# Patient Record
Sex: Male | Born: 1940
Health system: Southern US, Community
[De-identification: ages and names within clinical notes are randomized; demographics above are authoritative.]

## PROBLEM LIST (undated history)

## (undated) DIAGNOSIS — Z87442 Personal history of urinary calculi: Secondary | ICD-10-CM

## (undated) DIAGNOSIS — I4819 Other persistent atrial fibrillation: Secondary | ICD-10-CM

## (undated) DIAGNOSIS — M1990 Unspecified osteoarthritis, unspecified site: Secondary | ICD-10-CM

## (undated) DIAGNOSIS — C9210 Chronic myeloid leukemia, BCR/ABL-positive, not having achieved remission: Secondary | ICD-10-CM

## (undated) DIAGNOSIS — I499 Cardiac arrhythmia, unspecified: Secondary | ICD-10-CM

## (undated) DIAGNOSIS — I10 Essential (primary) hypertension: Secondary | ICD-10-CM

## (undated) DIAGNOSIS — N200 Calculus of kidney: Secondary | ICD-10-CM

## (undated) HISTORY — DX: Other persistent atrial fibrillation: I48.19

## (undated) HISTORY — PX: OTHER SURGICAL HISTORY: SHX169

## (undated) HISTORY — PX: KNEE ARTHROSCOPY: SUR90

## (undated) HISTORY — DX: Essential (primary) hypertension: I10

## (undated) HISTORY — PX: COLONOSCOPY: SHX174

## (undated) HISTORY — DX: Chronic myeloid leukemia, BCR/ABL-positive, not having achieved remission: C92.10

---

## 2002-07-26 ENCOUNTER — Encounter: Payer: Self-pay | Admitting: Emergency Medicine

## 2002-07-26 ENCOUNTER — Emergency Department (HOSPITAL_COMMUNITY): Admission: EM | Admit: 2002-07-26 | Discharge: 2002-07-27 | Payer: Self-pay | Admitting: Emergency Medicine

## 2005-05-18 ENCOUNTER — Ambulatory Visit: Payer: Self-pay | Admitting: Internal Medicine

## 2005-05-29 ENCOUNTER — Encounter (INDEPENDENT_AMBULATORY_CARE_PROVIDER_SITE_OTHER): Payer: Self-pay | Admitting: Specialist

## 2005-05-29 ENCOUNTER — Ambulatory Visit: Payer: Self-pay | Admitting: Internal Medicine

## 2008-06-27 ENCOUNTER — Ambulatory Visit: Payer: Self-pay | Admitting: Internal Medicine

## 2008-07-10 ENCOUNTER — Encounter: Payer: Self-pay | Admitting: Internal Medicine

## 2008-07-10 ENCOUNTER — Ambulatory Visit: Payer: Self-pay | Admitting: Internal Medicine

## 2008-07-11 ENCOUNTER — Encounter: Payer: Self-pay | Admitting: Internal Medicine

## 2010-05-04 ENCOUNTER — Encounter: Admission: RE | Admit: 2010-05-04 | Discharge: 2010-05-04 | Payer: Self-pay | Admitting: Orthopedic Surgery

## 2013-06-28 ENCOUNTER — Encounter: Payer: Self-pay | Admitting: Gastroenterology

## 2013-11-16 ENCOUNTER — Encounter: Payer: Self-pay | Admitting: Internal Medicine

## 2013-12-16 ENCOUNTER — Inpatient Hospital Stay (HOSPITAL_COMMUNITY)
Admission: EM | Admit: 2013-12-16 | Discharge: 2013-12-20 | DRG: 287 | Disposition: A | Discharge status: Home / Self Care | Payer: Medicare HMO | Attending: Cardiology | Admitting: Cardiology

## 2013-12-16 ENCOUNTER — Emergency Department (HOSPITAL_COMMUNITY): Payer: Medicare HMO

## 2013-12-16 ENCOUNTER — Ambulatory Visit (INDEPENDENT_AMBULATORY_CARE_PROVIDER_SITE_OTHER): Payer: Medicare HMO | Admitting: Internal Medicine

## 2013-12-16 ENCOUNTER — Encounter (HOSPITAL_COMMUNITY): Payer: Self-pay | Admitting: Emergency Medicine

## 2013-12-16 VITALS — BP 130/76 | HR 73 | Temp 97.8°F | Resp 20 | Ht 73.0 in | Wt 249.0 lb

## 2013-12-16 DIAGNOSIS — I4891 Unspecified atrial fibrillation: Principal | ICD-10-CM | POA: Diagnosis present

## 2013-12-16 DIAGNOSIS — I10 Essential (primary) hypertension: Secondary | ICD-10-CM | POA: Diagnosis present

## 2013-12-16 DIAGNOSIS — I959 Hypotension, unspecified: Secondary | ICD-10-CM | POA: Diagnosis not present

## 2013-12-16 DIAGNOSIS — T46905A Adverse effect of unspecified agents primarily affecting the cardiovascular system, initial encounter: Secondary | ICD-10-CM | POA: Diagnosis not present

## 2013-12-16 DIAGNOSIS — Z87891 Personal history of nicotine dependence: Secondary | ICD-10-CM

## 2013-12-16 DIAGNOSIS — I499 Cardiac arrhythmia, unspecified: Secondary | ICD-10-CM

## 2013-12-16 DIAGNOSIS — Z881 Allergy status to other antibiotic agents status: Secondary | ICD-10-CM

## 2013-12-16 DIAGNOSIS — I2510 Atherosclerotic heart disease of native coronary artery without angina pectoris: Secondary | ICD-10-CM | POA: Diagnosis present

## 2013-12-16 DIAGNOSIS — Z7982 Long term (current) use of aspirin: Secondary | ICD-10-CM

## 2013-12-16 DIAGNOSIS — D696 Thrombocytopenia, unspecified: Secondary | ICD-10-CM | POA: Diagnosis present

## 2013-12-16 DIAGNOSIS — F1010 Alcohol abuse, uncomplicated: Secondary | ICD-10-CM | POA: Diagnosis present

## 2013-12-16 DIAGNOSIS — Z8249 Family history of ischemic heart disease and other diseases of the circulatory system: Secondary | ICD-10-CM

## 2013-12-16 HISTORY — DX: Calculus of kidney: N20.0

## 2013-12-16 LAB — CBC WITH DIFFERENTIAL/PLATELET
BASOS ABS: 0 10*3/uL (ref 0.0–0.1)
BASOS PCT: 0 % (ref 0–1)
Basophils Absolute: 0.1 10*3/uL (ref 0.0–0.1)
Basophils Relative: 0 % (ref 0–1)
EOS PCT: 3 % (ref 0–5)
Eosinophils Absolute: 0.3 10*3/uL (ref 0.0–0.7)
Eosinophils Absolute: 0.4 10*3/uL (ref 0.0–0.7)
Eosinophils Relative: 3 % (ref 0–5)
HEMATOCRIT: 42.2 % (ref 39.0–52.0)
HEMATOCRIT: 45 % (ref 39.0–52.0)
HEMOGLOBIN: 15.8 g/dL (ref 13.0–17.0)
Hemoglobin: 14.8 g/dL (ref 13.0–17.0)
LYMPHS PCT: 29 % (ref 12–46)
Lymphocytes Relative: 38 % (ref 12–46)
Lymphs Abs: 3.4 10*3/uL (ref 0.7–4.0)
Lymphs Abs: 4.8 10*3/uL — ABNORMAL HIGH (ref 0.7–4.0)
MCH: 31.4 pg (ref 26.0–34.0)
MCH: 31.4 pg (ref 26.0–34.0)
MCHC: 35.1 g/dL (ref 30.0–36.0)
MCHC: 35.1 g/dL (ref 30.0–36.0)
MCV: 89.6 fL (ref 78.0–100.0)
MCV: 89.5 fL (ref 78.0–100.0)
MONO ABS: 0.8 10*3/uL (ref 0.1–1.0)
MONO ABS: 0.8 10*3/uL (ref 0.1–1.0)
MONOS PCT: 6 % (ref 3–12)
Monocytes Relative: 6 % (ref 3–12)
NEUTROS PCT: 62 % (ref 43–77)
Neutro Abs: 7.4 10*3/uL (ref 1.7–7.7)
Neutro Abs: 6.7 10*3/uL (ref 1.7–7.7)
Neutrophils Relative %: 52 % (ref 43–77)
Platelets: 209 10*3/uL (ref 150–400)
Platelets: 294 10*3/uL (ref 150–400)
RBC: 4.71 MIL/uL (ref 4.22–5.81)
RBC: 5.03 MIL/uL (ref 4.22–5.81)
RDW: 12.9 % (ref 11.5–15.5)
RDW: 12.9 % (ref 11.5–15.5)
WBC: 11.9 10*3/uL — AB (ref 4.0–10.5)
WBC: 12.7 10*3/uL — ABNORMAL HIGH (ref 4.0–10.5)

## 2013-12-16 LAB — I-STAT CHEM 8, ED
BUN: 28 mg/dL — ABNORMAL HIGH (ref 6–23)
CHLORIDE: 104 meq/L (ref 96–112)
CREATININE: 1.2 mg/dL (ref 0.50–1.35)
Calcium, Ion: 1.19 mmol/L (ref 1.13–1.30)
Glucose, Bld: 93 mg/dL (ref 70–99)
HCT: 46 % (ref 39.0–52.0)
Hemoglobin: 15.6 g/dL (ref 13.0–17.0)
Potassium: 4 mEq/L (ref 3.7–5.3)
Sodium: 143 mEq/L (ref 137–147)
TCO2: 24 mmol/L (ref 0–100)

## 2013-12-16 LAB — COMPREHENSIVE METABOLIC PANEL
ALT: 29 U/L (ref 0–53)
AST: 19 U/L (ref 0–37)
Albumin: 4.2 g/dL (ref 3.5–5.2)
Alkaline Phosphatase: 62 U/L (ref 39–117)
BUN: 26 mg/dL — AB (ref 6–23)
CO2: 25 mEq/L (ref 19–32)
Calcium: 9.5 mg/dL (ref 8.4–10.5)
Chloride: 102 mEq/L (ref 96–112)
Creatinine, Ser: 1.04 mg/dL (ref 0.50–1.35)
GFR calc non Af Amer: 70 mL/min — ABNORMAL LOW (ref >90)
GFR, EST AFRICAN AMERICAN: 81 mL/min — AB (ref >90)
Glucose, Bld: 113 mg/dL — ABNORMAL HIGH (ref 70–99)
Potassium: 4 mEq/L (ref 3.7–5.3)
Sodium: 140 mEq/L (ref 137–147)
TOTAL PROTEIN: 8.1 g/dL (ref 6.0–8.3)
Total Bilirubin: 0.4 mg/dL (ref 0.3–1.2)

## 2013-12-16 LAB — PROTIME-INR
INR: 1.05 (ref 0.00–1.49)
Prothrombin Time: 13.5 seconds (ref 11.6–15.2)

## 2013-12-16 LAB — APTT: aPTT: 65 seconds — ABNORMAL HIGH (ref 24–37)

## 2013-12-16 LAB — I-STAT TROPONIN, ED: Troponin i, poc: 0.03 ng/mL (ref 0.00–0.08)

## 2013-12-16 LAB — TROPONIN I: Troponin I: <0.3 ng/mL (ref <0.30)

## 2013-12-16 LAB — GLUCOSE, CAPILLARY: GLUCOSE-CAPILLARY: 87 mg/dL (ref 70–99)

## 2013-12-16 MED ORDER — HEPARIN BOLUS VIA INFUSION
4000.0000 [IU] | Freq: Once | INTRAVENOUS | Status: AC
  Administered 2013-12-16: 4000 [IU] via INTRAVENOUS
  Filled 2013-12-16: qty 4000

## 2013-12-16 MED ORDER — ASPIRIN 300 MG RE SUPP
300.0000 mg | RECTAL | Status: AC
  Filled 2013-12-16: qty 1

## 2013-12-16 MED ORDER — SODIUM CHLORIDE 0.9 % IV SOLN
INTRAVENOUS | Status: DC
  Administered 2013-12-16: 23:00:00 via INTRAVENOUS
  Administered 2013-12-17: 75 mL/h via INTRAVENOUS
  Administered 2013-12-17 – 2013-12-18 (×2): via INTRAVENOUS

## 2013-12-16 MED ORDER — HEPARIN (PORCINE) IN NACL 100-0.45 UNIT/ML-% IJ SOLN
1650.0000 [IU]/h | INTRAMUSCULAR | Status: DC
  Administered 2013-12-16: 1300 [IU]/h via INTRAVENOUS
  Administered 2013-12-17 (×2): 1650 [IU]/h via INTRAVENOUS
  Filled 2013-12-16 (×4): qty 250

## 2013-12-16 MED ORDER — DILTIAZEM HCL 25 MG/5ML IV SOLN
20.0000 mg | Freq: Once | INTRAVENOUS | Status: AC
  Administered 2013-12-16: 20 mg via INTRAVENOUS
  Filled 2013-12-16: qty 5

## 2013-12-16 MED ORDER — METOPROLOL TARTRATE 25 MG PO TABS
25.0000 mg | ORAL_TABLET | Freq: Two times a day (BID) | ORAL | Status: DC
  Administered 2013-12-16 – 2013-12-17 (×3): 25 mg via ORAL
  Filled 2013-12-16 (×5): qty 1

## 2013-12-16 MED ORDER — ASPIRIN 81 MG PO TABS: 81.0000 mg | ORAL_TABLET | Freq: Every day | ORAL | Status: DC

## 2013-12-16 MED ORDER — NITROGLYCERIN 0.4 MG SL SUBL
0.4000 mg | SUBLINGUAL_TABLET | SUBLINGUAL | Status: DC | PRN
Start: 2013-12-16 — End: 2013-12-20

## 2013-12-16 MED ORDER — ASPIRIN 81 MG PO CHEW
324.0000 mg | CHEWABLE_TABLET | ORAL | Status: AC
  Administered 2013-12-16: 324 mg via ORAL
  Filled 2013-12-16: qty 4

## 2013-12-16 MED ORDER — ASPIRIN EC 81 MG PO TBEC
81.0000 mg | DELAYED_RELEASE_TABLET | Freq: Every day | ORAL | Status: DC
  Administered 2013-12-17 – 2013-12-18 (×2): 81 mg via ORAL
  Filled 2013-12-16 (×2): qty 1

## 2013-12-16 MED ORDER — PANTOPRAZOLE SODIUM 40 MG PO TBEC
40.0000 mg | DELAYED_RELEASE_TABLET | Freq: Every day | ORAL | Status: DC
  Administered 2013-12-17 – 2013-12-20 (×4): 40 mg via ORAL
  Filled 2013-12-16 (×4): qty 1

## 2013-12-16 MED ORDER — DEXTROSE 5 % IV SOLN
5.0000 mg/h | INTRAVENOUS | Status: DC
  Administered 2013-12-16: 5 mg/h via INTRAVENOUS
  Filled 2013-12-16 (×2): qty 100

## 2013-12-16 NOTE — ED Provider Notes (Signed)
CSN: [id number]     Arrival date & time 12/16/13  1750 History   First MD Initiated Contact with Patient 12/16/13 1812     Chief Complaint  Patient presents with  . Atrial Fibrillation     (Consider location/radiation/quality/duration/timing/severity/associated sxs/prior Treatment) HPI You develop lightheadedness onset yesterday. He denies any other complaint or any other specific symptoms he went to [facility] urgent care today was advised to come here to 2 "irregular heartbeat. He denies chest pain denies headache denies abdominal pain denies shortness breath no other symptoms nothing makes symptoms better or worse no treatment prior to coming here. No other associated symptoms Past Medical History  Diagnosis Date  . Hypertension   . Kidney stone    Past Surgical History  Procedure Laterality Date  . Knee dsurgery    . Knee arthroscopy     Family History  Problem Relation Age of Onset  . Heart disease Father   . Hypertension Sister    History  Substance Use Topics  . Smoking status: Former Smoker    Types: Pipe  . Smokeless tobacco: Never Used  . Alcohol Use: Yes     Comment: "1/5 a week"    Review of Systems  Constitutional: Negative.   HENT: Negative.   Respiratory: Negative.   Cardiovascular: Negative.   Gastrointestinal: Negative.   Musculoskeletal: Negative.   Skin: Negative.   Neurological: Positive for light-headedness.  Psychiatric/Behavioral: Negative.   All other systems reviewed and are negative.      Allergies  Amoxicillin  Home Medications   Current Outpatient Rx  Name  Route  Sig  Dispense  Refill  . amLODipine (NORVASC) 5 MG tablet   Oral   Take 5 mg by mouth daily.         [name] aspirin 81 MG tablet   Oral   Take 81 mg by mouth daily.         . clobetasol (TEMOVATE) 0.05 % external solution   Topical   Apply 1 application topically 2 (two) times daily.         [name] losartan-hydrochlorothiazide (HYZAAR) 100-12.5 MG per tablet    Oral   Take 1 tablet by mouth daily.         . naproxen sodium (ANAPROX) 220 MG tablet   Oral   Take 220 mg by mouth daily as needed (for pain).          BP 115/71  Pulse 61  Temp(Src) 98.2 F (36.8 C) (Oral)  Resp 0  SpO2 96% Physical Exam  Nursing note and vitals reviewed. Constitutional: He appears well-developed and well-nourished.  HENT:  Head: Normocephalic and atraumatic.  Eyes: Conjunctivae are normal. Pupils are equal, round, and reactive to light.  Neck: Neck supple. No tracheal deviation present. No thyromegaly present.  Cardiovascular:  No murmur heard. Irregularly irregular tachycardic  Pulmonary/Chest: Effort normal and breath sounds normal.  Abdominal: Soft. Bowel sounds are normal. He exhibits no distension. There is no tenderness.  Musculoskeletal: Normal range of motion. He exhibits no edema and no tenderness.  Neurological: He is alert. Coordination normal.  Skin: Skin is warm and dry. No rash noted.  Psychiatric: He has a normal mood and affect.    ED Course  Procedures (including critical care time) Labs Review Labs Reviewed  CBC WITH DIFFERENTIAL  I-STAT CHEM 8, ED  I-STAT TROPOININ, ED   Imaging Review No results found.  EKG Interpretation    Date/Time:  Saturday December 16 2013 18:17:21 EST Ventricular  Rate:  156 PR Interval:    QRS Duration: 95 QT Interval:  261 QTC Calculation: 420 R Axis:   66 Text Interpretation:  Atrial fibrillation with rapid V-rate Repolarization abnormality, prob rate related No old tracing to compare Confirmed by Eddie Payette  MD, Wilbern Pennypacker ([id number]) on 12/16/2013 6:51:32 PM           7:45 AM patient resting comfortably, asymptomatic. Heart rate is 80-85 beats per minute atrial fibrillation after treatment with intravenous Cardizem Results for orders placed during the hospital encounter of 12/16/13  CBC WITH DIFFERENTIAL      Result Value Ref Range   WBC 11.9 (*) 4.0 - 10.5 K/uL   RBC 4.71  4.22 - 5.81 MIL/uL    Hemoglobin 14.8  13.0 - 17.0 g/dL   HCT 42.2  39.0 - 52.0 %   MCV 89.6  78.0 - 100.0 fL   MCH 31.4  26.0 - 34.0 pg   MCHC 35.1  30.0 - 36.0 g/dL   RDW 12.9  11.5 - 15.5 %   Platelets 209  150 - 400 K/uL   Neutrophils Relative % 62  43 - 77 %   Neutro Abs 7.4  1.7 - 7.7 K/uL   Lymphocytes Relative 29  12 - 46 %   Lymphs Abs 3.4  0.7 - 4.0 K/uL   Monocytes Relative 6  3 - 12 %   Monocytes Absolute 0.8  0.1 - 1.0 K/uL   Eosinophils Relative 3  0 - 5 %   Eosinophils Absolute 0.3  0.0 - 0.7 K/uL   Basophils Relative 0  0 - 1 %   Basophils Absolute 0.0  0.0 - 0.1 K/uL  I-STAT CHEM 8, ED      Result Value Ref Range   Sodium 143  137 - 147 mEq/L   Potassium 4.0  3.7 - 5.3 mEq/L   Chloride 104  96 - 112 mEq/L   BUN 28 (*) 6 - 23 mg/dL   Creatinine, Ser 1.20  0.50 - 1.35 mg/dL   Glucose, Bld 93  70 - 99 mg/dL   Calcium, Ion 1.19  1.13 - 1.30 mmol/L   TCO2 24  0 - 100 mmol/L   Hemoglobin 15.6  13.0 - 17.0 g/dL   HCT 46.0  39.0 - 52.0 %  I-STAT TROPOININ, ED      Result Value Ref Range   Troponin i, poc 0.03  0.00 - 0.08 ng/mL   Comment 3            Dg Chest Portable 1 View  12/16/2013   CLINICAL DATA:  Atrial fibrillation, history hypertension  EXAM: PORTABLE CHEST - 1 VIEW  COMPARISON:  Portable exam 1884 hr without priors for comparison  FINDINGS: Upper normal heart size.  Mildly tortuous aorta.  Mediastinal contours and pulmonary vascularity normal.  Lungs clear.  No pleural effusion, pneumothorax or acute osseous findings.  IMPRESSION: No acute abnormalities.   Electronically Signed   By: Lavonia Dana M.D.   On: 12/16/2013 19:32  Chest xray viewed by me  MDM   Final diagnoses:  None   Dr.Dr  Terrence Dupont called to value patient in ED for admission. Intravenous heparin ordered per pharmacy protocol  Diagnosis atrial fibrillation, new onset with rapid ventricular response    Orlie Dakin, MD 12/16/13 2019

## 2013-12-16 NOTE — Progress Notes (Signed)
ANTICOAGULATION CONSULT NOTE - Initial Consult  Pharmacy Consult for heparin Indication: chest pain/ACS  Allergies  Allergen Reactions  . Amoxicillin     REACTION: whelps/pain in knees and ankles    Patient Measurements:   Heparin Dosing Weight: ~ 93 kg  Vital Signs: Temp: 98.2 F (36.8 C) (02/21 1753) Temp src: Oral (02/21 1753) BP: 119/71 mmHg (02/21 2000) Pulse Rate: 102 (02/21 2000)  Labs:  Recent Labs  12/16/13 1916 12/16/13 1926  HGB 14.8 15.6  HCT 42.2 46.0  PLT 209  --   CREATININE  --  1.20    The CrCl is unknown because both a height and weight (above a minimum accepted value) are required for this calculation.   Medical History: Past Medical History  Diagnosis Date  . Hypertension   . Kidney stone     Medications:  Scheduled:    Assessment: 73 yo male admitted with new onset afib with RVR, pharmacy asked to begin anticoagulation with IV heparin.  No anticoagulants noted PTA, and baseline CBC WNL.    Goal of Therapy:  Heparin level 0.3-0.7 units/ml Monitor platelets by anticoagulation protocol: Yes   Plan:  1. Begin IV heparin with bolus of 4000 units x 1. 2. Then start heparin gtt at rate of 1300 units/hr. 3. Check heparin level 6 hrs after gtt starts. 4. Daily heparin level and CBC.  Uvaldo Rising, BCPS  Clinical Pharmacist Pager [phone]  12/16/2013 8:25 PM

## 2013-12-16 NOTE — Progress Notes (Addendum)
Subjective:    Patient ID: Lance Hubbard, male    DOB: [birth year], 73 y.o.   MRN: [id number]  HPI This chart was scribed for Tami Lin, MD by Thea Alken, ED Scribe. This patient was seen in room 8 and the patient's care was started at 4:15 PM.  HPI Comments: Lance Hubbard is a 73 y.o. male who presents to the [facility] complaining of an irregular heart beat. Pt states that he feels like his heart is fluttering, and started this yesterday.. Pt reports that he has mild dizziness. His home blood pressures were below systolic of 657 yesterday evening and through the night and so he did not take his blood pressure medication this morning.  He denies CP,cough, diaphoresis, recent infection, any change in medication, difficulty sleeping and nausea.  Pt states that he has been on weight watchers and has lost 18lbs over the past 3 weeks  PCP- Woody Seller, MD  Past Medical History  Diagnosis Date   Hypertension    Allergies  Allergen Reactions   Amoxicillin     REACTION: whelps/pain in knees and ankles   Prior to Admission medications   Medication Sig Start Date End Date Taking? Authorizing Provider  amLODipine (NORVASC) 5 MG tablet Take 5 mg by mouth daily.   Yes Historical Provider, MD  aspirin 81 MG tablet Take 81 mg by mouth daily.   Yes Historical Provider, MD  losartan-hydrochlorothiazide (HYZAAR) 100-12.5 MG per tablet Take 1 tablet by mouth daily.   Yes Historical Provider, MD   Review of Systems  Constitutional: Negative for fever, chills, activity change, appetite change, fatigue and unexpected weight change.  HENT: Negative for trouble swallowing.   Eyes: Negative for photophobia and visual disturbance.  Respiratory: Negative for cough, choking, chest tightness, shortness of breath and wheezing.   Cardiovascular: Positive for palpitations. Negative for chest pain and leg swelling.  Gastrointestinal: Negative for nausea and abdominal pain.    Genitourinary: Negative for difficulty urinating.  Musculoskeletal: Negative for back pain and neck pain.  Skin: Negative for rash.  Neurological: Negative for tremors, numbness and headaches.  Psychiatric/Behavioral: Negative for confusion and sleep disturbance.       Objective:   Physical Exam  Nursing note and vitals reviewed. Constitutional: He is oriented to person, place, and time. He appears well-developed and well-nourished. No distress.  HENT:  Head: Normocephalic and atraumatic.  Eyes: Conjunctivae and EOM are normal. Pupils are equal, round, and reactive to light.  Neck: Normal range of motion. Neck supple. No thyromegaly present.  Cardiovascular: Normal rate and intact distal pulses.   Irregularly irregular rhythm with tachycardia No murmur  Pulmonary/Chest: Effort normal and breath sounds normal. No respiratory distress.  Musculoskeletal: Normal range of motion. He exhibits no edema.  Adequate peripheral pulses  Lymphadenopathy:    He has no cervical adenopathy.  Neurological: He is alert and oriented to person, place, and time. No cranial nerve deficit.  Skin: Skin is warm and dry.  Psychiatric: He has a normal mood and affect. His behavior is normal.   Filed Vitals:   12/16/13 1548  BP: 130/76--- blood pressure stable here off medications   Pulse: 73--up to 150 irregularly irregular   Temp: 97.8 F (36.6 C)  Resp: 20  EKG suggests atrial tachycardia with irregular response. Some areas that look like atrial fibrillation and some areas that look like type I block    Assessment & Plan:  I have completed the patient encounter  in its entirety as documented by the scribe, with editing by me where necessary. Robert P. Laney Pastor, M.D.  Problem #1 new onset supraventricular tachycardia suggesting atrial fibrillation  Associated with hypotension last night  Rapid ventricular response today  Will transport to the emergency room via EMS for definitive care/he is  stable at the time leaving the building

## 2013-12-16 NOTE — H&P (Signed)
Lance Hubbard is an 73 y.o. male.   Chief Complaint: Lightheadedness and generalized tired feeling associated with irregular heart beat and low blood pressure HPI: Patient is 73 year old male with past medical history significant for hypertension, severe tobacco abuse, history of alcohol abuse, was transferred from urgent care as patient was noted to be in A. fib with RVR. Patient states since yesterday afternoon he is having lightheadedness associated with generalized tired feeling and tired feeling in the neck and was noted to have irregular heart beat and low blood pressure. Patient denies any syncopal episode. Denies any chest pain nausea vomiting or diaphoresis. Denies any history of exertional chest pain although his history of exertional dyspnea. Patient went to urgent care today and was noted to have A. fib with RVR heart rate in the 170s to 180s with diffuse ST-T wave changes in anterolateral and inferior leads. Patient received 20 mg bolus of Cardizem with control of heart rate in one teens to 120s. Patient denies any cardiac workup in the past. Denies any thyroid problems. Denies any history of rheumatic fever. States he drinks hard liquor 3-5 drinks every few days. Patient denies as such feeling any palpitations.  Past Medical History  Diagnosis Date  . Hypertension   . Kidney stone     Past Surgical History  Procedure Laterality Date  . Knee dsurgery    . Knee arthroscopy      Family History  Problem Relation Age of Onset  . Heart disease Father   . Hypertension Sister    Social History:  reports that he has quit smoking. His smoking use included Pipe. He has never used smokeless tobacco. He reports that he drinks alcohol. He reports that he does not use illicit drugs.  Allergies:  Allergies  Allergen Reactions  . Amoxicillin     REACTION: whelps/pain in knees and ankles     (Not in a hospital admission)  Results for orders placed during the hospital encounter of  12/16/13 (from the past 48 hour(s))  CBC WITH DIFFERENTIAL     Status: Abnormal   Collection Time    12/16/13  7:16 PM      Result Value Ref Range   WBC 11.9 (*) 4.0 - 10.5 K/uL   RBC 4.71  4.22 - 5.81 MIL/uL   Hemoglobin 14.8  13.0 - 17.0 g/dL   HCT 42.2  39.0 - 52.0 %   MCV 89.6  78.0 - 100.0 fL   MCH 31.4  26.0 - 34.0 pg   MCHC 35.1  30.0 - 36.0 g/dL   RDW 12.9  11.5 - 15.5 %   Platelets 209  150 - 400 K/uL   Neutrophils Relative % 62  43 - 77 %   Neutro Abs 7.4  1.7 - 7.7 K/uL   Lymphocytes Relative 29  12 - 46 %   Lymphs Abs 3.4  0.7 - 4.0 K/uL   Monocytes Relative 6  3 - 12 %   Monocytes Absolute 0.8  0.1 - 1.0 K/uL   Eosinophils Relative 3  0 - 5 %   Eosinophils Absolute 0.3  0.0 - 0.7 K/uL   Basophils Relative 0  0 - 1 %   Basophils Absolute 0.0  0.0 - 0.1 K/uL  I-STAT TROPOININ, ED     Status: None   Collection Time    12/16/13  7:24 PM      Result Value Ref Range   Troponin i, poc 0.03  0.00 - 0.08 ng/mL  Comment 3            Comment: Due to the release kinetics of cTnI,     a negative result within the first hours     of the onset of symptoms does not rule out     myocardial infarction with certainty.     If myocardial infarction is still suspected,     repeat the test at appropriate intervals.  I-STAT CHEM 8, ED     Status: Abnormal   Collection Time    12/16/13  7:26 PM      Result Value Ref Range   Sodium 143  137 - 147 mEq/L   Potassium 4.0  3.7 - 5.3 mEq/L   Chloride 104  96 - 112 mEq/L   BUN 28 (*) 6 - 23 mg/dL   Creatinine, Ser 1.20  0.50 - 1.35 mg/dL   Glucose, Bld 93  70 - 99 mg/dL   Calcium, Ion 1.19  1.13 - 1.30 mmol/L   TCO2 24  0 - 100 mmol/L   Hemoglobin 15.6  13.0 - 17.0 g/dL   HCT 46.0  39.0 - 52.0 %   Dg Chest Portable 1 View  12/16/2013   CLINICAL DATA:  Atrial fibrillation, history hypertension  EXAM: PORTABLE CHEST - 1 VIEW  COMPARISON:  Portable exam 3016 hr without priors for comparison  FINDINGS: Upper normal heart size.  Mildly  tortuous aorta.  Mediastinal contours and pulmonary vascularity normal.  Lungs clear.  No pleural effusion, pneumothorax or acute osseous findings.  IMPRESSION: No acute abnormalities.   Electronically Signed   By: Lavonia Dana M.D.   On: 12/16/2013 19:32    Review of Systems  Constitutional: Negative for fever, chills and weight loss.  Eyes: Negative for blurred vision, double vision and photophobia.  Respiratory: Negative for cough, hemoptysis, sputum production and shortness of breath.   Cardiovascular: Negative for chest pain, palpitations, orthopnea and leg swelling.  Gastrointestinal: Negative for nausea, vomiting and abdominal pain.  Genitourinary: Negative for dysuria.  Neurological: Positive for dizziness. Negative for headaches.    Blood pressure 119/71, pulse 102, temperature 98.2 F (36.8 C), temperature source Oral, resp. rate 20, SpO2 97.00%. Physical Exam  Constitutional: He is oriented to person, place, and time.  HENT:  Head: Normocephalic and atraumatic.  Eyes: Conjunctivae are normal. Pupils are equal, round, and reactive to light. Left eye exhibits no discharge. No scleral icterus.  Neck: Normal range of motion. Neck supple. No JVD present. No tracheal deviation present. No thyromegaly present.  Cardiovascular:  Irregularly irregular S1 and S2 soft tachycardic  Respiratory: Effort normal and breath sounds normal. No respiratory distress. He has no wheezes. He has no rales.  GI: Soft. Bowel sounds are normal. He exhibits no distension. There is no tenderness. There is no rebound and no guarding.  Musculoskeletal: He exhibits no edema and no tenderness.  Neurological: He is alert and oriented to person, place, and time.     Assessment/Plan A. fib with RVR Exertional dyspnea/tired feeling in the neck rule out coronary insufficiency Hypertension History of alcohol abuse History of tobacco abuse Plan As per orders  Discussed with patient and his wife at length  regarding rate control versus rhythm control with TEE cardioversion as duration of atrial fibrillation is not 100% clear and nuclear stress test and consents for it.  Parry Po N 12/16/2013, 9:00 PM

## 2013-12-16 NOTE — ED Notes (Signed)
Pt from UC via [facility] c/o new onset a-fib.  Pt states he took his BP yesterday, noted it was 90/58 and an irregular HR, after being dizzy.  Pt waited until today to go to UC where the EKG showed a-fib, HR 150s-160s.  Pt has been asymptomatic today but something "feels off."  No BP meds since yesterday morning.  Pt in NAD, A&O.

## 2013-12-17 ENCOUNTER — Inpatient Hospital Stay (HOSPITAL_COMMUNITY): Payer: Medicare HMO

## 2013-12-17 LAB — TROPONIN I: Troponin I: <0.3 ng/mL (ref <0.30)

## 2013-12-17 LAB — CBC
HCT: 39.1 % (ref 39.0–52.0)
HEMOGLOBIN: 13.3 g/dL (ref 13.0–17.0)
MCH: 30.9 pg (ref 26.0–34.0)
MCHC: 34 g/dL (ref 30.0–36.0)
MCV: 90.7 fL (ref 78.0–100.0)
Platelets: 208 10*3/uL (ref 150–400)
RBC: 4.31 MIL/uL (ref 4.22–5.81)
RDW: 13 % (ref 11.5–15.5)
WBC: 9.7 10*3/uL (ref 4.0–10.5)

## 2013-12-17 LAB — BASIC METABOLIC PANEL
BUN: 27 mg/dL — ABNORMAL HIGH (ref 6–23)
CALCIUM: 8.6 mg/dL (ref 8.4–10.5)
CO2: 25 meq/L (ref 19–32)
Chloride: 105 mEq/L (ref 96–112)
Creatinine, Ser: 1.04 mg/dL (ref 0.50–1.35)
GFR calc Af Amer: 81 mL/min — ABNORMAL LOW (ref >90)
GFR calc non Af Amer: 70 mL/min — ABNORMAL LOW (ref >90)
Glucose, Bld: 91 mg/dL (ref 70–99)
Potassium: 4 mEq/L (ref 3.7–5.3)
SODIUM: 140 meq/L (ref 137–147)

## 2013-12-17 LAB — TSH: TSH: 2.478 u[IU]/mL (ref 0.350–4.500)

## 2013-12-17 LAB — LIPID PANEL
CHOL/HDL RATIO: 3.7 ratio
Cholesterol: 122 mg/dL (ref 0–200)
HDL: 33 mg/dL — ABNORMAL LOW (ref >39)
LDL CALC: 70 mg/dL (ref 0–99)
Triglycerides: 94 mg/dL (ref <150)
VLDL: 19 mg/dL (ref 0–40)

## 2013-12-17 LAB — HEPARIN LEVEL (UNFRACTIONATED)
HEPARIN UNFRACTIONATED: 0.31 [IU]/mL (ref 0.30–0.70)
Heparin Unfractionated: 0.11 IU/mL — ABNORMAL LOW (ref 0.30–0.70)

## 2013-12-17 MED ORDER — HEPARIN (PORCINE) IN NACL 100-0.45 UNIT/ML-% IJ SOLN
1750.0000 [IU]/h | INTRAMUSCULAR | Status: DC
  Administered 2013-12-17 – 2013-12-18 (×3): 1750 [IU]/h via INTRAVENOUS
  Filled 2013-12-17 (×4): qty 250

## 2013-12-17 MED ORDER — HEPARIN BOLUS VIA INFUSION
2000.0000 [IU] | Freq: Once | INTRAVENOUS | Status: AC
  Administered 2013-12-17: 2000 [IU] via INTRAVENOUS
  Filled 2013-12-17: qty 2000

## 2013-12-17 MED ORDER — DIGOXIN 125 MCG PO TABS
0.1250 mg | ORAL_TABLET | Freq: Every day | ORAL | Status: DC
  Administered 2013-12-17 – 2013-12-18 (×2): 0.125 mg via ORAL
  Filled 2013-12-17 (×4): qty 1

## 2013-12-17 MED ORDER — TECHNETIUM TC 99M SESTAMIBI - CARDIOLITE
10.0000 | Freq: Once | INTRAVENOUS | Status: AC | PRN
  Administered 2013-12-17: 30 via INTRAVENOUS

## 2013-12-17 MED ORDER — TECHNETIUM TC 99M SESTAMIBI - CARDIOLITE
10.0000 | Freq: Once | INTRAVENOUS | Status: AC | PRN
  Administered 2013-12-17: 10 via INTRAVENOUS

## 2013-12-17 MED ORDER — REGADENOSON 0.4 MG/5ML IV SOLN
0.4000 mg | Freq: Once | INTRAVENOUS | Status: AC
  Administered 2013-12-17: 0.4 mg via INTRAVENOUS
  Filled 2013-12-17: qty 5

## 2013-12-17 MED ORDER — OFF THE BEAT BOOK
Freq: Once | Status: AC
  Administered 2013-12-17: 12:00:00
  Filled 2013-12-17: qty 1

## 2013-12-17 MED ORDER — REGADENOSON 0.4 MG/5ML IV SOLN
INTRAVENOUS | Status: AC
  Administered 2013-12-17: 0.4 mg via INTRAVENOUS
  Filled 2013-12-17: qty 5

## 2013-12-17 MED ORDER — DILTIAZEM HCL 100 MG IV SOLR
5.0000 mg/h | INTRAVENOUS | Status: DC
  Administered 2013-12-17: 5 mg/h via INTRAVENOUS
  Filled 2013-12-17 (×2): qty 100

## 2013-12-17 MED ORDER — DIGOXIN 0.25 MG/ML IJ SOLN: 0.2500 mg | INTRAMUSCULAR | Status: DC

## 2013-12-17 MED ORDER — DIGOXIN 0.05 MG/ML PO SOLN
0.2500 mg | ORAL | Status: AC
  Administered 2013-12-17 (×2): 0.25 mg via ORAL
  Filled 2013-12-17 (×3): qty 5

## 2013-12-17 MED ORDER — SODIUM CHLORIDE 0.9 % IV BOLUS (SEPSIS)
250.0000 mL | Freq: Once | INTRAVENOUS | Status: AC
Start: 2013-12-17 — End: 2013-12-17
  Administered 2013-12-17: 250 mL via INTRAVENOUS

## 2013-12-17 NOTE — Progress Notes (Signed)
Pt admitted with AFIB RVR and on Cardizem gtt, MD paged due to BP < 90 and HR <65 and Patient in AFIB rhythm. Order received from Dr Vilinda Boehringer to DC Cardizem gtt, and Give a bolus of Normal Saline 224ml. Order also received to Restart Cardizem gtt if HR >100. Will continue plan of care.

## 2013-12-17 NOTE — Progress Notes (Signed)
Subjective:  Patient denies any chest pain shortness of breath or palpitations. Remains in A. fib with moderate ventricular response. Cardizem drip need to be DC'd due to hypotension  Objective:  Vital Signs in the last 24 hours: Temp:  [97.5 F (36.4 C)-98.2 F (36.8 C)] 97.5 F (36.4 C) (02/22 0500) Pulse Rate:  [51-140] 78 (02/22 0500) Resp:  [0-21] 17 (02/22 0500) BP: (85-130)/(42-101) 96/63 mmHg (02/22 0500) SpO2:  [94 %-98 %] 97 % (02/22 0500) Weight:  [111.3 kg (245 lb 6 oz)-112.946 kg (249 lb)] 111.3 kg (245 lb 6 oz) (02/21 2202)  Intake/Output from previous day:   Intake/Output from this shift:    Physical Exam: Neck: no adenopathy, no carotid bruit, no JVD and supple, symmetrical, trachea midline Lungs: clear to auscultation bilaterally Heart: irregularly irregular rhythm, S1, S2 normal and Soft systolic murmur noted Abdomen: soft, non-tender; bowel sounds normal; no masses,  no organomegaly Extremities: extremities normal, atraumatic, no cyanosis or edema  Lab Results:  Recent Labs  12/16/13 2232 12/17/13 0610  WBC 12.7* 9.7  HGB 15.8 13.3  PLT 294 208    Recent Labs  12/16/13 2232 12/17/13 0610  NA 140 140  K 4.0 4.0  CL 102 105  CO2 25 25  GLUCOSE 113* 91  BUN 26* 27*  CREATININE 1.04 1.04    Recent Labs  12/16/13 2232 12/17/13 0610  TROPONINI <0.30 <0.30   Hepatic Function Panel  Recent Labs  12/16/13 2232  PROT 8.1  ALBUMIN 4.2  AST 19  ALT 29  ALKPHOS 62  BILITOT 0.4    Recent Labs  12/17/13 0610  CHOL 122   No results found for this basename: PROTIME,  in the last 72 hours  Imaging: Imaging results have been reviewed and Dg Chest Portable 1 View  12/16/2013   CLINICAL DATA:  Atrial fibrillation, history hypertension  EXAM: PORTABLE CHEST - 1 VIEW  COMPARISON:  Portable exam 1858 hr without priors for comparison  FINDINGS: Upper normal heart size.  Mildly tortuous aorta.  Mediastinal contours and pulmonary vascularity  normal.  Lungs clear.  No pleural effusion, pneumothorax or acute osseous findings.  IMPRESSION: No acute abnormalities.   Electronically Signed   By: Lavonia Dana M.D.   On: 12/16/2013 19:32    Cardiac Studies:  Assessment/Plan:  A. fib with RVR  Exertional dyspnea/tired feeling in the neck rule out coronary insufficiency  Hypertension  History of alcohol abuse  History of tobacco abuse  Plan Schedule for nuclear stress test today Add digoxin As per orders Was scheduled for TEE assisted cardioversion tomorrow   LOS: 1 day    Mohannad Olivero N 12/17/2013, 10:59 AM

## 2013-12-17 NOTE — Progress Notes (Signed)
[place] for heparin Indication: chest pain/ACS  Allergies  Allergen Reactions  . Amoxicillin     REACTION: whelps/pain in knees and ankles    Patient Measurements: Height: 6\' 2"  (188 cm) Weight: 245 lb 6 oz (111.3 kg) IBW/kg (Calculated) : 82.2 Heparin Dosing Weight: 105 kg  Vital Signs: Temp: 97.5 F (36.4 C) (02/22 0500) Temp src: Oral (02/22 0500) BP: 96/63 mmHg (02/22 0500) Pulse Rate: 78 (02/22 0500)  Labs:  Recent Labs  12/16/13 1916 12/16/13 1926 12/16/13 2232 12/17/13 0610  HGB 14.8 15.6 15.8 13.3  HCT 42.2 46.0 45.0 39.1  PLT 209  --  294 208  APTT  --   --  65*  --   LABPROT  --   --  13.5  --   INR  --   --  1.05  --   HEPARINUNFRC  --   --   --  0.11*  CREATININE  --  1.20 1.04 1.04  TROPONINI  --   --  <0.30 <0.30    Estimated Creatinine Clearance: 86.4 ml/min (by C-G formula based on Cr of 1.04).  Assessment: 73 yo male with afib for heparin Goal of Therapy:  Heparin level 0.3-0.7 units/ml Monitor platelets by anticoagulation protocol: Yes   Plan:  Heparin 2000 units IV bolus, then increase heparin 1650 units/hr Check heparin level in 6 hours.  Phillis Knack, PharmD, BCPS   12/17/2013 7:48 AM

## 2013-12-17 NOTE — Progress Notes (Addendum)
Patient heart rate remaiging in the 120-140-150ss with burst of 140s-150 atrial fib more frequently on monitor (non sustaining) Dr Terrence Dupont made aware and orders received will continue to monitor patient. Raeshawn Vo, Bettina Gavia RN

## 2013-12-17 NOTE — Progress Notes (Signed)
Lance Hubbard for heparin Indication: chest pain/ACS  Allergies  Allergen Reactions  . Amoxicillin     REACTION: whelps/pain in knees and ankles    Patient Measurements: Height: 6\' 2"  (188 cm) Weight: 245 lb 6 oz (111.3 kg) IBW/kg (Calculated) : 82.2 Heparin Dosing Weight: 105 kg  Vital Signs: Temp: 97.4 F (36.3 C) (02/22 1440) Temp src: Oral (02/22 1440) BP: 112/78 mmHg (02/22 1440) Pulse Rate: 96 (02/22 1440)  Labs:  Recent Labs  12/16/13 1916 12/16/13 1926 12/16/13 2232 12/17/13 0610 12/17/13 1420  HGB 14.8 15.6 15.8 13.3  --   HCT 42.2 46.0 45.0 39.1  --   PLT 209  --  294 208  --   APTT  --   --  65*  --   --   LABPROT  --   --  13.5  --   --   INR  --   --  1.05  --   --   HEPARINUNFRC  --   --   --  0.11* 0.31  CREATININE  --  1.20 1.04 1.04  --   TROPONINI  --   --  <0.30 <0.30 <0.30    Estimated Creatinine Clearance: 86.4 ml/min (by C-G formula based on Cr of 1.04).  Assessment: 73 yo male with afib for heparin.  Heparin level still at lower end of range despite rate increase earlier this AM.  NO bleeding or complications noted per chart notes.    Goal of Therapy:  Heparin level 0.3-0.7 units/ml Monitor platelets by anticoagulation protocol: Yes   Plan:  Increase IV heparin to 1750 units/hr. Recheck heparin level with AM labs. Continue daily heparin level and CBC.  Uvaldo Rising, BCPS  Clinical Pharmacist Pager [phone]  12/17/2013 3:15 PM

## 2013-12-17 NOTE — Progress Notes (Signed)
Nursing Note  Dr. Terrence Dupont made aware of pts Myoview/cardiolite results. Also made aware of patients heart rate and blood pressure heart rate ranging from 90s-120 on monitor, some bursts with higher rate but does not sustain, pt in Atrial fib. bp 112/76. Will continue to monitor patient. Lance Hubbard, Bettina Gavia RN

## 2013-12-17 NOTE — Progress Notes (Signed)
  Echocardiogram 2D Echocardiogram has been performed.  Lance Hubbard 12/17/2013, 5:00 PM

## 2013-12-18 ENCOUNTER — Inpatient Hospital Stay (HOSPITAL_COMMUNITY): Payer: Medicare HMO | Admitting: Anesthesiology

## 2013-12-18 ENCOUNTER — Encounter (HOSPITAL_COMMUNITY): Payer: Self-pay | Admitting: *Deleted

## 2013-12-18 ENCOUNTER — Encounter (HOSPITAL_COMMUNITY)
Admission: EM | Disposition: A | Discharge status: Home / Self Care | Payer: Medicare HMO | Source: Home / Self Care | Attending: Cardiology

## 2013-12-18 ENCOUNTER — Encounter (HOSPITAL_COMMUNITY): Payer: Medicare HMO | Admitting: Anesthesiology

## 2013-12-18 HISTORY — PX: TEE WITHOUT CARDIOVERSION: SHX5443

## 2013-12-18 HISTORY — PX: CARDIOVERSION: SHX1299

## 2013-12-18 LAB — CBC
HCT: 37.9 % — ABNORMAL LOW (ref 39.0–52.0)
Hemoglobin: 12.7 g/dL — ABNORMAL LOW (ref 13.0–17.0)
MCH: 30.5 pg (ref 26.0–34.0)
MCHC: 33.5 g/dL (ref 30.0–36.0)
MCV: 91.1 fL (ref 78.0–100.0)
Platelets: 183 10*3/uL (ref 150–400)
RBC: 4.16 MIL/uL — ABNORMAL LOW (ref 4.22–5.81)
RDW: 13 % (ref 11.5–15.5)
WBC: 8.4 10*3/uL (ref 4.0–10.5)

## 2013-12-18 LAB — HEPARIN LEVEL (UNFRACTIONATED): HEPARIN UNFRACTIONATED: 0.38 [IU]/mL (ref 0.30–0.70)

## 2013-12-18 SURGERY — ECHOCARDIOGRAM, TRANSESOPHAGEAL
Anesthesia: General

## 2013-12-18 MED ORDER — BUTAMBEN-TETRACAINE-BENZOCAINE 2-2-14 % EX AERO
INHALATION_SPRAY | CUTANEOUS | Status: AC
  Filled 2013-12-18: qty 56

## 2013-12-18 MED ORDER — AMIODARONE HCL 200 MG PO TABS
200.0000 mg | ORAL_TABLET | Freq: Two times a day (BID) | ORAL | Status: DC
  Administered 2013-12-18 – 2013-12-20 (×5): 200 mg via ORAL
  Filled 2013-12-18 (×7): qty 1

## 2013-12-18 MED ORDER — ASPIRIN EC 81 MG PO TBEC
81.0000 mg | DELAYED_RELEASE_TABLET | Freq: Every day | ORAL | Status: DC
  Filled 2013-12-18: qty 1

## 2013-12-18 MED ORDER — MIDAZOLAM HCL 5 MG/ML IJ SOLN
INTRAMUSCULAR | Status: AC
  Filled 2013-12-18: qty 2

## 2013-12-18 MED ORDER — MIDAZOLAM HCL 10 MG/2ML IJ SOLN
INTRAMUSCULAR | Status: DC | PRN
  Administered 2013-12-18 (×2): 2 mg via INTRAVENOUS
  Administered 2013-12-18: 1 mg via INTRAVENOUS

## 2013-12-18 MED ORDER — PROPOFOL 10 MG/ML IV BOLUS
INTRAVENOUS | Status: DC | PRN
  Administered 2013-12-18: 40 mg via INTRAVENOUS

## 2013-12-18 MED ORDER — SODIUM CHLORIDE 0.9 % IJ SOLN: 3.0000 mL | INTRAMUSCULAR | Status: DC | PRN

## 2013-12-18 MED ORDER — DIAZEPAM 5 MG PO TABS
5.0000 mg | ORAL_TABLET | ORAL | Status: AC
  Administered 2013-12-19: 5 mg via ORAL
  Filled 2013-12-18: qty 1

## 2013-12-18 MED ORDER — ASPIRIN 81 MG PO CHEW
81.0000 mg | CHEWABLE_TABLET | ORAL | Status: AC
  Administered 2013-12-19: 81 mg via ORAL
  Filled 2013-12-18: qty 1

## 2013-12-18 MED ORDER — METOPROLOL TARTRATE 12.5 MG HALF TABLET
12.5000 mg | ORAL_TABLET | Freq: Two times a day (BID) | ORAL | Status: DC
  Administered 2013-12-18 – 2013-12-19 (×2): 12.5 mg via ORAL
  Filled 2013-12-18 (×5): qty 1

## 2013-12-18 MED ORDER — FENTANYL CITRATE 0.05 MG/ML IJ SOLN
INTRAMUSCULAR | Status: AC
  Filled 2013-12-18: qty 2

## 2013-12-18 MED ORDER — SODIUM CHLORIDE 0.9 % IJ SOLN
3.0000 mL | Freq: Two times a day (BID) | INTRAMUSCULAR | Status: DC
  Administered 2013-12-18: 3 mL via INTRAVENOUS

## 2013-12-18 MED ORDER — FENTANYL CITRATE 0.05 MG/ML IJ SOLN
INTRAMUSCULAR | Status: DC | PRN
  Administered 2013-12-18 (×2): 25 ug via INTRAVENOUS

## 2013-12-18 MED ORDER — SODIUM CHLORIDE 0.9 % IV SOLN
INTRAVENOUS | Status: DC
  Administered 2013-12-18: 500 mL via INTRAVENOUS

## 2013-12-18 MED ORDER — SODIUM CHLORIDE 0.9 % IV SOLN
1.0000 mL/kg/h | INTRAVENOUS | Status: DC
  Administered 2013-12-19: 1 mL/kg/h via INTRAVENOUS

## 2013-12-18 MED ORDER — BUTAMBEN-TETRACAINE-BENZOCAINE 2-2-14 % EX AERO
INHALATION_SPRAY | CUTANEOUS | Status: DC | PRN
  Administered 2013-12-18: 2 via TOPICAL

## 2013-12-18 MED ORDER — SODIUM CHLORIDE 0.9 % IV SOLN
250.0000 mL | INTRAVENOUS | Status: DC | PRN
Start: 2013-12-18 — End: 2013-12-19

## 2013-12-18 MED ORDER — LIDOCAINE HCL (CARDIAC) 20 MG/ML IV SOLN
INTRAVENOUS | Status: DC | PRN
  Administered 2013-12-18: 40 mg via INTRAVENOUS

## 2013-12-18 NOTE — H&P (View-Only) (Signed)
Subjective:  Patient denies any chest pain shortness of breath or palpitations. Remains in A. fib with moderate ventricular response. Cardizem drip need to be DC'd due to hypotension  Objective:  Vital Signs in the last 24 hours: Temp:  [97.5 F (36.4 C)-98.2 F (36.8 C)] 97.5 F (36.4 C) (02/22 0500) Pulse Rate:  [51-140] 78 (02/22 0500) Resp:  [0-21] 17 (02/22 0500) BP: (85-130)/(42-101) 96/63 mmHg (02/22 0500) SpO2:  [94 %-98 %] 97 % (02/22 0500) Weight:  [111.3 kg (245 lb 6 oz)-112.946 kg (249 lb)] 111.3 kg (245 lb 6 oz) (02/21 2202)  Intake/Output from previous day:   Intake/Output from this shift:    Physical Exam: Neck: no adenopathy, no carotid bruit, no JVD and supple, symmetrical, trachea midline Lungs: clear to auscultation bilaterally Heart: irregularly irregular rhythm, S1, S2 normal and Soft systolic murmur noted Abdomen: soft, non-tender; bowel sounds normal; no masses,  no organomegaly Extremities: extremities normal, atraumatic, no cyanosis or edema  Lab Results:  Recent Labs  12/16/13 2232 12/17/13 0610  WBC 12.7* 9.7  HGB 15.8 13.3  PLT 294 208    Recent Labs  12/16/13 2232 12/17/13 0610  NA 140 140  K 4.0 4.0  CL 102 105  CO2 25 25  GLUCOSE 113* 91  BUN 26* 27*  CREATININE 1.04 1.04    Recent Labs  12/16/13 2232 12/17/13 0610  TROPONINI <0.30 <0.30   Hepatic Function Panel  Recent Labs  12/16/13 2232  PROT 8.1  ALBUMIN 4.2  AST 19  ALT 29  ALKPHOS 62  BILITOT 0.4    Recent Labs  12/17/13 0610  CHOL 122   No results found for this basename: PROTIME,  in the last 72 hours  Imaging: Imaging results have been reviewed and Dg Chest Portable 1 View  12/16/2013   CLINICAL DATA:  Atrial fibrillation, history hypertension  EXAM: PORTABLE CHEST - 1 VIEW  COMPARISON:  Portable exam 1858 hr without priors for comparison  FINDINGS: Upper normal heart size.  Mildly tortuous aorta.  Mediastinal contours and pulmonary vascularity  normal.  Lungs clear.  No pleural effusion, pneumothorax or acute osseous findings.  IMPRESSION: No acute abnormalities.   Electronically Signed   By: Mark  Boles M.D.   On: 12/16/2013 19:32    Cardiac Studies:  Assessment/Plan:  A. fib with RVR  Exertional dyspnea/tired feeling in the neck rule out coronary insufficiency  Hypertension  History of alcohol abuse  History of tobacco abuse  Plan Schedule for nuclear stress test today Add digoxin As per orders Was scheduled for TEE assisted cardioversion tomorrow   LOS: 1 day    Lance Hubbard N 12/17/2013, 10:59 AM    

## 2013-12-18 NOTE — Anesthesia Preprocedure Evaluation (Addendum)
Anesthesia Evaluation  Patient identified by MRN, date of birth, ID band Patient awake    Reviewed: Allergy & Precautions, H&P , NPO status , Patient's Chart, lab work & pertinent test results  Airway Mallampati: III      Dental  (+) Partial Upper   Pulmonary former smoker,          Cardiovascular hypertension, Pt. on medications     Neuro/Psych    GI/Hepatic negative GI ROS, Neg liver ROS,   Endo/Other    Renal/GU Renal disease     Musculoskeletal   Abdominal   Peds  Hematology   Anesthesia Other Findings   Reproductive/Obstetrics                         Anesthesia Physical Anesthesia Plan  ASA: III  Anesthesia Plan: General   Post-op Pain Management:    Induction: Intravenous  Airway Management Planned: Mask  Additional Equipment:   Intra-op Plan:   Post-operative Plan:   Informed Consent: I have reviewed the patients History and Physical, chart, labs and discussed the procedure including the risks, benefits and alternatives for the proposed anesthesia with the patient or authorized representative who has indicated his/her understanding and acceptance.   Dental advisory given  Plan Discussed with: CRNA, Anesthesiologist and Surgeon  Anesthesia Plan Comments:        Anesthesia Quick Evaluation

## 2013-12-18 NOTE — Progress Notes (Signed)
  Echocardiogram Echocardiogram Transesophageal has been performed.  Philipp Deputy 12/18/2013, 9:59 AM

## 2013-12-18 NOTE — Transfer of Care (Signed)
Immediate Anesthesia Transfer of Care Note  Patient: Lance Hubbard  Procedure(s) Performed: Procedure(s) with comments: TRANSESOPHAGEAL ECHOCARDIOGRAM (TEE) (N/A) CARDIOVERSION (N/A) - 9:58 Lido 40 mg, IV Propofol 40 mg, IV for anesthesia,  synched cardioversio @ 120 joules with successful NSR   Patient Location: Endoscopy Unit  Anesthesia Type:General  Level of Consciousness: awake and sedated  Airway & Oxygen Therapy: Patient Spontanous Breathing and Patient connected to nasal cannula oxygen  Post-op Assessment: Report given to PACU RN, Post -op Vital signs reviewed and stable and Patient moving all extremities X 4  Post vital signs: Reviewed and stable  Complications: No apparent anesthesia complications

## 2013-12-18 NOTE — Progress Notes (Signed)
Subjective:  Patient denies any chest pain or shortness of breath states feels better after TEE cardioversion. Nuclear stress test suggested small area of ischemia in the septal wall with markedly depressed LV systolic function. Discussed with patient and family at length regarding  Cardiac cath possible PTCA stenting its risk and benefits and consents for PCI  Objective:  Vital Signs in the last 24 hours: Temp:  [97.4 F (36.3 C)-98.2 F (36.8 C)] 97.4 F (36.3 C) (02/23 1013) Pulse Rate:  [50-161] 100 (02/23 1025) Resp:  [13-22] 16 (02/23 1025) BP: (102-173)/(60-116) 173/84 mmHg (02/23 1025) SpO2:  [93 %-100 %] 100 % (02/23 1025)  Intake/Output from previous day: 02/22 0701 - 02/23 0700 In: 1265.8 [I.V.:1265.8] Out: -  Intake/Output from this shift: Total I/O In: 400 [I.V.:400] Out: -   Physical Exam: Neck: no adenopathy, no carotid bruit, no JVD and supple, symmetrical, trachea midline Lungs: clear to auscultation bilaterally Heart: regular rate and rhythm, S1, S2 normal and soft systolic murmur noted Abdomen: soft, non-tender; bowel sounds normal; no masses,  no organomegaly Extremities: extremities normal, atraumatic, no cyanosis or edema  Lab Results:  Recent Labs  12/17/13 0610 12/18/13 0410  WBC 9.7 8.4  HGB 13.3 12.7*  PLT 208 183    Recent Labs  12/16/13 2232 12/17/13 0610  NA 140 140  K 4.0 4.0  CL 102 105  CO2 25 25  GLUCOSE 113* 91  BUN 26* 27*  CREATININE 1.04 1.04    Recent Labs  12/17/13 0610 12/17/13 1420  TROPONINI <0.30 <0.30   Hepatic Function Panel  Recent Labs  12/16/13 2232  PROT 8.1  ALBUMIN 4.2  AST 19  ALT 29  ALKPHOS 62  BILITOT 0.4    Recent Labs  12/17/13 0610  CHOL 122   No results found for this basename: PROTIME,  in the last 72 hours  Imaging: Imaging results have been reviewed and Nm Myocar Multi W/spect W/wall Motion / Ef  12/17/2013   CLINICAL DATA:  Chest pain and atrial fibrillation  EXAM:  MYOCARDIAL IMAGING WITH SPECT (REST AND PHARMACOLOGIC-STRESS)  GATED LEFT VENTRICULAR WALL MOTION STUDY  LEFT VENTRICULAR EJECTION FRACTION  TECHNIQUE: Standard myocardial SPECT imaging was performed after resting intravenous injection of 10 mCi Tc-36m sestamibi. Subsequently, intravenous infusion of Lexiscan was performed under the supervision of the Cardiology staff. At peak effect of the drug, 30 mCi Tc-1m sestamibi was injected intravenously and standard myocardial SPECT imaging was performed. Quantitative gated imaging was also performed to evaluate left ventricular wall motion, and estimate left ventricular ejection fraction.  COMPARISON:  None.  FINDINGS: Left ventricular ejection fraction: 30%. The end-diastolic volume is equal to 83. The end systolic volume is equal to 58.  Gated SPECT: There is moderate diffuse global hypokinesis. More focal area of diminished motion is noted within the anterior septum.  Multi planer SPECT:There is a moderate size area of mild reversibility involving the apical, mid and basilar segments of the anterior septum.  IMPRESSION: 1. Mild reversibility involves the anterior septum. 2. Left ventricular ejection fraction equals 30%. 3. Significantly diminished left ventricular systolic function with more focal area of decreased motion involving the anterior septum.   Electronically Signed   By: Kerby Moors M.D.   On: 12/17/2013 14:53   Dg Chest Portable 1 View  12/16/2013   CLINICAL DATA:  Atrial fibrillation, history hypertension  EXAM: PORTABLE CHEST - 1 VIEW  COMPARISON:  Portable exam 1858 hr without priors for comparison  FINDINGS: Upper normal  heart size.  Mildly tortuous aorta.  Mediastinal contours and pulmonary vascularity normal.  Lungs clear.  No pleural effusion, pneumothorax or acute osseous findings.  IMPRESSION: No acute abnormalities.   Electronically Signed   By: Mark  Boles M.D.   On: 12/16/2013 19:32    Cardiac Studies:  Assessment/Plan:  Status  postA. fib with RVR  Exertional dyspnea/tired feeling in the neck rule out coronary insufficiency MI ruled out positive nuclear stress test Hypertension  History of alcohol abuse  History of tobacco abuse  Plan As per orders Discussed with patient and family regarding left eye possible PTCA stenting its risk and benefits i.e. Death MI stroke need for emergency CABG local vascular complications etc. And consents for PCI  LOS: 2 days    Lance Hubbard N 12/18/2013, 11:47 AM    

## 2013-12-18 NOTE — CV Procedure (Signed)
PRE-OP DIAGNOSIS:  Atrial fibrillation.  POST-OP DIAGNOSIS:  Atrial fibrillation with successful cardioversion to sinus rhythm.  OPERATOR:  Dixie Dials, MD.     ANESTHESIA:  Dr. Percell Miller.  COMPLICATIONS:  None.   OPERATIVE TERM:  DC cardioversion.  The nature of the procedure, risks and alternatives were discussed with the patient who gave informed consent.  OPERATIVE TECHNIQUE:  The patient was sedated with 40 mg. Of propafol post 40 mg. Of lidocaine IV.  When the patient was no longer responsive to quiet voice, DC cardioversion was performed with 120 J biphasically and synchronously.  Sinus rhythm was restored.  The patient was then monitored until fully alert and left the procedure area in stable condition.  IMPRESSION:  Successful DC cardioversion.

## 2013-12-18 NOTE — Interval H&P Note (Signed)
History and Physical Interval Note:  12/18/2013 8:02 AM  Lance Hubbard  has presented today for surgery, with the diagnosis of afib  The various methods of treatment have been discussed with the patient and family. After consideration of risks, benefits and other options for treatment, the patient has consented to  Procedure(s): TRANSESOPHAGEAL ECHOCARDIOGRAM (TEE) (N/A) CARDIOVERSION (N/A) as a surgical intervention .  The patient's history has been reviewed, patient examined, no change in status, stable for surgery.  I have reviewed the patient's chart and labs.  Questions were answered to the patient's satisfaction.     Grayton Lobo S

## 2013-12-18 NOTE — Preoperative (Signed)
Beta Blockers   Reason not to administer Beta Blockers:Not Applicable 

## 2013-12-18 NOTE — CV Procedure (Signed)
INDICATIONS:   The patient is 73 year old male with recent history of atrial fibrillation.  PROCEDURE:  Informed consent was discussed including risks, benefits and alternatives for the procedure.  Risks include, but are not limited to, cough, sore throat, vomiting, nausea, somnolence, esophageal and stomach trauma or perforation, bleeding, low blood pressure, aspiration, pneumonia, infection, trauma to the teeth and death.    Patient was given sedation.  The oropharynx was anesthetized with topical lidocaine.  The transesophageal probe was inserted in the esophagus and stomach and multiple views were obtained.  Agitated saline was used after the transesophageal probe was removed from the body.  The patient was kept under observation until the patient left the procedure room.  The patient left the procedure room in stable condition.   COMPLICATIONS:  There were no immediate complications.  FINDINGS:  1. LEFT VENTRICLE: The left ventricle is normal in structure and has mild LV dysfunction.  Wall motion showed mild generalized hypokinesia with 50 % EF.  No thrombus or masses seen in the left ventricle.  2. RIGHT VENTRICLE:  The right ventricle is normal in structure and function without any thrombus or masses.    3. LEFT ATRIUM:  The left atrium is normal without any thrombus or masses.  4. LEFT ATRIAL APPENDAGE:  The left atrial appendage is free of any thrombus or masses.  5. RIGHT ATRIUM:  The right atrium is free of any thrombus or masses.    6. ATRIAL SEPTUM:  The atrial septum is normal without any ASD or PFO.  7. MITRAL VALVE:  The mitral valve is normal in structure and function without masses, stenosis or vegetations. Mild to moderate multiple jets of regurgitation.  8. TRICUSPID VALVE:  The tricuspid valve is normal in structure and function without masses, stenosis or vegetations. Mild regurgitation.  9. AORTIC VALVE:  The aortic valve is normal in structure and function without  regurgitation, masses, stenosis or vegetations.   10. PULMONIC VALVE:  The pulmonic valve is normal in structure and function without regurgitation, masses, stenosis or vegetations.  11. AORTIC ARCH, ASCENDING AND DESCENDING AORTA:  The aorta had no atherosclerosis in the ascending or descending aorta.  The aortic arch was normal.  IMPRESSION:   Mild LV systolic dysfunction. Mild to moderate MR. Mild TR. No PFO or thrombus..  RECOMMENDATIONS:    External Cardioversion and medical treatment.

## 2013-12-18 NOTE — Anesthesia Postprocedure Evaluation (Signed)
  Anesthesia Post-op Note  Patient: Lance Hubbard  Procedure(s) Performed: Procedure(s) with comments: TRANSESOPHAGEAL ECHOCARDIOGRAM (TEE) (N/A) CARDIOVERSION (N/A) - 9:58 Lido 40 mg, IV Propofol 40 mg, IV for anesthesia,  synched cardioversio @ 120 joules with successful NSR   Patient Location: Endoscopy Unit  Anesthesia Type:General  Level of Consciousness: awake and oriented  Airway and Oxygen Therapy: Patient Spontanous Breathing and Patient connected to nasal cannula oxygen  Post-op Pain: none  Post-op Assessment: Post-op Vital signs reviewed, Patient's Cardiovascular Status Stable, Respiratory Function Stable, Patent Airway and No signs of Nausea or vomiting  Post-op Vital Signs: Reviewed and stable  Complications: No apparent anesthesia complications

## 2013-12-18 NOTE — Progress Notes (Signed)
[place] for heparin Indication: chest pain/ACS  Allergies  Allergen Reactions  . Amoxicillin     REACTION: whelps/pain in knees and ankles    Patient Measurements: Height: 6\' 2"  (188 cm) Weight: 245 lb 6 oz (111.3 kg) IBW/kg (Calculated) : 82.2 Heparin Dosing Weight: 105 kg  Vital Signs: Temp: 97.4 F (36.3 C) (02/23 1013) Temp src: Oral (02/23 1013) BP: 144/83 mmHg (02/23 1153) Pulse Rate: 56 (02/23 1153)  Labs:  Recent Labs  12/16/13 1916 12/16/13 1926 12/16/13 2232 12/17/13 0610 12/17/13 1420 12/18/13 0410  HGB 14.8 15.6 15.8 13.3  --  12.7*  HCT 42.2 46.0 45.0 39.1  --  37.9*  PLT 209  --  294 208  --  183  APTT  --   --  65*  --   --   --   LABPROT  --   --  13.5  --   --   --   INR  --   --  1.05  --   --   --   HEPARINUNFRC  --   --   --  0.11* 0.31 0.38  CREATININE  --  1.20 1.04 1.04  --   --   TROPONINI  --   --  <0.30 <0.30 <0.30  --     Estimated Creatinine Clearance: 86.4 ml/min (by C-G formula based on Cr of 1.04).  Assessment: 73 yo male with afib for heparin.  Heparin level therapeutic at 0.38.  No bleeding or complications noted per chart notes.  CBC stable.  Goal of Therapy:  Heparin level 0.3-0.7 units/ml Monitor platelets by anticoagulation protocol: Yes   Plan:  Continue heparin gtt to 1750 units/hr. Recheck heparin level with AM labs. Continue daily heparin level and CBC.   Hughes Better, PharmD, BCPS Clinical Pharmacist Pager: [phone] 12/18/2013 1:54 PM

## 2013-12-18 NOTE — Progress Notes (Signed)
Patient heart rate SB on monitor ranging from 49-58 on monitor highest being 63 while ambulating to rest room. Dr Terrence Dupont Made aware and orders were written. Will continue to monitor patient. Adyson Vanburen, Bettina Gavia RN

## 2013-12-19 ENCOUNTER — Encounter (HOSPITAL_COMMUNITY): Payer: Self-pay | Admitting: Cardiovascular Disease

## 2013-12-19 ENCOUNTER — Encounter (HOSPITAL_COMMUNITY)
Admission: EM | Disposition: A | Discharge status: Home / Self Care | Payer: Self-pay | Source: Home / Self Care | Attending: Cardiology

## 2013-12-19 HISTORY — PX: LEFT HEART CATHETERIZATION WITH CORONARY ANGIOGRAM: SHX5451

## 2013-12-19 LAB — CBC
HEMATOCRIT: 35.9 % — AB (ref 39.0–52.0)
HEMATOCRIT: 33.8 % — AB (ref 39.0–52.0)
HEMOGLOBIN: 12.3 g/dL — AB (ref 13.0–17.0)
HEMOGLOBIN: 11.6 g/dL — AB (ref 13.0–17.0)
MCH: 31.3 pg (ref 26.0–34.0)
MCH: 31.4 pg (ref 26.0–34.0)
MCHC: 34.3 g/dL (ref 30.0–36.0)
MCHC: 34.3 g/dL (ref 30.0–36.0)
MCV: 91.3 fL (ref 78.0–100.0)
MCV: 91.4 fL (ref 78.0–100.0)
Platelets: 95 10*3/uL — ABNORMAL LOW (ref 150–400)
Platelets: 159 10*3/uL (ref 150–400)
RBC: 3.93 MIL/uL — AB (ref 4.22–5.81)
RBC: 3.7 MIL/uL — AB (ref 4.22–5.81)
RDW: 13.2 % (ref 11.5–15.5)
RDW: 13.2 % (ref 11.5–15.5)
WBC: 7.2 10*3/uL (ref 4.0–10.5)
WBC: 7.1 10*3/uL (ref 4.0–10.5)

## 2013-12-19 LAB — POCT ACTIVATED CLOTTING TIME: Activated Clotting Time: 121 seconds

## 2013-12-19 LAB — HEPARIN LEVEL (UNFRACTIONATED): HEPARIN UNFRACTIONATED: 0.48 [IU]/mL (ref 0.30–0.70)

## 2013-12-19 SURGERY — LEFT HEART CATHETERIZATION WITH CORONARY ANGIOGRAM
Anesthesia: LOCAL

## 2013-12-19 MED ORDER — OXYCODONE-ACETAMINOPHEN 5-325 MG PO TABS
1.0000 | ORAL_TABLET | ORAL | Status: DC | PRN
  Administered 2013-12-20: 1 via ORAL
  Filled 2013-12-19: qty 1

## 2013-12-19 MED ORDER — HEPARIN (PORCINE) IN NACL 2-0.9 UNIT/ML-% IJ SOLN
INTRAMUSCULAR | Status: AC
  Filled 2013-12-19: qty 1000

## 2013-12-19 MED ORDER — LIDOCAINE HCL (PF) 1 % IJ SOLN
INTRAMUSCULAR | Status: AC
  Filled 2013-12-19: qty 30

## 2013-12-19 MED ORDER — APIXABAN 5 MG PO TABS
5.0000 mg | ORAL_TABLET | Freq: Two times a day (BID) | ORAL | Status: DC
  Administered 2013-12-19 – 2013-12-20 (×2): 5 mg via ORAL
  Filled 2013-12-19 (×5): qty 1

## 2013-12-19 MED ORDER — NITROGLYCERIN 0.2 MG/ML ON CALL CATH LAB
INTRAVENOUS | Status: AC
  Filled 2013-12-19: qty 1

## 2013-12-19 MED ORDER — FENTANYL CITRATE 0.05 MG/ML IJ SOLN
INTRAMUSCULAR | Status: AC
  Filled 2013-12-19: qty 2

## 2013-12-19 MED ORDER — SODIUM CHLORIDE 0.9 % IV SOLN: INTRAVENOUS | Status: AC

## 2013-12-19 MED ORDER — MIDAZOLAM HCL 2 MG/2ML IJ SOLN
INTRAMUSCULAR | Status: AC
  Filled 2013-12-19: qty 2

## 2013-12-19 NOTE — H&P (View-Only) (Signed)
Subjective:  Patient denies any chest pain or shortness of breath states feels better after TEE cardioversion. Nuclear stress test suggested small area of ischemia in the septal wall with markedly depressed LV systolic function. Discussed with patient and family at length regarding  Cardiac cath possible PTCA stenting its risk and benefits and consents for PCI  Objective:  Vital Signs in the last 24 hours: Temp:  [97.4 F (36.3 C)-98.2 F (36.8 C)] 97.4 F (36.3 C) (02/23 1013) Pulse Rate:  [50-161] 100 (02/23 1025) Resp:  [13-22] 16 (02/23 1025) BP: (102-173)/(60-116) 173/84 mmHg (02/23 1025) SpO2:  [93 %-100 %] 100 % (02/23 1025)  Intake/Output from previous day: 02/22 0701 - 02/23 0700 In: 1265.8 [I.V.:1265.8] Out: -  Intake/Output from this shift: Total I/O In: 400 [I.V.:400] Out: -   Physical Exam: Neck: no adenopathy, no carotid bruit, no JVD and supple, symmetrical, trachea midline Lungs: clear to auscultation bilaterally Heart: regular rate and rhythm, S1, S2 normal and soft systolic murmur noted Abdomen: soft, non-tender; bowel sounds normal; no masses,  no organomegaly Extremities: extremities normal, atraumatic, no cyanosis or edema  Lab Results:  Recent Labs  12/17/13 0610 12/18/13 0410  WBC 9.7 8.4  HGB 13.3 12.7*  PLT 208 183    Recent Labs  12/16/13 2232 12/17/13 0610  NA 140 140  K 4.0 4.0  CL 102 105  CO2 25 25  GLUCOSE 113* 91  BUN 26* 27*  CREATININE 1.04 1.04    Recent Labs  12/17/13 0610 12/17/13 1420  TROPONINI <0.30 <0.30   Hepatic Function Panel  Recent Labs  12/16/13 2232  PROT 8.1  ALBUMIN 4.2  AST 19  ALT 29  ALKPHOS 62  BILITOT 0.4    Recent Labs  12/17/13 0610  CHOL 122   No results found for this basename: PROTIME,  in the last 72 hours  Imaging: Imaging results have been reviewed and Nm Myocar Multi W/spect W/wall Motion / Ef  12/17/2013   CLINICAL DATA:  Chest pain and atrial fibrillation  EXAM:  MYOCARDIAL IMAGING WITH SPECT (REST AND PHARMACOLOGIC-STRESS)  GATED LEFT VENTRICULAR WALL MOTION STUDY  LEFT VENTRICULAR EJECTION FRACTION  TECHNIQUE: Standard myocardial SPECT imaging was performed after resting intravenous injection of 10 mCi Tc-36m sestamibi. Subsequently, intravenous infusion of Lexiscan was performed under the supervision of the Cardiology staff. At peak effect of the drug, 30 mCi Tc-1m sestamibi was injected intravenously and standard myocardial SPECT imaging was performed. Quantitative gated imaging was also performed to evaluate left ventricular wall motion, and estimate left ventricular ejection fraction.  COMPARISON:  None.  FINDINGS: Left ventricular ejection fraction: 30%. The end-diastolic volume is equal to 83. The end systolic volume is equal to 58.  Gated SPECT: There is moderate diffuse global hypokinesis. More focal area of diminished motion is noted within the anterior septum.  Multi planer SPECT:There is a moderate size area of mild reversibility involving the apical, mid and basilar segments of the anterior septum.  IMPRESSION: 1. Mild reversibility involves the anterior septum. 2. Left ventricular ejection fraction equals 30%. 3. Significantly diminished left ventricular systolic function with more focal area of decreased motion involving the anterior septum.   Electronically Signed   By: Kerby Moors M.D.   On: 12/17/2013 14:53   Dg Chest Portable 1 View  12/16/2013   CLINICAL DATA:  Atrial fibrillation, history hypertension  EXAM: PORTABLE CHEST - 1 VIEW  COMPARISON:  Portable exam 1858 hr without priors for comparison  FINDINGS: Upper normal  heart size.  Mildly tortuous aorta.  Mediastinal contours and pulmonary vascularity normal.  Lungs clear.  No pleural effusion, pneumothorax or acute osseous findings.  IMPRESSION: No acute abnormalities.   Electronically Signed   By: Lavonia Dana M.D.   On: 12/16/2013 19:32    Cardiac Studies:  Assessment/Plan:  Status  postA. fib with RVR  Exertional dyspnea/tired feeling in the neck rule out coronary insufficiency MI ruled out positive nuclear stress test Hypertension  History of alcohol abuse  History of tobacco abuse  Plan As per orders Discussed with patient and family regarding left eye possible PTCA stenting its risk and benefits i.e. Death MI stroke need for emergency CABG local vascular complications etc. And consents for PCI  LOS: 2 days    Lance Hubbard 12/18/2013, 11:47 AM

## 2013-12-19 NOTE — Cardiovascular Report (Signed)
NAMEMarland Kitchen  SHOJI, PERTUIT NO.:  [id number]  MEDICAL RECORD NO.:  [id number]  LOCATION:  [facility]                         FACILITY:  [place]  PHYSICIAN:  Allegra Lai. Terrence Dupont, M.D. DATE OF BIRTH:  1941/10/26  DATE OF PROCEDURE:  12/19/2013 DATE OF DISCHARGE:                           CARDIAC CATHETERIZATION   PROCEDURE:  Left cardiac cath with selective left and right coronary angiography, LV graphy via right groin using Judkins technique.  INDICATION FOR THE PROCEDURE:  Mr. Fritsch is a 73 year old man with past medical history significant for hypertension, history of tobacco abuse, history of alcohol abuse, he was transferred from Urgent Care as the patient was noted to be in AFib with RVR.  The patient states since yesterday afternoon, he is having lightheadedness associated with generalized tired feeling and tired feeling in the neck and was noted to have irregular heartbeat and low blood pressure.  The patient denies any syncopal episode.  Denies any chest pain, nausea, vomiting, diaphoresis. Denies history of exertional chest pain, although gives history of exertional dyspnea.  The patient went to the urgent care and was noted to have AFib with RVR, heart rate in 170s to 180s with diffuse ST-T wave changes in anterolateral and inferior leads.  The patient received 20 mg bolus of Cardizem with control of heart rate in one-teens.  The patient denies any cardiac workup in the past.  Denies any thyroid problems. Denies history of rheumatic fever.  The patient states he drinks hard liquor 3-4 drinks few days a week.  The patient was admitted to telemetry unit and was started on IV Cardizem and p.o. Lopressor with good rate control.  Due to exertional dyspnea, neck pain and multiple risk factors, the patient subsequently underwent Lexiscan Myoview which showed mild reversible ischemia involving the anterior septum with markedly depressed LV systolic function of 58%.  The  patient subsequently underwent TEE assisted cardioversion without any problems. Due to multiple risk factors, EKG changes, mildly abnormal nuclear stress test, and significantly depressed LV systolic function discussed with the patient and his wife at length regarding cardiac cath, possible PTCA stenting, its risks and benefits, i.e., death, MI, stroke, need for emergency CABG, local vascular complications, etc., and consented for PCI.  PROCEDURE:  After obtaining the informed consent, patient was brought to the cath lab and was placed on fluoroscopy table.  Right groin was prepped and draped in usual fashion.  A 1% Xylocaine was used for local anesthesia in the right groin.  With the help of thin wall needle, 5- French arterial sheath was placed.  The sheath was aspirated and flushed.  A 5-French left Judkins catheter was advanced over the wire under fluoroscopic guidance up to the ascending aorta.  Wire was pulled out. The catheter was aspirated and connected to the Manifold.  Catheter was further advanced and engaged into left coronary ostium.  Multiple views of the left system were taken.  Next, the catheter was disengaged and was pulled out over the wire and was replaced with 5-French right Judkins catheter, which was advanced over the wire under fluoroscopic guidance up to the ascending aorta.  Wire was pulled out.  The catheter was  aspirated and connected to the Manifold.  Catheter was further advanced and engaged into right coronary ostium.  Multiple views of the right system were taken.  Next, the catheter was disengaged and was pulled out over the wire and was replaced with 5-French pigtail catheter which was advanced over the wire under fluoroscopic guidance up to the ascending aorta.  Wire was pulled out.  The catheter was aspirated and connected to the Manifold.  Catheter was further advanced across the aortic valve into the LV.  LV pressures were recorded.  LV graph was  done in 30-degree RAO position.  Post-angiographic pressures were recorded from LV and then pullback pressures were recorded from aorta.  There was no gradient across the aortic valve. Next, pigtail catheter was pulled out over the wire.  Sheaths were aspirated and flushed.  FINDINGS:  LV was mildly enlarged.  There was mild global hypokinesia. EF of 40-45%.  Left main was patent.  LAD was patent.  Diagonal 1 and 2 were small which were patent.  Left circumflex was patent.  OM1 was very small which was patent.  OM2 and 3 were moderate-sized, which were patent.  RCA was patent.  PDA and PLV branches were patent.  The patient tolerated the procedure well.  There were no complications.  The patient was transferred to recovery room in stable condition.     Allegra Lai. Terrence Dupont, M.D.     MNH/MEDQ  D:  12/19/2013  T:  12/19/2013  Job:  [id number]

## 2013-12-19 NOTE — Interval H&P Note (Signed)
  Cath Lab Visit (complete for each Cath Lab visit)  Clinical Evaluation Leading to the Procedure:   ACS: no  Non-ACS:    Anginal Classification: CCS III  Anti-ischemic medical therapy: Minimal Therapy (1 class of medications)  Non-Invasive Test Results: Intermediate-risk stress test findings: cardiac mortality 1-3%/year  Prior CABG: No previous CABG      History and Physical Interval Note:  12/19/2013 9:01 AM  Lance Hubbard  has presented today for surgery, with the diagnosis of chect pain, abnormal stress test  The various methods of treatment have been discussed with the patient and family. After consideration of risks, benefits and other options for treatment, the patient has consented to  Procedure(s): LEFT HEART CATHETERIZATION WITH CORONARY ANGIOGRAM (N/A) as a surgical intervention .  The patient's history has been reviewed, patient examined, no change in status, stable for surgery.  I have reviewed the patient's chart and labs.  Questions were answered to the patient's satisfaction.     Clent Demark

## 2013-12-19 NOTE — Progress Notes (Signed)
Nutrition Brief Note  Patient identified on the Malnutrition Screening Tool (MST) Report  Wt Readings from Last 15 Encounters:  12/16/13 245 lb 6 oz (111.3 kg)  12/16/13 245 lb 6 oz (111.3 kg)  12/16/13 245 lb 6 oz (111.3 kg)  12/16/13 249 lb (112.946 kg)    Body mass index is 31.49 kg/(m^2). Patient meets criteria for overweight based on current BMI.   Pt reports that he has lost 15 lbs recently from being on [employer].  Current diet order is regular, patient is consuming approximately 100% of meals at this time. Labs and medications reviewed.   No nutrition interventions warranted at this time. If nutrition issues arise, please consult RD.   Terrace Arabia RD, LDN

## 2013-12-19 NOTE — CV Procedure (Signed)
Left cardiac cath report dictated on 12/19/2013 dictation number is [id number]

## 2013-12-19 NOTE — Progress Notes (Signed)
Right groin dry/intact with no bleeding or hematoma. Ambulated 200 ft independently in hallway with RN. Returned to bedside chair in room. Call bell and family near. Will continue to monitor.Cindee Salt

## 2013-12-19 NOTE — Progress Notes (Signed)
Returned from cath lab. Assessed per flow sheet. Denies chest pain or shortness of breath. Right groin dressing dry/intact with no bleeding or hematoma palpable. Post activity and precautions explained. Verbalized understanding. Call bell near.Cindee Salt

## 2013-12-19 NOTE — Progress Notes (Signed)
Bedrest complete. Right groin dressing remains dry/intact. No bleeding or hematoma palpable. Patient request to get out of bed when wife visit. Instructed to notify RN when wife arrive to re-evaluate groin prior to walking; verbalized understanding. Call bell near.Cindee Salt

## 2013-12-19 NOTE — Progress Notes (Signed)
ANTICOAGULATION CONSULT NOTE - Initial Consult  Pharmacy Consult for Apixaban Indication: atrial fibrillation  Allergies  Allergen Reactions  . Amoxicillin     REACTION: whelps/pain in knees and ankles    Patient Measurements: Height: 6\' 2"  (188 cm) Weight: 245 lb 6 oz (111.3 kg) IBW/kg (Calculated) : 82.2 Heparin Dosing Weight:   Vital Signs: Temp: 97.8 F (36.6 C) (02/24 1100) Temp src: Oral (02/24 1100) BP: 123/68 mmHg (02/24 1428) Pulse Rate: 56 (02/24 1428)  Labs:  Recent Labs  12/16/13 1916 12/16/13 1926 12/16/13 2232  12/17/13 0610 12/17/13 1420 12/18/13 0410 12/19/13 0314 12/19/13 0755  HGB 14.8 15.6 15.8  --  13.3  --  12.7* 12.3* 11.6*  HCT 42.2 46.0 45.0  --  39.1  --  37.9* 35.9* 33.8*  PLT 209  --  294  --  208  --  183 95* 159  APTT  --   --  65*  --   --   --   --   --   --   LABPROT  --   --  13.5  --   --   --   --   --   --   INR  --   --  1.05  --   --   --   --   --   --   HEPARINUNFRC  --   --   --   < > 0.11* 0.31 0.38 0.48  --   CREATININE  --  1.20 1.04  --  1.04  --   --   --   --   TROPONINI  --   --  <0.30  --  <0.30 <0.30  --   --   --   < > = values in this interval not displayed.  Estimated Creatinine Clearance: 86.4 ml/min (by C-G formula based on Cr of 1.04).   Medical History: Past Medical History  Diagnosis Date  . Hypertension   . Kidney stone     Assessment: 72yom s/p cath to start apixaban for new onset Afib. Heparin drip was discontinued this AM prior to cath.  - H/H and Plts low normal - No significant bleeding reported, cath site stable per documentation - Wt: 111kg, SCr 1.04 - LFTs wnl  Plan:  1. Apixaban 5mg  PO BID  2. Monitor for s/sx of bleeding  Earleen Newport [phone] 12/19/2013,4:59 PM

## 2013-12-20 LAB — CBC
HEMATOCRIT: 33.8 % — AB (ref 39.0–52.0)
HEMOGLOBIN: 11.4 g/dL — AB (ref 13.0–17.0)
MCH: 30.7 pg (ref 26.0–34.0)
MCHC: 33.7 g/dL (ref 30.0–36.0)
MCV: 91.1 fL (ref 78.0–100.0)
Platelets: 137 10*3/uL — ABNORMAL LOW (ref 150–400)
RBC: 3.71 MIL/uL — ABNORMAL LOW (ref 4.22–5.81)
RDW: 13.3 % (ref 11.5–15.5)
WBC: 7.7 10*3/uL (ref 4.0–10.5)

## 2013-12-20 MED ORDER — APIXABAN 5 MG PO TABS: 5.0000 mg | ORAL_TABLET | Freq: Two times a day (BID) | ORAL | Status: AC

## 2013-12-20 MED ORDER — CARVEDILOL 3.125 MG PO TABS: 3.1250 mg | ORAL_TABLET | Freq: Two times a day (BID) | ORAL | Status: DC

## 2013-12-20 MED ORDER — RAMIPRIL 5 MG PO CAPS: 5.0000 mg | ORAL_CAPSULE | Freq: Every day | ORAL | Status: DC

## 2013-12-20 MED ORDER — AMIODARONE HCL 200 MG PO TABS: 200.0000 mg | ORAL_TABLET | Freq: Two times a day (BID) | ORAL | Status: DC

## 2013-12-20 NOTE — Discharge Instructions (Signed)
Atrial Fibrillation Atrial fibrillation is a condition that causes your heart to beat irregularly. It may also cause your heart to beat faster than normal. Atrial fibrillation can prevent your heart from pumping blood normally. It increases your risk of stroke and heart problems. HOME CARE  Take medications as told by your doctor.  Only take medications that your doctor says are safe. Some medications can make the condition worse or happen again.  If blood thinners were prescribed by your doctor, take them exactly as told. Too much can cause bleeding. Too little and you will not have the needed protection against stroke and other problems.  Perform blood tests at home if told by your doctor.  Perform blood tests exactly as told by your doctor.  Do not drink alcohol.  Do not drink beverages with caffeine such as coffee, soda, and some teas.  Maintain a healthy weight.  Do not use diet pills unless your doctor says they are safe. They may make heart problems worse.  Follow diet instructions as told by your doctor.  Exercise regularly as told by your doctor.  Keep all follow-up appointments. GET HELP RIGHT AWAY IF:   You have chest or belly (abdominal) pain.  You feel sick to your stomach (nauseous)  You suddenly have swollen feet and ankles.  You feel dizzy.  You face, arms, or legs feel numb or weak.  There is a change in your vision or speech.  You notice a change in the speed, rhythm, or strength of your heartbeat.  You suddenly begin peeing (urinating) more often.  You get tired more easily when moving or exercising. MAKE SURE YOU:   Understand these instructions.  Will watch your condition.  Will get help right away if you are not doing well or get worse. Document Released: 07/21/2008 Document Revised: 02/06/2013 Document Reviewed: 11/22/2012 [facility] Patient Information 2014 [place].  Information on my medicine - ELIQUIS (apixaban)  Why was  Eliquis prescribed for you? Eliquis was prescribed for you to reduce the risk of forming blood clots that can cause a stroke if you have a medical condition called atrial fibrillation (a type of irregular heartbeat) OR to reduce the risk of a blood clots forming after orthopedic surgery.  What do You need to know about Eliquis ? Take your Eliquis TWICE DAILY - one tablet in the morning and one tablet in the evening with or without food.  It would be best to take the doses about the same time each day.  If you have difficulty swallowing the tablet whole please discuss with your pharmacist how to take the medication safely.  Take Eliquis exactly as prescribed by your doctor and DO NOT stop taking Eliquis without talking to the doctor who prescribed the medication.  Stopping may increase your risk of developing a new clot or stroke.  Refill your prescription before you run out.  After discharge, you should have regular check-up appointments with your healthcare provider that is prescribing your Eliquis.  In the future your dose may need to be changed if your kidney function or weight changes by a significant amount or as you get older.  What do you do if you miss a dose? If you miss a dose, take it as soon as you remember on the same day and resume taking twice daily.  Do not take more than one dose of ELIQUIS at the same time.  Important Safety Information A possible side effect of Eliquis is bleeding. You should call  your healthcare provider right away if you experience any of the following:   Bleeding from an injury or your nose that does not stop.   Unusual colored urine (red or dark brown) or unusual colored stools (red or black).   Unusual bruising for unknown reasons.   A serious fall or if you hit your head (even if there is no bleeding).  Some medicines may interact with Eliquis and might increase your risk of bleeding or clotting while on Eliquis. To help avoid this, consult  your healthcare provider or pharmacist prior to using any new prescription or non-prescription medications, including herbals, vitamins, non-steroidal anti-inflammatory drugs (NSAIDs) and supplements.  This website has more information on Eliquis (apixaban): [email or web address].

## 2013-12-20 NOTE — Discharge Summary (Signed)
NAMEMarland Hubbard  ERSKINE, STEINFELDT NO.:  [id number]  MEDICAL RECORD NO.:  [id number]  LOCATION:  2W11C                        FACILITY:  [facility]  PHYSICIAN:  Allegra Lai. Terrence Dupont, M.D. DATE OF BIRTH:  Sep 24, 1941  DATE OF ADMISSION:  12/16/2013 DATE OF DISCHARGE:  12/20/2013                              DISCHARGE SUMMARY   ADMITTING DIAGNOSES: 1. Atrial fibrillation with rapid ventricular response. 2. Exertional dyspnea/feeling tired, feeling tired in the neck, rule     out coronary insufficiency. 3. Hypertension. 4. History of alcohol abuse. 5. History of tobacco abuse.  FINAL DIAGNOSES: 1. Status post atrial fibrillation with rapid ventricular response,     converted to sinus rhythm post transesophageal echocardiographic     cardioversion. 2. Mild coronary artery disease.  Positive nuclear stress test, status     post left catheterization. 3. 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 4. Hypertension. 5. History of alcohol abuse. 6. History of tobacco abuse. 7. Thrombocytopenia, improved.  MEDICATIONS:  Discharge home medications are: 1. Amiodarone 200 mg 1 tablet twice daily for 1 week and then will cut     down the dose to 1 tablet daily. 2. Eliquis 5 mg twice daily. 3. Carvedilol 3.125 mg 1 tablet twice daily. 4. Ramipril 5 mg 1 capsule daily. 5. Temovate 0.5% external solution, apply locally as before.  The patient has been advised to stop amlodipine, aspirin, losartan HCT, and Naprosyn.  DIET:  Low salt, low cholesterol.  ACTIVITY:  Increase activity slowly as tolerated.  DISCHARGE INSTRUCTIONS:  Post cardiac cath instructions have been given. Follow up with me in 1 week.  CONDITION AT DISCHARGE:  Stable.  BRIEF HISTORY AND HOSPITAL COURSE:  Lance Hubbard is a 73 year old male with past medical history significant for hypertension, history of tobacco abuse, history of alcohol abuse, was transferred from urgent care as the patient was  noted to be in AFib with RVR.  The patient states since yesterday afternoon, he is having lightheadedness associated with generalized tired feeling and tired feeling in the neck, and was noted to have irregular heartbeat and low blood pressure.  The patient denies any syncopal episode.  Denies any chest pain, nausea, vomiting, diaphoresis.  Denies history of exertional chest pain, although gives history of exertional dyspnea.  The patient went to urgent care today and was noted to have AFib with RVR.  Heart rate in 170s to 180s with diffuse ST-T wave changes in anterolateral and inferior leads.  The patient received 20 mg of bolus of Cardizem with control of heart rate in the 110s to 120s.  The patient denies any cardiac workup, denies any thyroid problems, denies any history of rheumatic fever.  States he drinks hard liquor 3 to 5 drinks every few days.  The patient denies any feeling of palpitations.  PAST SURGICAL HISTORY:  Knee surgery and knee arthroscopic surgery in the past.  PHYSICAL EXAMINATION:  GENERAL:  He was alert, awake, oriented x3. VITAL SIGNS:  Blood pressure was 119/71, pulse was 102, irregularly irregular.  He was afebrile. HEENT:  Conjunctivae pink. NECK:  Supple.  No JVD.  No bruit. LUNGS:  Clear to auscultation without rhonchi or rales. CARDIOVASCULAR:  S1, S2 was soft.  He was tachycardic, irregularly irregular. ABDOMEN:  Soft.  Bowel sounds were present.  Nontender. EXTREMITIES:  There is no clubbing, cyanosis, or edema. NEUROLOGIC:  Grossly intact.  LABORATORY DATA:  Sodium was 143, potassium 4.0, BUN 28, creatinine 1.20.  His blood sugar was 93.  Hemoglobin was 14.8, hematocrit 42.2, white count of 11.9, platelet count 209,000.  Two sets of cardiac enzymes were negative.  Cholesterol was 122, triglycerides 94, HDL 33, LDL 70.  Repeat hemoglobin was 11.6, hematocrit 33.8, white count of 7.1, platelet count 159,000.  His TSH was normal at 2.47.  IMAGING  STUDIES:  The patient had nuclear stress test which showed mild reversible ischemia involving the anterior septum, ejection fraction of 30%.  The patient subsequently underwent TEE cardioversion on December 18, 2013, and tolerated the procedure well and remained in sinus rhythm.  Chest x-ray showed upper normal size of the heart, mildly tortuous aorta.  Lungs are clear.  There was no evidence of pleural effusion, pneumothorax, or any acute osseous findings.  BRIEF HOSPITAL COURSE:  The patient was admitted to telemetry unit, was started on IV Cardizem and p.o. Lopressor with control of his heart rate.  The patient was also started on IV heparin.  The patient subsequently underwent nuclear stress test as above which showed small area of ischemia with markedly depressed LV systolic function.  The patient subsequently underwent TEE-assisted cardioversion by Dr. Doylene Canard.  The patient tolerated the procedure well and remained in sinus rhythm.  Subsequently, the patient underwent left cardiac cath with selective left and right coronary angiography.  Yesterday, the patient was noted to have mild coronary artery disease with no significant critical stenosis.  The patient tolerated the procedure well.  Post procedure, the patient did not have any episodes of neck pain or jaw pain.  His groin is stable with no evidence of hematoma or bruit.  The patient is ambulating in the room without any problems and has remained in sinus rhythm.  The patient will be discharged home on above medications and will be followed up in my office in 1 week, will increase his beta-blockers and ACE inhibitors as an outpatient as blood pressure tolerates.  Will cut down the dose of amiodarone to 200 mg once a day. The patient has been discussed extensively regarding lifestyle modification and refraining from alcohol and smoking, to which he agrees.  The patient will be discharged home today and will be followed up in  my office in 1 week and his primary medical doctor as scheduled.     Allegra Lai. Terrence Dupont, M.D.     MNH/MEDQ  D:  12/20/2013  T:  12/20/2013  Job:  [id number]

## 2013-12-20 NOTE — Progress Notes (Signed)
Discharge instruction along with med list and scripts provided. IV d/c'd with catheter intact. Medication education was provided to patient per Unit pharmacist. Patient transported via wheelchair to main lobby for d/c home. Belongings with patient and family.Cindee Salt

## 2013-12-20 NOTE — Discharge Summary (Signed)
  Discharge summary dictated on 12/20/2013 dictation number is [phone]

## 2013-12-21 LAB — HEPARIN INDUCED THROMBOCYTOPENIA PNL
Heparin Induced Plt Ab: NEGATIVE
Patient O.D.: 0.245
UFH High Dose UFH H: 0 % Release
UFH LOW DOSE 0.1 IU/ML: 0 %
UFH LOW DOSE 0.5 IU/ML: 0 %
UFH SRA RESULT: NEGATIVE

## 2014-01-04 ENCOUNTER — Encounter: Payer: Self-pay | Admitting: Internal Medicine

## 2014-05-16 ENCOUNTER — Encounter: Payer: Self-pay | Admitting: Internal Medicine

## 2014-10-04 ENCOUNTER — Encounter (HOSPITAL_COMMUNITY): Payer: Self-pay | Admitting: Cardiology

## 2015-05-20 ENCOUNTER — Encounter: Payer: Self-pay | Admitting: Internal Medicine

## 2015-11-29 DIAGNOSIS — F17290 Nicotine dependence, other tobacco product, uncomplicated: Secondary | ICD-10-CM | POA: Diagnosis not present

## 2015-11-29 DIAGNOSIS — E785 Hyperlipidemia, unspecified: Secondary | ICD-10-CM | POA: Diagnosis not present

## 2015-11-29 DIAGNOSIS — I10 Essential (primary) hypertension: Secondary | ICD-10-CM | POA: Diagnosis not present

## 2015-11-29 DIAGNOSIS — I480 Paroxysmal atrial fibrillation: Secondary | ICD-10-CM | POA: Diagnosis not present

## 2016-01-20 DIAGNOSIS — I480 Paroxysmal atrial fibrillation: Secondary | ICD-10-CM | POA: Diagnosis not present

## 2016-01-20 DIAGNOSIS — I10 Essential (primary) hypertension: Secondary | ICD-10-CM | POA: Diagnosis not present

## 2016-01-20 DIAGNOSIS — E785 Hyperlipidemia, unspecified: Secondary | ICD-10-CM | POA: Diagnosis not present

## 2016-03-06 DIAGNOSIS — I480 Paroxysmal atrial fibrillation: Secondary | ICD-10-CM | POA: Diagnosis not present

## 2016-03-06 DIAGNOSIS — E785 Hyperlipidemia, unspecified: Secondary | ICD-10-CM | POA: Diagnosis not present

## 2016-03-06 DIAGNOSIS — I10 Essential (primary) hypertension: Secondary | ICD-10-CM | POA: Diagnosis not present

## 2016-03-06 DIAGNOSIS — F17290 Nicotine dependence, other tobacco product, uncomplicated: Secondary | ICD-10-CM | POA: Diagnosis not present

## 2016-04-30 DIAGNOSIS — H2511 Age-related nuclear cataract, right eye: Secondary | ICD-10-CM | POA: Diagnosis not present

## 2016-04-30 DIAGNOSIS — H2512 Age-related nuclear cataract, left eye: Secondary | ICD-10-CM | POA: Diagnosis not present

## 2016-07-06 DIAGNOSIS — E785 Hyperlipidemia, unspecified: Secondary | ICD-10-CM | POA: Diagnosis not present

## 2016-07-06 DIAGNOSIS — I482 Chronic atrial fibrillation: Secondary | ICD-10-CM | POA: Diagnosis not present

## 2016-07-06 DIAGNOSIS — I10 Essential (primary) hypertension: Secondary | ICD-10-CM | POA: Diagnosis not present

## 2016-07-06 DIAGNOSIS — F17290 Nicotine dependence, other tobacco product, uncomplicated: Secondary | ICD-10-CM | POA: Diagnosis not present

## 2016-09-11 DIAGNOSIS — H25813 Combined forms of age-related cataract, bilateral: Secondary | ICD-10-CM | POA: Diagnosis not present

## 2016-10-28 DIAGNOSIS — H04123 Dry eye syndrome of bilateral lacrimal glands: Secondary | ICD-10-CM | POA: Diagnosis not present

## 2016-10-28 DIAGNOSIS — H2513 Age-related nuclear cataract, bilateral: Secondary | ICD-10-CM | POA: Diagnosis not present

## 2016-10-30 DIAGNOSIS — H04123 Dry eye syndrome of bilateral lacrimal glands: Secondary | ICD-10-CM | POA: Diagnosis not present

## 2016-10-30 DIAGNOSIS — H2513 Age-related nuclear cataract, bilateral: Secondary | ICD-10-CM | POA: Diagnosis not present

## 2016-10-30 DIAGNOSIS — I480 Paroxysmal atrial fibrillation: Secondary | ICD-10-CM | POA: Diagnosis not present

## 2016-10-30 DIAGNOSIS — F17290 Nicotine dependence, other tobacco product, uncomplicated: Secondary | ICD-10-CM | POA: Diagnosis not present

## 2016-10-30 DIAGNOSIS — E785 Hyperlipidemia, unspecified: Secondary | ICD-10-CM | POA: Diagnosis not present

## 2016-10-30 DIAGNOSIS — I10 Essential (primary) hypertension: Secondary | ICD-10-CM | POA: Diagnosis not present

## 2016-10-30 DIAGNOSIS — H16143 Punctate keratitis, bilateral: Secondary | ICD-10-CM | POA: Diagnosis not present

## 2016-11-16 DIAGNOSIS — H2512 Age-related nuclear cataract, left eye: Secondary | ICD-10-CM | POA: Diagnosis not present

## 2016-11-25 DIAGNOSIS — Z0000 Encounter for general adult medical examination without abnormal findings: Secondary | ICD-10-CM | POA: Diagnosis not present

## 2016-11-25 DIAGNOSIS — E7800 Pure hypercholesterolemia, unspecified: Secondary | ICD-10-CM | POA: Diagnosis not present

## 2016-11-25 DIAGNOSIS — I480 Paroxysmal atrial fibrillation: Secondary | ICD-10-CM | POA: Diagnosis not present

## 2016-11-25 DIAGNOSIS — I10 Essential (primary) hypertension: Secondary | ICD-10-CM | POA: Diagnosis not present

## 2016-12-08 DIAGNOSIS — H2511 Age-related nuclear cataract, right eye: Secondary | ICD-10-CM | POA: Diagnosis not present

## 2016-12-14 DIAGNOSIS — H2511 Age-related nuclear cataract, right eye: Secondary | ICD-10-CM | POA: Diagnosis not present

## 2017-01-04 DIAGNOSIS — E7800 Pure hypercholesterolemia, unspecified: Secondary | ICD-10-CM | POA: Diagnosis not present

## 2017-01-04 DIAGNOSIS — I10 Essential (primary) hypertension: Secondary | ICD-10-CM | POA: Diagnosis not present

## 2017-01-04 DIAGNOSIS — I480 Paroxysmal atrial fibrillation: Secondary | ICD-10-CM | POA: Diagnosis not present

## 2017-02-24 DIAGNOSIS — F17290 Nicotine dependence, other tobacco product, uncomplicated: Secondary | ICD-10-CM | POA: Diagnosis not present

## 2017-02-24 DIAGNOSIS — I480 Paroxysmal atrial fibrillation: Secondary | ICD-10-CM | POA: Diagnosis not present

## 2017-02-24 DIAGNOSIS — I10 Essential (primary) hypertension: Secondary | ICD-10-CM | POA: Diagnosis not present

## 2017-02-24 DIAGNOSIS — E785 Hyperlipidemia, unspecified: Secondary | ICD-10-CM | POA: Diagnosis not present

## 2017-06-30 DIAGNOSIS — F17290 Nicotine dependence, other tobacco product, uncomplicated: Secondary | ICD-10-CM | POA: Diagnosis not present

## 2017-06-30 DIAGNOSIS — I10 Essential (primary) hypertension: Secondary | ICD-10-CM | POA: Diagnosis not present

## 2017-06-30 DIAGNOSIS — E785 Hyperlipidemia, unspecified: Secondary | ICD-10-CM | POA: Diagnosis not present

## 2017-06-30 DIAGNOSIS — I480 Paroxysmal atrial fibrillation: Secondary | ICD-10-CM | POA: Diagnosis not present

## 2017-09-07 DIAGNOSIS — L821 Other seborrheic keratosis: Secondary | ICD-10-CM | POA: Diagnosis not present

## 2017-09-07 DIAGNOSIS — L718 Other rosacea: Secondary | ICD-10-CM | POA: Diagnosis not present

## 2017-09-07 DIAGNOSIS — D225 Melanocytic nevi of trunk: Secondary | ICD-10-CM | POA: Diagnosis not present

## 2019-04-17 ENCOUNTER — Encounter: Payer: Self-pay | Admitting: Family Medicine

## 2019-04-17 ENCOUNTER — Ambulatory Visit (INDEPENDENT_AMBULATORY_CARE_PROVIDER_SITE_OTHER): Payer: Medicare HMO | Admitting: Family Medicine

## 2019-04-17 ENCOUNTER — Other Ambulatory Visit: Payer: Self-pay

## 2019-04-17 ENCOUNTER — Ambulatory Visit (HOSPITAL_BASED_OUTPATIENT_CLINIC_OR_DEPARTMENT_OTHER)
Admission: RE | Admit: 2019-04-17 | Discharge: 2019-04-17 | Disposition: A | Discharge status: Home / Self Care | Payer: Medicare HMO | Source: Ambulatory Visit | Attending: Family Medicine | Admitting: Family Medicine

## 2019-04-17 VITALS — Ht 74.0 in | Wt 215.0 lb

## 2019-04-17 DIAGNOSIS — M79604 Pain in right leg: Secondary | ICD-10-CM

## 2019-04-17 DIAGNOSIS — M545 Low back pain, unspecified: Secondary | ICD-10-CM

## 2019-04-17 NOTE — Patient Instructions (Signed)
We will go ahead with an MRI of your lumbar spine to assess the degree of spinal stenosis you have. Follow up with me after this to go over results and next steps.

## 2019-04-18 ENCOUNTER — Encounter: Payer: Self-pay | Admitting: Family Medicine

## 2019-04-18 NOTE — Progress Notes (Signed)
PCP: Christain Sacramento, MD  Subjective:   HPI: Patient is a 78 y.o. male here for bilateral leg pain.  Patient here with his wife. They report since about 6/1 he's had worsening pain in both legs. Pain from low back into posterior thighs. Legs have become very weak to the point where he walks a little bit and 'gets stuck' due to pain in his legs and has a hard time getting back into the house. Pain is burning. No bowel/bladder dysfunction, saddle anesthesia. Pain level is 7/10 and sharp. 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  Past Medical History:  Diagnosis Date  . Hypertension   . Kidney stone     Current Outpatient Medications on File Prior to Visit  Medication Sig Dispense Refill  . losartan-hydrochlorothiazide (HYZAAR) 100-12.5 MG tablet TAKE 1 TABLET BY MOUTH DAILY FOR HIGH BLOOD PRESSURE.    [name] amiodarone (PACERONE) 200 MG tablet Take 1 tablet (200 mg total) by mouth 2 (two) times daily. 30 tablet 3  . apixaban (ELIQUIS) 5 MG TABS tablet Take 1 tablet (5 mg total) by mouth every 12 (twelve) hours. 60 tablet 3  . carvedilol (COREG) 3.125 MG tablet Take 1 tablet (3.125 mg total) by mouth 2 (two) times daily with a meal. 60 tablet 3  . clobetasol (TEMOVATE) 0.05 % external solution Apply 1 application topically 2 (two) times daily.     No current facility-administered medications on file prior to visit.     Past Surgical History:  Procedure Laterality Date  . CARDIOVERSION N/A 12/18/2013   Procedure: CARDIOVERSION;  Surgeon: Birdie Riddle, MD;  Location: [facility] ENDOSCOPY;  Service: Cardiovascular;  Laterality: N/A;  9:58 Lido 40 mg, IV Propofol 40 mg, IV for anesthesia,  synched cardioversio @ 120 joules with successful NSR   . KNEE ARTHROSCOPY    . knee dsurgery    . LEFT HEART CATHETERIZATION WITH CORONARY ANGIOGRAM N/A 12/19/2013   Procedure: LEFT HEART CATHETERIZATION WITH CORONARY ANGIOGRAM;  Surgeon: Clent Demark, MD;   Location: [facility] CATH LAB;  Service: Cardiovascular;  Laterality: N/A;  . TEE WITHOUT CARDIOVERSION N/A 12/18/2013   Procedure: TRANSESOPHAGEAL ECHOCARDIOGRAM (TEE);  Surgeon: Birdie Riddle, MD;  Location: [facility] ENDOSCOPY;  Service: Cardiovascular;  Laterality: N/A;    Allergies  Allergen Reactions  . Amoxicillin     REACTION: whelps/pain in knees and ankles    Social History   Socioeconomic History  . Marital status: Married    Spouse name: Not on file  . Number of children: Not on file  . Years of education: Not on file  . Highest education level: Not on file  Occupational History  . Not on file  Social Needs  . Financial resource strain: Not on file  . Food insecurity    Worry: Not on file    Inability: Not on file  . Transportation needs    Medical: Not on file    Non-medical: Not on file  Tobacco Use  . Smoking status: Former Smoker    Types: Pipe  . Smokeless tobacco: Never Used  Substance and Sexual Activity  . Alcohol use: Yes    Comment: "1/5 a week"  . Drug use: No  . Sexual activity: Not on file  Lifestyle  . Physical activity    Days per week: Not on file    Minutes per session: Not on file  . Stress: Not on file  Relationships  . Social  connections    Talks on phone: Not on file    Gets together: Not on file    Attends religious service: Not on file    Active member of club or organization: Not on file    Attends meetings of clubs or organizations: Not on file    Relationship status: Not on file  . Intimate partner violence    Fear of current or ex partner: Not on file    Emotionally abused: Not on file    Physically abused: Not on file    Forced sexual activity: Not on file  Other Topics Concern  . Not on file  Social History Narrative  . Not on file    Family History  Problem Relation Age of Onset  . Heart disease Father   . Hypertension Sister     Ht 6\' 2"  (1.88 m)   Wt 215 lb (97.5 kg)   BMI 27.60 kg/m   Review of Systems: See HPI  above.     Objective:  Physical Exam:  Gen: NAD, comfortable in exam room  Back: No gross deformity, scoliosis. No TTP.  No midline or bony TTP. FROM with pain on lateral rotation into posterior thighs. Strength LEs 5/5 all muscle groups except 4/5 with bilateral hip flexion.   1+ MSRs in patellar and achilles tendons, equal bilaterally. Negative SLRs. Sensation intact to light touch bilaterally.  Bilateral hips: No deformity. FROM with 5/5 strength except 4/5 bilateral hip flexion. No tenderness to palpation. NVI distally. Negative logroll bilateral hips Negative fabers and piriformis stretches.   Assessment & Plan:  1. Low back pain radiating into both legs - concerning for progressive spinal stenosis.  He has some weakness on exam but more concerning is very limited ability to ambulate without significant worsening.  Pain better with flexion of back, worse with extension also.  Discussed options - independently reviewed radiographs noting moderate degenerative changes in low back.  Patient is on eliquis - avoiding NSAIDs and prednisone.  Topical medications, tylenol reviewed.  Discussed formal physical therapy but given severity and weakness will go ahead with MRI to further assess, consider referral to neurosurgery based on results.

## 2019-04-24 ENCOUNTER — Other Ambulatory Visit: Payer: Self-pay

## 2019-04-24 ENCOUNTER — Ambulatory Visit (INDEPENDENT_AMBULATORY_CARE_PROVIDER_SITE_OTHER): Payer: Medicare HMO

## 2019-04-24 DIAGNOSIS — M545 Low back pain, unspecified: Secondary | ICD-10-CM

## 2019-04-24 DIAGNOSIS — M79604 Pain in right leg: Secondary | ICD-10-CM

## 2019-04-24 NOTE — Addendum Note (Signed)
Addended by: Sherrie George F on: 04/24/2019 10:20 AM   Modules accepted: Orders

## 2019-04-25 ENCOUNTER — Other Ambulatory Visit: Payer: Self-pay | Admitting: Neurosurgery

## 2019-04-27 ENCOUNTER — Encounter (HOSPITAL_COMMUNITY): Payer: Self-pay

## 2019-04-27 NOTE — Pre-Procedure Instructions (Addendum)
Lance Hubbard  04/27/2019    Your procedure is scheduled on Thursday, May 04, 2019 at 11:55 AM.   Report to [facility] Entrance "A" Admitting Office at 10:00 AM.   Call this number if you have problems the morning of surgery: [phone]   Questions prior to day of surgery, please call [phone] between 8 & 4 PM.   Remember:  Do not eat or drink after midnight Wednesday, 05/03/19.  Take these medicines the morning of surgery with A SIP OF WATER: Acetaminophen (Tylenol)  Stop Eliquis 3 days prior to surgery per Dr. Zenia Resides instructions.  Do not use any NSAIDS (Ibuprofen, Aleve, etc), Aspirin products (BC Powders, Goody's, etc), Herbal medications or Multivitamins prior to surgery.    Do not wear jewelry.  Do not wear lotions, powders, cologne or deodorant.   Men may shave face and neck.  Do not bring valuables to the hospital.  [facility] is not responsible for any belongings or valuables.  Contacts, dentures or bridgework may not be worn into surgery.  Leave your suitcase in the car.  After surgery it may be brought to your room.  For patients admitted to the hospital, discharge time will be determined by your treatment team.  Patients discharged the day of surgery will not be allowed to drive home.   [place] - Preparing for Surgery  Before surgery, you can play an important role.  Because skin is not sterile, your skin needs to be as free of germs as possible.  You can reduce the number of germs on you skin by washing with CHG (chlorahexidine gluconate) soap before surgery.  CHG is an antiseptic cleaner which kills germs and bonds with the skin to continue killing germs even after washing.  Oral Hygiene is also important in reducing the risk of infection.  Remember to brush your teeth with your regular toothpaste the morning of surgery.  Please DO NOT use if you have an allergy to CHG or antibacterial soaps.  If your skin becomes reddened/irritated stop using  the CHG and inform your nurse when you arrive at Short Stay.  Do not shave (including legs and underarms) for at least 48 hours prior to the first CHG shower.  You may shave your face.  Please follow these instructions carefully:   1.  Shower with CHG Soap the night before surgery and the morning of Surgery.  2.  If you choose to wash your hair, wash your hair first as usual with your normal shampoo.  3.  After you shampoo, rinse your hair and body thoroughly to remove the shampoo. 4.  Use CHG as you would any other liquid soap.  You can apply chg directly to the skin and wash gently with a      scrungie or washcloth.           5.  Apply the CHG Soap to your body ONLY FROM THE NECK DOWN.   Do not use on open wounds or open sores. Avoid contact with your eyes, ears, mouth and genitals (private parts).  Wash genitals (private parts) with your normal soap.  6.  Wash thoroughly, paying special attention to the area where your surgery will be performed.  7.  Thoroughly rinse your body with warm water from the neck down.  8.  DO NOT shower/wash with your normal soap after using and rinsing off the CHG Soap.  9.  Pat yourself dry with a clean towel.  10.  Wear clean pajamas.            11.  Place clean sheets on your bed the night of your first shower and do not sleep with pets.  Day of Surgery  Shower as above. Do not apply any lotions/deodorants the morning of surgery.   Please wear clean clothes to the hospital. Remember to brush your teeth with toothpaste.   Please read over the fact sheets that you were given.

## 2019-05-01 ENCOUNTER — Other Ambulatory Visit: Payer: Self-pay | Admitting: Neurosurgery

## 2019-05-01 ENCOUNTER — Encounter (HOSPITAL_COMMUNITY): Payer: Self-pay

## 2019-05-01 ENCOUNTER — Other Ambulatory Visit: Payer: Self-pay

## 2019-05-01 ENCOUNTER — Encounter (HOSPITAL_COMMUNITY)
Admission: RE | Admit: 2019-05-01 | Discharge: 2019-05-01 | Disposition: A | Discharge status: Home / Self Care | Payer: Medicare HMO | Source: Ambulatory Visit | Attending: Neurosurgery | Admitting: Neurosurgery

## 2019-05-01 ENCOUNTER — Other Ambulatory Visit (HOSPITAL_COMMUNITY)
Admission: RE | Admit: 2019-05-01 | Discharge: 2019-05-01 | Disposition: A | Discharge status: Home / Self Care | Payer: Medicare HMO | Source: Ambulatory Visit | Attending: Neurosurgery | Admitting: Neurosurgery

## 2019-05-01 DIAGNOSIS — Z01811 Encounter for preprocedural respiratory examination: Secondary | ICD-10-CM | POA: Diagnosis not present

## 2019-05-01 DIAGNOSIS — Z87891 Personal history of nicotine dependence: Secondary | ICD-10-CM | POA: Diagnosis not present

## 2019-05-01 DIAGNOSIS — Z7901 Long term (current) use of anticoagulants: Secondary | ICD-10-CM | POA: Diagnosis not present

## 2019-05-01 DIAGNOSIS — Z1159 Encounter for screening for other viral diseases: Secondary | ICD-10-CM | POA: Diagnosis not present

## 2019-05-01 DIAGNOSIS — Z01818 Encounter for other preprocedural examination: Secondary | ICD-10-CM | POA: Diagnosis not present

## 2019-05-01 DIAGNOSIS — M48061 Spinal stenosis, lumbar region without neurogenic claudication: Secondary | ICD-10-CM | POA: Diagnosis not present

## 2019-05-01 DIAGNOSIS — D72829 Elevated white blood cell count, unspecified: Secondary | ICD-10-CM | POA: Diagnosis not present

## 2019-05-01 DIAGNOSIS — I10 Essential (primary) hypertension: Secondary | ICD-10-CM | POA: Diagnosis not present

## 2019-05-01 DIAGNOSIS — Z79899 Other long term (current) drug therapy: Secondary | ICD-10-CM | POA: Diagnosis not present

## 2019-05-01 HISTORY — DX: Cardiac arrhythmia, unspecified: I49.9

## 2019-05-01 HISTORY — DX: Unspecified osteoarthritis, unspecified site: M19.90

## 2019-05-01 HISTORY — DX: Personal history of urinary calculi: Z87.442

## 2019-05-01 LAB — COMPREHENSIVE METABOLIC PANEL
ALT: 18 U/L (ref 0–44)
AST: 32 U/L (ref 15–41)
Albumin: 3.9 g/dL (ref 3.5–5.0)
Alkaline Phosphatase: 42 U/L (ref 38–126)
Anion gap: 11 (ref 5–15)
BUN: 16 mg/dL (ref 8–23)
CO2: 22 mmol/L (ref 22–32)
Calcium: 9 mg/dL (ref 8.9–10.3)
Chloride: 102 mmol/L (ref 98–111)
Creatinine, Ser: 0.94 mg/dL (ref 0.61–1.24)
GFR calc Af Amer: >60 mL/min (ref >60)
GFR calc non Af Amer: >60 mL/min (ref >60)
Glucose, Bld: 84 mg/dL (ref 70–99)
Potassium: 4.8 mmol/L (ref 3.5–5.1)
Sodium: 135 mmol/L (ref 135–145)
Total Bilirubin: 1.2 mg/dL (ref 0.3–1.2)
Total Protein: 6.6 g/dL (ref 6.5–8.1)

## 2019-05-01 LAB — CBC
HCT: 45.7 % (ref 39.0–52.0)
Hemoglobin: 14.8 g/dL (ref 13.0–17.0)
MCH: 30.5 pg (ref 26.0–34.0)
MCHC: 32.4 g/dL (ref 30.0–36.0)
MCV: 94.2 fL (ref 80.0–100.0)
Platelets: 195 10*3/uL (ref 150–400)
RBC: 4.85 MIL/uL (ref 4.22–5.81)
RDW: 14.3 % (ref 11.5–15.5)
WBC: 25.6 10*3/uL — ABNORMAL HIGH (ref 4.0–10.5)
nRBC: 0 % (ref 0.0–0.2)

## 2019-05-01 LAB — SURGICAL PCR SCREEN
MRSA, PCR: NEGATIVE
Staphylococcus aureus: NEGATIVE

## 2019-05-01 NOTE — Progress Notes (Addendum)
PCP - Dr. Kathryne Eriksson Cardiologist -  Dr. Terrence Dupont  Chest x-ray - n/a EKG - today Stress Test - 12/17/13 ECHO - 12/18/13 Cardiac Cath - 12/19/13  Sleep Study - n/a CPAP -   Fasting Blood Sugar - n/a Checks Blood Sugar _____ times a day  Blood Thinner Instructions: Instructed to stop 3 days prior to surgery per Dr. Terrence Dupont, last dose 04/30/19 Aspirin Instructions: n/a  Anesthesia review: yes, hx A-fib and labs today WBC is 25.6 - have left a message for Lorriane Shire at Dr. Donnella Bi office Sherron Flemings is out of the office until 05/08/19)  Patient denies shortness of breath, fever, cough and chest pain at PAT appointment   Patient verbalized understanding of instructions that were given to them at the PAT appointment. Patient was also instructed that they will need to review over the PAT instructions again at home before surgery.   Pt has appt for Covid testing after this appointment. Instructed on quarantine after test. Pt voiced understanding.    Coronavirus Screening  Have you experienced the following symptoms:  Cough NO Fever (>100.24F)  NO Runny nose NO Sore throat NO Difficulty breathing/shortness of breath  NO  Have you or a family member traveled in the last 14 days and where? NO  Patient reminded that hospital visitation restrictions are in effect and the importance of the restrictions.

## 2019-05-01 NOTE — Progress Notes (Signed)
Jessica from Dr. Donnella Bi office called about pt's high white count. She states Dr. Sherwood Gambler wants pt to come back tomorrow to get an UA, urine culture and CXR.

## 2019-05-02 ENCOUNTER — Ambulatory Visit (HOSPITAL_COMMUNITY)
Admission: RE | Admit: 2019-05-02 | Discharge: 2019-05-02 | Disposition: A | Discharge status: Home / Self Care | Payer: Medicare HMO | Source: Ambulatory Visit | Attending: Neurosurgery | Admitting: Neurosurgery

## 2019-05-02 ENCOUNTER — Encounter (HOSPITAL_COMMUNITY)
Admission: RE | Admit: 2019-05-02 | Discharge: 2019-05-02 | Disposition: A | Discharge status: Home / Self Care | Payer: Medicare HMO | Source: Ambulatory Visit | Attending: Neurosurgery | Admitting: Neurosurgery

## 2019-05-02 DIAGNOSIS — Z01818 Encounter for other preprocedural examination: Secondary | ICD-10-CM | POA: Insufficient documentation

## 2019-05-02 DIAGNOSIS — Z01811 Encounter for preprocedural respiratory examination: Secondary | ICD-10-CM

## 2019-05-02 DIAGNOSIS — Z1159 Encounter for screening for other viral diseases: Secondary | ICD-10-CM | POA: Insufficient documentation

## 2019-05-02 DIAGNOSIS — M48061 Spinal stenosis, lumbar region without neurogenic claudication: Secondary | ICD-10-CM | POA: Insufficient documentation

## 2019-05-02 DIAGNOSIS — D72829 Elevated white blood cell count, unspecified: Secondary | ICD-10-CM | POA: Insufficient documentation

## 2019-05-02 DIAGNOSIS — I10 Essential (primary) hypertension: Secondary | ICD-10-CM | POA: Insufficient documentation

## 2019-05-02 DIAGNOSIS — Z7901 Long term (current) use of anticoagulants: Secondary | ICD-10-CM | POA: Insufficient documentation

## 2019-05-02 DIAGNOSIS — Z87891 Personal history of nicotine dependence: Secondary | ICD-10-CM | POA: Insufficient documentation

## 2019-05-02 DIAGNOSIS — Z79899 Other long term (current) drug therapy: Secondary | ICD-10-CM | POA: Insufficient documentation

## 2019-05-02 LAB — URINALYSIS, ROUTINE W REFLEX MICROSCOPIC
Bilirubin Urine: NEGATIVE
Glucose, UA: NEGATIVE mg/dL
Hgb urine dipstick: NEGATIVE
Ketones, ur: NEGATIVE mg/dL
Leukocytes,Ua: NEGATIVE
Nitrite: NEGATIVE
Protein, ur: NEGATIVE mg/dL
Specific Gravity, Urine: 1.021 (ref 1.005–1.030)
pH: 6 (ref 5.0–8.0)

## 2019-05-02 LAB — SARS CORONAVIRUS 2 (TAT 6-24 HRS): SARS Coronavirus 2: NEGATIVE

## 2019-05-02 NOTE — Progress Notes (Signed)
Pt returned today for a CXR, UA and urine culture. Pt tells me today that he is using a shampoo that is a steroid. He wanted to make sure we knew that.

## 2019-05-03 ENCOUNTER — Telehealth: Payer: Self-pay | Admitting: Hematology

## 2019-05-03 LAB — URINE CULTURE: Culture: NO GROWTH

## 2019-05-03 NOTE — Telephone Encounter (Signed)
Called and spoke with referring office and referral was transferred to [facility].  Date/time/location was given to referring office and they will contact patient.

## 2019-05-04 ENCOUNTER — Ambulatory Visit (HOSPITAL_COMMUNITY): Admission: RE | Admit: 2019-05-04 | Payer: Medicare HMO | Source: Home / Self Care | Admitting: Neurosurgery

## 2019-05-04 ENCOUNTER — Encounter (HOSPITAL_COMMUNITY): Admission: RE | Payer: Self-pay | Source: Home / Self Care

## 2019-05-04 SURGERY — LUMBAR LAMINECTOMY/DECOMPRESSION MICRODISCECTOMY 1 LEVEL
Anesthesia: General

## 2019-05-08 ENCOUNTER — Other Ambulatory Visit: Payer: Self-pay | Admitting: Hematology

## 2019-05-08 DIAGNOSIS — C9210 Chronic myeloid leukemia, BCR/ABL-positive, not having achieved remission: Secondary | ICD-10-CM | POA: Insufficient documentation

## 2019-05-08 DIAGNOSIS — D72829 Elevated white blood cell count, unspecified: Secondary | ICD-10-CM

## 2019-05-08 NOTE — Progress Notes (Signed)
[name]  Patient Care Team: Christain Sacramento, MD as PCP - General (Family Medicine)  HEME/ONC OVERVIEW: 1. Leukocytosis -WBC 25.6k in 2020, no diff; CBC normal in 2019   ASSESSMENT & PLAN:   Leukocytosis -I reviewed the patient records in detail, including PCP clinic notes, lab studies, and imaging results -I also independently reviewed the radiologic images of recent MRI 03/2019, and agree with findings documented -In summary, patient has had relatively normal CBCs since 2018 (results available in Care Everywhere).  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  He presented to his PCP in 04/2019 for routine follow-up, and labs were notable for elevated WBC of 25.6k (no diff).  -Clinically, patient denies any symptoms of infection; he did complete a course of topical clobetasol shampoo for psoriasis, which is considered a relatively high potency steroid  -WBC 24.6k today, stable -I personally reviewed the patient's peripheral blood smear, which showed increased number of neutrophils, as well as some immature granulocytes, including metamyelocytes and bands, suggesting inflammatory vs reactive process (such as steroid use); there was no circulating blast; RBC and platelet morphology was normal -Given the patient's extensive degenerative disc disease and recent high-potency topical steroid use, I suspect that the leukocytosis is most likely reactive/inflammatory -I have ordered inflammatory markers, including ESR and CRP, as well as MPN panel and BCR/ABL FISH, to rule out myeloproliferative disease (results should be available in approximately one week) -In light of the patient's severe spinal stenosis affecting his mobility and strength, the mild leukocytosis alone should not significantly affect the  outcome of the surgery; with that said, I will defer the ultimate decision and timing of the surgery to Dr. Sherwood Gambler of neurosurgery   Orders Placed This Encounter  Procedures  . CBC with Differential (Cancer Center Only)    Standing Status:   Future    Standing Expiration Date:   06/12/2020  . CMP ([place] only)    Standing Status:   Future    Standing Expiration Date:   06/12/2020  . Save Smear ([facility])    Standing Status:   Future    Standing Expiration Date:   05/08/2020  . Lactate dehydrogenase    Standing Status:   Future    Standing Expiration Date:   06/12/2020   All questions were answered. The patient knows to call the clinic with any problems, questions or concerns.  Return in 3 months for labs and clinic follow-up.   Lance Men, MD 05/09/2019 9:36 AM   CHIEF COMPLAINTS/PURPOSE OF CONSULTATION:  "I need to have my back surgery done"  HISTORY OF PRESENTING ILLNESS:  Lance Hubbard 78 y.o. male is here because of new onset leukocytosis.  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  He reports that he was originally scheduled to undergo surgical intervention for the severe spinal stenosis, but the surgery was cancelled due to the leukocytosis, and he was referred to hematology for further evaluation.  He is concerned about the progressive back pain affecting his mobility and strength in the lower extremities.  In addition, he has psoriasis of the scalp, for which he has been using periodic topical clobetasol (daily x 28 days in 10/2018  and again daily x one week starting in the beginning of July 2020).  He denies any constitutional symptoms, such as fever, chill, weight loss, night sweats, chest pain, dyspnea, cough, sputum production, dysuria, hematuria, or other symptoms of infection.  He does not smoke, drink or use illicit drugs.  He  denies any recent medication changes.   MEDICAL HISTORY:  Past Medical History:  Diagnosis Date  . Arthritis   . Dysrhythmia    Atrial Fibrillation  . History of kidney stones   . Hypertension     SURGICAL HISTORY: Past Surgical History:  Procedure Laterality Date  . CARDIOVERSION N/A 12/18/2013   Procedure: CARDIOVERSION;  Surgeon: Birdie Riddle, MD;  Location: [facility] ENDOSCOPY;  Service: Cardiovascular;  Laterality: N/A;  9:58 Lido 40 mg, IV Propofol 40 mg, IV for anesthesia,  synched cardioversio @ 120 joules with successful NSR   . COLONOSCOPY    . KNEE ARTHROSCOPY    . knee dsurgery    . LEFT HEART CATHETERIZATION WITH CORONARY ANGIOGRAM N/A 12/19/2013   Procedure: LEFT HEART CATHETERIZATION WITH CORONARY ANGIOGRAM;  Surgeon: Clent Demark, MD;  Location: [facility] CATH LAB;  Service: Cardiovascular;  Laterality: N/A;  . TEE WITHOUT CARDIOVERSION N/A 12/18/2013   Procedure: TRANSESOPHAGEAL ECHOCARDIOGRAM (TEE);  Surgeon: Birdie Riddle, MD;  Location: [facility] ENDOSCOPY;  Service: Cardiovascular;  Laterality: N/A;    SOCIAL HISTORY: Social History   Socioeconomic History  . Marital status: Married    Spouse name: Not on file  . Number of children: Not on file  . Years of education: Not on file  . Highest education level: Not on file  Occupational History  . Not on file  Social Needs  . Financial resource strain: Not on file  . Food insecurity    Worry: Not on file    Inability: Not on file  . Transportation needs    Medical: Not on file    Non-medical: Not on file  Tobacco Use  . Smoking status: Former Smoker    Types: Pipe    Quit date: 11/08/2018    Years since quitting: 0.4  . Smokeless tobacco: Never Used  Substance and Sexual Activity  . Alcohol use: Not Currently    Comment: stopped around 2012  . Drug use: No  . Sexual activity: Not on file  Lifestyle  . Physical activity    Days per week: Not on file    Minutes per session: Not on file  . Stress: Not on file   Relationships  . Social Herbalist on phone: Not on file    Gets together: Not on file    Attends religious service: Not on file    Active member of club or organization: Not on file    Attends meetings of clubs or organizations: Not on file    Relationship status: Not on file  . Intimate partner violence    Fear of current or ex partner: Not on file    Emotionally abused: Not on file    Physically abused: Not on file    Forced sexual activity: Not on file  Other Topics Concern  . Not on file  Social History Narrative  . Not on file    FAMILY HISTORY: Family History  Problem Relation Age of Onset  . Heart disease Father   . Hypertension Sister     ALLERGIES:  is allergic to amoxicillin.  MEDICATIONS:  Current Outpatient Medications  Medication Sig Dispense Refill  .  acetaminophen (TYLENOL) 500 MG tablet Take 1,500 mg by mouth 2 (two) times a day.    [name] amiodarone (PACERONE) 200 MG tablet Take 1 tablet (200 mg total) by mouth 2 (two) times daily. (Patient taking differently: Take 100 mg by mouth every Monday, Wednesday, and Friday. ) 30 tablet 3  . apixaban (ELIQUIS) 5 MG TABS tablet Take 1 tablet (5 mg total) by mouth every 12 (twelve) hours. 60 tablet 3  . clobetasol (TEMOVATE) 0.05 % external solution Apply 1 application topically 2 (two) times daily as needed (for bug bites).     [name] losartan-hydrochlorothiazide (HYZAAR) 100-12.5 MG tablet Take 1 tablet by mouth daily.     Vladimir Faster Glycol-Propyl Glycol (SYSTANE ULTRA) 0.4-0.3 % SOLN Place 1-2 drops into both eyes 3 (three) times daily as needed (for dry eyes).     No current facility-administered medications for this visit.     REVIEW OF SYSTEMS:   Constitutional: ( - ) fevers, ( - )  chills , ( - ) night sweats Eyes: ( - ) blurriness of vision, ( - ) double vision, ( - ) watery eyes Ears, nose, mouth, throat, and face: ( - ) mucositis, ( - ) sore throat Respiratory: ( - ) cough, ( - ) dyspnea, ( - )  wheezes Cardiovascular: ( - ) palpitation, ( - ) chest discomfort, ( - ) lower extremity swelling Gastrointestinal:  ( - ) nausea, ( - ) heartburn, ( - ) change in bowel habits Skin: ( - ) abnormal skin rashes Lymphatics: ( - ) new lymphadenopathy, ( - ) easy bruising Neurological: ( - ) numbness, ( - ) tingling, ( - ) new weaknesses Behavioral/Psych: ( - ) mood change, ( - ) new changes  All other systems were reviewed with the patient and are negative.  PHYSICAL EXAMINATION: ECOG PERFORMANCE STATUS: 1 - Symptomatic but completely ambulatory  Vitals:   05/09/19 0905  BP: 130/88  Pulse: 63  Resp: 19  Temp: 98 F (36.7 C)  SpO2: 100%   Filed Weights   05/09/19 0905  Weight: 217 lb 1.9 oz (98.5 kg)    GENERAL: alert, no distress, slightly uncomfortable due to back pain  EYES: conjunctiva are pink and non-injected, sclera clear OROPHARYNX: no exudate, no erythema; lips, buccal mucosa, and tongue normal  NECK: supple, non-tender LYMPH:  no palpable lymphadenopathy in the cervical LUNGS: clear to auscultation with normal breathing effort HEART: regular rate & rhythm, no murmurs, no lower extremity edema ABDOMEN: soft, non-tender, non-distended, normal bowel sounds Musculoskeletal: slowed gait due to back pain  PSYCH: alert & oriented x 3, fluent speech  LABORATORY DATA:  I have reviewed the data as listed Lab Results  Component Value Date   WBC 24.6 (H) 05/09/2019   HGB 14.8 05/09/2019   HCT 44.7 05/09/2019   MCV 92.5 05/09/2019   PLT 210 05/09/2019   Lab Results  Component Value Date   NA 139 05/09/2019   K 3.6 05/09/2019   CL 101 05/09/2019   CO2 29 05/09/2019    RADIOGRAPHIC STUDIES: I have personally reviewed the radiological images as listed and agreed with the findings in the report. Dg Chest 2 View  Result Date: 05/02/2019 CLINICAL DATA:  Pre-admit for lumbar stenosis. EXAM: CHEST - 2 VIEW COMPARISON:  December 16, 2013 FINDINGS: The heart size and  mediastinal contours are within normal limits. Both lungs are clear. The visualized skeletal structures are unremarkable. IMPRESSION: No active cardiopulmonary disease. Electronically Signed   By:  Dorise Bullion III M.D   On: 05/02/2019 10:43   Dg Lumbar Spine 2-3 Views  Result Date: 04/17/2019 CLINICAL DATA:  Lumbago with radicular symptoms EXAM: LUMBAR SPINE - 2-3 VIEW COMPARISON:  Lumbar MRI May 04, 2010 FINDINGS: Frontal, lateral, and spot lumbosacral lateral images were obtained. There are 5 non-rib-bearing lumbar type vertebral bodies. There is no fracture or spondylolisthesis. There is moderate disc space narrowing at L4-5 and L5-S1. There is also moderate disc space narrowing at L1-2. No erosive changes are evident. There are prominent anterior osteophytes at L4 and L5. There is facet osteoarthritic change at L4-5 and L5-S1 bilaterally. There is aortic atherosclerosis. IMPRESSION: Osteoarthritic change at several levels, most notably at L4-5 and L5-S1. No fracture or spondylolisthesis. Aortic Atherosclerosis (ICD10-I70.0). Electronically Signed   By: Lowella Grip III M.D.   On: 04/17/2019 12:15   Mr Lumbar Spine Wo Contrast  Result Date: 04/24/2019 CLINICAL DATA:  Lumbar radiculopathy.  Low back pain EXAM: MRI LUMBAR SPINE WITHOUT CONTRAST TECHNIQUE: Multiplanar, multisequence MR imaging of the lumbar spine was performed. No intravenous contrast was administered. COMPARISON:  Lumbar MRI 05/04/2010 FINDINGS: Segmentation:  Normal Alignment:  Normal Vertebrae:  Negative for fracture or mass. Conus medullaris and cauda equina: Conus extends to the L1-2 level. Conus and cauda equina appear normal. Paraspinal and other soft tissues: Negative for paraspinous mass or adenopathy. Disc levels: L1-2: Mild disc degeneration.  Negative for stenosis L2-3: Mild disc and mild facet degeneration.  Negative for stenosis. L3-4: Disc bulging and facet hypertrophy. Mild spinal stenosis. Neural foramina patent  bilaterally L4-5: Broad-based central disc protrusion. Moderate to severe facet degeneration with facet and ligament hypertrophy and bilateral facet joint effusions. Severe spinal stenosis and severe subarticular stenosis bilaterally. L5-S1: Disc degeneration and spondylosis. Bilateral facet hypertrophy. Moderate to severe subarticular and foraminal stenosis bilaterally. 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 Significant progression since 2011 Moderate to severe subarticular and foraminal stenosis bilaterally L5-S1 with mild progression since 2011. Electronically Signed   By: Franchot Gallo M.D.   On: 04/24/2019 20:33    PATHOLOGY: I personally reviewed the patient's peripheral blood smear, which showed increased number of neutrophils, as well as some immature granulocytes, including metamyelocytes and bands, suggesting inflammatory vs reactive process (such as steroid use); there was no circulating blast; RBC and platelet morphology was normal.

## 2019-05-09 ENCOUNTER — Encounter: Payer: Self-pay | Admitting: Hematology

## 2019-05-09 ENCOUNTER — Inpatient Hospital Stay: Payer: Medicare HMO | Attending: Hematology

## 2019-05-09 ENCOUNTER — Other Ambulatory Visit: Payer: Self-pay | Admitting: Neurosurgery

## 2019-05-09 ENCOUNTER — Other Ambulatory Visit: Payer: Self-pay

## 2019-05-09 ENCOUNTER — Inpatient Hospital Stay (HOSPITAL_BASED_OUTPATIENT_CLINIC_OR_DEPARTMENT_OTHER): Payer: Medicare HMO | Admitting: Hematology

## 2019-05-09 ENCOUNTER — Telehealth: Payer: Self-pay | Admitting: Hematology

## 2019-05-09 DIAGNOSIS — L409 Psoriasis, unspecified: Secondary | ICD-10-CM | POA: Diagnosis not present

## 2019-05-09 DIAGNOSIS — I4891 Unspecified atrial fibrillation: Secondary | ICD-10-CM | POA: Diagnosis not present

## 2019-05-09 DIAGNOSIS — K5903 Drug induced constipation: Secondary | ICD-10-CM | POA: Diagnosis not present

## 2019-05-09 DIAGNOSIS — D72829 Elevated white blood cell count, unspecified: Secondary | ICD-10-CM | POA: Diagnosis not present

## 2019-05-09 DIAGNOSIS — M48061 Spinal stenosis, lumbar region without neurogenic claudication: Secondary | ICD-10-CM | POA: Diagnosis not present

## 2019-05-09 DIAGNOSIS — Z87891 Personal history of nicotine dependence: Secondary | ICD-10-CM | POA: Diagnosis not present

## 2019-05-09 DIAGNOSIS — I428 Other cardiomyopathies: Secondary | ICD-10-CM | POA: Insufficient documentation

## 2019-05-09 LAB — CMP (CANCER CENTER ONLY)
ALT: 15 U/L (ref 0–44)
AST: 13 U/L — ABNORMAL LOW (ref 15–41)
Albumin: 4.3 g/dL (ref 3.5–5.0)
Alkaline Phosphatase: 45 U/L (ref 38–126)
Anion gap: 9 (ref 5–15)
BUN: 17 mg/dL (ref 8–23)
CO2: 29 mmol/L (ref 22–32)
Calcium: 8.6 mg/dL — ABNORMAL LOW (ref 8.9–10.3)
Chloride: 101 mmol/L (ref 98–111)
Creatinine: 0.92 mg/dL (ref 0.61–1.24)
GFR, Est AFR Am: >60 mL/min (ref >60)
GFR, Estimated: >60 mL/min (ref >60)
Glucose, Bld: 106 mg/dL — ABNORMAL HIGH (ref 70–99)
Potassium: 3.6 mmol/L (ref 3.5–5.1)
Sodium: 139 mmol/L (ref 135–145)
Total Bilirubin: 0.5 mg/dL (ref 0.3–1.2)
Total Protein: 6.9 g/dL (ref 6.5–8.1)

## 2019-05-09 LAB — CBC WITH DIFFERENTIAL (CANCER CENTER ONLY)
Abs Immature Granulocytes: 2.94 10*3/uL — ABNORMAL HIGH (ref 0.00–0.07)
Basophils Absolute: 0.6 10*3/uL — ABNORMAL HIGH (ref 0.0–0.1)
Basophils Relative: 2 %
Eosinophils Absolute: 0.5 10*3/uL (ref 0.0–0.5)
Eosinophils Relative: 2 %
HCT: 44.7 % (ref 39.0–52.0)
Hemoglobin: 14.8 g/dL (ref 13.0–17.0)
Immature Granulocytes: 12 %
Lymphocytes Relative: 20 %
Lymphs Abs: 4.8 10*3/uL — ABNORMAL HIGH (ref 0.7–4.0)
MCH: 30.6 pg (ref 26.0–34.0)
MCHC: 33.1 g/dL (ref 30.0–36.0)
MCV: 92.5 fL (ref 80.0–100.0)
Monocytes Absolute: 1.6 10*3/uL — ABNORMAL HIGH (ref 0.1–1.0)
Monocytes Relative: 7 %
Neutro Abs: 14.2 10*3/uL — ABNORMAL HIGH (ref 1.7–7.7)
Neutrophils Relative %: 57 %
Platelet Count: 210 10*3/uL (ref 150–400)
RBC: 4.83 MIL/uL (ref 4.22–5.81)
RDW: 14.2 % (ref 11.5–15.5)
WBC Count: 24.6 10*3/uL — ABNORMAL HIGH (ref 4.0–10.5)
nRBC: 0 % (ref 0.0–0.2)

## 2019-05-09 LAB — SEDIMENTATION RATE: Sed Rate: 5 mm/hr (ref 0–16)

## 2019-05-09 LAB — LACTATE DEHYDROGENASE: LDH: 197 U/L — ABNORMAL HIGH (ref 98–192)

## 2019-05-09 LAB — SAVE SMEAR(SSMR), FOR PROVIDER SLIDE REVIEW

## 2019-05-09 LAB — C-REACTIVE PROTEIN: CRP: <0.8 mg/dL (ref <1.0)

## 2019-05-09 NOTE — Telephone Encounter (Signed)
Called and spoke with patient regarding appointments added per 7/14 los

## 2019-05-13 ENCOUNTER — Other Ambulatory Visit (HOSPITAL_COMMUNITY)
Admission: RE | Admit: 2019-05-13 | Discharge: 2019-05-13 | Disposition: A | Discharge status: Home / Self Care | Payer: Medicare HMO | Source: Ambulatory Visit | Attending: Neurosurgery | Admitting: Neurosurgery

## 2019-05-13 DIAGNOSIS — Z1159 Encounter for screening for other viral diseases: Secondary | ICD-10-CM | POA: Diagnosis present

## 2019-05-13 LAB — SARS CORONAVIRUS 2 (TAT 6-24 HRS): SARS Coronavirus 2: NEGATIVE

## 2019-05-15 ENCOUNTER — Other Ambulatory Visit: Payer: Self-pay

## 2019-05-15 ENCOUNTER — Encounter (HOSPITAL_COMMUNITY): Payer: Self-pay | Admitting: *Deleted

## 2019-05-15 NOTE — Progress Notes (Signed)
Spoke with pt and wife for pre-op call. Pt had a PAT appt on 05/01/19 and surgery got cancelled due to an elevated white count. Pt has been seen by Dr. Maylon Peppers at the [facility] and has clearance from Dr. Maylon Peppers. Pt states nothing has changed with his allergies, medications, medical and surgical history. Pt denies any recent chest pain or sob. Pt's last dose of Eliquis was 05/11/19.  Instructed pt to follow written instructions that he received on 05/01/19. Pt was tested for Covid 19 on 05/13/19 and it is negative. He states he has been in quarantine since.   Coronavirus Screening  Have you experienced the following symptoms:  Cough NO Fever (>100.8F)  NO Runny nose NO Sore throat NO Difficulty breathing/shortness of breath  NO  Have you or a family member traveled in the last 14 days and where? NO    Patient and wife reminded that hospital visitation restrictions are in effect and the importance of the restrictions.

## 2019-05-16 MED ORDER — VANCOMYCIN HCL 10 G IV SOLR
1500.0000 mg | INTRAVENOUS | Status: AC
  Administered 2019-05-17: 1500 mg via INTRAVENOUS
  Filled 2019-05-16 (×2): qty 1500

## 2019-05-17 ENCOUNTER — Observation Stay (HOSPITAL_COMMUNITY)
Admission: RE | Admit: 2019-05-17 | Discharge: 2019-05-17 | Disposition: A | Discharge status: Home / Self Care | Payer: Medicare HMO | Attending: Neurosurgery | Admitting: Neurosurgery

## 2019-05-17 ENCOUNTER — Other Ambulatory Visit: Payer: Self-pay

## 2019-05-17 ENCOUNTER — Ambulatory Visit (HOSPITAL_COMMUNITY): Payer: Medicare HMO | Admitting: Certified Registered Nurse Anesthetist

## 2019-05-17 ENCOUNTER — Ambulatory Visit (HOSPITAL_COMMUNITY): Payer: Medicare HMO

## 2019-05-17 ENCOUNTER — Encounter (HOSPITAL_COMMUNITY)
Admission: RE | Disposition: A | Discharge status: Home / Self Care | Payer: Self-pay | Source: Home / Self Care | Attending: Neurosurgery

## 2019-05-17 ENCOUNTER — Encounter (HOSPITAL_COMMUNITY): Payer: Self-pay

## 2019-05-17 DIAGNOSIS — I10 Essential (primary) hypertension: Secondary | ICD-10-CM | POA: Insufficient documentation

## 2019-05-17 DIAGNOSIS — I4891 Unspecified atrial fibrillation: Secondary | ICD-10-CM | POA: Insufficient documentation

## 2019-05-17 DIAGNOSIS — Z79899 Other long term (current) drug therapy: Secondary | ICD-10-CM | POA: Diagnosis not present

## 2019-05-17 DIAGNOSIS — Z7901 Long term (current) use of anticoagulants: Secondary | ICD-10-CM | POA: Diagnosis not present

## 2019-05-17 DIAGNOSIS — M48062 Spinal stenosis, lumbar region with neurogenic claudication: Principal | ICD-10-CM | POA: Diagnosis present

## 2019-05-17 DIAGNOSIS — M7138 Other bursal cyst, other site: Secondary | ICD-10-CM | POA: Insufficient documentation

## 2019-05-17 DIAGNOSIS — Z87891 Personal history of nicotine dependence: Secondary | ICD-10-CM | POA: Insufficient documentation

## 2019-05-17 DIAGNOSIS — Z419 Encounter for procedure for purposes other than remedying health state, unspecified: Secondary | ICD-10-CM

## 2019-05-17 HISTORY — PX: LUMBAR LAMINECTOMY/DECOMPRESSION MICRODISCECTOMY: SHX5026

## 2019-05-17 LAB — PROTIME-INR
INR: 1 (ref 0.8–1.2)
Prothrombin Time: 13.4 seconds (ref 11.4–15.2)

## 2019-05-17 SURGERY — LUMBAR LAMINECTOMY/DECOMPRESSION MICRODISCECTOMY 1 LEVEL
Anesthesia: General | Site: Spine Lumbar

## 2019-05-17 MED ORDER — FENTANYL CITRATE (PF) 250 MCG/5ML IJ SOLN
INTRAMUSCULAR | Status: AC
  Filled 2019-05-17: qty 5

## 2019-05-17 MED ORDER — THROMBIN 5000 UNITS EX SOLR
CUTANEOUS | Status: DC | PRN
  Administered 2019-05-17 (×2): 5000 [IU] via TOPICAL

## 2019-05-17 MED ORDER — PROPOFOL 10 MG/ML IV BOLUS
INTRAVENOUS | Status: AC
  Filled 2019-05-17: qty 20

## 2019-05-17 MED ORDER — HYDROCODONE-ACETAMINOPHEN 5-325 MG PO TABS: 0.5000 | ORAL_TABLET | ORAL | 0 refills | Status: DC | PRN

## 2019-05-17 MED ORDER — CYCLOBENZAPRINE HCL 5 MG PO TABS: 5.0000 mg | ORAL_TABLET | Freq: Three times a day (TID) | ORAL | Status: DC | PRN

## 2019-05-17 MED ORDER — LIDOCAINE 2% (20 MG/ML) 5 ML SYRINGE
INTRAMUSCULAR | Status: DC | PRN
  Administered 2019-05-17: 60 mg via INTRAVENOUS

## 2019-05-17 MED ORDER — ACETAMINOPHEN 10 MG/ML IV SOLN
INTRAVENOUS | Status: AC
  Filled 2019-05-17: qty 100

## 2019-05-17 MED ORDER — KCL IN DEXTROSE-NACL 20-5-0.45 MEQ/L-%-% IV SOLN: INTRAVENOUS | Status: DC

## 2019-05-17 MED ORDER — THROMBIN 5000 UNITS EX SOLR
CUTANEOUS | Status: AC
  Filled 2019-05-17: qty 5000

## 2019-05-17 MED ORDER — OXYCODONE HCL 5 MG PO TABS
ORAL_TABLET | ORAL | Status: AC
  Filled 2019-05-17: qty 1

## 2019-05-17 MED ORDER — ONDANSETRON HCL 4 MG/2ML IJ SOLN: 4.0000 mg | Freq: Four times a day (QID) | INTRAMUSCULAR | Status: DC | PRN

## 2019-05-17 MED ORDER — ACETAMINOPHEN 650 MG RE SUPP: 650.0000 mg | RECTAL | Status: DC | PRN

## 2019-05-17 MED ORDER — MAGNESIUM HYDROXIDE 400 MG/5ML PO SUSP: 30.0000 mL | Freq: Every day | ORAL | Status: DC | PRN

## 2019-05-17 MED ORDER — SODIUM CHLORIDE 0.9 % IV SOLN
INTRAVENOUS | Status: DC | PRN
  Administered 2019-05-17: 08:00:00 15 ug/min via INTRAVENOUS

## 2019-05-17 MED ORDER — KETOROLAC TROMETHAMINE 30 MG/ML IJ SOLN
INTRAMUSCULAR | Status: AC
  Filled 2019-05-17: qty 1

## 2019-05-17 MED ORDER — FLEET ENEMA 7-19 GM/118ML RE ENEM: 1.0000 | ENEMA | Freq: Once | RECTAL | Status: DC | PRN

## 2019-05-17 MED ORDER — METHYLPREDNISOLONE ACETATE 80 MG/ML IJ SUSP
INTRAMUSCULAR | Status: AC
  Filled 2019-05-17: qty 1

## 2019-05-17 MED ORDER — SODIUM CHLORIDE 0.9% FLUSH: 3.0000 mL | Freq: Two times a day (BID) | INTRAVENOUS | Status: DC

## 2019-05-17 MED ORDER — BUPIVACAINE HCL (PF) 0.5 % IJ SOLN
INTRAMUSCULAR | Status: DC | PRN
  Administered 2019-05-17: 10 mL

## 2019-05-17 MED ORDER — HEMOSTATIC AGENTS (NO CHARGE) OPTIME
TOPICAL | Status: DC | PRN
  Administered 2019-05-17: 1 via TOPICAL

## 2019-05-17 MED ORDER — ONDANSETRON HCL 4 MG/2ML IJ SOLN
INTRAMUSCULAR | Status: DC | PRN
  Administered 2019-05-17: 4 mg via INTRAVENOUS

## 2019-05-17 MED ORDER — DEXAMETHASONE SODIUM PHOSPHATE 10 MG/ML IJ SOLN
INTRAMUSCULAR | Status: DC | PRN
  Administered 2019-05-17: 10 mg via INTRAVENOUS

## 2019-05-17 MED ORDER — MORPHINE SULFATE (PF) 4 MG/ML IV SOLN: 4.0000 mg | INTRAVENOUS | Status: DC | PRN

## 2019-05-17 MED ORDER — ACETAMINOPHEN 10 MG/ML IV SOLN
INTRAVENOUS | Status: DC | PRN
  Administered 2019-05-17: 1000 mg via INTRAVENOUS

## 2019-05-17 MED ORDER — KETOROLAC TROMETHAMINE 30 MG/ML IJ SOLN
15.0000 mg | Freq: Once | INTRAMUSCULAR | Status: AC
  Administered 2019-05-17: 10:00:00 15 mg via INTRAVENOUS

## 2019-05-17 MED ORDER — ONDANSETRON HCL 4 MG PO TABS: 4.0000 mg | ORAL_TABLET | Freq: Four times a day (QID) | ORAL | Status: DC | PRN

## 2019-05-17 MED ORDER — HYDROCHLOROTHIAZIDE 12.5 MG PO CAPS
12.5000 mg | ORAL_CAPSULE | Freq: Every day | ORAL | Status: DC
  Filled 2019-05-17: qty 1

## 2019-05-17 MED ORDER — AMIODARONE HCL 100 MG PO TABS
100.0000 mg | ORAL_TABLET | ORAL | Status: DC
  Filled 2019-05-17: qty 1

## 2019-05-17 MED ORDER — GENTAMICIN IN SALINE 1.6-0.9 MG/ML-% IV SOLN
80.0000 mg | Freq: Once | INTRAVENOUS | Status: AC
  Administered 2019-05-17: 80 mg via INTRAVENOUS
  Filled 2019-05-17: qty 50

## 2019-05-17 MED ORDER — KETOROLAC TROMETHAMINE 30 MG/ML IJ SOLN: 15.0000 mg | Freq: Four times a day (QID) | INTRAMUSCULAR | Status: DC

## 2019-05-17 MED ORDER — HYDROCODONE-ACETAMINOPHEN 5-325 MG PO TABS: 1.0000 | ORAL_TABLET | ORAL | Status: DC | PRN

## 2019-05-17 MED ORDER — LACTATED RINGERS IV SOLN
INTRAVENOUS | Status: DC | PRN
  Administered 2019-05-17: 07:00:00 via INTRAVENOUS

## 2019-05-17 MED ORDER — CHLORHEXIDINE GLUCONATE CLOTH 2 % EX PADS: 6.0000 | MEDICATED_PAD | Freq: Once | CUTANEOUS | Status: DC

## 2019-05-17 MED ORDER — SODIUM CHLORIDE 0.9% FLUSH: 3.0000 mL | INTRAVENOUS | Status: DC | PRN

## 2019-05-17 MED ORDER — LOSARTAN POTASSIUM 50 MG PO TABS
100.0000 mg | ORAL_TABLET | Freq: Every day | ORAL | Status: DC
  Filled 2019-05-17: qty 2

## 2019-05-17 MED ORDER — OXYCODONE HCL 5 MG/5ML PO SOLN: 5.0000 mg | Freq: Once | ORAL | Status: AC | PRN

## 2019-05-17 MED ORDER — ALUM & MAG HYDROXIDE-SIMETH 200-200-20 MG/5ML PO SUSP: 30.0000 mL | Freq: Four times a day (QID) | ORAL | Status: DC | PRN

## 2019-05-17 MED ORDER — OXYCODONE HCL 5 MG PO TABS
5.0000 mg | ORAL_TABLET | Freq: Once | ORAL | Status: AC | PRN
  Administered 2019-05-17: 5 mg via ORAL

## 2019-05-17 MED ORDER — MIDAZOLAM HCL 2 MG/2ML IJ SOLN
INTRAMUSCULAR | Status: DC | PRN
  Administered 2019-05-17: 1 mg via INTRAVENOUS

## 2019-05-17 MED ORDER — FENTANYL CITRATE (PF) 100 MCG/2ML IJ SOLN
INTRAMUSCULAR | Status: AC
  Filled 2019-05-17: qty 2

## 2019-05-17 MED ORDER — PROPOFOL 10 MG/ML IV BOLUS
INTRAVENOUS | Status: DC | PRN
  Administered 2019-05-17: 140 mg via INTRAVENOUS

## 2019-05-17 MED ORDER — SODIUM CHLORIDE 0.9 % IV SOLN: 250.0000 mL | INTRAVENOUS | Status: DC

## 2019-05-17 MED ORDER — BUPIVACAINE HCL (PF) 0.5 % IJ SOLN
INTRAMUSCULAR | Status: AC
  Filled 2019-05-17: qty 30

## 2019-05-17 MED ORDER — MENTHOL 3 MG MT LOZG: 1.0000 | LOZENGE | OROMUCOSAL | Status: DC | PRN

## 2019-05-17 MED ORDER — THROMBIN 5000 UNITS EX SOLR
OROMUCOSAL | Status: DC | PRN
  Administered 2019-05-17: 5 mL via TOPICAL

## 2019-05-17 MED ORDER — HYDROXYZINE HCL 50 MG/ML IM SOLN: 50.0000 mg | INTRAMUSCULAR | Status: DC | PRN

## 2019-05-17 MED ORDER — SUGAMMADEX SODIUM 200 MG/2ML IV SOLN
INTRAVENOUS | Status: DC | PRN
  Administered 2019-05-17: 200 mg via INTRAVENOUS

## 2019-05-17 MED ORDER — FENTANYL CITRATE (PF) 100 MCG/2ML IJ SOLN
25.0000 ug | INTRAMUSCULAR | Status: DC | PRN
  Administered 2019-05-17: 10:00:00 25 ug via INTRAVENOUS

## 2019-05-17 MED ORDER — THROMBIN 20000 UNITS EX SOLR
CUTANEOUS | Status: AC
  Filled 2019-05-17: qty 20000

## 2019-05-17 MED ORDER — THROMBIN 5000 UNITS EX SOLR
CUTANEOUS | Status: AC
  Filled 2019-05-17: qty 10000

## 2019-05-17 MED ORDER — LIDOCAINE-EPINEPHRINE 1 %-1:100000 IJ SOLN
INTRAMUSCULAR | Status: AC
  Filled 2019-05-17: qty 1

## 2019-05-17 MED ORDER — SODIUM CHLORIDE 0.9 % IV SOLN
INTRAVENOUS | Status: DC | PRN
  Administered 2019-05-17: 500 mL

## 2019-05-17 MED ORDER — FENTANYL CITRATE (PF) 250 MCG/5ML IJ SOLN
INTRAMUSCULAR | Status: DC | PRN
  Administered 2019-05-17 (×2): 50 ug via INTRAVENOUS
  Administered 2019-05-17: 25 ug via INTRAVENOUS
  Administered 2019-05-17: 50 ug via INTRAVENOUS
  Administered 2019-05-17: 25 ug via INTRAVENOUS

## 2019-05-17 MED ORDER — MIDAZOLAM HCL 2 MG/2ML IJ SOLN
INTRAMUSCULAR | Status: AC
  Filled 2019-05-17: qty 2

## 2019-05-17 MED ORDER — PHENOL 1.4 % MT LIQD: 1.0000 | OROMUCOSAL | Status: DC | PRN

## 2019-05-17 MED ORDER — EPHEDRINE SULFATE 50 MG/ML IJ SOLN
INTRAMUSCULAR | Status: DC | PRN
  Administered 2019-05-17 (×2): 5 mg via INTRAVENOUS

## 2019-05-17 MED ORDER — ACETAMINOPHEN 325 MG PO TABS: 650.0000 mg | ORAL_TABLET | ORAL | Status: DC | PRN

## 2019-05-17 MED ORDER — LIDOCAINE-EPINEPHRINE 1 %-1:100000 IJ SOLN
INTRAMUSCULAR | Status: DC | PRN
  Administered 2019-05-17: 10 mL

## 2019-05-17 MED ORDER — LOSARTAN POTASSIUM-HCTZ 100-12.5 MG PO TABS: 1.0000 | ORAL_TABLET | Freq: Every day | ORAL | Status: DC

## 2019-05-17 MED ORDER — HYDROXYZINE HCL 25 MG PO TABS: 50.0000 mg | ORAL_TABLET | ORAL | Status: DC | PRN

## 2019-05-17 MED ORDER — ROCURONIUM BROMIDE 10 MG/ML (PF) SYRINGE
PREFILLED_SYRINGE | INTRAVENOUS | Status: DC | PRN
  Administered 2019-05-17: 50 mg via INTRAVENOUS
  Administered 2019-05-17: 10 mg via INTRAVENOUS

## 2019-05-17 MED ORDER — BISACODYL 10 MG RE SUPP: 10.0000 mg | Freq: Every day | RECTAL | Status: DC | PRN

## 2019-05-17 MED ORDER — 0.9 % SODIUM CHLORIDE (POUR BTL) OPTIME
TOPICAL | Status: DC | PRN
  Administered 2019-05-17: 1000 mL

## 2019-05-17 SURGICAL SUPPLY — 56 items
ADH SKN CLS APL DERMABOND .7 (GAUZE/BANDAGES/DRESSINGS) ×1
BAG DECANTER FOR FLEXI CONT (MISCELLANEOUS) ×2 IMPLANT
BLADE CLIPPER SURG (BLADE) IMPLANT
BUR ACRON 5.0MM COATED (BURR) ×1 IMPLANT
BUR MATCHSTICK NEURO 3.0 LAGG (BURR) ×2 IMPLANT
CANISTER SUCT 3000ML PPV (MISCELLANEOUS) ×2 IMPLANT
CARTRIDGE OIL MAESTRO DRILL (MISCELLANEOUS) ×1 IMPLANT
DERMABOND ADVANCED (GAUZE/BANDAGES/DRESSINGS) ×1
DERMABOND ADVANCED .7 DNX12 (GAUZE/BANDAGES/DRESSINGS) ×1 IMPLANT
DIFFUSER DRILL AIR PNEUMATIC (MISCELLANEOUS) ×2 IMPLANT
DRAPE LAPAROTOMY 100X72X124 (DRAPES) ×2 IMPLANT
DRAPE MICROSCOPE LEICA (MISCELLANEOUS) ×2 IMPLANT
DRAPE POUCH INSTRU U-SHP 10X18 (DRAPES) ×2 IMPLANT
ELECT REM PT RETURN 9FT ADLT (ELECTROSURGICAL) ×2
ELECTRODE REM PT RTRN 9FT ADLT (ELECTROSURGICAL) ×1 IMPLANT
GAUZE 4X4 16PLY RFD (DISPOSABLE) IMPLANT
GAUZE SPONGE 4X4 12PLY STRL (GAUZE/BANDAGES/DRESSINGS) IMPLANT
GLOVE BIO SURGEON STRL SZ8 (GLOVE) ×1 IMPLANT
GLOVE BIO SURGEON STRL SZ8.5 (GLOVE) ×1 IMPLANT
GLOVE BIOGEL PI IND STRL 7.0 (GLOVE) IMPLANT
GLOVE BIOGEL PI IND STRL 7.5 (GLOVE) IMPLANT
GLOVE BIOGEL PI IND STRL 8 (GLOVE) ×1 IMPLANT
GLOVE BIOGEL PI INDICATOR 7.0 (GLOVE) ×3
GLOVE BIOGEL PI INDICATOR 7.5 (GLOVE) ×2
GLOVE BIOGEL PI INDICATOR 8 (GLOVE) ×1
GLOVE ECLIPSE 7.5 STRL STRAW (GLOVE) ×2 IMPLANT
GLOVE SS N UNI LF 6.5 STRL (GLOVE) ×4 IMPLANT
GOWN STRL REUS W/ TWL LRG LVL3 (GOWN DISPOSABLE) IMPLANT
GOWN STRL REUS W/ TWL XL LVL3 (GOWN DISPOSABLE) ×1 IMPLANT
GOWN STRL REUS W/TWL 2XL LVL3 (GOWN DISPOSABLE) IMPLANT
GOWN STRL REUS W/TWL LRG LVL3 (GOWN DISPOSABLE) ×4
GOWN STRL REUS W/TWL XL LVL3 (GOWN DISPOSABLE) ×4
HEMOSTAT POWDER KIT SURGIFOAM (HEMOSTASIS) ×1 IMPLANT
KIT BASIN OR (CUSTOM PROCEDURE TRAY) ×2 IMPLANT
KIT TURNOVER KIT B (KITS) ×2 IMPLANT
NDL SPNL 18GX3.5 QUINCKE PK (NEEDLE) ×1 IMPLANT
NDL SPNL 22GX3.5 QUINCKE BK (NEEDLE) ×1 IMPLANT
NEEDLE SPNL 18GX3.5 QUINCKE PK (NEEDLE) ×2 IMPLANT
NEEDLE SPNL 22GX3.5 QUINCKE BK (NEEDLE) ×4 IMPLANT
NS IRRIG 1000ML POUR BTL (IV SOLUTION) ×2 IMPLANT
OIL CARTRIDGE MAESTRO DRILL (MISCELLANEOUS) ×2
PACK LAMINECTOMY NEURO (CUSTOM PROCEDURE TRAY) ×2 IMPLANT
PAD ARMBOARD 7.5X6 YLW CONV (MISCELLANEOUS) ×6 IMPLANT
PATTIES SURGICAL .5 X1 (DISPOSABLE) IMPLANT
RUBBERBAND STERILE (MISCELLANEOUS) ×4 IMPLANT
SPONGE LAP 4X18 RFD (DISPOSABLE) IMPLANT
SPONGE SURGIFOAM ABS GEL SZ50 (HEMOSTASIS) ×2 IMPLANT
SUT PROLENE 6 0 BV (SUTURE) IMPLANT
SUT VIC AB 1 CT1 18XBRD ANBCTR (SUTURE) ×1 IMPLANT
SUT VIC AB 1 CT1 8-18 (SUTURE) ×4
SUT VIC AB 2-0 CP2 18 (SUTURE) ×3 IMPLANT
SUT VIC AB 3-0 SH 8-18 (SUTURE) IMPLANT
SYR 5ML LL (SYRINGE) IMPLANT
TOWEL GREEN STERILE (TOWEL DISPOSABLE) ×2 IMPLANT
TOWEL GREEN STERILE FF (TOWEL DISPOSABLE) ×2 IMPLANT
WATER STERILE IRR 1000ML POUR (IV SOLUTION) ×2 IMPLANT

## 2019-05-17 NOTE — Transfer of Care (Signed)
Immediate Anesthesia Transfer of Care Note  Patient: NEVAEH CASILLAS  Procedure(s) Performed: Lumbar Four Lumbar Five Lumbar Laminectomy (N/A Spine Lumbar)  Patient Location: PACU  Anesthesia Type:General  Level of Consciousness: awake, alert  and oriented  Airway & Oxygen Therapy: Patient Spontanous Breathing  Post-op Assessment: Report given to RN and Post -op Vital signs reviewed and stable  Post vital signs: Reviewed and stable  Last Vitals:  Vitals Value Taken Time  BP 127/71 05/17/19 0951  Temp    Pulse 61 05/17/19 0951  Resp 14 05/17/19 0951  SpO2 99 % 05/17/19 0951  Vitals shown include unvalidated device data.  Last Pain:  Vitals:   05/17/19 0612  TempSrc:   PainSc: 0-No pain      Patients Stated Pain Goal: 2 (44/51/46 0479)  Complications: No apparent anesthesia complications

## 2019-05-17 NOTE — Anesthesia Preprocedure Evaluation (Signed)
Anesthesia Evaluation  Patient identified by MRN, date of birth, ID band Patient awake    Reviewed: Allergy & Precautions, H&P , NPO status , Patient's Chart, lab work & pertinent test results  Airway Mallampati: II   Neck ROM: full    Dental   Pulmonary former smoker,    breath sounds clear to auscultation       Cardiovascular hypertension, + dysrhythmias Atrial Fibrillation  Rhythm:irregular Rate:Normal     Neuro/Psych    GI/Hepatic   Endo/Other    Renal/GU      Musculoskeletal  (+) Arthritis ,   Abdominal   Peds  Hematology   Anesthesia Other Findings   Reproductive/Obstetrics                             Anesthesia Physical Anesthesia Plan  ASA: III  Anesthesia Plan: General   Post-op Pain Management:    Induction: Intravenous  PONV Risk Score and Plan: 2 and Ondansetron, Dexamethasone and Treatment may vary due to age or medical condition  Airway Management Planned: Oral ETT  Additional Equipment:   Intra-op Plan:   Post-operative Plan: Extubation in OR  Informed Consent: I have reviewed the patients History and Physical, chart, labs and discussed the procedure including the risks, benefits and alternatives for the proposed anesthesia with the patient or authorized representative who has indicated his/her understanding and acceptance.       Plan Discussed with: CRNA, Anesthesiologist and Surgeon  Anesthesia Plan Comments:         Anesthesia Quick Evaluation

## 2019-05-17 NOTE — Plan of Care (Signed)
Pt doing well. Pt and wife given D/C instructions with verbal understanding. Rx's were sent to pharmacy by MD. Pt's incision is clean and dry with no sign of infection. Pt's IV was removed prior to D/C. Pt D/C'd home via wheelchair @ 1805 per MD order. Pt is stable @ D/C and has no other needs at this time. Holli Humbles, RN

## 2019-05-17 NOTE — Discharge Summary (Signed)
Physician Discharge Summary  Patient ID: Lance Hubbard MRN: [id number] DOB/AGE: [birth year] 78 y.o.  Admit date: 05/17/2019 Discharge date: 05/17/2019  Admission Diagnoses: Lumbar stenosis  Discharge Diagnoses: Lumbar stenosis Active Problems:   Lumbar stenosis with neurogenic claudication   Discharged Condition: good  Hospital Course: Patient was admitted, underwent a L4 and L5 decompressive lumbar laminectomy.  He has done well following surgery, and is up and ambulating in the halls.  His incision is healing nicely.  He is voiding well.  He is asking to be discharged to home.  He and his wife have been given instructions regarding wound care and activities.  He is to follow-up with me in the office in 3 weeks.  He and his wife have been instructed that he should resume his Eliquis in 1 week if he is doing well.  Discharge Exam: Blood pressure 128/74, pulse (!) 56, temperature (!) 97 F (36.1 C), resp. rate 16, height 6\' 2"  (1.88 m), weight 97.5 kg, SpO2 100 %.  Disposition: Discharge disposition: 01-Home or Self Care       Discharge Instructions    Discharge wound care:   Complete by: As directed    Leave the wound open to air. Shower daily with the wound uncovered. Water and soapy water should run over the incision area. Do not wash directly on the incision for 2 weeks. Remove the glue after 2 weeks.   Driving Restrictions   Complete by: As directed    No driving for 2 weeks. May ride in the car locally now. May begin to drive locally in 2 weeks.   Other Restrictions   Complete by: As directed    Walk gradually increasing distances out in the fresh air at least twice a day. Walking additional 6 times inside the house, gradually increasing distances, daily. No bending, lifting, or twisting. Perform activities between shoulder and waist height (that is at counter height when standing or table height when sitting).     Allergies as of 05/17/2019      Reactions   Amoxicillin  Other (See Comments)   REACTION: whelps/pain in knees and ankles      Medication List    TAKE these medications   acetaminophen 500 MG tablet Commonly known as: TYLENOL Take 1,500 mg by mouth 2 (two) times a day.   amiodarone 200 MG tablet Commonly known as: PACERONE Take 1 tablet (200 mg total) by mouth 2 (two) times daily. What changed:   how much to take  when to take this   apixaban 5 MG Tabs tablet Commonly known as: ELIQUIS Take 1 tablet (5 mg total) by mouth every 12 (twelve) hours.   clobetasol 0.05 % external solution Commonly known as: TEMOVATE Apply 1 application topically 2 (two) times daily as needed (for bug bites).   HYDROcodone-acetaminophen 5-325 MG tablet Commonly known as: NORCO/VICODIN Take 0.5-1 tablets by mouth every 4 (four) hours as needed (pain).   losartan-hydrochlorothiazide 100-12.5 MG tablet Commonly known as: HYZAAR Take 1 tablet by mouth daily.   Systane Ultra 0.4-0.3 % Soln Generic drug: Polyethyl Glycol-Propyl Glycol Place 1-2 drops into both eyes 3 (three) times daily as needed (for dry eyes).            Discharge Care Instructions  (From admission, onward)         Start     Ordered   05/17/19 0000  Discharge wound care:    Comments: Leave the wound open to air. Shower daily with the  wound uncovered. Water and soapy water should run over the incision area. Do not wash directly on the incision for 2 weeks. Remove the glue after 2 weeks.   05/17/19 1826           Signed: Hosie Spangle 05/17/2019, 6:26 PM

## 2019-05-17 NOTE — H&P (Signed)
Subjective: Patient is a 78 y.o. right-handed male who is admitted for treatment of multifactorial lumbar stenosis with neurogenic claudication and weakness in the distal lower extremities bilaterally.  Patient has been having difficulties for the past 2 months with pain in the low back rating down to the buttocks and posterior thighs bilaterally, and at times into the calves.  He is comfortable when sitting, but has disabling pain with standing and walking.  His gait has become shuffling and he has had a number of falls.  On examination he was found to have weakness in the distal lower extremities.  MRI scan shows marked to severe multifactorial lumbar stenosis at the L4-5 level.  Patient is admitted now for an L4 and L5 decompressive lumbar laminectomy.  Patient Active Problem List   Diagnosis Date Noted  . Leukocytosis 05/08/2019  . HTN (hypertension) 12/16/2013  . Atrial fibrillation with RVR ([place]) 12/16/2013   Past Medical History:  Diagnosis Date  . Arthritis   . Dysrhythmia    Atrial Fibrillation  . History of kidney stones   . Hypertension     Past Surgical History:  Procedure Laterality Date  . CARDIOVERSION N/A 12/18/2013   Procedure: CARDIOVERSION;  Surgeon: Birdie Riddle, MD;  Location: [facility] ENDOSCOPY;  Service: Cardiovascular;  Laterality: N/A;  9:58 Lido 40 mg, IV Propofol 40 mg, IV for anesthesia,  synched cardioversio @ 120 joules with successful NSR   . COLONOSCOPY    . KNEE ARTHROSCOPY    . knee dsurgery    . LEFT HEART CATHETERIZATION WITH CORONARY ANGIOGRAM N/A 12/19/2013   Procedure: LEFT HEART CATHETERIZATION WITH CORONARY ANGIOGRAM;  Surgeon: Clent Demark, MD;  Location: [facility] CATH LAB;  Service: Cardiovascular;  Laterality: N/A;  . TEE WITHOUT CARDIOVERSION N/A 12/18/2013   Procedure: TRANSESOPHAGEAL ECHOCARDIOGRAM (TEE);  Surgeon: Birdie Riddle, MD;  Location: [facility] ENDOSCOPY;  Service: Cardiovascular;  Laterality: N/A;    Medications Prior to Admission  Medication  Sig Dispense Refill Last Dose  . acetaminophen (TYLENOL) 500 MG tablet Take 1,500 mg by mouth 2 (two) times a day.   05/16/2019 at Unknown time  . amiodarone (PACERONE) 200 MG tablet Take 1 tablet (200 mg total) by mouth 2 (two) times daily. (Patient taking differently: Take 100 mg by mouth every Monday, Wednesday, and Friday. ) 30 tablet 3 Past Week at Unknown time  . clobetasol (TEMOVATE) 0.05 % external solution Apply 1 application topically 2 (two) times daily as needed (for bug bites).    Past Week at Unknown time  . losartan-hydrochlorothiazide (HYZAAR) 100-12.5 MG tablet Take 1 tablet by mouth daily.    05/16/2019 at Unknown time  . Polyethyl Glycol-Propyl Glycol (SYSTANE ULTRA) 0.4-0.3 % SOLN Place 1-2 drops into both eyes 3 (three) times daily as needed (for dry eyes).   05/16/2019 at Unknown time  . apixaban (ELIQUIS) 5 MG TABS tablet Take 1 tablet (5 mg total) by mouth every 12 (twelve) hours. 60 tablet 3 05/11/2019   Allergies  Allergen Reactions  . Amoxicillin Other (See Comments)    REACTION: whelps/pain in knees and ankles    Social History   Tobacco Use  . Smoking status: Former Smoker    Types: Pipe    Quit date: 11/08/2018    Years since quitting: 0.5  . Smokeless tobacco: Never Used  Substance Use Topics  . Alcohol use: Not Currently    Comment: stopped around 2012    Family History  Problem Relation Age of Onset  . Heart  disease Father   . Hypertension Sister      Review of Systems Pertinent items noted in HPI and remainder of comprehensive ROS otherwise negative.  Objective: Vital signs in last 24 hours: Temp:  [97.9 F (36.6 C)] 97.9 F (36.6 C) (07/22 0550) Pulse Rate:  [63] 63 (07/22 0550) Resp:  [20] 20 (07/22 0550) BP: (131)/(82) 131/82 (07/22 0550) SpO2:  [100 %] 100 % (07/22 0550) Weight:  [97.5 kg] 97.5 kg (07/22 0550)  EXAM: Patient is a well-developed well-nourished white male in no acute distress.   Lungs are clear to auscultation , the patient  has symmetrical respiratory excursion. Heart has a regular rate and rhythm normal S1 and S2 no murmur.   Abdomen is soft nontender nondistended bowel sounds are present. Extremity examination shows no clubbing cyanosis or edema. Motor examination shows the iliopsoas and quadriceps are 5 bilaterally.  Left dorsiflexor is 5, right dorsiflexors 4.  EHL is 4- bilaterally.  Plantar flexors 5 bilaterally.  Sensation is decreased to pinprick in the feet bilaterally.  Reflexes are 1 at the quadriceps, absent at the gastrocnemius, they are symmetrical bilaterally.  Toes are downgoing belly.  His gait and stance are mildly unsteady.  Data Review:CBC    Component Value Date/Time   WBC 24.6 (H) 05/09/2019 0837   WBC 25.6 (H) 05/01/2019 1100   RBC 4.83 05/09/2019 0837   HGB 14.8 05/09/2019 0837   HCT 44.7 05/09/2019 0837   PLT 210 05/09/2019 0837   MCV 92.5 05/09/2019 0837   MCH 30.6 05/09/2019 0837   MCHC 33.1 05/09/2019 0837   RDW 14.2 05/09/2019 0837   LYMPHSABS 4.8 (H) 05/09/2019 0837   MONOABS 1.6 (H) 05/09/2019 0837   EOSABS 0.5 05/09/2019 0837   BASOSABS 0.6 (H) 05/09/2019 0837                          BMET    Component Value Date/Time   NA 139 05/09/2019 0837   K 3.6 05/09/2019 0837   CL 101 05/09/2019 0837   CO2 29 05/09/2019 0837   GLUCOSE 106 (H) 05/09/2019 0837   BUN 17 05/09/2019 0837   CREATININE 0.92 05/09/2019 0837   CALCIUM 8.6 (L) 05/09/2019 0837   GFRNONAA >60 05/09/2019 0837   GFRAA >60 05/09/2019 7782     Assessment/Plan: Patient with disabling neurogenic claudication secondary to marked to severe multifactorial L4-5 lumbar stenosis who is admitted now for decompressive lumbar laminectomy.  I've discussed with the patient the nature of his condition, the nature the surgical procedure, the typical length of surgery, hospital stay, and overall recuperation. We discussed limitations postoperatively. I discussed risks of surgery including risks of infection, bleeding,  possibly need for transfusion, the risk of nerve root dysfunction with pain, weakness, numbness, or paresthesias, or risk of dural tear and CSF leakage and possible need for further surgery, and the risk of anesthetic complications including myocardial infarction, stroke, pneumonia, and death. Understanding all this the patient does wish to proceed with surgery and is admitted for such.  Hosie Spangle, MD 05/17/2019 7:11 AM

## 2019-05-17 NOTE — Op Note (Signed)
05/17/2019  9:46 AM  PATIENT:  Lance Hubbard  78 y.o. male  PRE-OPERATIVE DIAGNOSIS:  Lumbar stenosis with neurogenic claudication  POST-OPERATIVE DIAGNOSIS:  Lumbar stenosis with neurogenic claudication  PROCEDURE:  Procedure(s): L4 and L5 decompressive lumbar laminectomy, with medial facetectomy, and foraminotomies for decompression of the stenotic compression of the central canal, lateral recesses, and exiting L4 and L5 nerve roots bilaterally, with microdissection, microsurgical technique, and the operating microscope  SURGEON: Jovita Gamma, MD  ASSISTANTS: Newman Pies, MD  ANESTHESIA:   general  EBL:  Total I/O In: 800 [I.V.:800] Out: 50 [Blood:50]  BLOOD ADMINISTERED:none  COUNT: Correct per nursing staff  DICTATION: Patient was brought to the operating room placed under general endotracheal anesthesia. Patient was turned to a prone position the lumbar region was prepped with Betadine soap and solution and draped in a sterile fashion. The midline was infiltrated with local anesthetic with epinephrine. A localizing x-ray was taken and then a midline incision was made carried down thru the subcutaneous tissue, bipolar cautery and electrocautery were used to maintain hemostasis. Dissection was carried down to the lumbar fascia which was incised bilaterally and the paraspinal muscles were dissected from the spinous process and lamina in a subperiosteal fashion. Another localizing x-ray was taken and the L4 and L5 levels were identified. Laminectomy was begun with double-action rongeurs a high-speed drill and Kerrison punches. The thickened ligamentum flavum was carefully removed.  We did find a synovial cyst embedded within the ligamentum flavum on the left side, and this was removed as well.  Dissection was carried laterally, performing medial facetectomies, to decompress the lateral stenosis taking care to leave the facet complexes intact.  Thickened ligamentum flavum and  spondylitic overgrowth was carefully removed from the neuroforamen for the exiting L4 and L5 nerve roots bilaterally, so as to decompress these nerve roots within the exiting neural foramina.  Once the decompression was completed hemostasis was established with the use of bipolar cautery and Gelfoam with thrombin.  The Gelfoam was removed, and a thin layer of Surgifoam applied.  Hemostasis was established and confirmed.  We then proceeded with closure.  Paraspinal muscles, deep fascia, and Scarpa's fascia were closed in separate layers with interrupted 1 undyed Vicryl sutures. The subcutaneous and subcuticular were closed with interrupted inverted 2-0 undyed Vicryl sutures. Skin edges were approximated with Dermabond.  Following surgery the patient is to be turned back to supine position, reversed in the anesthetic, extubated, and transferred to the recovery room for further care.  PLAN OF CARE: Admit for overnight observation  PATIENT DISPOSITION:  PACU - hemodynamically stable.   Delay start of Pharmacological VTE agent (>24hrs) due to surgical blood loss or risk of bleeding:  yes

## 2019-05-17 NOTE — Anesthesia Procedure Notes (Signed)
Procedure Name: Intubation Date/Time: 05/17/2019 7:35 AM Performed by: Clearnce Sorrel, CRNA Pre-anesthesia Checklist: Patient identified, Emergency Drugs available, Suction available, Patient being monitored and Timeout performed Patient Re-evaluated:Patient Re-evaluated prior to induction Oxygen Delivery Method: Circle system utilized Preoxygenation: Pre-oxygenation with 100% oxygen Induction Type: IV induction Ventilation: Mask ventilation without difficulty, Oral airway inserted - appropriate to patient size and Two handed mask ventilation required Laryngoscope Size: Mac and 4 Grade View: Grade I Tube type: Oral Tube size: 7.5 mm Number of attempts: 1 Airway Equipment and Method: Stylet Placement Confirmation: ETT inserted through vocal cords under direct vision,  positive ETCO2 and breath sounds checked- equal and bilateral Secured at: 23 cm Tube secured with: Tape Dental Injury: Teeth and Oropharynx as per pre-operative assessment

## 2019-05-17 NOTE — Discharge Instructions (Signed)

## 2019-05-18 ENCOUNTER — Encounter (HOSPITAL_COMMUNITY): Payer: Self-pay | Admitting: Neurosurgery

## 2019-05-18 NOTE — Anesthesia Postprocedure Evaluation (Signed)
Anesthesia Post Note  Patient: Lance Hubbard  Procedure(s) Performed: Lumbar Four Lumbar Five Lumbar Laminectomy (N/A Spine Lumbar)     Patient location during evaluation: PACU Anesthesia Type: General Level of consciousness: awake and alert Pain management: pain level controlled Vital Signs Assessment: post-procedure vital signs reviewed and stable Respiratory status: spontaneous breathing, nonlabored ventilation, respiratory function stable and patient connected to nasal cannula oxygen Cardiovascular status: blood pressure returned to baseline and stable Postop Assessment: no apparent nausea or vomiting Anesthetic complications: no    Last Vitals:  Vitals:   05/17/19 1020 05/17/19 1053  BP: 118/66 128/74  Pulse: 63 (!) 56  Resp: 16 16  Temp:  (!) 36.1 C  SpO2: 94% 100%    Last Pain:  Vitals:   05/17/19 1600  TempSrc:   PainSc: 2                  Acelin Ferdig S

## 2019-05-21 ENCOUNTER — Encounter (HOSPITAL_COMMUNITY): Payer: Self-pay | Admitting: Emergency Medicine

## 2019-05-21 ENCOUNTER — Emergency Department (HOSPITAL_COMMUNITY)
Admission: EM | Admit: 2019-05-21 | Discharge: 2019-05-21 | Disposition: A | Discharge status: Home / Self Care | Payer: Medicare HMO | Attending: Emergency Medicine | Admitting: Emergency Medicine

## 2019-05-21 ENCOUNTER — Other Ambulatory Visit: Payer: Self-pay

## 2019-05-21 DIAGNOSIS — Z7901 Long term (current) use of anticoagulants: Secondary | ICD-10-CM | POA: Insufficient documentation

## 2019-05-21 DIAGNOSIS — I10 Essential (primary) hypertension: Secondary | ICD-10-CM | POA: Diagnosis not present

## 2019-05-21 DIAGNOSIS — K5903 Drug induced constipation: Secondary | ICD-10-CM | POA: Insufficient documentation

## 2019-05-21 DIAGNOSIS — Z87891 Personal history of nicotine dependence: Secondary | ICD-10-CM | POA: Diagnosis not present

## 2019-05-21 DIAGNOSIS — K5900 Constipation, unspecified: Secondary | ICD-10-CM | POA: Diagnosis present

## 2019-05-21 DIAGNOSIS — Z79899 Other long term (current) drug therapy: Secondary | ICD-10-CM | POA: Insufficient documentation

## 2019-05-21 NOTE — ED Provider Notes (Signed)
[facility] EMERGENCY DEPARTMENT Provider Note   CSN: [id number] Arrival date & time: 05/21/19  0309     History   Chief Complaint Chief Complaint  Patient presents with  . Constipation    HPI Lance Hubbard is a 78 y.o. male.     HPI  78 yo male s/p back surgery 4 days pte presents today with crampy abdominal pain began after taking percocet last night.  He reports a small bowel movement yesterday.  Pain is crampy and diffuse.  He did not have any nausea, vomiting, diarrhea, fever, or chills.  He has not any difficulty voiding.  He has been taking stool softener.  He feels that he is constipated.  The pain improved after 2 hours.  He is not having significant pain here in the ED currently.  He is on blood thinners but has been off of them for the surgery and has not restarted yet  Past Medical History:  Diagnosis Date  . Arthritis   . Dysrhythmia    Atrial Fibrillation  . History of kidney stones   . Hypertension     Patient Active Problem List   Diagnosis Date Noted  . 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 Leukocytosis 05/08/2019  . HTN (hypertension) 12/16/2013  . Atrial fibrillation with RVR ([place]) 12/16/2013    Past Surgical History:  Procedure Laterality Date  . CARDIOVERSION N/A 12/18/2013   Procedure: CARDIOVERSION;  Surgeon: Birdie Riddle, MD;  Location: [facility] ENDOSCOPY;  Service: Cardiovascular;  Laterality: N/A;  9:58 Lido 40 mg, IV Propofol 40 mg, IV for anesthesia,  synched cardioversio @ 120 joules with successful NSR   . COLONOSCOPY    . KNEE ARTHROSCOPY    . knee dsurgery    . LEFT HEART CATHETERIZATION WITH CORONARY ANGIOGRAM N/A 12/19/2013   Procedure: LEFT HEART CATHETERIZATION WITH CORONARY ANGIOGRAM;  Surgeon: Clent Demark, MD;  Location: [facility] CATH LAB;  Service: Cardiovascular;  Laterality: N/A;  . LUMBAR LAMINECTOMY/DECOMPRESSION MICRODISCECTOMY N/A 05/17/2019   Procedure: Lumbar Four Lumbar Five Lumbar  Laminectomy;  Surgeon: Jovita Gamma, MD;  Location: [place];  Service: Neurosurgery;  Laterality: N/A;  Lumbar 4 Lumbar 5 Lumbar Laminectomy  . TEE WITHOUT CARDIOVERSION N/A 12/18/2013   Procedure: TRANSESOPHAGEAL ECHOCARDIOGRAM (TEE);  Surgeon: Birdie Riddle, MD;  [facility] END[facility]COPY;  Service: Cardiovascular;  Laterality: N/A;        Home Medications    Prior to Admission medications   Medication Sig Start Date End Date Taking? Authorizing Provider  acetaminophen (TYLENOL) 500 MG tablet Take 1,500 mg by mouth 2 (two) times a day.    [provider]  amiodarone (PACERONE) 200 MG tablet Take 1 tablet (200 mg total) by mouth 2 (two) times daily. Patient taking differently: Take 100 mg by mouth every Monday, Wednesday, and Friday.  12/20/13   Charolette Forward, MD  apixaban (ELIQUIS) 5 MG TABS tablet Take 1 tablet (5 mg total) by mouth every 12 (twelve) hours. 12/20/13   Charolette Forward, MD  clobetasol (TEMOVATE) 0.05 % external solution Apply 1 application topically 2 (two) times daily as needed (for bug bites).     [provider]  HYDROcodone-acetaminophen (NORCO/VICODIN) 5-325 MG tablet Take 0.5-1 tablets by mouth every 4 (four) hours as needed (pain). 05/17/19   Jovita Gamma, MD  losartan-hydrochlorothiazide (HYZAAR) 100-12.5 MG tablet Take 1 tablet by mouth daily.  12/07/18   [provider]  Polyethyl Glycol-Propyl Glycol (SYSTANE ULTRA) 0.4-0.3 % SOLN Place 1-2  drops into both eyes 3 (three) times daily as needed (for dry eyes).    [provider]    Family History Family History  Problem Relation Age of Onset  . Heart disease Father   . Hypertension Sister     Social History Social History   Tobacco Use  . Smoking status: Former Smoker    Types: Pipe    Quit date: 11/08/2018    Years since quitting: 0.5  . Smokeless tobacco: Never Used  Substance Use Topics  . Alcohol use: Not Currently    Comment: stopped around 2012  . Drug use: No      Allergies   Amoxicillin   Review of Systems Review of Systems  All other systems reviewed and are negative.    Physical Exam Updated Vital Signs BP (!) 153/83   Pulse 73   Temp 99.9 F (37.7 C) (Oral)   Resp 16   Ht 1.88 m (6\' 2" )   Wt 97.5 kg   SpO2 98%   BMI 27.60 kg/m   Physical Exam Vitals signs and nursing note reviewed.  Constitutional:      General: He is not in acute distress.    Appearance: Normal appearance. He is not ill-appearing.  HENT:     Head: Normocephalic and atraumatic.     Right Ear: External ear normal.     Left Ear: External ear normal.     Nose: Nose normal.     Mouth/Throat:     Pharynx: Oropharynx is clear.  Eyes:     Pupils: Pupils are equal, round, and reactive to light.  Neck:     Musculoskeletal: Normal range of motion.  Cardiovascular:     Rate and Rhythm: Normal rate and regular rhythm.     Pulses: Normal pulses.  Pulmonary:     Effort: Pulmonary effort is normal.     Breath sounds: Normal breath sounds.  Abdominal:     General: Abdomen is flat. Bowel sounds are normal. There is no distension.     Palpations: Abdomen is soft.     Tenderness: There is no abdominal tenderness. There is no guarding or rebound.  Genitourinary:    Rectum: Normal.     Comments: Digital rectal exam with no stool noted Musculoskeletal: Normal range of motion.  Skin:    General: Skin is warm and dry.     Capillary Refill: Capillary refill takes less than 2 seconds.     Comments: Midline low back incision with no erythema or discharge  Neurological:     General: No focal deficit present.     Mental Status: He is alert and oriented to person, place, and time.      ED Treatments / Results  Labs (all labs ordered are listed, but only abnormal results are displayed) Labs Reviewed - No data to display  EKG None  Radiology No results found.  Procedures Procedures (including critical care time)  Medications Ordered in ED Medications  - No data to display   Initial Impression / Assessment and Plan / ED Course  I have reviewed the triage vital signs and the nursing notes.  Pertinent labs & imaging results that were available during my care of the patient were reviewed by me and considered in my medical decision making (see chart for details).        78 year old male with history consistent with constipation secondary to narcotic pain medicine.  He decided that he has stopped the pain medicine and took the  last one at 4 PM yesterday.  Currently he is not having pain his abdomen is soft and nontender.  He has had a bowel movement since surgery.  He is not having nausea or vomiting.  We have discussed good oral hydration, fiber intake, and movement as tolerated.  We have discussed return precautions and need for follow-up and he voices understanding  Final Clinical Impressions(s) / ED Diagnoses   Final diagnoses:  Drug-induced constipation    ED Discharge Orders    None       Pattricia Boss, MD 05/21/19 [phone]

## 2019-05-21 NOTE — ED Notes (Signed)
Pt verbalized understanding of discharge paperwork and follow-up care.  °

## 2019-05-21 NOTE — Discharge Instructions (Signed)
Stop narcotic pain medicine Use metamucil as per package instructions twice daily with plenty of fluids

## 2019-05-21 NOTE — ED Triage Notes (Signed)
Pt reports he feels constipated. Had back surgery on this past Wednesday, no complications noted. Pt states he did have a bowel movement yesterday morning however not since.  Constipation is causing pain.

## 2019-05-22 NOTE — Progress Notes (Signed)
[facility] OFFICE PROGRESS NOTE  Patient Care Team: Christain Sacramento, MD as PCP - General (Family Medicine)  HEME/ONC OVERVIEW: 1. 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   ASSESSMENT & PLAN:   Newly diagnosed CML -I reviewed the pathology results in detail with the patient, and discussed in detail the diagnosis, treatment options and prognosis of CML -I also reviewed with him in detail the NCCN guideline -Based on the BCR/ABL quant studies, this is consistent with CML -In the absence of significant blast or promyelocyte population, this is consistent with chronic phase CML -I recommend bone marrow biopsy to confirm chronic phase, which will determine his treatment options -I discussed with him about the rationale for bone marrow biopsy as well as some of the potential risks of the procedure, including bleeding, infection and injury to nearby organs; patient expressed understanding and agreed to the procedure  -I have also ordered hepatitis B/C serologies prior to starting TKI -In addition, given the patient's hx of non-ischemic cardiomyopathy and A-fib s/p cardioversion, I have ordered repeat TTE to assess cardiac function -In light of his history of extensive cardiovascular disease, his treatment options include imatinib and dasatinib; nilotinib less desirable due to the cardiovascular side effects   Atrial fibrillation -S/p cardioversion in 2015; no recent echocardiogram -Given his extensive cardiovascular disease, I have ordered repeat TTE to assess cardiac function prior to starting TKI  Opioid-induced constipation -Secondary to opioid use after back surgery -I counseled the patient on the importance of maintaining regular bowel regimen, including Miralax, docusate and Senna, as needed  Orders Placed This Encounter  Procedures  . CBC with Differential (Cancer Center Only)    Standing Status:   Future   Standing Expiration Date:   06/26/2020  . CMP ([facility] only)    Standing Status:   Future    Standing Expiration Date:   06/26/2020  . Lactate dehydrogenase    Standing Status:   Future    Standing Expiration Date:   06/26/2020  . ECHOCARDIOGRAM COMPLETE    Standing Status:   Future    Standing Expiration Date:   08/22/2020    Order Specific Question:   Where should this test be performed    Answer:   [facility]    Order Specific Question:   Perflutren DEFINITY (image enhancing agent) should be administered unless hypersensitivity or allergy exist    Answer:   Administer Perflutren    Order Specific Question:   Reason for exam-Echo    Answer:   Chemotherapy evaluation  v87.41 / v58.11    Order Specific Question:   Reason for exam-Echo    Answer:   Atrial Fibrillation  427.31 / I48.91   All questions were answered. The patient knows to call the clinic with any problems, questions or concerns. No barriers to learning was detected.  A total of more than 40 minutes were spent face-to-face with the patient during this encounter and over half of that time was spent on counseling and coordination of care as outlined above.  Return on 06/09/2019 for labs, bone marrow bx results, and appt to discuss treatment options.   Tish Men, MD 05/23/2019 12:29 PM  CHIEF COMPLAINT: "My back pain is much better"  INTERVAL HISTORY: Lance Hubbard returns to clinic for follow-up of newly diagnosed CML.  The patient recently underwent laminectomy at L4-L5 on 05/18/2019.  He reports that after the surgery, the lower extremity  pain has improved significantly.  He was prescribed opioid medication after the surgery, which he took a few doses and developed severe constipation, for which he had to go to the ER for further evaluation.  He was recommended to take MiraLAX, which she has been taking twice a day, with resolution of constipation.  His back pain is tolerable, and he has not taken them additional opioid  medication.  He otherwise feels well today, and he denies any constitutional symptoms.  SUMMARY OF ONCOLOGIC HISTORY: Oncology History   No history exists.    REVIEW OF SYSTEMS:   Constitutional: ( - ) fevers, ( - )  chills , ( - ) night sweats Eyes: ( - ) blurriness of vision, ( - ) double vision, ( - ) watery eyes Ears, nose, mouth, throat, and face: ( - ) mucositis, ( - ) sore throat Respiratory: ( - ) cough, ( - ) dyspnea, ( - ) wheezes Cardiovascular: ( - ) palpitation, ( - ) chest discomfort, ( - ) lower extremity swelling Gastrointestinal:  ( - ) nausea, ( - ) heartburn, ( - ) change in bowel habits Skin: ( - ) abnormal skin rashes Lymphatics: ( - ) new lymphadenopathy, ( - ) easy bruising Neurological: ( - ) numbness, ( - ) tingling, ( - ) new weaknesses Behavioral/Psych: ( - ) mood change, ( - ) new changes  All other systems were reviewed with the patient and are negative.  I have reviewed the past medical history, past surgical history, social history and family history with the patient and they are unchanged from previous note.  ALLERGIES:  is allergic to amoxicillin.  MEDICATIONS:  Current Outpatient Medications  Medication Sig Dispense Refill  . acetaminophen (TYLENOL) 500 MG tablet Take 1,500 mg by mouth 2 (two) times a day.    [name] amiodarone (PACERONE) 200 MG tablet Take 1 tablet (200 mg total) by mouth 2 (two) times daily. (Patient taking differently: Take 100 mg by mouth every Monday, Wednesday, and Friday. ) 30 tablet 3  . apixaban (ELIQUIS) 5 MG TABS tablet Take 1 tablet (5 mg total) by mouth every 12 (twelve) hours. 60 tablet 3  . clobetasol (TEMOVATE) 0.05 % external solution Apply 1 application topically 2 (two) times daily as needed (for bug bites).     [name] HYDROcodone-acetaminophen (NORCO/VICODIN) 5-325 MG tablet Take 0.5-1 tablets by mouth every 4 (four) hours as needed (pain). 30 tablet 0  . losartan-hydrochlorothiazide (HYZAAR) 100-12.5 MG tablet Take 1 tablet  by mouth daily.     Vladimir Faster Glycol-Propyl Glycol (SYSTANE ULTRA) 0.4-0.3 % SOLN Place 1-2 drops into both eyes 3 (three) times daily as needed (for dry eyes).     No current facility-administered medications for this visit.     PHYSICAL EXAMINATION: ECOG PERFORMANCE STATUS: 2 - Symptomatic, <50% confined to bed  Today's Vitals   05/23/19 1205  BP: 126/77  Pulse: 70  Resp: 18  Temp: (!) 97.1 F (36.2 C)  TempSrc: Temporal  SpO2: 100%  Weight: 215 lb (97.5 kg)  Height: 6' 6"  (1.981 m)  PainSc: 0-No pain   Body mass index is 24.85 kg/m.  Filed Weights   05/23/19 1205  Weight: 215 lb (97.5 kg)    GENERAL: alert, no distress and comfortable sitting in a wheelchair SKIN: skin color, texture, turgor are normal, no rashes or significant lesions EYES: conjunctiva are pink and non-injected, sclera clear OROPHARYNX: no exudate, no erythema; lips, buccal mucosa, and tongue normal  NECK:  supple, non-tender LYMPH:  no palpable lymphadenopathy in the cervical LUNGS: clear to auscultation with normal breathing effort HEART: regular rate & rhythm and no murmurs and no lower extremity edema ABDOMEN: soft, non-tender, non-distended, normal bowel sounds Musculoskeletal: no cyanosis of digits and no clubbing  PSYCH: alert & oriented x 3, fluent speech  LABORATORY DATA:  I have reviewed the data as listed    Component Value Date/Time   NA 136 05/23/2019 1058   K 3.9 05/23/2019 1058   CL 102 05/23/2019 1058   CO2 26 05/23/2019 1058   GLUCOSE 97 05/23/2019 1058   BUN 14 05/23/2019 1058   CREATININE 0.77 05/23/2019 1058   CALCIUM 8.1 (L) 05/23/2019 1058   PROT 6.5 05/23/2019 1058   ALBUMIN 3.4 (L) 05/23/2019 1058   AST 12 (L) 05/23/2019 1058   ALT 23 05/23/2019 1058   ALKPHOS 50 05/23/2019 1058   BILITOT 0.7 05/23/2019 1058   GFRNONAA >60 05/23/2019 1058   GFRAA >60 05/23/2019 1058    No results found for: SPEP, UPEP  Lab Results  Component Value Date   WBC 32.7 (H)  05/23/2019   NEUTROABS 21.0 (H) 05/23/2019   HGB 12.5 (L) 05/23/2019   HCT 37.5 (L) 05/23/2019   MCV 91.9 05/23/2019   PLT 240 05/23/2019      Chemistry      Component Value Date/Time   NA 136 05/23/2019 1058   K 3.9 05/23/2019 1058   CL 102 05/23/2019 1058   CO2 26 05/23/2019 1058   BUN 14 05/23/2019 1058   CREATININE 0.77 05/23/2019 1058      Component Value Date/Time   CALCIUM 8.1 (L) 05/23/2019 1058   ALKPHOS 50 05/23/2019 1058   AST 12 (L) 05/23/2019 1058   ALT 23 05/23/2019 1058   BILITOT 0.7 05/23/2019 1058       RADIOGRAPHIC STUDIES: I have personally reviewed the radiological images as listed below and agreed with the findings in the report. Dg Chest 2 View  Result Date: 05/02/2019 CLINICAL DATA:  Pre-admit for lumbar stenosis. EXAM: CHEST - 2 VIEW COMPARISON:  December 16, 2013 FINDINGS: The heart size and mediastinal contours are within normal limits. Both lungs are clear. The visualized skeletal structures are unremarkable. IMPRESSION: No active cardiopulmonary disease. Electronically Signed   By: Dorise Bullion III M.D   On: 05/02/2019 10:43   Dg Lumbar Spine 2-3 Views  Result Date: 05/17/2019 CLINICAL DATA:  L4-5 laminectomy EXAM: LUMBAR SPINE - 2-3 VIEW COMPARISON:  04/25/2019 FINDINGS: First lateral intraoperative image demonstrates a posterior needle overlying the L3 spinous process. Second lateral intraoperative image demonstrates posterior surgical instruments directed at the L4-5 disc space. IMPRESSION: Intraoperative imaging as above. Electronically Signed   By: Rolm Baptise M.D.   On: 05/17/2019 13:33   Mr Lumbar Spine Wo Contrast  Result Date: 04/24/2019 CLINICAL DATA:  Lumbar radiculopathy.  Low back pain EXAM: MRI LUMBAR SPINE WITHOUT CONTRAST TECHNIQUE: Multiplanar, multisequence MR imaging of the lumbar spine was performed. No intravenous contrast was administered. COMPARISON:  Lumbar MRI 05/04/2010 FINDINGS: Segmentation:  Normal Alignment:  Normal  Vertebrae:  Negative for fracture or mass. Conus medullaris and cauda equina: Conus extends to the L1-2 level. Conus and cauda equina appear normal. Paraspinal and other soft tissues: Negative for paraspinous mass or adenopathy. Disc levels: L1-2: Mild disc degeneration.  Negative for stenosis L2-3: Mild disc and mild facet degeneration.  Negative for stenosis. L3-4: Disc bulging and facet hypertrophy. Mild spinal stenosis. Neural foramina patent bilaterally  L4-5: Broad-based central disc protrusion. Moderate to severe facet degeneration with facet and ligament hypertrophy and bilateral facet joint effusions. Severe spinal stenosis and severe subarticular stenosis bilaterally. L5-S1: Disc degeneration and spondylosis. Bilateral facet hypertrophy. Moderate to severe subarticular and foraminal stenosis bilaterally. 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 Significant progression since 2011 Moderate to severe subarticular and foraminal stenosis bilaterally L5-S1 with mild progression since 2011. Electronically Signed   By: Franchot Gallo M.D.   On: 04/24/2019 20:33

## 2019-05-23 ENCOUNTER — Other Ambulatory Visit: Payer: Self-pay

## 2019-05-23 ENCOUNTER — Telehealth: Payer: Self-pay | Admitting: Hematology

## 2019-05-23 ENCOUNTER — Inpatient Hospital Stay (HOSPITAL_BASED_OUTPATIENT_CLINIC_OR_DEPARTMENT_OTHER): Payer: Medicare HMO | Admitting: Hematology

## 2019-05-23 ENCOUNTER — Encounter: Payer: Self-pay | Admitting: Hematology

## 2019-05-23 ENCOUNTER — Inpatient Hospital Stay: Payer: Medicare HMO

## 2019-05-23 VITALS — BP 126/77 | HR 70 | Temp 97.1°F | Resp 18 | Ht 78.0 in | Wt 215.0 lb

## 2019-05-23 DIAGNOSIS — K5903 Drug induced constipation: Secondary | ICD-10-CM

## 2019-05-23 DIAGNOSIS — C9210 Chronic myeloid leukemia, BCR/ABL-positive, not having achieved remission: Secondary | ICD-10-CM | POA: Diagnosis not present

## 2019-05-23 DIAGNOSIS — D72829 Elevated white blood cell count, unspecified: Secondary | ICD-10-CM | POA: Diagnosis not present

## 2019-05-23 DIAGNOSIS — T402X5A Adverse effect of other opioids, initial encounter: Secondary | ICD-10-CM

## 2019-05-23 DIAGNOSIS — I4891 Unspecified atrial fibrillation: Secondary | ICD-10-CM | POA: Diagnosis not present

## 2019-05-23 LAB — CMP (CANCER CENTER ONLY)
ALT: 23 U/L (ref 0–44)
AST: 12 U/L — ABNORMAL LOW (ref 15–41)
Albumin: 3.4 g/dL — ABNORMAL LOW (ref 3.5–5.0)
Alkaline Phosphatase: 50 U/L (ref 38–126)
Anion gap: 8 (ref 5–15)
BUN: 14 mg/dL (ref 8–23)
CO2: 26 mmol/L (ref 22–32)
Calcium: 8.1 mg/dL — ABNORMAL LOW (ref 8.9–10.3)
Chloride: 102 mmol/L (ref 98–111)
Creatinine: 0.77 mg/dL (ref 0.61–1.24)
GFR, Est AFR Am: >60 mL/min (ref >60)
GFR, Estimated: >60 mL/min (ref >60)
Glucose, Bld: 97 mg/dL (ref 70–99)
Potassium: 3.9 mmol/L (ref 3.5–5.1)
Sodium: 136 mmol/L (ref 135–145)
Total Bilirubin: 0.7 mg/dL (ref 0.3–1.2)
Total Protein: 6.5 g/dL (ref 6.5–8.1)

## 2019-05-23 LAB — CBC WITH DIFFERENTIAL (CANCER CENTER ONLY)
Abs Immature Granulocytes: 4.73 10*3/uL — ABNORMAL HIGH (ref 0.00–0.07)
Basophils Absolute: 0.6 10*3/uL — ABNORMAL HIGH (ref 0.0–0.1)
Basophils Relative: 2 %
Eosinophils Absolute: 0.5 10*3/uL (ref 0.0–0.5)
Eosinophils Relative: 2 %
HCT: 37.5 % — ABNORMAL LOW (ref 39.0–52.0)
Hemoglobin: 12.5 g/dL — ABNORMAL LOW (ref 13.0–17.0)
Immature Granulocytes: 15 %
Lymphocytes Relative: 11 %
Lymphs Abs: 3.4 10*3/uL (ref 0.7–4.0)
MCH: 30.6 pg (ref 26.0–34.0)
MCHC: 33.3 g/dL (ref 30.0–36.0)
MCV: 91.9 fL (ref 80.0–100.0)
Monocytes Absolute: 2.4 10*3/uL — ABNORMAL HIGH (ref 0.1–1.0)
Monocytes Relative: 7 %
Neutro Abs: 21 10*3/uL — ABNORMAL HIGH (ref 1.7–7.7)
Neutrophils Relative %: 63 %
Platelet Count: 240 10*3/uL (ref 150–400)
RBC: 4.08 MIL/uL — ABNORMAL LOW (ref 4.22–5.81)
RDW: 14 % (ref 11.5–15.5)
WBC Count: 32.7 10*3/uL — ABNORMAL HIGH (ref 4.0–10.5)
nRBC: 0 % (ref 0.0–0.2)

## 2019-05-23 LAB — LACTATE DEHYDROGENASE: LDH: 203 U/L — ABNORMAL HIGH (ref 98–192)

## 2019-05-23 LAB — SAVE SMEAR(SSMR), FOR PROVIDER SLIDE REVIEW

## 2019-05-23 NOTE — Patient Instructions (Signed)
Chronic Myelogenous Leukemia Chronic myelogenous leukemia (CML) is a rare form of cancer of the blood cells. The term "chronic" means that the leukemia develops more slowly than other kinds of leukemia. CML results from an abnormal chromosome called the [place] chromosome. The chromosome causes the body to make too many abnormal white blood cells, which keeps the body from making enough healthy blood cells such as red blood cells, white blood cells, and platelets. There are three phases of this condition:  Chronic phase. During this phase, leukemia cells grow slowly. There are minimal symptoms during this phase.  Accelerated phase. During this phase, there is an increase in the percentage of the leukemia cells in the bone marrow.  Blast phase. During this phase, there are many leukemia cells in the bone marrow. The leukemia cells are most aggressive and difficult to control during this phase. What are the causes? The cause of this condition is not known. What increases the risk? You are more likely to develop this condition if:  You are 49 years and older.  You are male.  You have had radiation treatment before.  You have the [place] chromosome. What are the signs or symptoms? Symptoms of this condition include:  Feeling more tired than usual.  Pain in the joints.  Unplanned weight loss.  Feeling tired even after resting.  Heavy sweating at night.  Fever.  A feeling of fullness in the upper left part of the abdomen.  Paleness.  Easy bruising or bleeding.  Frequent infections. It takes a while for symptoms to occur. There may be no symptoms in the early stages of this condition. How is this diagnosed? This condition may be diagnosed with:  A physical exam. This will check for an enlarged spleen, liver, or lymph nodes.  Complete blood count with differential. This test measures the characteristics of white blood cells, red blood cells, and platelets.  Bone  marrow tests.  Imaging test. A CT scan is used to check for swelling or anything abnormal in your spleen, liver, and lymph nodes.  Tests to screen for the [place] chromosome and other genetic abnormalities. These may include: ? Cytogenetic analysis. This is a test that is used to show the number and the appearance of chromosomes in the cell. Any damage to the chromosome can be discovered by this test. ? Fluorescence in situ hybridization. This test is used to examine defects in chromosomes and how those defects affect the functioning of the cell. Results from a Boyd test will be used to determine treatment and assess the outcome of that treatment. ? The reverse transcription polymerase chain reaction test. This test is used to detect problems in the chromosome, including the presence of the [place] chromosome in the cell. How is this treated? Treatment for this condition depends on the phase of the disease. Treatment may include:  Targeted medicines to treat specific gene mutations. These are medicines that work to stop the leukemia cells from growing and multiplying. They identify and attack specific leukemia cells without harming normal cells.  Chemotherapy. This treatment uses medicines to kill leukemia cells.  Biological therapy (immunotherapy). This treatment boosts the ability of your immune system to fight the leukemia cells.  Bone marrow or peripheral blood stem cell transplant. This treatment replaces your own bone marrow or stem cells with bone marrow or stem cells from a donor. This treatment may be done if you received very high doses of chemotherapy or radiation that killed your stem cells and bone marrow.  Surgery to remove the spleen.  New treatments through clinical trials.  Additional medicines may be needed to help manage symptoms. Additional medicines may be needed to help manage symptoms. Follow these instructions at home: Medicines  Take over-the-counter and  prescription medicines only as told by your health care provider.  If you were prescribed an antibiotic medicine, take it as told by your health care provider. Do not stop taking the antibiotic even if you start to feel better. If you are on chemotherapy:  Wash your hands often, especially before meals, after being outside, and after using the toilet. Have visitors do the same.  Keep your teeth and gums clean and well cared for. Use soft toothbrushes.  Protect your skin from the sun by using sunscreen and wearing protective clothing.  Make sure that your family members get a flu shot (influenza vaccine) every year. General instructions  Avoid contact sports or other rough activities. Ask your health care provider what activities are safe for you.  Avoid crowded places and people who are sick.  Tell your cancer care team if you develop side effects. They may be able to recommend ways to relieve them.  Try to eat regular, healthy meals. Some of your treatments might affect your appetite.  Find healthy ways of coping with stress, such as by doing yoga or meditation or by joining a support group.  Keep all follow-up visits as told by your health care provider. This is important. Where to find more information  American Cancer Society: www.cancer.org  Leukemia and Lymphoma Society: PreviewPal.pl  National Cancer Institute ([place]): www.cancer.gov Contact a health care provider if:  You feel lightheaded.  You develop bleeding from your gums or nose.  You cannot eat or drink without vomiting. Get help right away if:  You have a fever or chills.  You develop chest pain.  You have trouble breathing, or you feel short of breath.  You faint.  There is blood in your urine or stool.  You have excessive bleeding.  Have any severe or uncontrolled symptoms. Summary  CML is a chronic leukemia that produces abnormal white blood cells in the body.  This condition results from an  abnormal chromosome called the [place] chromosome. The chromosome causes the body to make too many abnormal white blood cells, which keeps healthy blood cells from developing and working well in the body.  Symptoms are a result of a decreased number of normal blood cells and an enlarged spleen. Treatment is based on the phase of the disease. This information is not intended to replace advice given to you by your health care provider. Make sure you discuss any questions you have with your health care provider. Document Released: 09/24/2008 Document Revised: 09/24/2017 Document Reviewed: 09/23/2016 Elsevier Patient Education  2020 [employer].

## 2019-05-23 NOTE — Telephone Encounter (Signed)
Appointments scheduled Hoyle Sauer will provide patient with updated schedule per 7/28 los

## 2019-05-26 ENCOUNTER — Encounter (HOSPITAL_COMMUNITY): Payer: Self-pay | Admitting: Neurosurgery

## 2019-05-30 ENCOUNTER — Ambulatory Visit (HOSPITAL_COMMUNITY): Payer: Medicare HMO | Attending: Cardiovascular Disease

## 2019-05-30 ENCOUNTER — Other Ambulatory Visit: Payer: Self-pay

## 2019-05-30 DIAGNOSIS — C9210 Chronic myeloid leukemia, BCR/ABL-positive, not having achieved remission: Secondary | ICD-10-CM | POA: Insufficient documentation

## 2019-05-30 DIAGNOSIS — I4891 Unspecified atrial fibrillation: Secondary | ICD-10-CM | POA: Diagnosis not present

## 2019-05-31 ENCOUNTER — Telehealth: Payer: Self-pay | Admitting: *Deleted

## 2019-05-31 NOTE — Telephone Encounter (Signed)
Patient called and stated,"when do I stop taking Eliquis? I have a bone marrow biopsy this Friday?" Per Dr. Maylon Peppers, stop two days prior to procedure. Don't take any today, tomorrow or Friday. He verbalized understanding.

## 2019-06-02 ENCOUNTER — Inpatient Hospital Stay: Payer: Medicare HMO

## 2019-06-02 ENCOUNTER — Inpatient Hospital Stay: Payer: Medicare HMO | Attending: Hematology | Admitting: Adult Health

## 2019-06-02 ENCOUNTER — Other Ambulatory Visit: Payer: Self-pay

## 2019-06-02 VITALS — BP 123/71 | HR 70 | Temp 98.5°F | Resp 16

## 2019-06-02 DIAGNOSIS — C9210 Chronic myeloid leukemia, BCR/ABL-positive, not having achieved remission: Secondary | ICD-10-CM

## 2019-06-02 DIAGNOSIS — Z7901 Long term (current) use of anticoagulants: Secondary | ICD-10-CM | POA: Insufficient documentation

## 2019-06-02 DIAGNOSIS — I4891 Unspecified atrial fibrillation: Secondary | ICD-10-CM | POA: Diagnosis not present

## 2019-06-02 DIAGNOSIS — I509 Heart failure, unspecified: Secondary | ICD-10-CM | POA: Diagnosis not present

## 2019-06-02 LAB — CMP (CANCER CENTER ONLY)
ALT: 26 U/L (ref 0–44)
AST: 15 U/L (ref 15–41)
Albumin: 3.6 g/dL (ref 3.5–5.0)
Alkaline Phosphatase: 74 U/L (ref 38–126)
Anion gap: 10 (ref 5–15)
BUN: 15 mg/dL (ref 8–23)
CO2: 27 mmol/L (ref 22–32)
Calcium: 9.4 mg/dL (ref 8.9–10.3)
Chloride: 101 mmol/L (ref 98–111)
Creatinine: 0.93 mg/dL (ref 0.61–1.24)
GFR, Est AFR Am: >60 mL/min (ref >60)
GFR, Estimated: >60 mL/min (ref >60)
Glucose, Bld: 111 mg/dL — ABNORMAL HIGH (ref 70–99)
Potassium: 4.2 mmol/L (ref 3.5–5.1)
Sodium: 138 mmol/L (ref 135–145)
Total Bilirubin: 0.3 mg/dL (ref 0.3–1.2)
Total Protein: 7.4 g/dL (ref 6.5–8.1)

## 2019-06-02 LAB — CBC WITH DIFFERENTIAL (CANCER CENTER ONLY)
Abs Immature Granulocytes: 2.89 10*3/uL — ABNORMAL HIGH (ref 0.00–0.07)
Basophils Absolute: 0.5 10*3/uL — ABNORMAL HIGH (ref 0.0–0.1)
Basophils Relative: 2 %
Eosinophils Absolute: 0.3 10*3/uL (ref 0.0–0.5)
Eosinophils Relative: 1 %
HCT: 41.2 % (ref 39.0–52.0)
Hemoglobin: 13.3 g/dL (ref 13.0–17.0)
Immature Granulocytes: 12 %
Lymphocytes Relative: 14 %
Lymphs Abs: 3.4 10*3/uL (ref 0.7–4.0)
MCH: 30.2 pg (ref 26.0–34.0)
MCHC: 32.3 g/dL (ref 30.0–36.0)
MCV: 93.6 fL (ref 80.0–100.0)
Monocytes Absolute: 1.6 10*3/uL — ABNORMAL HIGH (ref 0.1–1.0)
Monocytes Relative: 7 %
Neutro Abs: 15.2 10*3/uL — ABNORMAL HIGH (ref 1.7–7.7)
Neutrophils Relative %: 64 %
Platelet Count: 331 10*3/uL (ref 150–400)
RBC: 4.4 MIL/uL (ref 4.22–5.81)
RDW: 14.3 % (ref 11.5–15.5)
WBC Count: 23.8 10*3/uL — ABNORMAL HIGH (ref 4.0–10.5)
nRBC: 0 % (ref 0.0–0.2)

## 2019-06-02 LAB — LACTATE DEHYDROGENASE: LDH: 168 U/L (ref 98–192)

## 2019-06-02 NOTE — Progress Notes (Signed)
In review of patient's home meds, noted taking Eliquis. Lance Hubbard advises his last dose was taken on 05/30/2019 per his doctor's request. States he will f/u with MD on Tuesday, 8/11. Procedure performed and patient tolerated well with no issues. Hemostasis achieved quickly with direct pressure only. Sterile pressure dressing applied to area. At time of discharge, dressing was clean, dry, and intact. Patient had no complaints. Escorted to lobby to await lab draw.

## 2019-06-02 NOTE — Patient Instructions (Signed)

## 2019-06-02 NOTE — Progress Notes (Signed)
INDICATION: CML   Bone Marrow Biopsy and Aspiration Procedure Note   Informed consent was obtained and potential risks including bleeding, infection and pain were reviewed with the patient.  The patient's name, date of birth, identification, consent and allergies were verified prior to the start of procedure and time out was performed.  The right posterior iliac crest was chosen as the site of biopsy.  The skin was prepped with ChloraPrep.   10 cc of 2% lidocaine was used to provide local anaesthesia.   10 cc of bone marrow aspirate was obtained followed by 1cm biopsy.  Pressure was applied to the biopsy site and bandage was placed over the biopsy site. Patient was made to lie on the back for 30 mins prior to discharge.  The procedure was tolerated well. COMPLICATIONS: None BLOOD LOSS: none The patient was discharged home in stable condition with a 1 week follow up to review results.  Patient was provided with post bone marrow biopsy instructions and instructed to call if there was any bleeding or worsening pain.  Specimens sent for flow cytometry, cytogenetics and additional studies.  Signed Scot Dock, NP

## 2019-06-08 LAB — JAK2 (INCLUDING V617F AND EXON 12), MPL,& CALR W/RFL MPN PANEL (NGS)

## 2019-06-08 LAB — BCR ABL1 FISH (GENPATH)

## 2019-06-09 ENCOUNTER — Other Ambulatory Visit: Payer: Self-pay

## 2019-06-09 ENCOUNTER — Other Ambulatory Visit (HOSPITAL_BASED_OUTPATIENT_CLINIC_OR_DEPARTMENT_OTHER): Payer: Self-pay

## 2019-06-09 ENCOUNTER — Inpatient Hospital Stay: Payer: Medicare HMO

## 2019-06-09 ENCOUNTER — Inpatient Hospital Stay (HOSPITAL_BASED_OUTPATIENT_CLINIC_OR_DEPARTMENT_OTHER): Payer: Medicare HMO | Admitting: Hematology

## 2019-06-09 ENCOUNTER — Encounter: Payer: Self-pay | Admitting: Hematology

## 2019-06-09 ENCOUNTER — Telehealth: Payer: Self-pay | Admitting: *Deleted

## 2019-06-09 ENCOUNTER — Other Ambulatory Visit: Payer: Self-pay | Admitting: Hematology

## 2019-06-09 VITALS — BP 128/75 | HR 63 | Temp 98.0°F | Resp 18 | Ht 78.0 in | Wt 217.8 lb

## 2019-06-09 DIAGNOSIS — I4891 Unspecified atrial fibrillation: Secondary | ICD-10-CM

## 2019-06-09 DIAGNOSIS — C9210 Chronic myeloid leukemia, BCR/ABL-positive, not having achieved remission: Secondary | ICD-10-CM

## 2019-06-09 DIAGNOSIS — Z7189 Other specified counseling: Secondary | ICD-10-CM | POA: Insufficient documentation

## 2019-06-09 LAB — CBC WITH DIFFERENTIAL (CANCER CENTER ONLY)
Abs Immature Granulocytes: 2.16 10*3/uL — ABNORMAL HIGH (ref 0.00–0.07)
Basophils Absolute: 0.4 10*3/uL — ABNORMAL HIGH (ref 0.0–0.1)
Basophils Relative: 2 %
Eosinophils Absolute: 0.3 10*3/uL (ref 0.0–0.5)
Eosinophils Relative: 1 %
HCT: 40.8 % (ref 39.0–52.0)
Hemoglobin: 13.3 g/dL (ref 13.0–17.0)
Immature Granulocytes: 10 %
Lymphocytes Relative: 17 %
Lymphs Abs: 3.6 10*3/uL (ref 0.7–4.0)
MCH: 30.3 pg (ref 26.0–34.0)
MCHC: 32.6 g/dL (ref 30.0–36.0)
MCV: 92.9 fL (ref 80.0–100.0)
Monocytes Absolute: 1.6 10*3/uL — ABNORMAL HIGH (ref 0.1–1.0)
Monocytes Relative: 8 %
Neutro Abs: 13.6 10*3/uL — ABNORMAL HIGH (ref 1.7–7.7)
Neutrophils Relative %: 62 %
Platelet Count: 269 10*3/uL (ref 150–400)
RBC: 4.39 MIL/uL (ref 4.22–5.81)
RDW: 14.3 % (ref 11.5–15.5)
WBC Count: 21.7 10*3/uL — ABNORMAL HIGH (ref 4.0–10.5)
nRBC: 0 % (ref 0.0–0.2)

## 2019-06-09 LAB — CMP (CANCER CENTER ONLY)
ALT: 16 U/L (ref 0–44)
AST: 13 U/L — ABNORMAL LOW (ref 15–41)
Albumin: 3.9 g/dL (ref 3.5–5.0)
Alkaline Phosphatase: 60 U/L (ref 38–126)
Anion gap: 7 (ref 5–15)
BUN: 19 mg/dL (ref 8–23)
CO2: 30 mmol/L (ref 22–32)
Calcium: 8.9 mg/dL (ref 8.9–10.3)
Chloride: 100 mmol/L (ref 98–111)
Creatinine: 0.92 mg/dL (ref 0.61–1.24)
GFR, Est AFR Am: >60 mL/min (ref >60)
GFR, Estimated: >60 mL/min (ref >60)
Glucose, Bld: 98 mg/dL (ref 70–99)
Potassium: 4.1 mmol/L (ref 3.5–5.1)
Sodium: 137 mmol/L (ref 135–145)
Total Bilirubin: 0.4 mg/dL (ref 0.3–1.2)
Total Protein: 6.6 g/dL (ref 6.5–8.1)

## 2019-06-09 MED ORDER — DASATINIB 100 MG PO TABS: 100.0000 mg | ORAL_TABLET | Freq: Every day | ORAL | 11 refills | Status: DC

## 2019-06-09 NOTE — Patient Instructions (Signed)
Dasatinib oral tablet What is this medicine? DASATINIB (da SA ti nib) is a medicine that targets specific proteins in cancer cells and stops the cancer cells from growing. It is used to treat chronic myeloid leukemia and acute lymphoblastic leukemia. This medicine may be used for other purposes; ask your health care provider or pharmacist if you have questions. COMMON BRAND NAME(S): Sprycel What should I tell my health care provider before I take this medicine? They need to know if you have any of these conditions:  bleeding problems  heart disease  immune system problems  irregular heartbeat  low levels of magnesium or potassium in the blood  an unusual or allergic reaction to dasatinib, lactose, other medicines, foods, dyes, or preservatives  pregnant or trying to get pregnant  breast-feeding How should I use this medicine? Take this medicine by mouth with a glass of water. Follow the directions on the prescription label. You can take it with or without food. Do not take with grapefruit juice. Do not cut, crush or chew this medicine. Take your medicine at regular intervals. Do not take your medicine more often than directed. Do not stop taking except on your doctor's advice. Avoid taking antacids containing aluminum, calcium, or magnesium within 2 hours of taking this medicine. You can take such antacids up to 2 hours before or 2 hours after this medicine. Avoid taking all other medicines that reduce stomach acid. Talk to your pediatrician regarding the use of this medicine in children. While this drug may be prescribed for children as young as 1 year old for selected conditions, precautions do apply. Overdosage: If you think you have taken too much of this medicine contact a poison control center or emergency room at once. NOTE: This medicine is only for you. Do not share this medicine with others. What if I miss a dose? If you miss a dose, take your next scheduled dose at its regular  time. Do not take double or extra doses. Talk to your doctor if you are not sure what to do. What may interact with this medicine? Do not take this medicine with any of the following medications:  certain medicines for fungal infections like fluconazole, itraconazole, ketoconazole, posaconazole, voriconazole  certain medicines for stomach problems like cimetidine, famotidine, ranitidine, omeprazole  cisapride  dronedarone  ergot alkaloids like dihydroergotamine, ergonovine, ergotamine, methylergonovine  pimozide  St. Terel's Wort  thioridazine This medicine may also interact with the following medications:  acetaminophen  alfentanil  antiviral medicines for HIV or AIDS  aspirin  carbamazepine  certain antibiotics like clarithromycin, erythromycin, rifampin, rifabutin, rifapentine, telithromycin, troleandomycin  certain medicines for cholesterol like atorvastatin  certain medicines that treat or prevent blood clots like warfarin  cyclosporine  dexamethasone  dofetilide  fentanyl  NSAIDS, medicines for pain and inflammation, like ibuprofen, ketoprofen, naproxen  other medicines that prolong the QT interval (cause an abnormal heart rhythm)  phenobarbital  phenytoin  sirolimus  tacrolimus  ziprasidone This list may not describe all possible interactions. Give your health care provider a list of all the medicines, herbs, non-prescription drugs, or dietary supplements you use. Also tell them if you smoke, drink alcohol, or use illegal drugs. Some items may interact with your medicine. What should I watch for while using this medicine? Visit your doctor for regular check ups. Report any side effects. Continue your course of treatment unless your doctor tells you to stop. You may need blood work done while you are taking this medicine. Call your doctor  or health care professional for advice if you get a fever, chills or sore throat, or other symptoms of a cold or  flu. Do not treat yourself. This drug decreases your body's ability to fight infections. Try to avoid being around people who are sick. This medicine may increase your risk to bruise or bleed. Call your doctor or health care professional if you notice any unusual bleeding. Height and weight growth of a child taking this medicine will be monitored closely. Do not become pregnant while taking this medicine or for 30 days after stopping it. Women should inform their doctor if they wish to become pregnant or think they might be pregnant. There is a potential for serious side effects to an unborn child. Talk to your health care professional or pharmacist for more information. Do not breast-feed an infant while taking this medicine and for 2 weeks after the last dose. What side effects may I notice from receiving this medicine? Side effects that you should report to your doctor or health care professional as soon as possible:  allergic reactions like skin rash, itching or hives, swelling of the face, lips, or tongue  bone pain  breast enlargement in both males and females  breathing problems  chest pain  dry cough  fast, irregular heartbeat  low blood counts - this medicine may decrease the number of white blood cells, red blood cells, and platelets. You may be at increased risk for infections and bleeding.  muscle cramps  seizures  signs of infection - fever or chills, cough, sore throat, pain or difficulty passing urine  signs of decreased platelets or bleeding - bruising, pinpoint red spots on the skin, black, tarry stools, blood in the urine, nosebleeds  signs of decreased red blood cells - unusual weakness or tiredness, fainting spells, lightheadedness  redness, blistering, peeling or loosening of the skin, including inside the mouth  swelling of the legs or ankles, or other parts of the body  sudden weight gain Side effects that usually do not require medical attention (report  to your doctor or health care professional if they continue or are bothersome):  constipation  decreased appetite  diarrhea  headache  muscle pain  nausea, vomiting  stomach pain This list may not describe all possible side effects. Call your doctor for medical advice about side effects. You may report side effects to FDA at 1-800-FDA-1088. Where should I keep my medicine? Keep out of the reach of children. Store at room temperature between 20 and 25 degrees C (68 and 77 degrees F). Throw away any unused medicine after the expiration date. NOTE: This sheet is a summary. It may not cover all possible information. If you have questions about this medicine, talk to your doctor, pharmacist, or health care provider.  2020 Elsevier/Gold Standard (2018-10-03 09:42:52)

## 2019-06-09 NOTE — Progress Notes (Signed)
[place] OFFICE PROGRESS NOTE  Patient Care Team: Christain Sacramento, MD as PCP - General (Family Medicine)  HEME/ONC OVERVIEW: 1. 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  -Some of the short term side-effects included, though not limited to, fluid retention (including pleural effusion), risk of fatigue, weight loss, tumor lysis syndrome, risk of allergic reactions, pancytopenia, life-threatening infections, need for transfusions of blood products, nausea, vomiting, change in bowel habits, admission to hospital for various reasons, and risks of death.  -Long term side-effects are also discussed including permanent damage to nerve function, chronic fatigue, and rare secondary malignancy including bone marrow disorders.  -The patient is aware that the response rates discussed earlier is not guaranteed.   -After a long discussion, patient made an informed decision to proceed with the prescribed plan of care.  -[facility] -Pending the date of his  medication approval, he will need weekly lab monitoring x 2 months after starting dasatinb to monitor for any cytopenias    Atrial fibrillation -S/p cardioversion in 2015 -Currently on Eliquis -He denies any symptoms of bleeding or excess bruising -Continue Eliquis for now  HFrEF -Recent echocardiogram showed LVEF 40-45% in 05/2019 -No evidence of volume overload on exam today -I provided the patient with a copy of his echocardiogram report, and strongly encouraged him to follow up with his cardiologist for further work-up as indicated  Goals of care discussion -I discussed with the patient that most CML responds very nicely to TKI therapy, the treatment is still not considered curative, and that the goal of treatment is for palliative intent -With that said, TKI can provide durable control of the disease, and sometimes prolonged molecular remission even when the medication is discontinued under certain circumstances  -Patient expressed understanding  Orders Placed This Encounter  Procedures  . CBC with Differential (Cancer Center Only)    Standing Status:   Standing    Number of Occurrences:   20    Standing Expiration Date:   06/08/2020  . CMP ([facility])    Standing Status:   Standing    Number of Occurrences:   20    Standing Expiration Date:   06/08/2020  . Lactate dehydrogenase    Standing Status:   Standing    Number of Occurrences:   20    Standing Expiration Date:   06/08/2020  . EKG 12-Lead   All questions were answered. The patient knows to call the clinic with any problems, questions or concerns. No barriers to learning was detected.  A total of more than 40 minutes were spent face-to-face with the patient during this encounter and over half of that time was spent on counseling and coordination of care as outlined above.   Tentatively return in 2 weeks for labs and clinic follow-up to assess dasatinib toxicity.  Tish Men, MD 06/09/2019 4:06 PM  CHIEF  COMPLAINT: "My back is much better"  INTERVAL HISTORY: Mr. Weekly returns clinic for follow-up of recently diagnosed CML.  Patient reports that his back pain continues to improve since recent surgery, and he is now able to walk without assistance from a cane.  He reports occasional mild swelling of his forehead at night during sleeping, but denies any drenching night sweats.  He otherwise denies any other constitutional symptoms.  He tolerated bone marrow biopsy well without any pain or bleeding.  He denies any other complaint today.  REVIEW OF SYSTEMS:   Constitutional: ( - ) fevers, ( - )  chills , ( + ) night sweats, mild Eyes: ( - ) blurriness of vision, ( - ) double vision, ( - ) watery eyes Ears, nose, mouth, throat, and face: ( - ) mucositis, ( - ) sore throat Respiratory: ( - ) cough, ( - ) dyspnea, ( - ) wheezes Cardiovascular: ( - ) palpitation, ( - ) chest discomfort, ( - ) lower extremity swelling Gastrointestinal:  ( - ) nausea, ( - ) heartburn, ( - ) change in bowel habits Skin: ( - ) abnormal skin rashes Lymphatics: ( - ) new lymphadenopathy, ( - ) easy bruising Neurological: ( - ) numbness, ( - ) tingling, ( - ) new weaknesses Behavioral/Psych: ( - ) mood change, ( - ) new changes  All other systems were reviewed with the patient and are negative.  I have reviewed the past medical history, past surgical history, social history and family history with the patient and they are unchanged from previous note.  ALLERGIES:  is allergic to amoxicillin.  MEDICATIONS:  Current Outpatient Medications  Medication Sig Dispense Refill  . acetaminophen (TYLENOL) 500 MG tablet Take 1,500 mg by mouth 2 (two) times a day.    [name] amiodarone (PACERONE) 200 MG tablet     . apixaban (ELIQUIS) 5 MG TABS tablet Take 1 tablet (5 mg total) by mouth every 12 (twelve) hours. 60 tablet 3  . clobetasol (TEMOVATE) 0.05 % external solution Apply 1 application topically 2 (two) times daily as needed (for  bug bites).     [name] losartan-hydrochlorothiazide (HYZAAR) 100-12.5 MG tablet Take 1 tablet by mouth daily.     Vladimir Faster Glycol-Propyl Glycol (SYSTANE ULTRA) 0.4-0.3 % SOLN Place 1-2 drops into both eyes 3 (three) times daily as needed (for dry eyes).     No current facility-administered medications for this visit.     PHYSICAL EXAMINATION: ECOG PERFORMANCE STATUS: 1 - Symptomatic but completely ambulatory  Today's Vitals   06/09/19 1444  BP: 128/75  Pulse: 63  Resp: 18  Temp: 98 F (36.7 C)  TempSrc: Temporal  SpO2: 99%  Weight: 217 lb 12.8 oz (98.8 kg)  Height: _0  (1.981 m)  PainSc: 0-No pain   Body mass index is 25.17 kg/m.  Filed Weights   06/09/19 1444  Weight: 217 lb 12.8 oz (98.8 kg)    GENERAL: alert, no distress and comfortable SKIN: skin color, texture, turgor are normal, no rashes or significant lesions EYES: conjunctiva are pink and non-injected, sclera clear OROPHARYNX: no exudate, no erythema; lips, buccal mucosa, and  tongue normal  NECK: supple, non-tender LYMPH:  no palpable lymphadenopathy in the cervical LUNGS: clear to auscultation with normal breathing effort HEART: regular rate & rhythm and no murmurs and no lower extremity edema ABDOMEN: soft, non-tender, non-distended, normal bowel sounds Musculoskeletal: no cyanosis of digits and no clubbing  PSYCH: alert & oriented x 3, fluent speech NEURO: no focal motor/sensory deficits  LABORATORY DATA:  I have reviewed the data as listed    Component Value Date/Time   NA 137 06/09/2019 1415   K 4.1 06/09/2019 1415   CL 100 06/09/2019 1415   CO2 30 06/09/2019 1415   GLUCOSE 98 06/09/2019 1415   BUN 19 06/09/2019 1415   CREATININE 0.92 06/09/2019 1415   CALCIUM 8.9 06/09/2019 1415   PROT 6.6 06/09/2019 1415   ALBUMIN 3.9 06/09/2019 1415   AST 13 (L) 06/09/2019 1415   ALT 16 06/09/2019 1415   ALKPHOS 60 06/09/2019 1415   BILITOT 0.4 06/09/2019 1415   GFRNONAA >60 06/09/2019 1415   GFRAA >60  06/09/2019 1415    No results found for: SPEP, UPEP  Lab Results  Component Value Date   WBC 21.7 (H) 06/09/2019   NEUTROABS 13.6 (H) 06/09/2019   HGB 13.3 06/09/2019   HCT 40.8 06/09/2019   MCV 92.9 06/09/2019   PLT 269 06/09/2019      Chemistry      Component Value Date/Time   NA 137 06/09/2019 1415   K 4.1 06/09/2019 1415   CL 100 06/09/2019 1415   CO2 30 06/09/2019 1415   BUN 19 06/09/2019 1415   CREATININE 0.92 06/09/2019 1415      Component Value Date/Time   CALCIUM 8.9 06/09/2019 1415   ALKPHOS 60 06/09/2019 1415   AST 13 (L) 06/09/2019 1415   ALT 16 06/09/2019 1415   BILITOT 0.4 06/09/2019 1415       RADIOGRAPHIC STUDIES: I have personally reviewed the radiological images as listed below and agreed with the findings in the report. Dg Lumbar Spine 2-3 Views  Result Date: 05/17/2019 CLINICAL DATA:  L4-5 laminectomy EXAM: LUMBAR SPINE - 2-3 VIEW COMPARISON:  04/25/2019 FINDINGS: First lateral intraoperative image demonstrates a posterior needle overlying the L3 spinous process. Second lateral intraoperative image demonstrates posterior surgical instruments directed at the L4-5 disc space. IMPRESSION: Intraoperative imaging as above. Electronically Signed   By: Rolm Baptise M.D.   On: 05/17/2019 13:33

## 2019-06-09 NOTE — Telephone Encounter (Signed)
Faxed EKG done in office to Dr. Terrence Dupont per patient request. Fax # [phone]. Received faxed verification. EKG placed in bin to be scanned into chart.

## 2019-06-09 NOTE — Progress Notes (Signed)
START ON PATHWAY REGIMEN - CML     Administer once daily:     Dasatinib   **Always confirm dose/schedule in your pharmacy ordering system**  Patient Characteristics: BCR-ABL Positive, Chronic Phase, First Line, Intermediate Risk  - Sokal Score 0.8 - 1.2 BCR-ABL Status: Positive Line of Therapy: First Line Sokal Score: Intermediate Risk - Sokal Score 0.8 - 1.2 Intent of Therapy: Non-Curative / Palliative Intent, Discussed with Patient

## 2019-06-10 LAB — HCV COMMENT:

## 2019-06-10 LAB — HEPATITIS B CORE ANTIBODY, TOTAL: Hep B Core Total Ab: NEGATIVE

## 2019-06-10 LAB — HEPATITIS B SURFACE ANTIGEN: Hepatitis B Surface Ag: NEGATIVE

## 2019-06-10 LAB — HEPATITIS B SURFACE ANTIBODY,QUALITATIVE: Hep B S Ab: NONREACTIVE

## 2019-06-10 LAB — HEPATITIS C ANTIBODY (REFLEX): HCV Ab: <0.1 s/co ratio (ref 0.0–0.9)

## 2019-06-12 ENCOUNTER — Telehealth: Payer: Self-pay | Admitting: Hematology

## 2019-06-12 ENCOUNTER — Telehealth: Payer: Self-pay | Admitting: Pharmacy Technician

## 2019-06-12 ENCOUNTER — Encounter (HOSPITAL_COMMUNITY): Payer: Self-pay | Admitting: Hematology

## 2019-06-12 ENCOUNTER — Telehealth: Payer: Self-pay | Admitting: Pharmacist

## 2019-06-12 LAB — LACTATE DEHYDROGENASE: LDH: 172 U/L (ref 98–192)

## 2019-06-12 NOTE — Telephone Encounter (Signed)
Oral Oncology Patient Advocate Encounter  Prior Authorization for Sprycel has been approved.    PA# [id number] Effective dates: 10/24/18 through 10/26/2019  Patients co-pay is $2113.32.  Due to high copay, we will seek foundations for copay assistance to reduce OOP cost.  Oral Oncology Clinic will continue to follow.

## 2019-06-12 NOTE — Telephone Encounter (Signed)
Oral Oncology Pharmacist Encounter  Received new prescription for Sprycel (dasatinib) for the treatment of newly diagnosed chronic phase CML, planned duration until disease progression or unacceptable drug toxicity.  CBC from 06/09/2019 assessed, no relevant lab abnormalities. 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 Due to the small incidence of congestive heart failure with dasatinib, I recommend repeating an ECHO once Mr. Legan is started on dasatinib. I would also encourage him to maintain a log of daily weights, if he is not already doing so, to monitor for early signs of heart failure exacerbation. Prescription dose and frequency assessed.   Current medication list in Epic reviewed, a few DDIs with dasatinib identified: -Amiodarone: amiodarone may enhance the QTc-prolonging effect of dasatinib. ECG from 8/14 showed a QTc of 416. Recommend rechecking a QTc once Mr. Reth is started on the combination of amiodarone and dasatinib -Apixaban: dasatinib may enhance the anticoagulant effect apixaban. Monitor the patient for s/sx of bleeding. No based line mediation adjustment required.  Prescription has been e-scribed to the [facility] for benefits analysis and approval.  Oral Oncology Clinic will continue to follow for insurance authorization, copayment issues, initial counseling and start date.  Darl Pikes, PharmD, BCPS, [facility] Hematology/Oncology Clinical Pharmacist [facility] [place] [phone]  06/12/2019 9:37 AM

## 2019-06-12 NOTE — Telephone Encounter (Signed)
Oral Oncology Patient Advocate Encounter   Submitted an online application for [facility] copay assistance through [employer] [place] ([employer]).    I spoke with the patient to inform them that [employer] would reach out to them by phone, email, or mail to let them know of the decision or ask for financial documents.  PSI will not release any information to me or the office about the amount of his grant or the approval dates.  They will release billing information only.  I will follow up with the [facility] to see if they heard anything from [employer]SI and document billing information as it becomes available.   [place] Patient [place] Phone [phone] Fax [phone] 06/12/2019 4:10 PM

## 2019-06-12 NOTE — Telephone Encounter (Signed)
Called and spoke with patient regarding appointment for 9/4 being rescheduled to 9/3 due to provider being out of office on PAL.  He was ok with new date/time

## 2019-06-12 NOTE — Telephone Encounter (Signed)
Oral Oncology Patient Advocate Encounter  Received notification from [place] [facility] that prior authorization for Sprycel (dasatinib) is required.  PA submitted on CoverMyMeds Key [id number]  Status is pending  Oral Oncology Clinic will continue to follow.   [place] Patient [place] Phone [phone] Fax [phone] 06/12/2019 10:06 AM

## 2019-06-14 NOTE — Telephone Encounter (Signed)
Called and spoke with Lance Hubbard to let him know that PSI is requesting financial documents to verify their income.  I gave him the phone number and fax number to PSI so he can call and ask them income limitations for their program.  I also told them that if PSI does not notify them of approval or denial in the next week or so that we can get a free trial voucher for 30 days of Sprycel to get him started.  PSI ph [phone] PSI fax [name] Patient [place] Phone [phone] Fax [phone] 06/14/2019 1:51 PM

## 2019-06-15 NOTE — Telephone Encounter (Signed)
Spoke with Lance Hubbard this morning.  He emailed his 2019 tax return to PSI yesterday 8/19. They state that they will notify the patient of approval within 3-5 business days by mail.  I told Lance Davidow to call back on Monday to see if they can let him know if he was approved and if so, to get pharmacy billing information.  Once we get that information we can get his medication set up for shipment.    He did ask about the free voucher.  I explained that we like to use those only for emergency situations since it is only good 1 time per person.  If he is approved for assistance we can save the free trial for any insurance issues that may arise.  He understood.

## 2019-06-16 ENCOUNTER — Other Ambulatory Visit: Payer: Self-pay | Admitting: Hematology

## 2019-06-20 MED FILL — SPRYCEL 100 MG TABLET: 100 | 30 days supply | Qty: 30 | Fill #0

## 2019-06-20 NOTE — Telephone Encounter (Signed)
Oral Chemotherapy Pharmacist Encounter   Called Mr. Misiaszek to see if they had a chance to follow-up with PSI about assistance approval, they stated they would call today. I asked them to call me back once they have had a chance to call PSI. I plan to follow back up with them Wednesday afternoon if I have not heard back.  Darl Pikes, PharmD, BCPS, [facility] Hematology/Oncology Clinical Pharmacist [facility] [place] [facility] [phone]  06/20/2019 9:47 AM

## 2019-06-20 NOTE — Telephone Encounter (Signed)
Oral Chemotherapy Pharmacist Encounter  Ms. Distasio called back to let us know Mr. Buckalew was successfully enrolled patient for copayment assistance funds from PSI from the [place].  Award amount: $8,000 ID: [id number] BIN: [id number] Group: [id number] PCN: [id number]  Billing information will be shared with [facility].   Darl Pikes, PharmD, BCPS Hematology/Oncology Clinical Pharmacist [facility] [place] [facility] [phone]  06/20/2019 10:06 AM

## 2019-06-21 ENCOUNTER — Telehealth: Payer: Self-pay | Admitting: Pharmacist

## 2019-06-21 NOTE — Telephone Encounter (Signed)
Oral Chemotherapy Pharmacist Encounter  Patient Education I spoke with Mr. Lance Hubbard and his wife for overview of new oral chemotherapy medication: Sprycel (dasatinib) for the treatment of newly diagnosed CML, planned duration until disease progression or unacceptable drug toxicity.   Counseled patient on administration, dosing, side effects, monitoring, drug-food interactions, safe handling, storage, and disposal. Patient will take 1 tablet (100 mg total) by mouth daily.  Side effects include but not limited to: decreased plt/wbc/hgb, fluid retention, HA, diarrhea.    Given Mr. Lance Hubbard cardiac history, I recommended that he try to weight himself daily to look for fluid retention after start the Sprycel.  Reviewed with patient importance of keeping a medication schedule and plan for any missed doses.  Mr. Lance Hubbard voiced understanding and appreciation.  All questions answered. Medication handout placed in the mail.  Provided patient with [facility] [place] [facility] phone number. Patient knows to call the office with questions or concerns. Oral Chemotherapy Navigation Clinic will continue to follow.  Darl Pikes, PharmD, BCPS, [facility] Hematology/Oncology Clinical Pharmacist [facility] [place] [facility] [phone]  06/21/2019 10:34 AM

## 2019-06-23 ENCOUNTER — Other Ambulatory Visit: Payer: Self-pay | Admitting: Hematology

## 2019-06-29 ENCOUNTER — Inpatient Hospital Stay (HOSPITAL_BASED_OUTPATIENT_CLINIC_OR_DEPARTMENT_OTHER): Payer: Medicare HMO | Admitting: Hematology

## 2019-06-29 ENCOUNTER — Encounter: Payer: Self-pay | Admitting: Hematology

## 2019-06-29 ENCOUNTER — Telehealth: Payer: Self-pay | Admitting: Hematology

## 2019-06-29 ENCOUNTER — Other Ambulatory Visit: Payer: Self-pay

## 2019-06-29 ENCOUNTER — Inpatient Hospital Stay: Payer: Medicare HMO | Attending: Hematology

## 2019-06-29 VITALS — BP 117/49 | HR 65 | Resp 18 | Wt 222.8 lb

## 2019-06-29 DIAGNOSIS — Z7901 Long term (current) use of anticoagulants: Secondary | ICD-10-CM | POA: Insufficient documentation

## 2019-06-29 DIAGNOSIS — D649 Anemia, unspecified: Secondary | ICD-10-CM | POA: Insufficient documentation

## 2019-06-29 DIAGNOSIS — I5020 Unspecified systolic (congestive) heart failure: Secondary | ICD-10-CM | POA: Insufficient documentation

## 2019-06-29 DIAGNOSIS — K5900 Constipation, unspecified: Secondary | ICD-10-CM | POA: Insufficient documentation

## 2019-06-29 DIAGNOSIS — I4891 Unspecified atrial fibrillation: Secondary | ICD-10-CM | POA: Insufficient documentation

## 2019-06-29 DIAGNOSIS — I5032 Chronic diastolic (congestive) heart failure: Secondary | ICD-10-CM | POA: Insufficient documentation

## 2019-06-29 DIAGNOSIS — C9210 Chronic myeloid leukemia, BCR/ABL-positive, not having achieved remission: Secondary | ICD-10-CM | POA: Diagnosis not present

## 2019-06-29 DIAGNOSIS — I5023 Acute on chronic systolic (congestive) heart failure: Secondary | ICD-10-CM | POA: Insufficient documentation

## 2019-06-29 DIAGNOSIS — R001 Bradycardia, unspecified: Secondary | ICD-10-CM | POA: Insufficient documentation

## 2019-06-29 DIAGNOSIS — I5022 Chronic systolic (congestive) heart failure: Secondary | ICD-10-CM | POA: Insufficient documentation

## 2019-06-29 LAB — CBC WITH DIFFERENTIAL (CANCER CENTER ONLY)
Abs Immature Granulocytes: 0.17 10*3/uL — ABNORMAL HIGH (ref 0.00–0.07)
Basophils Absolute: 0.1 10*3/uL (ref 0.0–0.1)
Basophils Relative: 1 %
Eosinophils Absolute: 0.1 10*3/uL (ref 0.0–0.5)
Eosinophils Relative: 1 %
HCT: 38.8 % — ABNORMAL LOW (ref 39.0–52.0)
Hemoglobin: 12.6 g/dL — ABNORMAL LOW (ref 13.0–17.0)
Immature Granulocytes: 1 %
Lymphocytes Relative: 16 %
Lymphs Abs: 3.3 10*3/uL (ref 0.7–4.0)
MCH: 30.1 pg (ref 26.0–34.0)
MCHC: 32.5 g/dL (ref 30.0–36.0)
MCV: 92.6 fL (ref 80.0–100.0)
Monocytes Absolute: 1 10*3/uL (ref 0.1–1.0)
Monocytes Relative: 5 %
Neutro Abs: 15.3 10*3/uL — ABNORMAL HIGH (ref 1.7–7.7)
Neutrophils Relative %: 76 %
Platelet Count: 175 10*3/uL (ref 150–400)
RBC: 4.19 MIL/uL — ABNORMAL LOW (ref 4.22–5.81)
RDW: 14.4 % (ref 11.5–15.5)
WBC Count: 20 10*3/uL — ABNORMAL HIGH (ref 4.0–10.5)
nRBC: 0 % (ref 0.0–0.2)

## 2019-06-29 LAB — CMP (CANCER CENTER ONLY)
ALT: 15 U/L (ref 0–44)
AST: 15 U/L (ref 15–41)
Albumin: 4.1 g/dL (ref 3.5–5.0)
Alkaline Phosphatase: 50 U/L (ref 38–126)
Anion gap: 8 (ref 5–15)
BUN: 14 mg/dL (ref 8–23)
CO2: 27 mmol/L (ref 22–32)
Calcium: 9.3 mg/dL (ref 8.9–10.3)
Chloride: 101 mmol/L (ref 98–111)
Creatinine: 1 mg/dL (ref 0.61–1.24)
GFR, Est AFR Am: >60 mL/min (ref >60)
GFR, Estimated: >60 mL/min (ref >60)
Glucose, Bld: 102 mg/dL — ABNORMAL HIGH (ref 70–99)
Potassium: 3.9 mmol/L (ref 3.5–5.1)
Sodium: 136 mmol/L (ref 135–145)
Total Bilirubin: 0.5 mg/dL (ref 0.3–1.2)
Total Protein: 7.1 g/dL (ref 6.5–8.1)

## 2019-06-29 NOTE — Progress Notes (Signed)
[place] OFFICE PROGRESS NOTE  Patient Care Team: Christain Sacramento, MD as PCP - General (Family Medicine)  HEME/ONC OVERVIEW: 1. 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  -Dasatinib started on 06/21/2019 -Patient is tolerating treatment relatively well so far  -WBC 20k today, stable -Weekly lab monitoring x 2 months, and then monthly thereafter   Normocytic anemia -Most likely secondary to CML and dasatinib -Hgb 12.6, lower than the last visit -Patient denies any symptoms of bleeding -We will monitor it for now   Recent bradycardia -Patient recently noted HR in the 40's; he was also started on carvedilol by his cardiologist around the same time -His cardiologist instructed the patient to contact oncology for evaluation of bradycardia -I reviewed with the patient that tachycardia can be seen with dasatinib, not bradycardia, and his beta blocker was most likely the cause of his bradycardia -I have ordered ECG today to monitor his rhythm and QTc   Atrial fibrillation -S/p cardioversion in 2015 -Currently on Eliquis -He denies any symptoms of bleeding or excess bruising -Continue follow-up with his cardiologist   HFrEF -Recent echocardiogram showed LVEF 40-45% in 05/2019 -No evidence of volume overload on exam today -Continue follow-up with his local cardiologist   All questions were answered. The patient knows to call the clinic with any problems, questions or concerns. No  barriers to learning was detected.  Return in 1 week for labs and clinic follow-up.   Tish Men, MD 06/29/2019 3:27 PM  CHIEF COMPLAINT: "I am doing okay"  INTERVAL HISTORY: Mr. 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  Patient reports that he started dasatinib on 06/20/2019, and has tolerated relatively well so far with no significant side effects.  He was started on carvedilol by his cardiologist around the same time, and was noted to have some bradycardia (heart rate in the 40s) without any lightheadedness or syncope.  Since starting carvedilol, his heart rate has improved back to baseline (50 to 60s).  He has some intermittent constipation, for which he took Dulcolax with one episode of loose stool, but he denies any abdominal pain, nausea, vomiting, hematochezia, or melena.  He denies any constitutional symptoms, chest pain, or dyspnea.  He denies any other complaint today.  SUMMARY OF ONCOLOGIC HISTORY: Oncology History   No history exists.    REVIEW OF SYSTEMS:   Constitutional: ( - ) fevers, ( - )  chills , ( - ) night sweats Eyes: ( - ) blurriness of vision, ( - ) double vision, ( - ) watery eyes Ears, nose, mouth, throat, and face: ( - ) mucositis, ( - ) sore throat Respiratory: ( - ) cough, ( - ) dyspnea, ( - ) wheezes Cardiovascular: ( - ) palpitation, ( - ) chest discomfort, ( - ) lower extremity swelling Gastrointestinal:  ( - ) nausea, ( - ) heartburn, ( + ) change in bowel habits Skin: ( - ) abnormal skin rashes Lymphatics: ( - )  new lymphadenopathy, ( - ) easy bruising Neurological: ( - ) numbness, ( - ) tingling, ( - ) new weaknesses Behavioral/Psych: ( - ) mood change, ( - ) new changes  All other systems were reviewed with the patient and are negative.  I have reviewed the past medical history, past surgical history, social history and family history with the patient and they are unchanged from previous note.  ALLERGIES:  is  allergic to amoxicillin.  MEDICATIONS:  Current Outpatient Medications  Medication Sig Dispense Refill  . acetaminophen (TYLENOL) 500 MG tablet Take 1,500 mg by mouth 2 (two) times a day.    [name] amiodarone (PACERONE) 200 MG tablet     . apixaban (ELIQUIS) 5 MG TABS tablet Take 1 tablet (5 mg total) by mouth every 12 (twelve) hours. 60 tablet 3  . clobetasol (TEMOVATE) 0.05 % external solution Apply 1 application topically 2 (two) times daily as needed (for bug bites).     . dasatinib (SPRYCEL) 100 MG tablet Take 1 tablet (100 mg total) by mouth daily. 30 tablet 11  . losartan-hydrochlorothiazide (HYZAAR) 100-12.5 MG tablet Take 1 tablet by mouth daily.     Vladimir Faster Glycol-Propyl Glycol (SYSTANE ULTRA) 0.4-0.3 % SOLN Place 1-2 drops into both eyes 3 (three) times daily as needed (for dry eyes).     No current facility-administered medications for this visit.     PHYSICAL EXAMINATION: ECOG PERFORMANCE STATUS: 2 - Symptomatic, <50% confined to bed  Today's Vitals   06/29/19 1504  BP: (!) 117/49  Pulse: 65  Resp: 18  SpO2: 99%  Weight: 222 lb 12.8 oz (101.1 kg)  PainSc: 0-No pain   Body mass index is 25.75 kg/m.  Filed Weights   06/29/19 1504  Weight: 222 lb 12.8 oz (101.1 kg)    GENERAL: alert, no distress and comfortable SKIN: skin color, texture, turgor are normal, no rashes or significant lesions EYES: conjunctiva are pink and non-injected, sclera clear OROPHARYNX: no exudate, no erythema; lips, buccal mucosa, and tongue normal  NECK: supple, non-tender LUNGS: clear to auscultation with normal breathing effort HEART: regular rate & rhythm and no murmurs and no lower extremity edema ABDOMEN: soft, non-tender, non-distended, normal bowel sounds Musculoskeletal: no cyanosis of digits and no clubbing  PSYCH: alert & oriented x 3, fluent speech NEURO: no focal motor/sensory deficits  LABORATORY DATA:  I have reviewed the data as listed    Component Value Date/Time   NA  136 06/29/2019 1453   K 3.9 06/29/2019 1453   CL 101 06/29/2019 1453   CO2 27 06/29/2019 1453   GLUCOSE 102 (H) 06/29/2019 1453   BUN 14 06/29/2019 1453   CREATININE 1.00 06/29/2019 1453   CALCIUM 9.3 06/29/2019 1453   PROT 7.1 06/29/2019 1453   ALBUMIN 4.1 06/29/2019 1453   AST 15 06/29/2019 1453   ALT 15 06/29/2019 1453   ALKPHOS 50 06/29/2019 1453   BILITOT 0.5 06/29/2019 1453   GFRNONAA >60 06/29/2019 1453   GFRAA >60 06/29/2019 1453    No results found for: SPEP, UPEP  Lab Results  Component Value Date   WBC 20.0 (H) 06/29/2019   NEUTROABS 15.3 (H) 06/29/2019   HGB 12.6 (L) 06/29/2019   HCT 38.8 (L) 06/29/2019   MCV 92.6 06/29/2019   PLT 175 06/29/2019      Chemistry      Component Value Date/Time   NA 136 06/29/2019 1453   K 3.9 06/29/2019 1453   CL 101 06/29/2019 1453  CO2 27 06/29/2019 1453   BUN 14 06/29/2019 1453   CREATININE 1.00 06/29/2019 1453      Component Value Date/Time   CALCIUM 9.3 06/29/2019 1453   ALKPHOS 50 06/29/2019 1453   AST 15 06/29/2019 1453   ALT 15 06/29/2019 1453   BILITOT 0.5 06/29/2019 1453       RADIOGRAPHIC STUDIES: I have personally reviewed the radiological images as listed below and agreed with the findings in the report. No results found.

## 2019-06-29 NOTE — Telephone Encounter (Signed)
Spoke with patient to confirm 9/10 appt at 1 pm per 9/3 LOS

## 2019-06-30 ENCOUNTER — Ambulatory Visit: Payer: Medicare HMO | Admitting: Hematology

## 2019-06-30 ENCOUNTER — Other Ambulatory Visit: Payer: Medicare HMO

## 2019-06-30 LAB — LACTATE DEHYDROGENASE: LDH: 182 U/L (ref 98–192)

## 2019-07-06 ENCOUNTER — Encounter: Payer: Self-pay | Admitting: Hematology

## 2019-07-06 ENCOUNTER — Other Ambulatory Visit: Payer: Self-pay | Admitting: Hematology

## 2019-07-06 ENCOUNTER — Inpatient Hospital Stay: Payer: Medicare HMO | Admitting: Hematology

## 2019-07-06 ENCOUNTER — Other Ambulatory Visit: Payer: Self-pay

## 2019-07-06 ENCOUNTER — Inpatient Hospital Stay: Payer: Medicare HMO

## 2019-07-06 ENCOUNTER — Telehealth: Payer: Self-pay | Admitting: Hematology

## 2019-07-06 VITALS — BP 124/56 | HR 59 | Temp 97.8°F | Resp 18 | Ht 78.0 in | Wt 227.4 lb

## 2019-07-06 DIAGNOSIS — C9210 Chronic myeloid leukemia, BCR/ABL-positive, not having achieved remission: Secondary | ICD-10-CM | POA: Diagnosis not present

## 2019-07-06 DIAGNOSIS — D649 Anemia, unspecified: Secondary | ICD-10-CM | POA: Diagnosis not present

## 2019-07-06 DIAGNOSIS — I5022 Chronic systolic (congestive) heart failure: Secondary | ICD-10-CM | POA: Diagnosis not present

## 2019-07-06 DIAGNOSIS — I4891 Unspecified atrial fibrillation: Secondary | ICD-10-CM | POA: Diagnosis not present

## 2019-07-06 DIAGNOSIS — K5903 Drug induced constipation: Secondary | ICD-10-CM

## 2019-07-06 LAB — CMP (CANCER CENTER ONLY)
ALT: 28 U/L (ref 0–44)
AST: 21 U/L (ref 15–41)
Albumin: 3.8 g/dL (ref 3.5–5.0)
Alkaline Phosphatase: 58 U/L (ref 38–126)
Anion gap: 7 (ref 5–15)
BUN: 14 mg/dL (ref 8–23)
CO2: 26 mmol/L (ref 22–32)
Calcium: 8.9 mg/dL (ref 8.9–10.3)
Chloride: 103 mmol/L (ref 98–111)
Creatinine: 0.9 mg/dL (ref 0.61–1.24)
GFR, Est AFR Am: >60 mL/min (ref >60)
GFR, Estimated: >60 mL/min (ref >60)
Glucose, Bld: 107 mg/dL — ABNORMAL HIGH (ref 70–99)
Potassium: 4 mmol/L (ref 3.5–5.1)
Sodium: 136 mmol/L (ref 135–145)
Total Bilirubin: 0.5 mg/dL (ref 0.3–1.2)
Total Protein: 6.6 g/dL (ref 6.5–8.1)

## 2019-07-06 LAB — CBC WITH DIFFERENTIAL (CANCER CENTER ONLY)
Abs Immature Granulocytes: 0.03 10*3/uL (ref 0.00–0.07)
Basophils Absolute: 0 10*3/uL (ref 0.0–0.1)
Basophils Relative: 0 %
Eosinophils Absolute: 0.1 10*3/uL (ref 0.0–0.5)
Eosinophils Relative: 1 %
HCT: 33.4 % — ABNORMAL LOW (ref 39.0–52.0)
Hemoglobin: 10.9 g/dL — ABNORMAL LOW (ref 13.0–17.0)
Immature Granulocytes: 0 %
Lymphocytes Relative: 23 %
Lymphs Abs: 2.1 10*3/uL (ref 0.7–4.0)
MCH: 30.7 pg (ref 26.0–34.0)
MCHC: 32.6 g/dL (ref 30.0–36.0)
MCV: 94.1 fL (ref 80.0–100.0)
Monocytes Absolute: 0.5 10*3/uL (ref 0.1–1.0)
Monocytes Relative: 5 %
Neutro Abs: 6.2 10*3/uL (ref 1.7–7.7)
Neutrophils Relative %: 71 %
Platelet Count: 185 10*3/uL (ref 150–400)
RBC: 3.55 MIL/uL — ABNORMAL LOW (ref 4.22–5.81)
RDW: 14.5 % (ref 11.5–15.5)
WBC Count: 8.9 10*3/uL (ref 4.0–10.5)
nRBC: 0 % (ref 0.0–0.2)

## 2019-07-06 NOTE — Telephone Encounter (Signed)
Called and spoke with patient regarding appointments added per 9/10 los

## 2019-07-06 NOTE — Progress Notes (Signed)
[place] OFFICE PROGRESS NOTE  Patient Care Team: Christain Sacramento, MD as PCP - General (Family Medicine)  HEME/ONC OVERVIEW: 1. 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  -Dasatinib started on 06/21/2019 -Patient is tolerating treatment relatively well so far  -WBC 8.9k with ANC ~6200, improving  -Continue dasatinib 100mg  daily  -Weekly lab monitoring x 2 months, and then monthly thereafter   Normocytic anemia -Most likely secondary to CML and dasatinib -Hgb 10.9, lower than the last visit  -Patient denies any symptoms of bleeding -We will monitor it for now   Constipation -Possibly secondary to medication -I counseled the patient on various bowel regimen, including docusate, Miralax and Dulcolax  Atrial fibrillation -S/p cardioversion in 2015 -Currently on Eliquis -He denies any symptoms of bleeding or excess bruising -Continue follow-up with his cardiologist   HFrEF -Recent echocardiogram showed LVEF 40-45% in 05/2019 -No evidence of volume overload on exam today -Continue follow-up with his local cardiologist   No orders of the defined types were placed in this encounter.  All questions were answered. The patient knows to call the clinic with any problems, questions or concerns. No barriers to learning was detected.  Return in 1 week for labs only, and return in 2 weeks for labs and clinic appt.   Tish Men, MD 07/06/2019 2:11 PM  CHIEF COMPLAINT: "I finally got over  the constipation"  INTERVAL HISTORY: Lance Hubbard returns to clinic for follow-up of CML on dasatinib.  Patient reports that starting 2 weeks ago, he started having intermittent constipation.  He tried prunes, Dulcolax, and MiraLAX over the weekend and was finally able to have a good bowel movement.  He also reports that he has had occasional muscle achiness in the left arm, lasting 1 to 3 minutes, and resolving without having to take any medication.  Overall, the muscle achiness is improving.  His night sweats have resolved.  He denies any other complaint today.  SUMMARY OF ONCOLOGIC HISTORY: Oncology History   No history exists.    REVIEW OF SYSTEMS:   Constitutional: ( - ) fevers, ( - )  chills , ( - ) night sweats Eyes: ( - ) blurriness of vision, ( - ) double vision, ( - ) watery eyes Ears, nose, mouth, throat, and face: ( - ) mucositis, ( - ) sore throat Respiratory: ( - ) cough, ( - ) dyspnea, ( - ) wheezes Cardiovascular: ( - ) palpitation, ( - ) chest discomfort, ( - ) lower extremity swelling Gastrointestinal:  ( - ) nausea, ( - ) heartburn, ( + ) change in bowel habits Skin: ( - ) abnormal skin rashes Lymphatics: ( - ) new lymphadenopathy, ( - ) easy bruising Neurological: ( - ) numbness, ( - ) tingling, ( - ) new weaknesses Behavioral/Psych: ( - ) mood change, ( - ) new changes  All other systems were reviewed with the patient and are negative.  I have reviewed the past medical history, past  surgical history, social history and family history with the patient and they are unchanged from previous note.  ALLERGIES:  is allergic to amoxicillin.  MEDICATIONS:  Current Outpatient Medications  Medication Sig Dispense Refill  . acetaminophen (TYLENOL) 500 MG tablet Take 1,500 mg by mouth 2 (two) times a day.    [name] amiodarone (PACERONE) 200 MG tablet     . apixaban (ELIQUIS) 5 MG TABS tablet Take 1 tablet (5 mg total) by mouth every 12 (twelve) hours. 60 tablet 3  . carvedilol  (COREG) 3.125 MG tablet Take 3.125 mg by mouth 2 (two) times daily.    . clobetasol (TEMOVATE) 0.05 % external solution Apply 1 application topically 2 (two) times daily as needed (for bug bites).     . dasatinib (SPRYCEL) 100 MG tablet Take 1 tablet (100 mg total) by mouth daily. 30 tablet 11  . losartan-hydrochlorothiazide (HYZAAR) 100-12.5 MG tablet Take 1 tablet by mouth daily.     Vladimir Faster Glycol-Propyl Glycol (SYSTANE ULTRA) 0.4-0.3 % SOLN Place 1-2 drops into both eyes 3 (three) times daily as needed (for dry eyes).     No current facility-administered medications for this visit.     PHYSICAL EXAMINATION: ECOG PERFORMANCE STATUS: 1 - Symptomatic but completely ambulatory  Today's Vitals   07/06/19 1353  BP: (!) 124/56  Pulse: (!) 59  Resp: 18  Temp: 97.8 F (36.6 C)  TempSrc: Temporal  SpO2: 100%  Weight: 227 lb 6.4 oz (103.1 kg)  Height: 6\' 6"  (1.981 m)  PainSc: 0-No pain   Body mass index is 26.28 kg/m.  Filed Weights   07/06/19 1353  Weight: 227 lb 6.4 oz (103.1 kg)    GENERAL: alert, no distress and comfortable SKIN: skin color, texture, turgor are normal, no rashes or significant lesions EYES: conjunctiva are pink and non-injected, sclera clear OROPHARYNX: no exudate, no erythema; lips, buccal mucosa, and tongue normal  NECK: supple, non-tender LYMPH:  no palpable lymphadenopathy in the cervical LUNGS: clear to auscultation with normal breathing effort HEART: regular rate & rhythm and no murmurs and no lower extremity edema ABDOMEN: soft, non-tender, non-distended, normal bowel sounds Musculoskeletal: no cyanosis of digits and no clubbing  PSYCH: alert & oriented x 3, fluent speech NEURO: no focal motor/sensory deficits  LABORATORY DATA:  I have reviewed the data as listed    Component Value Date/Time   NA 136 06/29/2019 1453   K 3.9 06/29/2019 1453   CL 101 06/29/2019 1453   CO2 27 06/29/2019 1453   GLUCOSE 102 (H) 06/29/2019 1453   BUN 14  06/29/2019 1453   CREATININE 1.00 06/29/2019 1453   CALCIUM 9.3 06/29/2019 1453   PROT 7.1 06/29/2019 1453   ALBUMIN 4.1 06/29/2019 1453   AST 15 06/29/2019 1453   ALT 15 06/29/2019 1453   ALKPHOS 50 06/29/2019 1453   BILITOT 0.5 06/29/2019 1453   GFRNONAA >60 06/29/2019 1453   GFRAA >60 06/29/2019 1453    No results found for: SPEP, UPEP  Lab Results  Component Value Date   WBC 8.9 07/06/2019   NEUTROABS 6.2 07/06/2019   HGB 10.9 (L) 07/06/2019   HCT 33.4 (L) 07/06/2019   MCV 94.1 07/06/2019   PLT 185 07/06/2019      Chemistry      Component Value Date/Time   NA 136 06/29/2019 1453   K 3.9 06/29/2019 1453   CL 101 06/29/2019 1453   CO2 27 06/29/2019 1453   BUN 14 06/29/2019 1453   CREATININE 1.00  06/29/2019 1453      Component Value Date/Time   CALCIUM 9.3 06/29/2019 1453   ALKPHOS 50 06/29/2019 1453   AST 15 06/29/2019 1453   ALT 15 06/29/2019 1453   BILITOT 0.5 06/29/2019 1453       RADIOGRAPHIC STUDIES: I have personally reviewed the radiological images as listed below and agreed with the findings in the report. No results found.

## 2019-07-07 LAB — LACTATE DEHYDROGENASE: LDH: 181 U/L (ref 98–192)

## 2019-07-11 ENCOUNTER — Telehealth: Payer: Self-pay | Admitting: *Deleted

## 2019-07-11 DIAGNOSIS — C9210 Chronic myeloid leukemia, BCR/ABL-positive, not having achieved remission: Secondary | ICD-10-CM

## 2019-07-11 MED ORDER — DASATINIB 100 MG PO TABS: 100.0000 mg | ORAL_TABLET | Freq: Every day | ORAL | 11 refills | Status: DC

## 2019-07-11 NOTE — Telephone Encounter (Signed)
Message received from patient's wife stating that pt has had a rash on his chest for three days, has had swollen and itching nipples x 1 week, swollen feet and ankles yesterday which is now resolved and SOB yesterday, which is now better today.  Call placed back to patient's wife and notified her per order of Dr. Maylon Peppers to use Hydrocortisone OTC cream for rash, Benadryl OTC as directed on label for itching, to call Cardiologist if SOB worsens and to call this office back if rash worsens or begins to drain.  Teach back done.  Pt.'s wife appreciative of call back and has no further questions or concerns at this time.

## 2019-07-13 ENCOUNTER — Other Ambulatory Visit: Payer: Self-pay

## 2019-07-13 ENCOUNTER — Inpatient Hospital Stay: Payer: Medicare HMO

## 2019-07-13 DIAGNOSIS — C9210 Chronic myeloid leukemia, BCR/ABL-positive, not having achieved remission: Secondary | ICD-10-CM | POA: Diagnosis not present

## 2019-07-13 LAB — CBC WITH DIFFERENTIAL (CANCER CENTER ONLY)
Abs Immature Granulocytes: 0.02 10*3/uL (ref 0.00–0.07)
Basophils Absolute: 0 10*3/uL (ref 0.0–0.1)
Basophils Relative: 0 %
Eosinophils Absolute: 0.1 10*3/uL (ref 0.0–0.5)
Eosinophils Relative: 2 %
HCT: 33.7 % — ABNORMAL LOW (ref 39.0–52.0)
Hemoglobin: 10.9 g/dL — ABNORMAL LOW (ref 13.0–17.0)
Immature Granulocytes: 0 %
Lymphocytes Relative: 34 %
Lymphs Abs: 1.7 10*3/uL (ref 0.7–4.0)
MCH: 30.6 pg (ref 26.0–34.0)
MCHC: 32.3 g/dL (ref 30.0–36.0)
MCV: 94.7 fL (ref 80.0–100.0)
Monocytes Absolute: 0.4 10*3/uL (ref 0.1–1.0)
Monocytes Relative: 8 %
Neutro Abs: 2.9 10*3/uL (ref 1.7–7.7)
Neutrophils Relative %: 56 %
Platelet Count: 201 10*3/uL (ref 150–400)
RBC: 3.56 MIL/uL — ABNORMAL LOW (ref 4.22–5.81)
RDW: 14.6 % (ref 11.5–15.5)
WBC Count: 5.1 10*3/uL (ref 4.0–10.5)
nRBC: 0 % (ref 0.0–0.2)

## 2019-07-13 LAB — CMP (CANCER CENTER ONLY)
ALT: 34 U/L (ref 0–44)
AST: 24 U/L (ref 15–41)
Albumin: 4 g/dL (ref 3.5–5.0)
Alkaline Phosphatase: 58 U/L (ref 38–126)
Anion gap: 7 (ref 5–15)
BUN: 12 mg/dL (ref 8–23)
CO2: 29 mmol/L (ref 22–32)
Calcium: 9.4 mg/dL (ref 8.9–10.3)
Chloride: 100 mmol/L (ref 98–111)
Creatinine: 0.98 mg/dL (ref 0.61–1.24)
GFR, Est AFR Am: >60 mL/min (ref >60)
GFR, Estimated: >60 mL/min (ref >60)
Glucose, Bld: 99 mg/dL (ref 70–99)
Potassium: 4.5 mmol/L (ref 3.5–5.1)
Sodium: 136 mmol/L (ref 135–145)
Total Bilirubin: 0.5 mg/dL (ref 0.3–1.2)
Total Protein: 6.7 g/dL (ref 6.5–8.1)

## 2019-07-14 ENCOUNTER — Telehealth: Payer: Self-pay | Admitting: *Deleted

## 2019-07-14 LAB — LACTATE DEHYDROGENASE: LDH: 193 U/L — ABNORMAL HIGH (ref 98–192)

## 2019-07-14 MED FILL — SPRYCEL 100 MG TABLET: 100 | 30 days supply | Qty: 30 | Fill #1

## 2019-07-14 NOTE — Telephone Encounter (Signed)
-----   Message from Tish Men, MD sent at 07/14/2019  8:26 AM EDT ----- HI Lance Hubbard, Can you let Lance Hubbard know that his blood counts look okay, and he should continue dasatinib?Thanks.  Kevin  ----- Message ----- From: Buel Ream, [facility] [place] Sent: 07/13/2019   3:30 PM EDT To: Tish Men, MD

## 2019-07-14 NOTE — Telephone Encounter (Signed)
Notified pt of results and instructed pt to continue Dasatinib Pt verbalized understanding.

## 2019-07-20 ENCOUNTER — Other Ambulatory Visit: Payer: Self-pay

## 2019-07-20 ENCOUNTER — Inpatient Hospital Stay: Payer: Medicare HMO

## 2019-07-20 ENCOUNTER — Encounter: Payer: Self-pay | Admitting: Hematology

## 2019-07-20 ENCOUNTER — Inpatient Hospital Stay (HOSPITAL_BASED_OUTPATIENT_CLINIC_OR_DEPARTMENT_OTHER): Payer: Medicare HMO | Admitting: Hematology

## 2019-07-20 VITALS — BP 129/62 | HR 58 | Temp 97.1°F | Resp 18 | Ht 78.0 in | Wt 226.4 lb

## 2019-07-20 DIAGNOSIS — C9210 Chronic myeloid leukemia, BCR/ABL-positive, not having achieved remission: Secondary | ICD-10-CM

## 2019-07-20 DIAGNOSIS — D649 Anemia, unspecified: Secondary | ICD-10-CM | POA: Diagnosis not present

## 2019-07-20 DIAGNOSIS — K5900 Constipation, unspecified: Secondary | ICD-10-CM | POA: Insufficient documentation

## 2019-07-20 DIAGNOSIS — I4891 Unspecified atrial fibrillation: Secondary | ICD-10-CM | POA: Diagnosis not present

## 2019-07-20 DIAGNOSIS — I5022 Chronic systolic (congestive) heart failure: Secondary | ICD-10-CM

## 2019-07-20 LAB — CBC WITH DIFFERENTIAL (CANCER CENTER ONLY)
Abs Immature Granulocytes: 0.01 10*3/uL (ref 0.00–0.07)
Basophils Absolute: 0 10*3/uL (ref 0.0–0.1)
Basophils Relative: 1 %
Eosinophils Absolute: 0.1 10*3/uL (ref 0.0–0.5)
Eosinophils Relative: 2 %
HCT: 35.5 % — ABNORMAL LOW (ref 39.0–52.0)
Hemoglobin: 11.5 g/dL — ABNORMAL LOW (ref 13.0–17.0)
Immature Granulocytes: 0 %
Lymphocytes Relative: 45 %
Lymphs Abs: 2 10*3/uL (ref 0.7–4.0)
MCH: 30.6 pg (ref 26.0–34.0)
MCHC: 32.4 g/dL (ref 30.0–36.0)
MCV: 94.4 fL (ref 80.0–100.0)
Monocytes Absolute: 0.4 10*3/uL (ref 0.1–1.0)
Monocytes Relative: 10 %
Neutro Abs: 1.8 10*3/uL (ref 1.7–7.7)
Neutrophils Relative %: 42 %
Platelet Count: 192 10*3/uL (ref 150–400)
RBC: 3.76 MIL/uL — ABNORMAL LOW (ref 4.22–5.81)
RDW: 15 % (ref 11.5–15.5)
WBC Count: 4.4 10*3/uL (ref 4.0–10.5)
nRBC: 0 % (ref 0.0–0.2)

## 2019-07-20 LAB — CMP (CANCER CENTER ONLY)
ALT: 36 U/L (ref 0–44)
AST: 24 U/L (ref 15–41)
Albumin: 4.2 g/dL (ref 3.5–5.0)
Alkaline Phosphatase: 64 U/L (ref 38–126)
Anion gap: 7 (ref 5–15)
BUN: 13 mg/dL (ref 8–23)
CO2: 27 mmol/L (ref 22–32)
Calcium: 9.2 mg/dL (ref 8.9–10.3)
Chloride: 101 mmol/L (ref 98–111)
Creatinine: 0.92 mg/dL (ref 0.61–1.24)
GFR, Est AFR Am: >60 mL/min (ref >60)
GFR, Estimated: >60 mL/min (ref >60)
Glucose, Bld: 128 mg/dL — ABNORMAL HIGH (ref 70–99)
Potassium: 4.1 mmol/L (ref 3.5–5.1)
Sodium: 135 mmol/L (ref 135–145)
Total Bilirubin: 0.5 mg/dL (ref 0.3–1.2)
Total Protein: 7.3 g/dL (ref 6.5–8.1)

## 2019-07-20 LAB — LACTATE DEHYDROGENASE: LDH: 184 U/L (ref 98–192)

## 2019-07-20 NOTE — Progress Notes (Signed)
[place] OFFICE PROGRESS NOTE  Patient Care Team: Christain Sacramento, MD as PCP - General (Family Medicine)  HEME/ONC OVERVIEW: 1. 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  -Dasatinib started on 06/21/2019 -Patient is tolerating treatment relatively well so far  -WBC 4.4k with ANC 1800, slightly lower than the last visit  -Continue dasatinib 100mg  daily  -Weekly lab monitoring x 2 months, and then monthly thereafter   Normocytic anemia -Most likely secondary to CML and dasatinib -Hgb 11.5, stable  -Patient denies any symptoms of bleeding -We will monitor it for now   Constipation -Currently on Miralax with modest improvement; still having intermittent flatulence that causes some discomfort  -I recommended the patient to add Dulcolax and increase dietary fiber intake  Atrial fibrillation -S/p cardioversion in 2015 -Currently on Eliquis -He denies any symptoms of bleeding or excess bruising -Continue follow-up with his cardiologist   HFrEF -Recent echocardiogram showed LVEF 40-45% in 05/2019 -No evidence of volume overload on exam today -Continue follow-up with his local cardiologist   No orders of the defined types were placed in this encounter.  All questions were answered. The patient knows to call the clinic with any problems, questions or concerns. No barriers to learning was detected.  Return in 2 weeks for labs only. Return in 4 weeks for labs and clinic appt.   Tish Men, MD 07/20/2019 11:24 AM  CHIEF COMPLAINT: "I am just passing a lot of gas"  INTERVAL HISTORY: Lance Hubbard returns to clinic for follow-up of CML on dasatinib.  Patient reports that he has been having periodic flatulence, which he attributes to dasatinib.  He still has moderately persistent constipation despite taking MiraLAX.  He had a small area of pruritic rash over the left nipple and on the left flank, which resolved after he started taking doxycycline for history of rosacea.  He also reports trace swelling in the bilateral ankles at the end of the day, but it resolves by the morning without any intervention.  He denies any other complaint today.  REVIEW OF SYSTEMS:   Constitutional: ( - ) fevers, ( - )  chills , ( - ) night sweats Eyes: ( - ) blurriness of vision, ( - ) double vision, ( - ) watery eyes Ears, nose, mouth, throat, and face: ( - ) mucositis, ( - ) sore throat Respiratory: ( - ) cough, ( - ) dyspnea, ( - ) wheezes Cardiovascular: ( - ) palpitation, ( - ) chest discomfort, ( - ) lower extremity swelling Gastrointestinal:  ( - ) nausea, ( - ) heartburn, ( + ) change in bowel habits Skin: ( - ) abnormal skin rashes Lymphatics: ( - ) new lymphadenopathy, ( - ) easy bruising Neurological: ( - ) numbness, ( - ) tingling, ( - ) new weaknesses Behavioral/Psych: ( - ) mood change, ( - ) new changes  All other systems were reviewed with the patient and are negative.  I have reviewed the past  medical history, past surgical history, social history and family history with the patient and they are unchanged from previous note.  ALLERGIES:  is allergic to amoxicillin.  MEDICATIONS:  Current Outpatient Medications  Medication Sig Dispense Refill  . acetaminophen (TYLENOL) 500 MG tablet Take 1,500 mg by mouth 2 (two) times a day.    [name] amiodarone (PACERONE) 200 MG tablet     . apixaban (ELIQUIS) 5 MG TABS tablet Take 1 tablet (5 mg total) by mouth every 12 (twelve) hours. 60 tablet  3  . carvedilol (COREG) 3.125 MG tablet Take 3.125 mg by mouth 2 (two) times daily.    . clobetasol (TEMOVATE) 0.05 % external solution Apply 1 application topically 2 (two) times daily as needed (for bug bites).     . dasatinib (SPRYCEL) 100 MG tablet Take 1 tablet (100 mg total) by mouth daily. 30 tablet 11  . losartan-hydrochlorothiazide (HYZAAR) 100-12.5 MG tablet Take 1 tablet by mouth daily.     Vladimir Faster Glycol-Propyl Glycol (SYSTANE ULTRA) 0.4-0.3 % SOLN Place 1-2 drops into both eyes 3 (three) times daily as needed (for dry eyes).     No current facility-administered medications for this visit.     PHYSICAL EXAMINATION: ECOG PERFORMANCE STATUS: 1 - Symptomatic but completely ambulatory  Today's Vitals   07/20/19 1109  BP: 129/62  Pulse: (!) 58  Resp: 18  Temp: (!) 97.1 F (36.2 C)  TempSrc: Temporal  SpO2: 100%  Weight: 226 lb 6.4 oz (102.7 kg)  Height: 6\' 6"  (1.981 m)  PainSc: 0-No pain   Body mass index is 26.16 kg/m.  Filed Weights   07/20/19 1109  Weight: 226 lb 6.4 oz (102.7 kg)    GENERAL: alert, no distress and comfortable SKIN: skin color, texture, turgor are normal, no rashes or significant lesions EYES: conjunctiva are pink and non-injected, sclera clear OROPHARYNX: no exudate, no erythema; lips, buccal mucosa, and tongue normal  NECK: supple, non-tender LUNGS: clear to auscultation with normal breathing effort HEART: regular rate & rhythm and no murmurs and no lower extremity edema ABDOMEN: soft, non-tender, non-distended, normal bowel sounds Musculoskeletal: no cyanosis of digits and no clubbing  PSYCH: alert & oriented x 3, fluent speech NEURO: no focal motor/sensory deficits  LABORATORY DATA:  I have reviewed the data as listed    Component Value Date/Time   NA 136 07/13/2019 1518   K 4.5 07/13/2019 1518   CL 100 07/13/2019 1518   CO2 29 07/13/2019 1518   GLUCOSE 99 07/13/2019 1518   BUN 12 07/13/2019 1518   CREATININE 0.98 07/13/2019  1518   CALCIUM 9.4 07/13/2019 1518   PROT 6.7 07/13/2019 1518   ALBUMIN 4.0 07/13/2019 1518   AST 24 07/13/2019 1518   ALT 34 07/13/2019 1518   ALKPHOS 58 07/13/2019 1518   BILITOT 0.5 07/13/2019 1518   GFRNONAA >60 07/13/2019 1518   GFRAA >60 07/13/2019 1518    No results found for: SPEP, UPEP  Lab Results  Component Value Date   WBC 4.4 07/20/2019   NEUTROABS 1.8 07/20/2019   HGB 11.5 (L) 07/20/2019   HCT 35.5 (L) 07/20/2019   MCV 94.4 07/20/2019   PLT 192 07/20/2019      Chemistry      Component Value Date/Time   NA 136 07/13/2019 1518   K 4.5 07/13/2019 1518   CL 100 07/13/2019 1518   CO2 29 07/13/2019 1518   BUN 12 07/13/2019 1518   CREATININE 0.98 07/13/2019 1518  Component Value Date/Time   CALCIUM 9.4 07/13/2019 1518   ALKPHOS 58 07/13/2019 1518   AST 24 07/13/2019 1518   ALT 34 07/13/2019 1518   BILITOT 0.5 07/13/2019 1518       RADIOGRAPHIC STUDIES: I have personally reviewed the radiological images as listed below and agreed with the findings in the report. No results found.

## 2019-08-03 ENCOUNTER — Inpatient Hospital Stay: Payer: Medicare HMO | Attending: Hematology

## 2019-08-03 ENCOUNTER — Other Ambulatory Visit: Payer: Self-pay

## 2019-08-03 DIAGNOSIS — Z7901 Long term (current) use of anticoagulants: Secondary | ICD-10-CM | POA: Diagnosis not present

## 2019-08-03 DIAGNOSIS — I4891 Unspecified atrial fibrillation: Secondary | ICD-10-CM | POA: Diagnosis not present

## 2019-08-03 DIAGNOSIS — C9210 Chronic myeloid leukemia, BCR/ABL-positive, not having achieved remission: Secondary | ICD-10-CM | POA: Diagnosis not present

## 2019-08-03 DIAGNOSIS — R0609 Other forms of dyspnea: Secondary | ICD-10-CM | POA: Insufficient documentation

## 2019-08-03 DIAGNOSIS — D649 Anemia, unspecified: Secondary | ICD-10-CM | POA: Insufficient documentation

## 2019-08-03 LAB — CBC WITH DIFFERENTIAL (CANCER CENTER ONLY)
Abs Immature Granulocytes: 0.01 10*3/uL (ref 0.00–0.07)
Basophils Absolute: 0 10*3/uL (ref 0.0–0.1)
Basophils Relative: 1 %
Eosinophils Absolute: 0.1 10*3/uL (ref 0.0–0.5)
Eosinophils Relative: 1 %
HCT: 36.8 % — ABNORMAL LOW (ref 39.0–52.0)
Hemoglobin: 12 g/dL — ABNORMAL LOW (ref 13.0–17.0)
Immature Granulocytes: 0 %
Lymphocytes Relative: 47 %
Lymphs Abs: 3.1 10*3/uL (ref 0.7–4.0)
MCH: 30.6 pg (ref 26.0–34.0)
MCHC: 32.6 g/dL (ref 30.0–36.0)
MCV: 93.9 fL (ref 80.0–100.0)
Monocytes Absolute: 0.5 10*3/uL (ref 0.1–1.0)
Monocytes Relative: 7 %
Neutro Abs: 2.8 10*3/uL (ref 1.7–7.7)
Neutrophils Relative %: 44 %
Platelet Count: 165 10*3/uL (ref 150–400)
RBC: 3.92 MIL/uL — ABNORMAL LOW (ref 4.22–5.81)
RDW: 15 % (ref 11.5–15.5)
WBC Count: 6.4 10*3/uL (ref 4.0–10.5)
nRBC: 0 % (ref 0.0–0.2)

## 2019-08-03 LAB — CMP (CANCER CENTER ONLY)
ALT: 26 U/L (ref 0–44)
AST: 19 U/L (ref 15–41)
Albumin: 4.2 g/dL (ref 3.5–5.0)
Alkaline Phosphatase: 63 U/L (ref 38–126)
Anion gap: 8 (ref 5–15)
BUN: 15 mg/dL (ref 8–23)
CO2: 26 mmol/L (ref 22–32)
Calcium: 8.9 mg/dL (ref 8.9–10.3)
Chloride: 101 mmol/L (ref 98–111)
Creatinine: 1.01 mg/dL (ref 0.61–1.24)
GFR, Est AFR Am: >60 mL/min (ref >60)
GFR, Estimated: >60 mL/min (ref >60)
Glucose, Bld: 133 mg/dL — ABNORMAL HIGH (ref 70–99)
Potassium: 3.9 mmol/L (ref 3.5–5.1)
Sodium: 135 mmol/L (ref 135–145)
Total Bilirubin: 0.5 mg/dL (ref 0.3–1.2)
Total Protein: 7 g/dL (ref 6.5–8.1)

## 2019-08-03 LAB — LACTATE DEHYDROGENASE: LDH: 195 U/L — ABNORMAL HIGH (ref 98–192)

## 2019-08-08 ENCOUNTER — Ambulatory Visit: Payer: Medicare HMO | Admitting: Hematology

## 2019-08-08 ENCOUNTER — Other Ambulatory Visit: Payer: Medicare HMO

## 2019-08-16 MED FILL — SPRYCEL 100 MG TABLET: 100 | 30 days supply | Qty: 30 | Fill #2

## 2019-08-17 ENCOUNTER — Inpatient Hospital Stay (HOSPITAL_BASED_OUTPATIENT_CLINIC_OR_DEPARTMENT_OTHER): Payer: Medicare HMO | Admitting: Hematology

## 2019-08-17 ENCOUNTER — Inpatient Hospital Stay: Payer: Medicare HMO

## 2019-08-17 ENCOUNTER — Ambulatory Visit (HOSPITAL_BASED_OUTPATIENT_CLINIC_OR_DEPARTMENT_OTHER)
Admission: RE | Admit: 2019-08-17 | Discharge: 2019-08-17 | Disposition: A | Discharge status: Home / Self Care | Payer: Medicare HMO | Source: Ambulatory Visit | Attending: Hematology | Admitting: Hematology

## 2019-08-17 ENCOUNTER — Other Ambulatory Visit: Payer: Self-pay | Admitting: Hematology

## 2019-08-17 ENCOUNTER — Encounter: Payer: Self-pay | Admitting: Hematology

## 2019-08-17 ENCOUNTER — Other Ambulatory Visit: Payer: Self-pay

## 2019-08-17 VITALS — BP 138/71 | HR 59 | Temp 97.5°F | Resp 21 | Wt 225.4 lb

## 2019-08-17 DIAGNOSIS — C9210 Chronic myeloid leukemia, BCR/ABL-positive, not having achieved remission: Secondary | ICD-10-CM | POA: Diagnosis not present

## 2019-08-17 DIAGNOSIS — D649 Anemia, unspecified: Secondary | ICD-10-CM

## 2019-08-17 DIAGNOSIS — R0600 Dyspnea, unspecified: Secondary | ICD-10-CM | POA: Diagnosis not present

## 2019-08-17 DIAGNOSIS — R0609 Other forms of dyspnea: Secondary | ICD-10-CM

## 2019-08-17 DIAGNOSIS — I5020 Unspecified systolic (congestive) heart failure: Secondary | ICD-10-CM

## 2019-08-17 DIAGNOSIS — I4891 Unspecified atrial fibrillation: Secondary | ICD-10-CM

## 2019-08-17 LAB — CMP (CANCER CENTER ONLY)
ALT: 30 U/L (ref 0–44)
AST: 26 U/L (ref 15–41)
Albumin: 4.6 g/dL (ref 3.5–5.0)
Alkaline Phosphatase: 64 U/L (ref 38–126)
Anion gap: 8 (ref 5–15)
BUN: 13 mg/dL (ref 8–23)
CO2: 28 mmol/L (ref 22–32)
Calcium: 9.4 mg/dL (ref 8.9–10.3)
Chloride: 98 mmol/L (ref 98–111)
Creatinine: 1.01 mg/dL (ref 0.61–1.24)
GFR, Est AFR Am: >60 mL/min (ref >60)
GFR, Estimated: >60 mL/min (ref >60)
Glucose, Bld: 115 mg/dL — ABNORMAL HIGH (ref 70–99)
Potassium: 4.2 mmol/L (ref 3.5–5.1)
Sodium: 134 mmol/L — ABNORMAL LOW (ref 135–145)
Total Bilirubin: 0.4 mg/dL (ref 0.3–1.2)
Total Protein: 7.2 g/dL (ref 6.5–8.1)

## 2019-08-17 LAB — CBC WITH DIFFERENTIAL (CANCER CENTER ONLY)
Abs Immature Granulocytes: 0.02 10*3/uL (ref 0.00–0.07)
Basophils Absolute: 0 10*3/uL (ref 0.0–0.1)
Basophils Relative: 1 %
Eosinophils Absolute: 0.1 10*3/uL (ref 0.0–0.5)
Eosinophils Relative: 1 %
HCT: 37.9 % — ABNORMAL LOW (ref 39.0–52.0)
Hemoglobin: 12.4 g/dL — ABNORMAL LOW (ref 13.0–17.0)
Immature Granulocytes: 0 %
Lymphocytes Relative: 41 %
Lymphs Abs: 2.7 10*3/uL (ref 0.7–4.0)
MCH: 30.4 pg (ref 26.0–34.0)
MCHC: 32.7 g/dL (ref 30.0–36.0)
MCV: 92.9 fL (ref 80.0–100.0)
Monocytes Absolute: 0.6 10*3/uL (ref 0.1–1.0)
Monocytes Relative: 9 %
Neutro Abs: 3.1 10*3/uL (ref 1.7–7.7)
Neutrophils Relative %: 48 %
Platelet Count: 207 10*3/uL (ref 150–400)
RBC: 4.08 MIL/uL — ABNORMAL LOW (ref 4.22–5.81)
RDW: 14.6 % (ref 11.5–15.5)
WBC Count: 6.5 10*3/uL (ref 4.0–10.5)
nRBC: 0 % (ref 0.0–0.2)

## 2019-08-17 LAB — LACTATE DEHYDROGENASE: LDH: 230 U/L — ABNORMAL HIGH (ref 98–192)

## 2019-08-17 MED ORDER — FUROSEMIDE 40 MG PO TABS: 40.0000 mg | ORAL_TABLET | Freq: Every day | ORAL | 3 refills | Status: DC | PRN

## 2019-08-17 NOTE — Progress Notes (Signed)
[place] OFFICE PROGRESS NOTE  Patient Care Team: Christain Sacramento, MD as PCP - General (Family Medicine)  HEME/ONC OVERVIEW: 1. 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  -Dasatinib started on 06/21/2019 -Patient is tolerating treatment relatively well so far  -WBC 6.5k with ANC 3300, stable  -Continue dasatinib 100mg  daily  -As he has been on dasatinib for 2 months and does not have significant side effects, we can monitor his labs monthly for any signs of toxicity -q34months BCR/ABL PCR, next in the end of 08/2019   Exertional dyspnea -Patient reports some baseline exertional dyspnea, but since starting dasatinib, he feels slight worsening of exertional dyspnea -On exam, there is no evidence of volume overload  -I have ordered CXR to rule out pleural effusion -I also counseled the patient on monitoring his fluid and Na intake, due to his baseline HFrEF  -If CXR is unremarkable, I encouraged the patient to contact his cardiologist for further evaluation   Normocytic anemia -Most likely secondary to CML and dasatinib -Hgb 12.4, improving -Patient denies any symptoms of bleeding -We will monitor it for now   Atrial fibrillation -S/p cardioversion in 2015 -Currently on Eliquis -He denies any symptoms of bleeding or excess bruising -Continue follow-up with his cardiologist   HFrEF -Recent echocardiogram showed LVEF 40-45% in 05/2019 -No evidence of volume overload on exam  today -CXR as above to rule out pleural effusion  -Continue follow-up with his local cardiologist   Orders Placed This Encounter  Procedures  . DG Chest 2 View    Standing Status:   Future    Standing Expiration Date:   08/16/2020    Order Specific Question:   Reason for Exam (SYMPTOM  OR DIAGNOSIS REQUIRED)    Answer:   Dyspnea, hx of heart failure    Order Specific Question:   Preferred imaging location?    Answer:   [facility] Specific Question:   Radiology Contrast Protocol - do NOT remove file path    Answer:   [email or web address]  . BCR-ABL    Standing Status:   Future    Standing Expiration Date:   08/16/2020   All questions were answered. The patient knows to call the clinic with any problems, questions or concerns. No barriers to learning was detected.  Return in 1 month for labs and clinic follow-up.   Tish Men, MD 08/17/2019 10:46 AM  CHIEF COMPLAINT: "I am a little more short of breath"  INTERVAL HISTORY: Lance Hubbard returns to clinic for follow-up of chronic phase CML on dasatinib.  Patient reports that he has some baseline shortness of breath with exertion for many years, but since starting dasatinib, he feels that the exertional dyspnea has slightly worsened.  He gets more short of breath walking up a hill or with strenuous exertion at home.  He denies any significant shortness of breath at rest, orthopnea, or PND.  He denies any associated chest pain, palpitation, or significant  lower extremity swelling.  His bowel movement has become more regular on stool softener (Colace).  He denies any other complaint today.  REVIEW OF SYSTEMS:   Constitutional: ( - ) fevers, ( - )  chills , ( - ) night sweats Eyes: ( - ) blurriness of vision, ( - ) double vision, ( - ) watery eyes Ears, nose, mouth, throat, and face: ( - ) mucositis, ( - ) sore throat Respiratory: ( - ) cough, ( + ) dyspnea, ( - )  wheezes Cardiovascular: ( - ) palpitation, ( - ) chest discomfort, ( - ) lower extremity swelling Gastrointestinal:  ( - ) nausea, ( - ) heartburn, ( - ) change in bowel habits Skin: ( - ) abnormal skin rashes Lymphatics: ( - ) new lymphadenopathy, ( - ) easy bruising Neurological: ( - ) numbness, ( - ) tingling, ( - ) new weaknesses Behavioral/Psych: ( - ) mood change, ( - ) new changes  All other systems were reviewed with the patient and are negative.  SUMMARY OF ONCOLOGIC HISTORY: Oncology History   No history exists.    I have reviewed the past medical history, past surgical history, social history and family history with the patient and they are unchanged from previous note.  ALLERGIES:  is allergic to amoxicillin.  MEDICATIONS:  Current Outpatient Medications  Medication Sig Dispense Refill  . acetaminophen (TYLENOL) 500 MG tablet Take 1,500 mg by mouth 2 (two) times a day.    [name] amiodarone (PACERONE) 200 MG tablet     . apixaban (ELIQUIS) 5 MG TABS tablet Take 1 tablet (5 mg total) by mouth every 12 (twelve) hours. 60 tablet 3  . carvedilol (COREG) 3.125 MG tablet Take 3.125 mg by mouth 2 (two) times daily.    . clobetasol (TEMOVATE) 0.05 % external solution Apply 1 application topically 2 (two) times daily as needed (for bug bites).     [name] losartan-hydrochlorothiazide (HYZAAR) 100-12.5 MG tablet Take 1 tablet by mouth daily.     Vladimir Faster Glycol-Propyl Glycol (SYSTANE ULTRA) 0.4-0.3 % SOLN Place 1-2 drops into both eyes 3 (three) times daily as needed (for dry eyes).     No current facility-administered medications for this visit.     PHYSICAL EXAMINATION: ECOG PERFORMANCE STATUS: 2 - Symptomatic, <50% confined to bed  Today's Vitals   08/17/19 1016 08/17/19 1017  BP: 138/71   Pulse: (!) 59   Resp: (!) 21   Temp: (!) 97.5 F (36.4 C)   TempSrc: Temporal   SpO2: 100%   Weight: 225 lb 6.4 oz (102.2 kg)   PainSc:  0-No pain   Body mass index is 26.05 kg/m.   Filed Weights   08/17/19 1016  Weight: 225 lb 6.4 oz (102.2 kg)    GENERAL: alert, no distress and comfortable SKIN: skin color, texture, turgor are normal, no rashes or significant lesions EYES: conjunctiva are pink and non-injected, sclera clear OROPHARYNX: no exudate, no erythema; lips, buccal mucosa, and tongue normal  NECK: supple, non-tender LUNGS: clear to auscultation with normal breathing effort HEART: regular rate & rhythm and no murmurs and no lower extremity edema ABDOMEN: soft, non-tender, non-distended, normal bowel sounds Musculoskeletal: no cyanosis of digits and no clubbing  PSYCH: alert & oriented x 3, fluent speech NEURO: no focal motor/sensory deficits  LABORATORY DATA:  I have reviewed the data as listed    Component Value Date/Time   NA 134 (L) 08/17/2019 0959   K 4.2 08/17/2019 0959  CL 98 08/17/2019 0959   CO2 28 08/17/2019 0959   GLUCOSE 115 (H) 08/17/2019 0959   BUN 13 08/17/2019 0959   CREATININE 1.01 08/17/2019 0959   CALCIUM 9.4 08/17/2019 0959   PROT 7.2 08/17/2019 0959   ALBUMIN 4.6 08/17/2019 0959   AST 26 08/17/2019 0959   ALT 30 08/17/2019 0959   ALKPHOS 64 08/17/2019 0959   BILITOT 0.4 08/17/2019 0959   GFRNONAA >60 08/17/2019 0959   GFRAA >60 08/17/2019 0959    No results found for: SPEP, UPEP  Lab Results  Component Value Date   WBC 6.5 08/17/2019   NEUTROABS 3.1 08/17/2019   HGB 12.4 (L) 08/17/2019   HCT 37.9 (L) 08/17/2019   MCV 92.9 08/17/2019   PLT 207 08/17/2019      Chemistry      Component Value Date/Time   NA 134 (L) 08/17/2019 0959   K 4.2 08/17/2019 0959   CL 98 08/17/2019 0959   CO2 28 08/17/2019 0959   BUN 13 08/17/2019 0959   CREATININE 1.01 08/17/2019 0959      Component Value Date/Time   CALCIUM 9.4 08/17/2019 0959   ALKPHOS 64 08/17/2019 0959   AST 26 08/17/2019 0959   ALT 30 08/17/2019 0959   BILITOT 0.4 08/17/2019 0959       RADIOGRAPHIC STUDIES: I have personally reviewed the  radiological images as listed below and agreed with the findings in the report. No results found.

## 2019-08-18 ENCOUNTER — Telehealth: Payer: Self-pay

## 2019-08-18 NOTE — Telephone Encounter (Addendum)
Attached message given to pt via phone who verbalizes understanding using teach back and appreciation. Patient reports taking Lasix yesterday and is already feeling better. Will contact cardiology. dph   ----- Message from Tish Men, MD sent at 08/17/2019  3:50 PM EDT ----- Clydie Braun,  Can you let the patient know that his CXR showed a small effusion, and I have prescribed his Lasix daily as needed for shortness of breath? He should also contact his cardiologist for further evaluation.   Thanks.  [place]  ----- Message ----- From: Interface, Rad Results In Sent: 08/17/2019   2:24 PM EDT To: Tish Men, MD

## 2019-08-29 ENCOUNTER — Telehealth: Payer: Self-pay

## 2019-08-29 NOTE — Telephone Encounter (Signed)
Received call from pt's wife reporting that pt is experiencing dizziness when standing and has lost 3lbs since Sunday. Pt has been taking Lasix daily since 10/23.   Per Dr Maylon Peppers, pt to stop Lasix. Questioned if pt has gotten in touch with cardiologist as instructed. Per Ms. Janann Colonel, pt has appt with cardiology next week. She may call to see if he can be seen sooner. Educated Ms. Schueneman to call us back if sx do not improve before his cardiology appt. She expresses understanding using teach back. dph

## 2019-09-01 ENCOUNTER — Telehealth: Payer: Self-pay | Admitting: *Deleted

## 2019-09-01 DIAGNOSIS — R0600 Dyspnea, unspecified: Secondary | ICD-10-CM

## 2019-09-01 DIAGNOSIS — R0609 Other forms of dyspnea: Secondary | ICD-10-CM

## 2019-09-01 MED ORDER — FUROSEMIDE 40 MG PO TABS: 20.0000 mg | ORAL_TABLET | Freq: Every day | ORAL | 3 refills | Status: DC

## 2019-09-01 MED ORDER — ENTRESTO 49-51 MG PO TABS: 1.0000 | ORAL_TABLET | Freq: Two times a day (BID) | ORAL | 0 refills | Status: DC

## 2019-09-01 NOTE — Telephone Encounter (Signed)
Call placed back to patient's wife, Lelon Frohlich and instructed her per [place] [facility], that it is ok to take Entresto with Sprycel.  Pt.'s wife states that Dr. Terrence Dupont has decreased pt.'s dose of Lasix to 20 mg daily and that Entresto dose is 49mg /51mg  BID.  Medications changed on pt.'s med list.  Pt.'s wife appreciative of call and has no further questions or concerns at this time.

## 2019-09-01 NOTE — Telephone Encounter (Signed)
Call received from patient's wife, Lance Hubbard stating that pt.'s cardiologist, Dr. Terrence Dupont has discontinued Losartan and Carvedilol and started him on Entresto and would like to know if it is ok to take [facility] with Sprycel.  Pt.s wife also requests that most recent labs, CXR and EKG results be sent to Dr. Zenia Resides office.  Message sent to [facility] Pharmacist and ok for [facility] and Sprycel to be given together.

## 2019-09-15 MED FILL — SPRYCEL 100 MG TABLET: 100 | 30 days supply | Qty: 30 | Fill #3

## 2019-09-19 ENCOUNTER — Other Ambulatory Visit: Payer: Self-pay

## 2019-09-19 ENCOUNTER — Ambulatory Visit (HOSPITAL_BASED_OUTPATIENT_CLINIC_OR_DEPARTMENT_OTHER)
Admission: RE | Admit: 2019-09-19 | Discharge: 2019-09-19 | Disposition: A | Discharge status: Home / Self Care | Payer: Medicare HMO | Source: Ambulatory Visit | Attending: Hematology | Admitting: Hematology

## 2019-09-19 ENCOUNTER — Inpatient Hospital Stay: Payer: Medicare HMO | Attending: Hematology

## 2019-09-19 ENCOUNTER — Inpatient Hospital Stay (HOSPITAL_BASED_OUTPATIENT_CLINIC_OR_DEPARTMENT_OTHER): Payer: Medicare HMO | Admitting: Hematology

## 2019-09-19 ENCOUNTER — Encounter: Payer: Self-pay | Admitting: Hematology

## 2019-09-19 DIAGNOSIS — I4891 Unspecified atrial fibrillation: Secondary | ICD-10-CM | POA: Insufficient documentation

## 2019-09-19 DIAGNOSIS — I5020 Unspecified systolic (congestive) heart failure: Secondary | ICD-10-CM

## 2019-09-19 DIAGNOSIS — R0609 Other forms of dyspnea: Secondary | ICD-10-CM | POA: Insufficient documentation

## 2019-09-19 DIAGNOSIS — D649 Anemia, unspecified: Secondary | ICD-10-CM

## 2019-09-19 DIAGNOSIS — Z7901 Long term (current) use of anticoagulants: Secondary | ICD-10-CM | POA: Insufficient documentation

## 2019-09-19 DIAGNOSIS — R0600 Dyspnea, unspecified: Secondary | ICD-10-CM | POA: Diagnosis not present

## 2019-09-19 DIAGNOSIS — J90 Pleural effusion, not elsewhere classified: Secondary | ICD-10-CM | POA: Insufficient documentation

## 2019-09-19 DIAGNOSIS — C9210 Chronic myeloid leukemia, BCR/ABL-positive, not having achieved remission: Secondary | ICD-10-CM

## 2019-09-19 DIAGNOSIS — E871 Hypo-osmolality and hyponatremia: Secondary | ICD-10-CM | POA: Diagnosis not present

## 2019-09-19 LAB — CBC WITH DIFFERENTIAL (CANCER CENTER ONLY)
Abs Immature Granulocytes: 0.02 10*3/uL (ref 0.00–0.07)
Basophils Absolute: 0 10*3/uL (ref 0.0–0.1)
Basophils Relative: 1 %
Eosinophils Absolute: 0.1 10*3/uL (ref 0.0–0.5)
Eosinophils Relative: 2 %
HCT: 37 % — ABNORMAL LOW (ref 39.0–52.0)
Hemoglobin: 12.3 g/dL — ABNORMAL LOW (ref 13.0–17.0)
Immature Granulocytes: 0 %
Lymphocytes Relative: 46 %
Lymphs Abs: 2.9 10*3/uL (ref 0.7–4.0)
MCH: 29.3 pg (ref 26.0–34.0)
MCHC: 33.2 g/dL (ref 30.0–36.0)
MCV: 88.1 fL (ref 80.0–100.0)
Monocytes Absolute: 0.6 10*3/uL (ref 0.1–1.0)
Monocytes Relative: 9 %
Neutro Abs: 2.6 10*3/uL (ref 1.7–7.7)
Neutrophils Relative %: 42 %
Platelet Count: 191 10*3/uL (ref 150–400)
RBC: 4.2 MIL/uL — ABNORMAL LOW (ref 4.22–5.81)
RDW: 14.5 % (ref 11.5–15.5)
WBC Count: 6.3 10*3/uL (ref 4.0–10.5)
nRBC: 0 % (ref 0.0–0.2)

## 2019-09-19 LAB — CMP (CANCER CENTER ONLY)
ALT: 34 U/L (ref 0–44)
AST: 28 U/L (ref 15–41)
Albumin: 4.5 g/dL (ref 3.5–5.0)
Alkaline Phosphatase: 66 U/L (ref 38–126)
Anion gap: 8 (ref 5–15)
BUN: 14 mg/dL (ref 8–23)
CO2: 27 mmol/L (ref 22–32)
Calcium: 9.1 mg/dL (ref 8.9–10.3)
Chloride: 95 mmol/L — ABNORMAL LOW (ref 98–111)
Creatinine: 1.04 mg/dL (ref 0.61–1.24)
GFR, Est AFR Am: >60 mL/min
GFR, Estimated: >60 mL/min
Glucose, Bld: 108 mg/dL — ABNORMAL HIGH (ref 70–99)
Potassium: 3.9 mmol/L (ref 3.5–5.1)
Sodium: 130 mmol/L — ABNORMAL LOW (ref 135–145)
Total Bilirubin: 0.7 mg/dL (ref 0.3–1.2)
Total Protein: 7.5 g/dL (ref 6.5–8.1)

## 2019-09-19 LAB — LACTATE DEHYDROGENASE: LDH: 229 U/L — ABNORMAL HIGH (ref 98–192)

## 2019-09-19 NOTE — Progress Notes (Addendum)
[facility] OFFICE PROGRESS NOTE  Patient Care Team: Christain Sacramento, MD as PCP - General (Family Medicine)  HEME/ONC OVERVIEW: 1. 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 changing the medication, such as nilotinib   -q3mo29monthR/ABL PCR, next in the end of 11/2019   Exertional dyspnea -Likely due to progressive volume overload; significantly worse since the last visit  -LVEF 40-45% in 05/2019; repeat LVEF 60-65% in 08/2019, questionable mild PAH -S/p therapeutic thoracentesis in late 08/2019  -Dasatinib dose reduction as above  -Due to the possible side effect of cough, I have changed his Lasix to torsemide; he also has requested a different cardiologist (Dr. BerrGwenlyn Foundpt on 10/10/2019) -If he continues to have difficulty with fluid retention, we can consider further dasatinib dose reduction vs. changing it to nilotinib   Normocytic anemia -Most likely secondary to CML and dasatinib -Hgb  12.3, stable  -Patient denies any symptoms of bleeding -We will monitor it for now   Hyponatremia -Na 130 today, slowly downtrending -Likely due to volume overload -See the management above -We will monitor it closely   Atrial fibrillation -S/p cardioversion in 2015 -Currently on Eliquis -He denies any symptoms of bleeding or excess bruising -Continue follow-up with cardiology   Orders Placed This Encounter  Procedures  . Xray, chest (2 view)    Standing Status:   Future    Standing Expiration Date:   09/18/2020    Order Specific Question:   Reason for Exam (SYMPTOM  OR DIAGNOSIS REQUIRED)    Answer:   Dyspnea    Order Specific Question:   Preferred imaging location?    Answer:   [facility] Question:   Radiology Contrast Protocol - do NOT remove file path    Answer:   [email or web address]    All questions were answered. The patient knows to call the clinic with any problems, questions or concerns. No barriers to learning was detected.  Return in 4 weeks for labs and clinic appt.  Lance Hubbard Tish Men 09/19/2019 10:54 AM  CHIEF COMPLAINT: "I feel a lot worse"  INTERVAL HISTORY: Lance Hubbard to clinic for follow-up of chronic phase CML on dasatinib.  Patient reports that over the past few weeks, he has felt progressively more short of breath, both the rest and with exertion.  He has been discussing the symptoms with his cardiologist, who initially recommended taking Lasix 40 mg daily, and then 20 mg daily due to him  feeling dizzy.  His cardiologist has also been adjusting his blood pressure medications, and changed Entresto to losartan due to coughing.  He feels like he is short of breath described taking deep inspiration.  He denies any constitutional symptoms.  REVIEW OF SYSTEMS:   Constitutional: ( - ) fevers, ( - )  chills , ( - ) night sweats Eyes: ( - ) blurriness of vision, ( - ) double vision, ( - )  watery eyes Ears, nose, mouth, throat, and face: ( - ) mucositis, ( - ) sore throat Respiratory: ( + ) cough, ( + ) dyspnea, ( - ) wheezes Cardiovascular: ( - ) palpitation, ( - ) chest discomfort, ( + ) lower extremity swelling Gastrointestinal:  ( - ) nausea, ( - ) heartburn, ( - ) change in bowel habits Skin: ( - ) abnormal skin rashes Lymphatics: ( - ) new lymphadenopathy, ( - ) easy bruising Neurological: ( - ) numbness, ( - ) tingling, ( - ) new weaknesses Behavioral/Psych: ( - ) mood change, ( - ) new changes  All other systems were reviewed with the patient and are negative.  SUMMARY OF ONCOLOGIC HISTORY: Oncology History   No history exists.    I have reviewed the past medical history, past surgical history, social history and family history with the patient and they are unchanged from previous note.  ALLERGIES:  is allergic to amoxicillin.  MEDICATIONS:  Current Outpatient Medications  Medication Sig Dispense Refill  . acetaminophen (TYLENOL) 500 MG tablet Take 1,500 mg by mouth 2 (two) times a day.    [name] amiodarone (PACERONE) 200 MG tablet     . apixaban (ELIQUIS) 5 MG TABS tablet Take 1 tablet (5 mg total) by mouth every 12 (twelve) hours. 60 tablet 3  . clobetasol (TEMOVATE) 0.05 % external solution Apply 1 application topically 2 (two) times daily as needed (for bug bites).     . furosemide (LASIX) 40 MG tablet Take 0.5 tablets (20 mg total) by mouth daily. 30 tablet 3  . Polyethyl Glycol-Propyl Glycol (SYSTANE ULTRA) 0.4-0.3 % SOLN Place 1-2 drops into both eyes 3 (three) times daily as needed (for dry eyes).    . sacubitril-valsartan (ENTRESTO) 49-51 MG Take 1 tablet by mouth 2 (two) times daily. 60 tablet 0   No current facility-administered medications for this visit.     PHYSICAL EXAMINATION: ECOG PERFORMANCE STATUS: 2 - Symptomatic, <50% confined to bed  There were no vitals filed for this visit. There is no height or weight on file to calculate BMI.  There  were no vitals filed for this visit.  GENERAL: alert, no distress and comfortable SKIN: skin color, texture, turgor are normal, no rashes or significant lesions EYES: conjunctiva are pink and non-injected, sclera clear OROPHARYNX: no exudate, no erythema; lips, buccal mucosa, and tongue normal  NECK: supple, non-tender LUNGS: decreased air movement in the right lung base, lungs otherwise clear to auscultation  HEART: regular rate & rhythm and no murmurs and 1+ bilateral lower extremity edema ABDOMEN: soft, non-tender, non-distended, normal bowel sounds Musculoskeletal: no cyanosis of digits and no clubbing  PSYCH: alert & oriented x 3, fluent speech  LABORATORY DATA:  I have reviewed the data as listed    Component Value Date/Time   NA 134 (L) 08/17/2019 0959   K 4.2 08/17/2019 0959   CL 98 08/17/2019 0959   CO2 28 08/17/2019 0959   GLUCOSE 115 (H) 08/17/2019 0959   BUN 13 08/17/2019 0959  CREATININE 1.01 08/17/2019 0959   CALCIUM 9.4 08/17/2019 0959   PROT 7.2 08/17/2019 0959   ALBUMIN 4.6 08/17/2019 0959   AST 26 08/17/2019 0959   ALT 30 08/17/2019 0959   ALKPHOS 64 08/17/2019 0959   BILITOT 0.4 08/17/2019 0959   GFRNONAA >60 08/17/2019 0959   GFRAA >60 08/17/2019 0959    No results found for: SPEP, UPEP  Lab Results  Component Value Date   WBC 6.3 09/19/2019   NEUTROABS 2.6 09/19/2019   HGB 12.3 (L) 09/19/2019   HCT 37.0 (L) 09/19/2019   MCV 88.1 09/19/2019   PLT 191 09/19/2019      Chemistry      Component Value Date/Time   NA 134 (L) 08/17/2019 0959   K 4.2 08/17/2019 0959   CL 98 08/17/2019 0959   CO2 28 08/17/2019 0959   BUN 13 08/17/2019 0959   CREATININE 1.01 08/17/2019 0959      Component Value Date/Time   CALCIUM 9.4 08/17/2019 0959   ALKPHOS 64 08/17/2019 0959   AST 26 08/17/2019 0959   ALT 30 08/17/2019 0959   BILITOT 0.4 08/17/2019 0959       RADIOGRAPHIC STUDIES: I have personally reviewed the radiological images as listed below and  agreed with the findings in the report. No results found.

## 2019-09-20 ENCOUNTER — Other Ambulatory Visit: Payer: Self-pay | Admitting: *Deleted

## 2019-09-20 ENCOUNTER — Telehealth: Payer: Self-pay | Admitting: Hematology

## 2019-09-20 ENCOUNTER — Telehealth: Payer: Self-pay | Admitting: *Deleted

## 2019-09-20 ENCOUNTER — Other Ambulatory Visit (HOSPITAL_COMMUNITY)
Admission: RE | Admit: 2019-09-20 | Discharge: 2019-09-20 | Disposition: A | Discharge status: Home / Self Care | Payer: Medicare HMO | Source: Ambulatory Visit | Attending: Hematology | Admitting: Hematology

## 2019-09-20 DIAGNOSIS — Z01812 Encounter for preprocedural laboratory examination: Secondary | ICD-10-CM | POA: Insufficient documentation

## 2019-09-20 DIAGNOSIS — Z20828 Contact with and (suspected) exposure to other viral communicable diseases: Secondary | ICD-10-CM | POA: Diagnosis not present

## 2019-09-20 LAB — SARS CORONAVIRUS 2 (TAT 6-24 HRS): SARS Coronavirus 2: NEGATIVE

## 2019-09-20 NOTE — Telephone Encounter (Signed)
Appointments scheduled letter/calendar mailed per 11/24 los °

## 2019-09-20 NOTE — Telephone Encounter (Signed)
Called pt with appt information. Thoracentesis 11/27 0945 Covid-19 Test today at 1130. Pt verbalized understanding, pt confirmed he wrote down appts and times. No further concerns.

## 2019-09-22 ENCOUNTER — Other Ambulatory Visit: Payer: Self-pay

## 2019-09-22 ENCOUNTER — Ambulatory Visit (HOSPITAL_COMMUNITY)
Admission: RE | Admit: 2019-09-22 | Discharge: 2019-09-22 | Disposition: A | Discharge status: Home / Self Care | Payer: Medicare HMO | Source: Ambulatory Visit | Attending: Hematology | Admitting: Hematology

## 2019-09-22 ENCOUNTER — Ambulatory Visit (HOSPITAL_COMMUNITY)
Admission: RE | Admit: 2019-09-22 | Discharge: 2019-09-22 | Disposition: A | Discharge status: Home / Self Care | Payer: Medicare HMO | Source: Ambulatory Visit | Attending: Radiology | Admitting: Radiology

## 2019-09-22 DIAGNOSIS — Z9889 Other specified postprocedural states: Secondary | ICD-10-CM | POA: Insufficient documentation

## 2019-09-22 DIAGNOSIS — J90 Pleural effusion, not elsewhere classified: Secondary | ICD-10-CM | POA: Insufficient documentation

## 2019-09-22 DIAGNOSIS — I5020 Unspecified systolic (congestive) heart failure: Secondary | ICD-10-CM | POA: Insufficient documentation

## 2019-09-22 DIAGNOSIS — R0600 Dyspnea, unspecified: Secondary | ICD-10-CM | POA: Diagnosis not present

## 2019-09-22 MED ORDER — LIDOCAINE HCL 1 % IJ SOLN
INTRAMUSCULAR | Status: AC
  Filled 2019-09-22: qty 10

## 2019-09-22 NOTE — Procedures (Signed)
Ultrasound-guided diagnostic and therapeutic right thoracentesis performed yielding 1.7 liters of yellow fluid. No immediate complications. Follow-up chest x-ray pending. A portion of the fluid was sent to the lab for preordered studies. EBL< 1cc.

## 2019-09-25 LAB — CYTOLOGY - NON PAP

## 2019-09-26 ENCOUNTER — Other Ambulatory Visit: Payer: Self-pay

## 2019-09-26 ENCOUNTER — Ambulatory Visit (HOSPITAL_BASED_OUTPATIENT_CLINIC_OR_DEPARTMENT_OTHER)
Admission: RE | Admit: 2019-09-26 | Discharge: 2019-09-26 | Disposition: A | Discharge status: Home / Self Care | Payer: Medicare HMO | Source: Ambulatory Visit | Attending: Hematology | Admitting: Hematology

## 2019-09-26 DIAGNOSIS — R0600 Dyspnea, unspecified: Secondary | ICD-10-CM | POA: Insufficient documentation

## 2019-09-26 DIAGNOSIS — I5020 Unspecified systolic (congestive) heart failure: Secondary | ICD-10-CM | POA: Insufficient documentation

## 2019-09-26 NOTE — Progress Notes (Signed)
  Echocardiogram 2D Echocardiogram has been performed.  Lance Hubbard 09/26/2019, 1:46 PM

## 2019-09-27 ENCOUNTER — Telehealth: Payer: Self-pay | Admitting: *Deleted

## 2019-09-27 ENCOUNTER — Other Ambulatory Visit: Payer: Self-pay | Admitting: Hematology

## 2019-09-27 DIAGNOSIS — I5020 Unspecified systolic (congestive) heart failure: Secondary | ICD-10-CM

## 2019-09-27 DIAGNOSIS — C9210 Chronic myeloid leukemia, BCR/ABL-positive, not having achieved remission: Secondary | ICD-10-CM

## 2019-09-27 MED ORDER — TORSEMIDE 20 MG PO TABS: 20.0000 mg | ORAL_TABLET | Freq: Every day | ORAL | 4 refills | Status: DC | PRN

## 2019-09-27 NOTE — Telephone Encounter (Signed)
Received call from wife stating,"Lance Hubbard had an Echo yesterday. It seems when he takes Lasix he has a lot of coughing. Once, the fluid is out of his system, he stops coughing. Can Dr. Maylon Peppers prescribe something besides Lasix, or is this normal? Return number is [phone].

## 2019-10-02 ENCOUNTER — Other Ambulatory Visit: Payer: Medicare HMO

## 2019-10-02 ENCOUNTER — Ambulatory Visit: Payer: Medicare HMO | Admitting: Hematology

## 2019-10-03 ENCOUNTER — Telehealth: Payer: Self-pay | Admitting: *Deleted

## 2019-10-03 ENCOUNTER — Other Ambulatory Visit: Payer: Self-pay | Admitting: Hematology

## 2019-10-03 DIAGNOSIS — C9210 Chronic myeloid leukemia, BCR/ABL-positive, not having achieved remission: Secondary | ICD-10-CM

## 2019-10-03 LAB — BCR/ABL

## 2019-10-03 MED ORDER — DASATINIB 80 MG PO TABS: 80.0000 mg | ORAL_TABLET | Freq: Every day | ORAL | 5 refills | Status: AC

## 2019-10-03 MED ORDER — DASATINIB 80 MG PO TABS: 80.0000 mg | ORAL_TABLET | Freq: Every day | ORAL | 5 refills | Status: DC

## 2019-10-03 MED FILL — SPRYCEL 80 MG TABLET: 80 | 30 days supply | Qty: 30 | Fill #0

## 2019-10-03 NOTE — Telephone Encounter (Signed)
Per Dr. Maylon Peppers, it's OK to start Levothyroxine 25 mcg prescribed by Dr. Terrence Dupont, his cardiologist. Patient verbalized understanding.

## 2019-10-05 ENCOUNTER — Ambulatory Visit: Payer: Medicare HMO | Admitting: Cardiovascular Disease

## 2019-10-10 ENCOUNTER — Ambulatory Visit: Payer: Medicare HMO | Admitting: Cardiovascular Disease

## 2019-10-24 ENCOUNTER — Telehealth: Payer: Self-pay | Admitting: Hematology

## 2019-10-24 MED FILL — SPRYCEL 50 MG TABLET: 50 | 30 days supply | Qty: 30 | Fill #0

## 2019-10-24 NOTE — Telephone Encounter (Signed)
I called and lmvm for patient to call the office to cancel 12/31 appts per 12/29 staff message

## 2019-10-26 ENCOUNTER — Other Ambulatory Visit: Payer: Medicare HMO

## 2019-10-26 ENCOUNTER — Ambulatory Visit: Payer: Medicare HMO | Admitting: Hematology

## 2019-11-07 ENCOUNTER — Other Ambulatory Visit: Payer: Self-pay | Admitting: Hematology

## 2019-11-07 DIAGNOSIS — R0600 Dyspnea, unspecified: Secondary | ICD-10-CM

## 2019-11-07 DIAGNOSIS — R0609 Other forms of dyspnea: Secondary | ICD-10-CM

## 2019-11-15 ENCOUNTER — Ambulatory Visit: Payer: Medicare Other | Attending: Internal Medicine

## 2019-11-15 DIAGNOSIS — Z23 Encounter for immunization: Secondary | ICD-10-CM | POA: Insufficient documentation

## 2019-11-20 MED FILL — SPRYCEL 50 MG TABLET: 50 | 30 days supply | Qty: 30 | Fill #1

## 2019-12-04 ENCOUNTER — Ambulatory Visit: Payer: Medicare HMO | Attending: Internal Medicine

## 2019-12-04 DIAGNOSIS — Z23 Encounter for immunization: Secondary | ICD-10-CM | POA: Insufficient documentation

## 2019-12-04 NOTE — Progress Notes (Signed)
   Covid-19 Vaccination Clinic  Name:  Lance Hubbard    MRN: [id number] DOB: [birth year]  12/04/2019  [name] was observed post Covid-19 immunization for 15 minutes without incidence. He was provided with Vaccine Information Sheet and instruction to access the V-Safe system.   [name] was instructed to call 911 with any severe reactions post vaccine: [name] Difficulty breathing  . Swelling of your face and throat  . A fast heartbeat  . A bad rash all over your body  . Dizziness and weakness    Immunizations Administered    Name Date Dose VIS Date Route   Pfizer COVID-19 Vaccine 12/04/2019  5:57 PM 0.3 mL 10/06/2019 Intramuscular   Manufacturer: [place]   Lot: [id number]   [place]: [id number]

## 2019-12-18 MED FILL — SPRYCEL 50 MG TABLET: 50 | 30 days supply | Qty: 30 | Fill #2

## 2020-01-15 MED FILL — SPRYCEL 50 MG TABLET: 50 | 30 days supply | Qty: 30 | Fill #3

## 2020-02-19 MED FILL — SPRYCEL 50 MG TABLET: 50 | 30 days supply | Qty: 30 | Fill #4

## 2020-03-18 MED FILL — SPRYCEL 50 MG TABLET: 50 | 30 days supply | Qty: 30 | Fill #5

## 2020-04-15 MED FILL — SPRYCEL 50 MG TABLET: 50 | 30 days supply | Qty: 30 | Fill #6

## 2020-04-23 IMAGING — CR LUMBAR SPINE - 2-3 VIEW
2 series · 2 of 2 positions shown · non-contrast
Comparison: 04/25/2019

CLINICAL DATA: L4-5 laminectomy

EXAM:
LUMBAR SPINE - 2-3 VIEW

[lateral (1 of 2)]
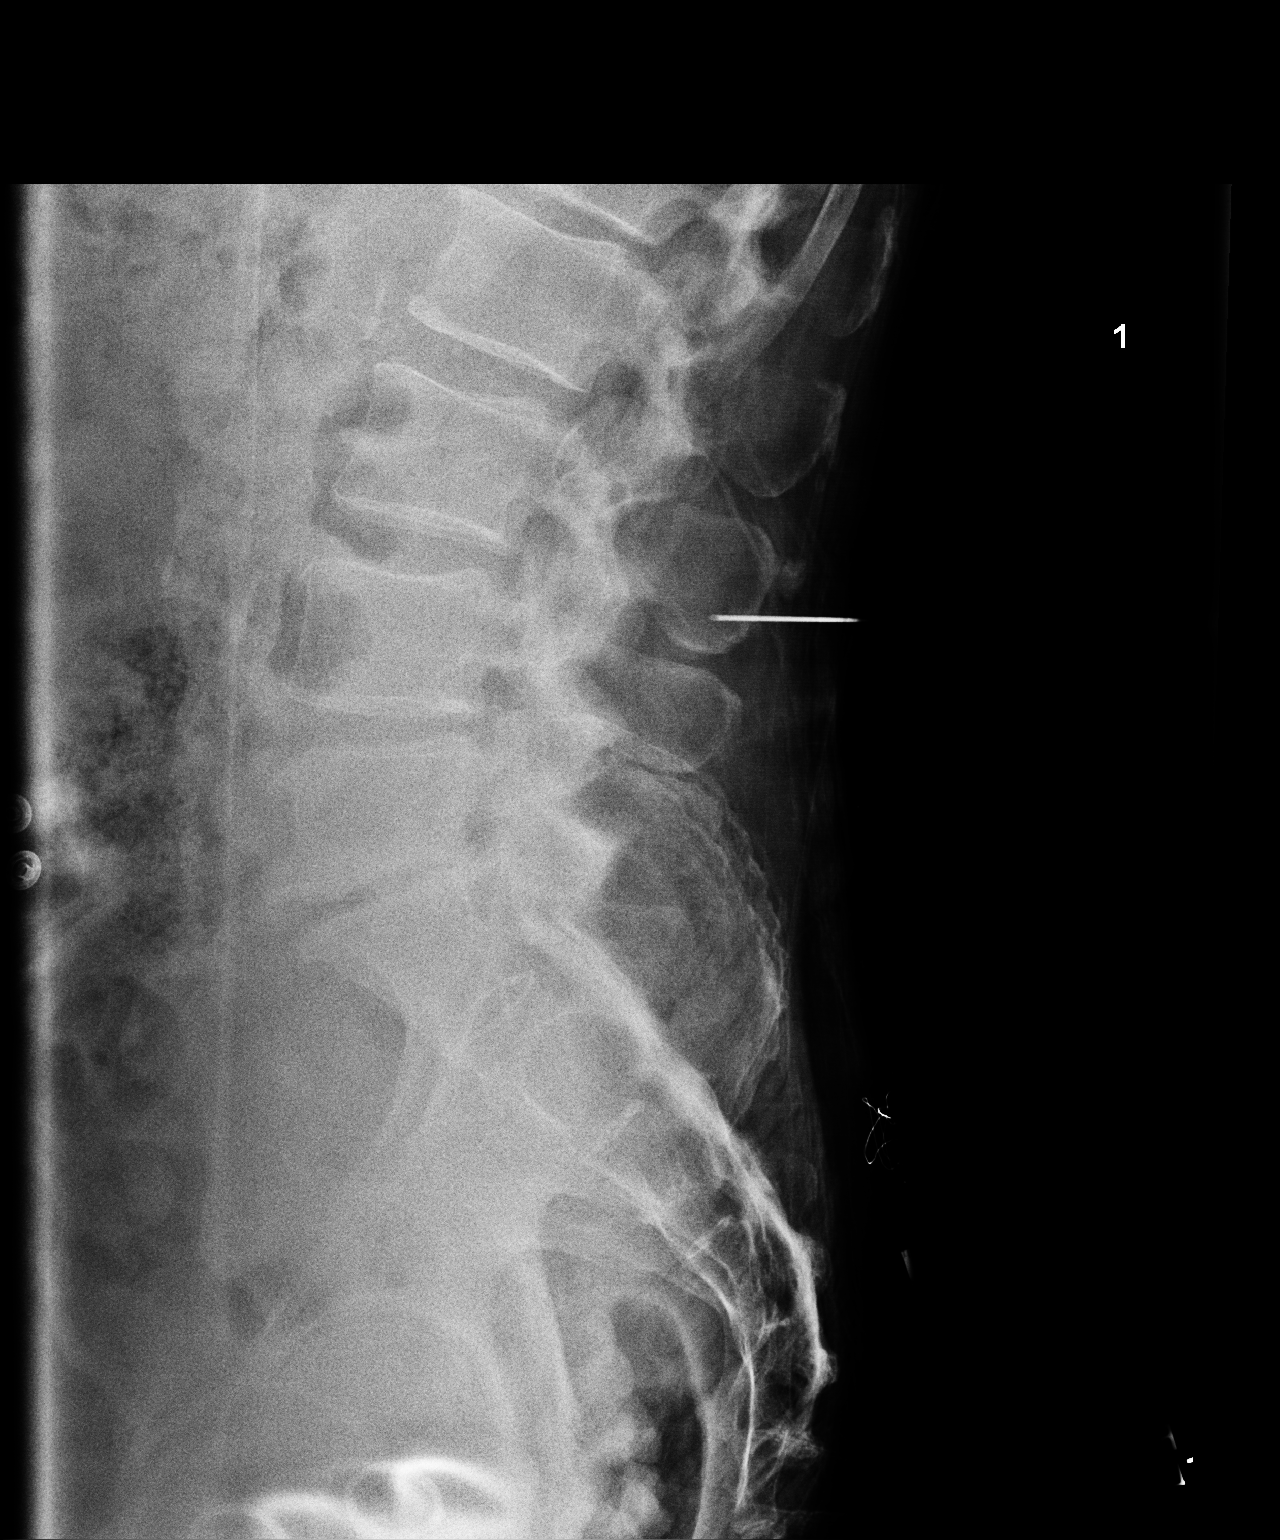

[lateral (2 of 2)]
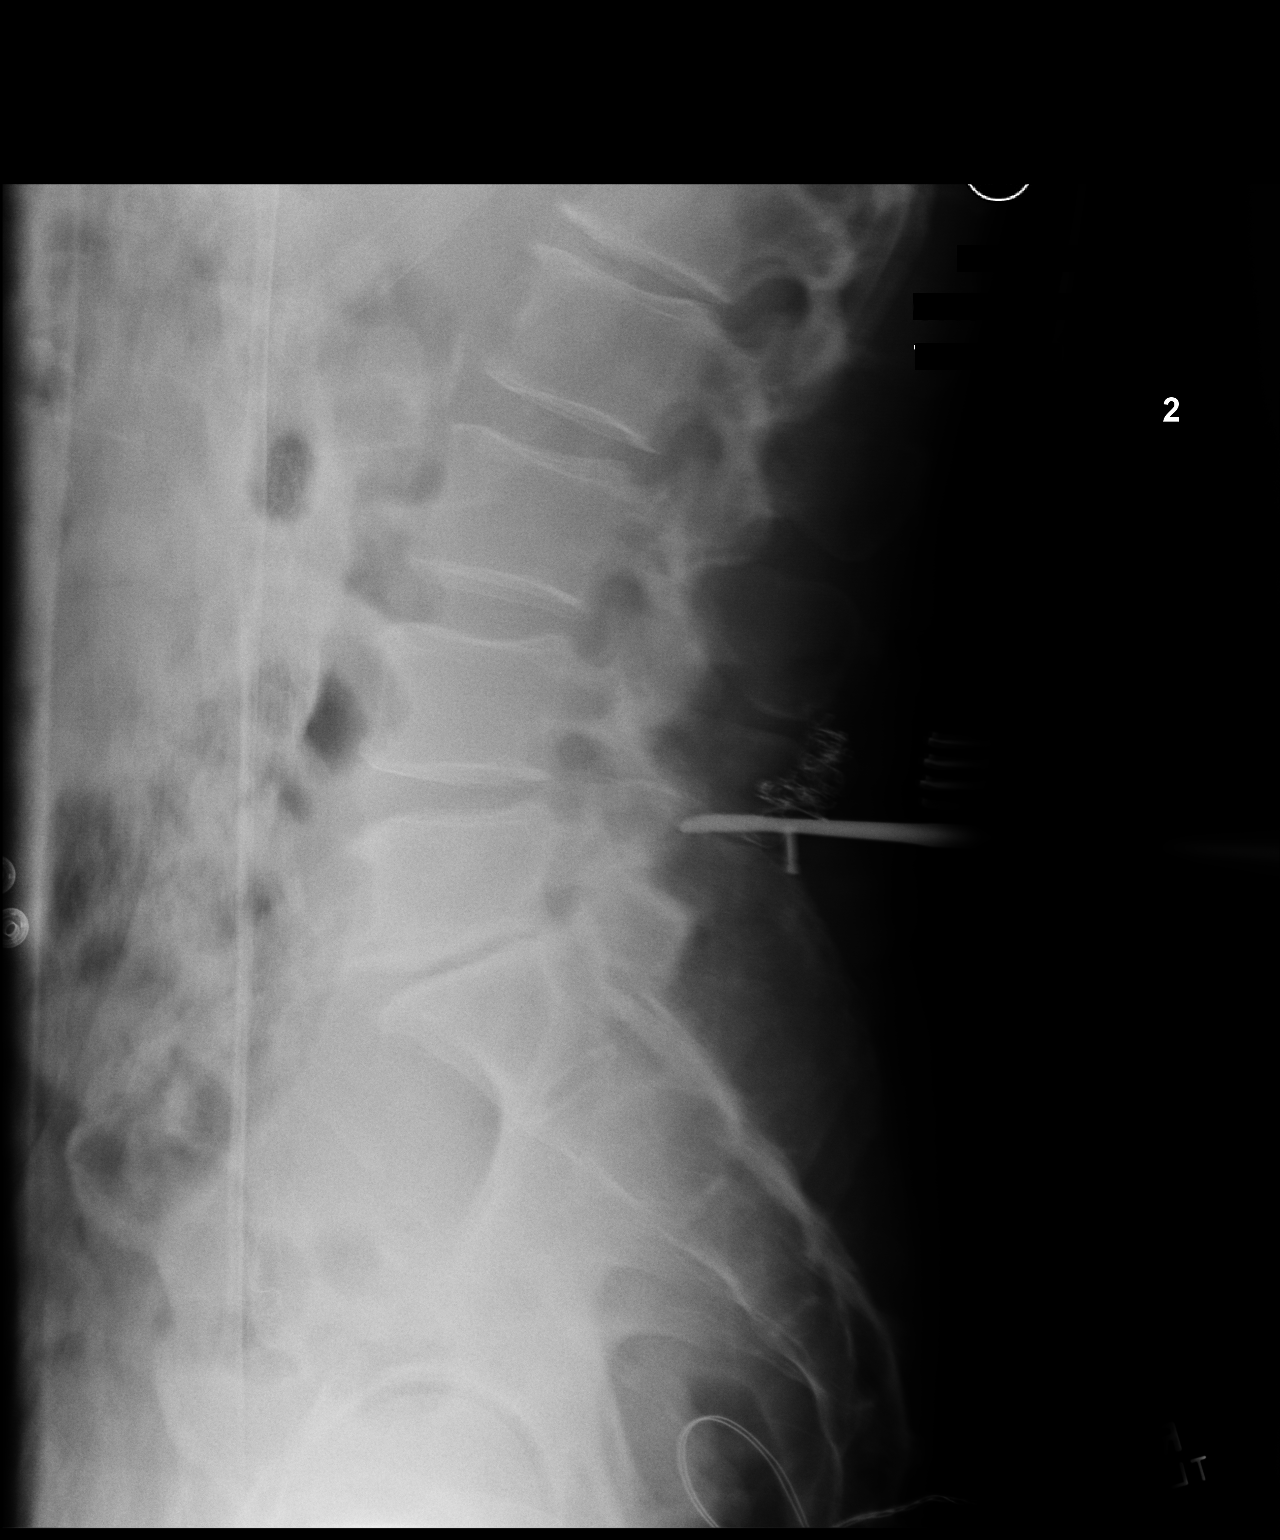

[2 of 2 positions shown; findings below may reference images not displayed]

FINDINGS: First lateral intraoperative image demonstrates a posterior needle
overlying the L3 spinous process.

Second lateral intraoperative image demonstrates posterior surgical
instruments directed at the L4-5 disc space.
IMPRESSION: Intraoperative imaging as above.

## 2020-05-13 MED FILL — SPRYCEL 50 MG TABLET: 50 | 30 days supply | Qty: 30 | Fill #7

## 2020-06-07 MED FILL — SPRYCEL 50 MG TABLET: 50 | 30 days supply | Qty: 30 | Fill #8

## 2020-06-18 ENCOUNTER — Ambulatory Visit: Payer: Medicare HMO | Attending: Internal Medicine

## 2020-06-18 DIAGNOSIS — Z23 Encounter for immunization: Secondary | ICD-10-CM

## 2020-06-18 NOTE — Progress Notes (Signed)
   Covid-19 Vaccination Clinic  Name:  Lance Hubbard    MRN: [id number] DOB: Oct 19, 1941  06/18/2020  [name] was observed post Covid-19 immunization for 15 minutes without incident. He was provided with Vaccine Information Sheet and instruction to access the V-Safe system.   [name] was instructed to call 911 with any severe reactions post vaccine: [name] Difficulty breathing  . Swelling of face and throat  . A fast heartbeat  . A bad rash all over body  . Dizziness and weakness

## 2020-06-25 ENCOUNTER — Ambulatory Visit: Payer: Medicare HMO

## 2020-07-08 MED FILL — SPRYCEL 50 MG TABLET: 50 | 30 days supply | Qty: 30 | Fill #9

## 2020-07-24 IMAGING — DX DG CHEST 2V
2 series · 2 of 2 positions shown · non-contrast
Comparison: May 02, 2019

CLINICAL DATA: Shortness of breath

EXAM:
CHEST - 2 VIEW

[chest pa]
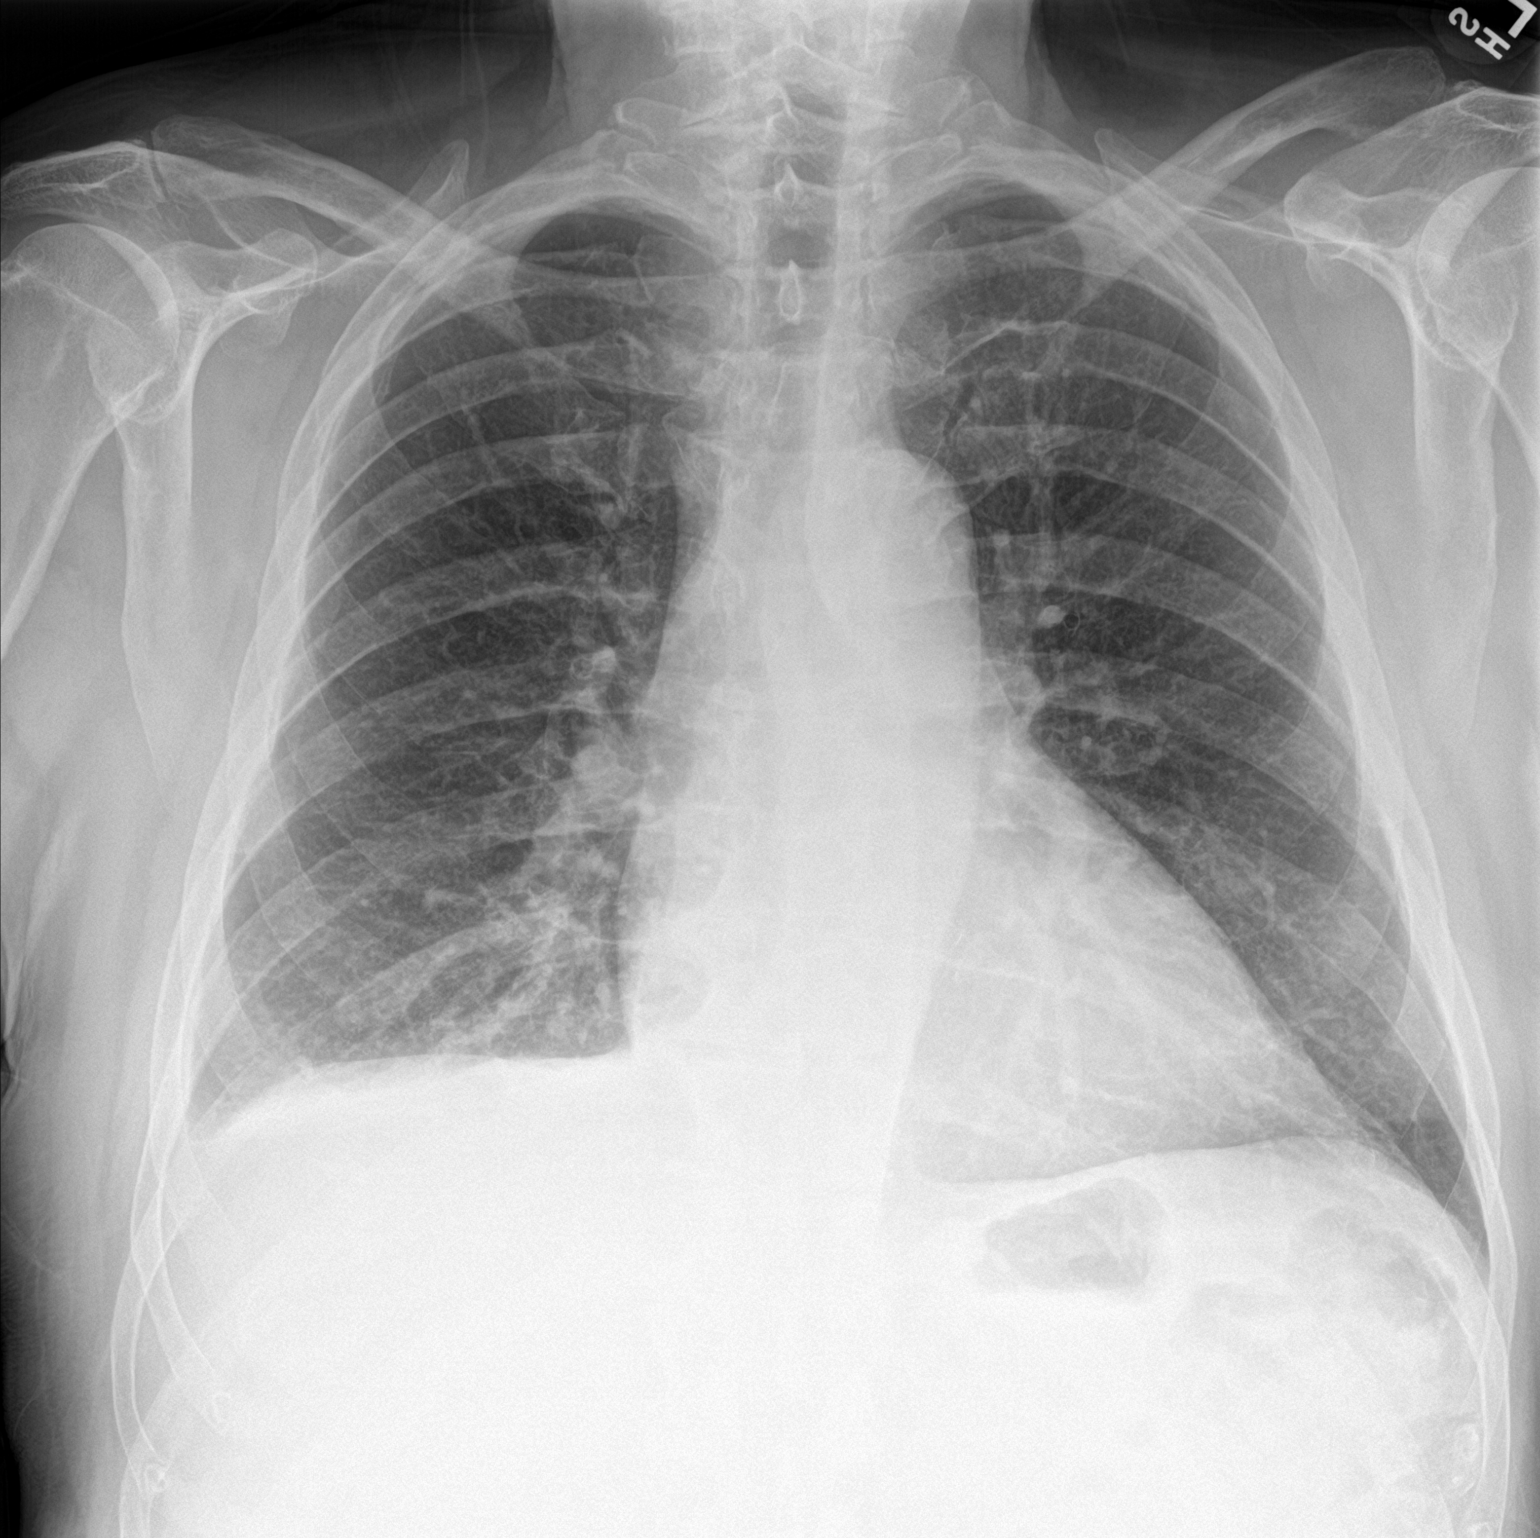

[chest lat]
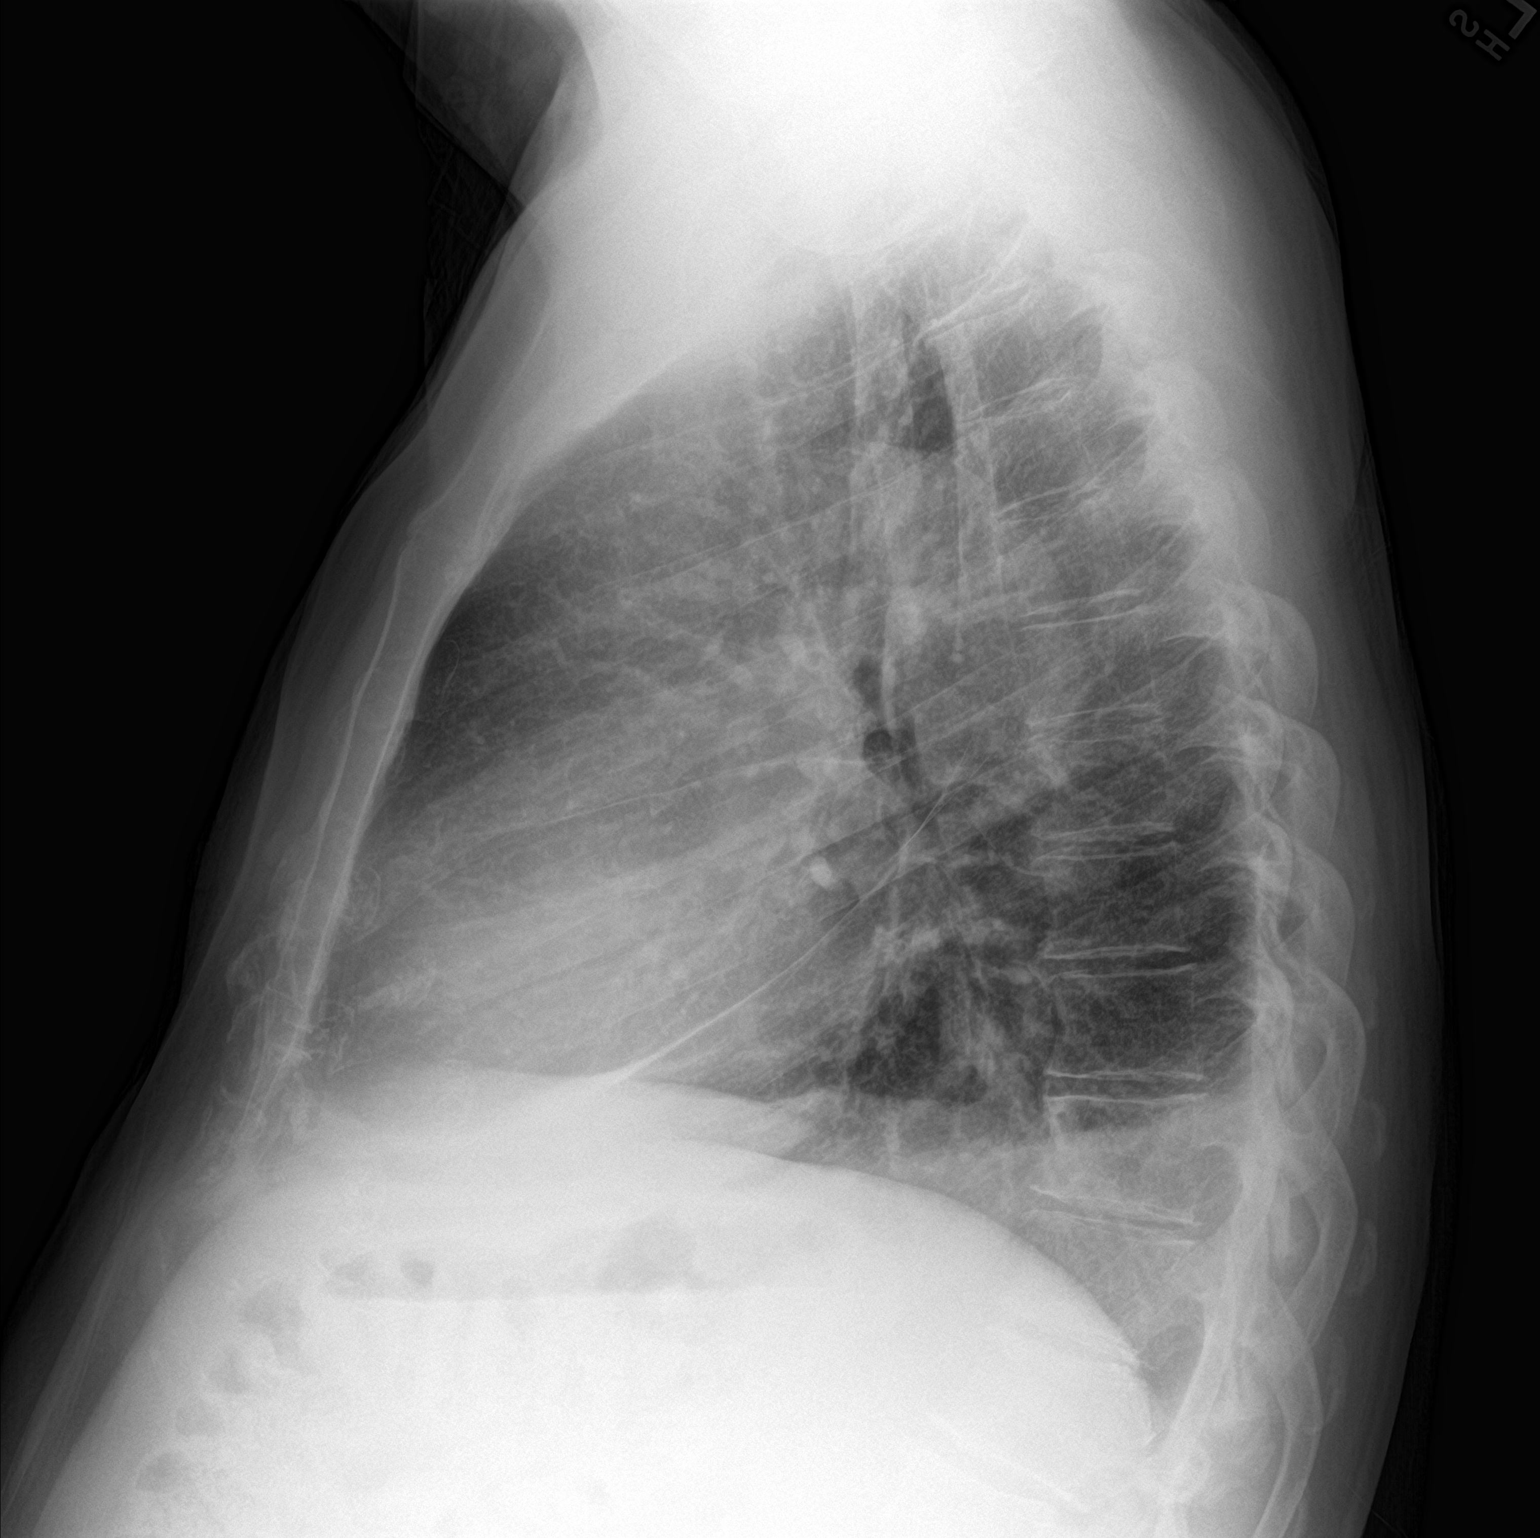

[2 of 2 positions shown; findings below may reference images not displayed]

FINDINGS: There is a small right pleural effusion with mild right base
atelectasis. The lungs elsewhere are clear. Heart size and pulmonary
vascularity are normal. No adenopathy. No bone lesions.
IMPRESSION: Small right pleural effusion with mild right base atelectasis. Lungs
elsewhere clear. Cardiac silhouette within normal limits.

## 2020-08-12 MED FILL — SPRYCEL 50 MG TABLET: 50 | 30 days supply | Qty: 30 | Fill #10

## 2020-08-29 IMAGING — US US THORACENTESIS ASP PLEURAL SPACE W/IMG GUIDE
1 series · 3 of 3 positions shown · non-contrast
Comparison: none

INDICATION: Patient with history of CML, dyspnea, right pleural effusion.
Request made for diagnostic and therapeutic right thoracentesis.

[Series 1: us thoracentesis asp pleural space w/img guide · 3 of 3 slices shown]
[im 1/3]
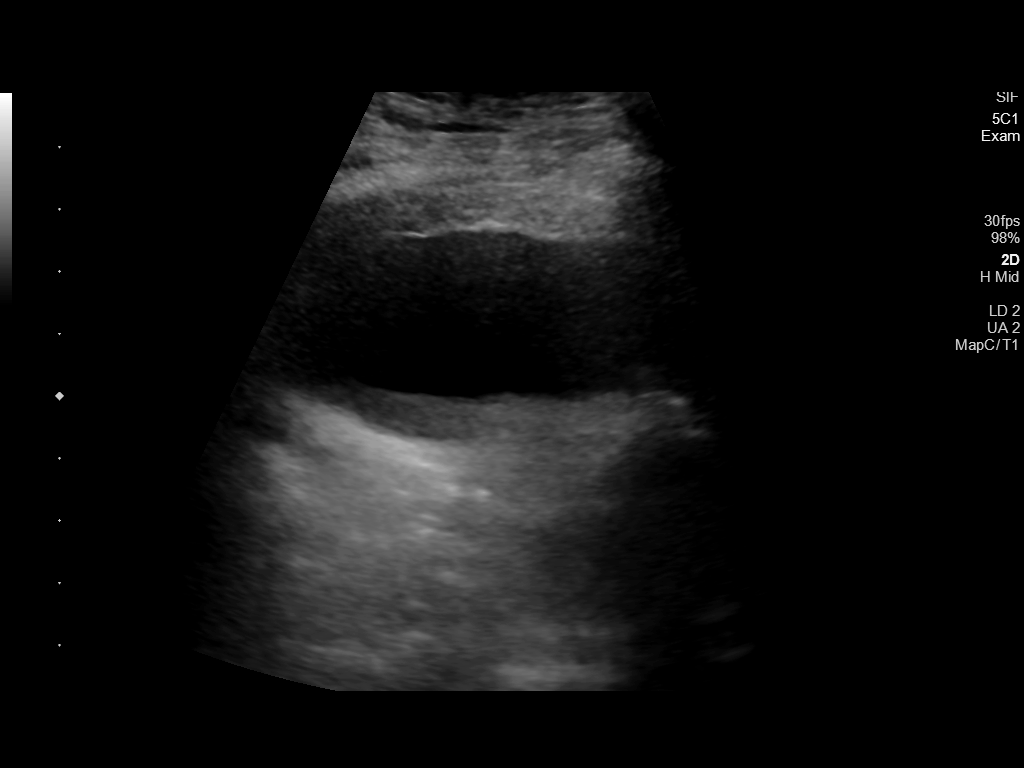
[im 2/3]
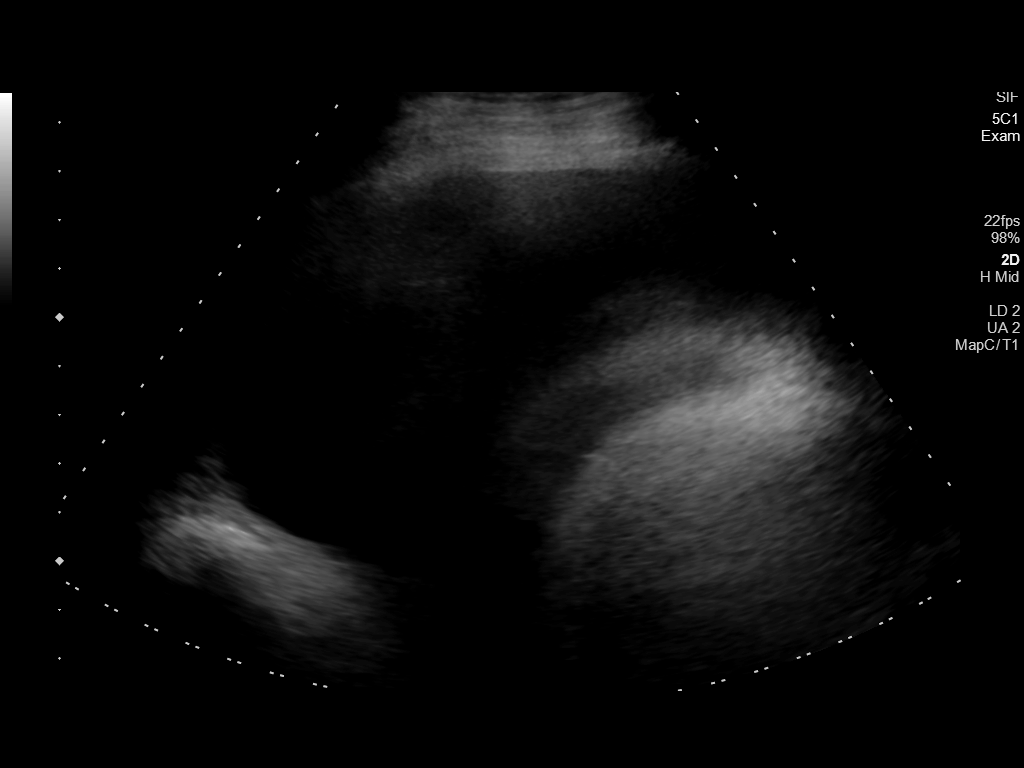
[im 3/3]
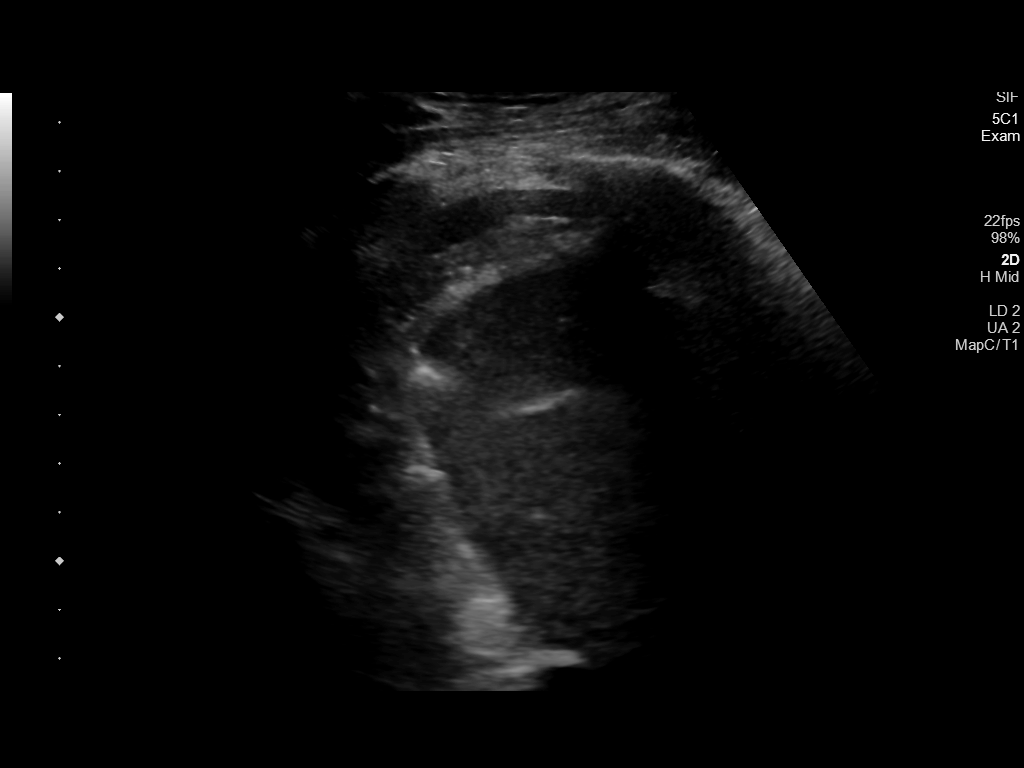

[3 of 3 positions shown; findings below may reference images not displayed]

EXAM:
ULTRASOUND GUIDED DIAGNOSTIC AND THERAPEUTIC RIGHT THORACENTESIS

MEDICATIONS:
None

COMPLICATIONS:
None immediate.

PROCEDURE:
An ultrasound guided thoracentesis was thoroughly discussed with the
patient and questions answered. The benefits, risks, alternatives
and complications were also discussed. The patient understands and
wishes to proceed with the procedure. Written consent was obtained.

Ultrasound was performed to localize and mark an adequate pocket of
fluid in the right chest. The area was then prepped and draped in
the normal sterile fashion. 1% Lidocaine was used for local
anesthesia. Under ultrasound guidance a 6 Fr Safe-T-Centesis
catheter was introduced. Thoracentesis was performed. The catheter
was removed and a dressing applied.
FINDINGS: A total of approximately 1.7 liters of yellow fluid was removed.
Samples were sent to the laboratory as requested by the clinical
team. Due to patient coughing only the above amount of fluid was
removed today.
IMPRESSION: Successful ultrasound guided diagnostic and therapeutic right
thoracentesis yielding 1.7 liters of pleural fluid.

## 2020-09-13 MED FILL — SPRYCEL 50 MG TABLET: 50 | 30 days supply | Qty: 30 | Fill #11

## 2020-10-09 ENCOUNTER — Other Ambulatory Visit (HOSPITAL_COMMUNITY): Payer: Self-pay | Admitting: Hematology & Oncology

## 2020-10-14 MED FILL — SPRYCEL 50 MG TABLET: 50 | 30 days supply | Qty: 30 | Fill #0

## 2020-11-07 MED FILL — SPRYCEL 50 MG TABLET: 50 | 30 days supply | Qty: 30 | Fill #1

## 2020-11-29 DIAGNOSIS — Z9221 Personal history of antineoplastic chemotherapy: Secondary | ICD-10-CM | POA: Diagnosis not present

## 2020-11-29 DIAGNOSIS — M545 Low back pain, unspecified: Secondary | ICD-10-CM | POA: Diagnosis not present

## 2020-11-29 DIAGNOSIS — E039 Hypothyroidism, unspecified: Secondary | ICD-10-CM | POA: Diagnosis not present

## 2020-11-29 DIAGNOSIS — C9210 Chronic myeloid leukemia, BCR/ABL-positive, not having achieved remission: Secondary | ICD-10-CM | POA: Diagnosis not present

## 2020-11-29 DIAGNOSIS — I4891 Unspecified atrial fibrillation: Secondary | ICD-10-CM | POA: Diagnosis not present

## 2020-11-29 DIAGNOSIS — E538 Deficiency of other specified B group vitamins: Secondary | ICD-10-CM | POA: Diagnosis not present

## 2020-11-29 DIAGNOSIS — G8929 Other chronic pain: Secondary | ICD-10-CM | POA: Diagnosis not present

## 2020-11-29 DIAGNOSIS — I5020 Unspecified systolic (congestive) heart failure: Secondary | ICD-10-CM | POA: Diagnosis not present

## 2020-11-29 DIAGNOSIS — C9211 Chronic myeloid leukemia, BCR/ABL-positive, in remission: Secondary | ICD-10-CM | POA: Diagnosis not present

## 2020-12-06 DIAGNOSIS — C9210 Chronic myeloid leukemia, BCR/ABL-positive, not having achieved remission: Secondary | ICD-10-CM | POA: Diagnosis not present

## 2020-12-09 MED FILL — SPRYCEL 50 MG TABLET: 50 | 30 days supply | Qty: 30 | Fill #2

## 2020-12-13 DIAGNOSIS — I480 Paroxysmal atrial fibrillation: Secondary | ICD-10-CM | POA: Diagnosis not present

## 2020-12-30 DIAGNOSIS — S4492XA Injury of unspecified nerve at shoulder and upper arm level, left arm, initial encounter: Secondary | ICD-10-CM | POA: Diagnosis not present

## 2020-12-30 DIAGNOSIS — S4491XA Injury of unspecified nerve at shoulder and upper arm level, right arm, initial encounter: Secondary | ICD-10-CM | POA: Diagnosis not present

## 2021-01-21 DIAGNOSIS — Z125 Encounter for screening for malignant neoplasm of prostate: Secondary | ICD-10-CM | POA: Diagnosis not present

## 2021-01-21 DIAGNOSIS — E538 Deficiency of other specified B group vitamins: Secondary | ICD-10-CM | POA: Diagnosis not present

## 2021-01-21 DIAGNOSIS — I4811 Longstanding persistent atrial fibrillation: Secondary | ICD-10-CM | POA: Diagnosis not present

## 2021-01-21 DIAGNOSIS — G629 Polyneuropathy, unspecified: Secondary | ICD-10-CM | POA: Diagnosis not present

## 2021-01-21 DIAGNOSIS — I10 Essential (primary) hypertension: Secondary | ICD-10-CM | POA: Diagnosis not present

## 2021-01-21 DIAGNOSIS — N5201 Erectile dysfunction due to arterial insufficiency: Secondary | ICD-10-CM | POA: Diagnosis not present

## 2021-01-21 DIAGNOSIS — E039 Hypothyroidism, unspecified: Secondary | ICD-10-CM | POA: Diagnosis not present

## 2021-01-21 DIAGNOSIS — Z0000 Encounter for general adult medical examination without abnormal findings: Secondary | ICD-10-CM | POA: Diagnosis not present

## 2021-01-21 DIAGNOSIS — C9210 Chronic myeloid leukemia, BCR/ABL-positive, not having achieved remission: Secondary | ICD-10-CM | POA: Diagnosis not present

## 2021-01-21 DIAGNOSIS — Z136 Encounter for screening for cardiovascular disorders: Secondary | ICD-10-CM | POA: Diagnosis not present

## 2021-01-23 ENCOUNTER — Other Ambulatory Visit (HOSPITAL_COMMUNITY): Payer: Self-pay

## 2021-02-04 ENCOUNTER — Other Ambulatory Visit (HOSPITAL_COMMUNITY): Payer: Self-pay

## 2021-02-10 ENCOUNTER — Other Ambulatory Visit (HOSPITAL_COMMUNITY): Payer: Self-pay

## 2021-02-10 MED FILL — Dasatinib Tab 50 MG: ORAL | 30 days supply | Qty: 30 | Fill #0 | Status: AC

## 2021-02-11 ENCOUNTER — Other Ambulatory Visit (HOSPITAL_COMMUNITY): Payer: Self-pay

## 2021-02-11 DIAGNOSIS — D225 Melanocytic nevi of trunk: Secondary | ICD-10-CM | POA: Diagnosis not present

## 2021-02-11 DIAGNOSIS — D485 Neoplasm of uncertain behavior of skin: Secondary | ICD-10-CM | POA: Diagnosis not present

## 2021-02-11 DIAGNOSIS — L720 Epidermal cyst: Secondary | ICD-10-CM | POA: Diagnosis not present

## 2021-02-11 DIAGNOSIS — L820 Inflamed seborrheic keratosis: Secondary | ICD-10-CM | POA: Diagnosis not present

## 2021-03-03 DIAGNOSIS — E538 Deficiency of other specified B group vitamins: Secondary | ICD-10-CM | POA: Diagnosis not present

## 2021-03-03 DIAGNOSIS — E785 Hyperlipidemia, unspecified: Secondary | ICD-10-CM | POA: Diagnosis not present

## 2021-03-03 DIAGNOSIS — I480 Paroxysmal atrial fibrillation: Secondary | ICD-10-CM | POA: Diagnosis not present

## 2021-03-03 DIAGNOSIS — I10 Essential (primary) hypertension: Secondary | ICD-10-CM | POA: Diagnosis not present

## 2021-03-03 DIAGNOSIS — E039 Hypothyroidism, unspecified: Secondary | ICD-10-CM | POA: Diagnosis not present

## 2021-03-03 DIAGNOSIS — I420 Dilated cardiomyopathy: Secondary | ICD-10-CM | POA: Diagnosis not present

## 2021-03-07 ENCOUNTER — Other Ambulatory Visit (HOSPITAL_COMMUNITY): Payer: Self-pay

## 2021-03-07 MED FILL — Dasatinib Tab 50 MG: ORAL | 30 days supply | Qty: 30 | Fill #1 | Status: AC

## 2021-03-10 DIAGNOSIS — I10 Essential (primary) hypertension: Secondary | ICD-10-CM | POA: Diagnosis not present

## 2021-03-10 DIAGNOSIS — E785 Hyperlipidemia, unspecified: Secondary | ICD-10-CM | POA: Diagnosis not present

## 2021-03-10 DIAGNOSIS — E039 Hypothyroidism, unspecified: Secondary | ICD-10-CM | POA: Diagnosis not present

## 2021-03-10 DIAGNOSIS — I482 Chronic atrial fibrillation, unspecified: Secondary | ICD-10-CM | POA: Diagnosis not present

## 2021-03-10 DIAGNOSIS — I420 Dilated cardiomyopathy: Secondary | ICD-10-CM | POA: Diagnosis not present

## 2021-03-10 DIAGNOSIS — E538 Deficiency of other specified B group vitamins: Secondary | ICD-10-CM | POA: Diagnosis not present

## 2021-03-12 ENCOUNTER — Other Ambulatory Visit (HOSPITAL_COMMUNITY): Payer: Self-pay

## 2021-03-14 DIAGNOSIS — E538 Deficiency of other specified B group vitamins: Secondary | ICD-10-CM | POA: Diagnosis not present

## 2021-03-14 DIAGNOSIS — I5030 Unspecified diastolic (congestive) heart failure: Secondary | ICD-10-CM | POA: Diagnosis not present

## 2021-03-14 DIAGNOSIS — M549 Dorsalgia, unspecified: Secondary | ICD-10-CM | POA: Diagnosis not present

## 2021-03-14 DIAGNOSIS — Z7901 Long term (current) use of anticoagulants: Secondary | ICD-10-CM | POA: Diagnosis not present

## 2021-03-14 DIAGNOSIS — C9210 Chronic myeloid leukemia, BCR/ABL-positive, not having achieved remission: Secondary | ICD-10-CM | POA: Diagnosis not present

## 2021-03-14 DIAGNOSIS — I110 Hypertensive heart disease with heart failure: Secondary | ICD-10-CM | POA: Diagnosis not present

## 2021-03-14 DIAGNOSIS — D513 Other dietary vitamin B12 deficiency anemia: Secondary | ICD-10-CM | POA: Diagnosis not present

## 2021-03-18 DIAGNOSIS — C9210 Chronic myeloid leukemia, BCR/ABL-positive, not having achieved remission: Secondary | ICD-10-CM | POA: Diagnosis not present

## 2021-03-19 DIAGNOSIS — E785 Hyperlipidemia, unspecified: Secondary | ICD-10-CM | POA: Diagnosis not present

## 2021-03-19 DIAGNOSIS — I10 Essential (primary) hypertension: Secondary | ICD-10-CM | POA: Diagnosis not present

## 2021-03-19 DIAGNOSIS — E039 Hypothyroidism, unspecified: Secondary | ICD-10-CM | POA: Diagnosis not present

## 2021-03-19 DIAGNOSIS — I482 Chronic atrial fibrillation, unspecified: Secondary | ICD-10-CM | POA: Diagnosis not present

## 2021-03-19 DIAGNOSIS — I420 Dilated cardiomyopathy: Secondary | ICD-10-CM | POA: Diagnosis not present

## 2021-03-19 DIAGNOSIS — E538 Deficiency of other specified B group vitamins: Secondary | ICD-10-CM | POA: Diagnosis not present

## 2021-03-21 DIAGNOSIS — R350 Frequency of micturition: Secondary | ICD-10-CM | POA: Diagnosis not present

## 2021-03-21 DIAGNOSIS — N480 Leukoplakia of penis: Secondary | ICD-10-CM | POA: Diagnosis not present

## 2021-03-21 DIAGNOSIS — N401 Enlarged prostate with lower urinary tract symptoms: Secondary | ICD-10-CM | POA: Diagnosis not present

## 2021-04-01 ENCOUNTER — Other Ambulatory Visit: Payer: Self-pay

## 2021-04-01 ENCOUNTER — Ambulatory Visit: Payer: Medicare HMO | Admitting: Sports Medicine

## 2021-04-01 VITALS — BP 138/79 | Ht 74.0 in | Wt 224.0 lb

## 2021-04-01 DIAGNOSIS — E785 Hyperlipidemia, unspecified: Secondary | ICD-10-CM | POA: Diagnosis not present

## 2021-04-01 DIAGNOSIS — I480 Paroxysmal atrial fibrillation: Secondary | ICD-10-CM | POA: Diagnosis not present

## 2021-04-01 DIAGNOSIS — I10 Essential (primary) hypertension: Secondary | ICD-10-CM | POA: Diagnosis not present

## 2021-04-01 DIAGNOSIS — R29898 Other symptoms and signs involving the musculoskeletal system: Secondary | ICD-10-CM | POA: Insufficient documentation

## 2021-04-01 DIAGNOSIS — E039 Hypothyroidism, unspecified: Secondary | ICD-10-CM | POA: Diagnosis not present

## 2021-04-01 DIAGNOSIS — E538 Deficiency of other specified B group vitamins: Secondary | ICD-10-CM | POA: Diagnosis not present

## 2021-04-01 DIAGNOSIS — I5020 Unspecified systolic (congestive) heart failure: Secondary | ICD-10-CM | POA: Diagnosis not present

## 2021-04-01 NOTE — Assessment & Plan Note (Signed)
Acute on chronic. Worsening. Patient presents with balance concerns. Overall gait appears stable with normal get up and go test. Neuro exam normal without evidence of cerebellar pathology. Appears most likely related to LE weakness given evidence of quadricept weakness on Sit to Stand evaluation and gluteus muscle weakness on trendelenburg testing. No joint or back pain. Reviewed prior labs and imaging that were all WNL. He has had some medication adjustments for his A-fib but feel these are less likely contributing given lack of dizziness/lightheadedness or other systemic symptoms. Recommended physical therapy. Referral placed. Follow up 1 month for continued monitoring, sooner if worsening. Discussed wait to improve safety at home in the mean time.

## 2021-04-01 NOTE — Patient Instructions (Signed)
I do expect a lot of your balance concerns are from weakness. The best treatment for this is physical therapy. We have placed an order and they will be in contact with you.  Please be sure to touch base with your cardiologist to be sure they have no reasons you should not participate in physical therapy. In the mean time, be sure to be careful when you walk. Use a cane to assist you as needed. Be sure to keep the area well lit when you walk and that you feel steady before you take steps.   Please follow up in 1 month for continued monitoring, sooner if things are worsening in the mean time.  Take care, [place]

## 2021-04-01 NOTE — Progress Notes (Addendum)
Subjective:   Patient ID: Lance Hubbard    DOB: 04/04/1941, 80 y.o. male   MRN: [id number]  Lance Hubbard is a 80 y.o. male with a history of A. 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  He notes that he thought this started years ago but seems to have worsened over the last 2 or 3 weeks.  He denies any falls, dizziness, lightheadedness.  He notes that he just feels weak particularly in the buttocks area.  His wife notes that they walk at least a mile 5 times a week and recently he is needed to use a walking stick when they walk.  She feels like he walks slower as well she does not endorse any instability when he walks.  He denies any chest pain, shortness of breath.  He denies any joint pains, muscle pains or back pain.  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  He denies any lumbar pain.  He also has a history of atrial fibrillation and per chart review patient has complained of feeling of palpitations. He has been referred to a.fib clinic.  They have made some medication adjustments including addition of metoprolol over the last month with recent increase about 2 weeks ago.  He had labs on 03/14/21 with normal B12, CMP, CBC (Hgb 13.1), LDH.  He denies any vision impairment.  Denies any over-the-counter medications and takes everything as prescribed.  Current medications include: Amiodarone 400 mg, apixaban 5 mg twice daily, Dasatinib QD, Entresto twice daily, torsemide 20 mg as needed, levothyroxine 25 MCG QD and metoprolol 25 mg twice daily.  He does note that they recently adjusted the metoprolol to 25 mg twice daily.  He  notes that this possibly could be contributing but is unsure.  Echo 09/26/2019:  IMPRESSIONS 1. Left ventricular ejection fraction, by visual estimation, is 60 to 65%. There is mildly increased left ventricular hypertrophy.  2. Global right ventricle has normal systolic function.The right  ventricular size is normal. No increase in right ventricular wall thickness.  3. Left atrial size was mildly dilated.  4. Right atrial size was mildly dilated.  5. Trivial pericardial effusion is present.  6. The mitral valve is normal in structure. Mild mitral valve regurgitation. No evidence of mitral stenosis.  7. The tricuspid valve is normal in structure. Tricuspid valve regurgitation is mild.  8. The aortic valve is normal in structure.  9. Mildly elevated pulmonary artery systolic pressure.  10. The inferior vena cava is dilated in size with <50% respiratory variability, suggesting right atrial pressure of 15 mmHg.   Review of Systems:  Per HPI.   Objective:   BP 138/79   Ht 6\' 2"  (1.88 m)   Wt 224 lb (101.6 kg)   BMI 28.76 kg/m  Vitals and nursing note reviewed.  General: pleasant elderly male, sitting comfortably in exam chair with wife at side, well nourished, well developed, in no acute distress with non-toxic appearance Resp: breathing comfortably on room air, speaking in full sentences Skin: warm, dry Extremities: warm and well perfused, normal tone MSK: 5/5 strength in UE and LE bilaterally, normal patellar, tricep, bicep, and achilles reflexes bilaterally, normal joint ROM of hip,  knee, and ankle, Get up and  Go test normal with normal stable gait, Chair stand did require did require use of arms on armrest and did drop at end of sitting motion, (+) trendelenburg bilaterally Neuro: Alert and oriented, speech normal, heel-to-shin and finger to nose normal, normal romberg, no pronator drift, EOMI, vision intact, CN grossly intact     Assessment & Plan:   Weakness of both lower  extremities Acute on chronic. Worsening. Patient presents with balance concerns. Overall gait appears stable with normal get up and go test. Neuro exam normal without evidence of cerebellar pathology. Appears most likely related to LE weakness given evidence of quadricept weakness on Sit to Stand evaluation and gluteus muscle weakness on trendelenburg testing. No joint or back pain. Reviewed prior labs and imaging that were all WNL. He has had some medication adjustments for his A-fib but feel these are less likely contributing given lack of dizziness/lightheadedness or other systemic symptoms. Recommended physical therapy. Referral placed. Follow up 1 month for continued monitoring, sooner if worsening. Discussed wait to improve safety at home in the mean time.   Orders Placed This Encounter  Procedures  . Ambulatory referral to Physical Therapy    Referral Priority:   Routine    Referral Type:   Physical Medicine    Referral Reason:   Specialty Services Required    Requested Specialty:   Physical Therapy    Number of Visits Requested:   1   No orders of the defined types were placed in this encounter.     Mina Marble, DO PGY-3, [facility] 04/01/2021 3:25 PM   Patient seen and examined with the resident.  I agree with the above plan of care.  Neurological exam does not suggest cerebellar ataxia.  I think his lower extremity weakness is secondary to deconditioning likely in part to his ongoing battle to control his A. fib.  I recommended physical therapy at [facility] as long as his cardiologist does not disagree.  Follow-up again in 4 weeks for reevaluation.  If symptoms worsen then consider referral to neurology.

## 2021-04-02 ENCOUNTER — Other Ambulatory Visit (HOSPITAL_COMMUNITY): Payer: Self-pay

## 2021-04-02 DIAGNOSIS — L720 Epidermal cyst: Secondary | ICD-10-CM | POA: Diagnosis not present

## 2021-04-02 MED FILL — Dasatinib Tab 50 MG: ORAL | 30 days supply | Qty: 30 | Fill #2 | Status: CN

## 2021-04-02 MED FILL — Dasatinib Tab 50 MG: ORAL | 30 days supply | Qty: 30 | Fill #2 | Status: AC

## 2021-04-07 ENCOUNTER — Other Ambulatory Visit (HOSPITAL_COMMUNITY): Payer: Self-pay

## 2021-04-08 ENCOUNTER — Other Ambulatory Visit: Payer: Self-pay

## 2021-04-08 ENCOUNTER — Ambulatory Visit (HOSPITAL_COMMUNITY)
Admission: RE | Admit: 2021-04-08 | Discharge: 2021-04-08 | Disposition: A | Discharge status: Home / Self Care | Payer: Medicare HMO | Source: Ambulatory Visit | Attending: Physician Assistant | Admitting: Physician Assistant

## 2021-04-08 ENCOUNTER — Encounter (HOSPITAL_COMMUNITY): Payer: Self-pay | Admitting: Physician Assistant

## 2021-04-08 ENCOUNTER — Other Ambulatory Visit (HOSPITAL_COMMUNITY): Payer: Self-pay

## 2021-04-08 VITALS — BP 146/80 | HR 50 | Ht 74.0 in | Wt 228.6 lb

## 2021-04-08 DIAGNOSIS — Z79890 Hormone replacement therapy (postmenopausal): Secondary | ICD-10-CM | POA: Insufficient documentation

## 2021-04-08 DIAGNOSIS — E785 Hyperlipidemia, unspecified: Secondary | ICD-10-CM | POA: Diagnosis not present

## 2021-04-08 DIAGNOSIS — Z87891 Personal history of nicotine dependence: Secondary | ICD-10-CM | POA: Insufficient documentation

## 2021-04-08 DIAGNOSIS — I5022 Chronic systolic (congestive) heart failure: Secondary | ICD-10-CM | POA: Diagnosis not present

## 2021-04-08 DIAGNOSIS — E039 Hypothyroidism, unspecified: Secondary | ICD-10-CM | POA: Diagnosis not present

## 2021-04-08 DIAGNOSIS — C9210 Chronic myeloid leukemia, BCR/ABL-positive, not having achieved remission: Secondary | ICD-10-CM | POA: Diagnosis not present

## 2021-04-08 DIAGNOSIS — Z7901 Long term (current) use of anticoagulants: Secondary | ICD-10-CM | POA: Insufficient documentation

## 2021-04-08 DIAGNOSIS — D6869 Other thrombophilia: Secondary | ICD-10-CM | POA: Diagnosis not present

## 2021-04-08 DIAGNOSIS — I110 Hypertensive heart disease with heart failure: Secondary | ICD-10-CM | POA: Diagnosis not present

## 2021-04-08 DIAGNOSIS — Z79899 Other long term (current) drug therapy: Secondary | ICD-10-CM | POA: Insufficient documentation

## 2021-04-08 DIAGNOSIS — I4819 Other persistent atrial fibrillation: Secondary | ICD-10-CM

## 2021-04-08 NOTE — Progress Notes (Signed)
Primary Care Physician: Christain Sacramento, MD Primary Cardiologist: Dr Terrence Dupont  Primary Electrophysiologist: none Referring Physician: Dr Sherril Cong is a 80 y.o. male with a history of HTN, systolic dysfunction, hypothyroidism, CML, HLD, tobacco abuse, and atrial fibrillation who presents for consultation in the [facility].  The patient was initially diagnosed with atrial fibrillation in 2015. He underwent DCCV at that time and has been maintained on amiodarone. Patient is on Eliquis for a CHADS2VASC score of 4. He was in his usual state of health until the beginning of May 2022 when he was noted to be back in afib with rapid rates. His amiodarone was increased and he was started on metoprolol. Today, he is back in SR.   Today, he denies symptoms of palpitations, chest pain, shortness of breath, orthopnea, PND, lower extremity edema, dizziness, presyncope, syncope, snoring, daytime somnolence, bleeding, or neurologic sequela. The patient is tolerating medications without difficulties and is otherwise without complaint today.    Atrial Fibrillation Risk Factors:  he does not have symptoms or diagnosis of sleep apnea. he does not have a history of rheumatic fever. he does not have a history of alcohol use.   he has a BMI of Body mass index is 29.35 kg/m.[name] Filed Weights   04/08/21 1337  Weight: 103.7 kg    Family History  Problem Relation Age of Onset   Heart disease Father    Hypertension Sister      Atrial Fibrillation Management history:  Previous antiarrhythmic drugs: amiodarone  Previous cardioversions: 2015 Previous ablations: none CHADS2VASC score: 4 Anticoagulation history: Eliquis   Past Medical History:  Diagnosis Date   Arthritis    Dysrhythmia    Atrial Fibrillation   History of kidney stones    Hypertension    Past Surgical History:  Procedure Laterality Date   CARDIOVERSION N/A 12/18/2013   Procedure:  CARDIOVERSION;  Surgeon: Birdie Riddle, MD;  Location: [facility] ENDOSCOPY;  Service: Cardiovascular;  Laterality: N/A;  9:58 Lido 40 mg, IV Propofol 40 mg, IV for anesthesia,  synched cardioversio @ 120 joules with successful NSR    COLONOSCOPY     KNEE ARTHROSCOPY     knee dsurgery     LEFT HEART CATHETERIZATION WITH CORONARY ANGIOGRAM N/A 12/19/2013   Procedure: LEFT HEART CATHETERIZATION WITH CORONARY ANGIOGRAM;  Surgeon: Clent Demark, MD;  Location: [place] CATH LAB;  Service: Cardiovascular;  Laterality: N/A;   LUMBAR LAMINECTOMY/DECOMPRESSION MICRODISCECTOMY N/A 05/17/2019   Procedure: Lumbar Four Lumbar Five Lumbar Laminectomy;  Surgeon: Jovita Gamma, MD;  [facility];  Service: Neurosurgery;  Laterality: N/A;  Lumbar 4 Lumbar 5 Lumbar Laminectomy   TEE WITHOUT CARDIOVERSION N/A 12/18/2013   Procedure: TRANSESOPHAGEAL ECHOCARDIOGRAM (TEE);  Surgeon: Birdie Riddle, MD;  Location[facility] ENDOSCOPY;  Service: Cardiovascular;  Laterality: N/A;    Current Outpatient Medications  Medication Sig Dispense Refill   acetaminophen (TYLENOL) 500 MG tablet Take 1,500 mg by mouth 2 (two) times a day.     amiodarone (PACERONE) 200 MG tablet Take 200 mg by mouth daily.     apixaban (ELIQUIS) 5 MG TABS tablet Take 1 tablet (5 mg total) by mouth every 12 (twelve) hours. 60 tablet 3   cyanocobalamin 1000 MCG tablet Take by mouth.     dasatinib (SPRYCEL) 50 MG tablet TAKE 1 TABLET (50 MG TOTAL) BY MOUTH DAILY. 30 tablet 11   levothyroxine (SYNTHROID) 25 MCG tablet Take 25 mcg by mouth daily.  metoprolol tartrate (LOPRESSOR) 25 MG tablet Take 12.5 mg by mouth 2 (two) times daily.     Polyethyl Glycol-Propyl Glycol (SYSTANE ULTRA) 0.4-0.3 % SOLN Place 1-2 drops into both eyes 3 (three) times daily as needed (for dry eyes).     sacubitril-valsartan (ENTRESTO) 49-51 MG 1/2 tablets in the AM, 1 tablet in PM     torsemide (DEMADEX) 20 MG tablet Take 1 tablet (20 mg total) by mouth daily as needed. 30 tablet 4    No current facility-administered medications for this encounter.    Allergies  Allergen Reactions   Amoxicillin Hives and Other (See Comments)    REACTION: whelps/pain in knees and ankles   Clavulanic Acid    Penicillin G     Other reaction(s): hives    Social History   Socioeconomic History   Marital status: Married    Spouse name: Not on file   Number of children: Not on file   Years of education: Not on file   Highest education level: Not on file  Occupational History   Not on file  Tobacco Use   Smoking status: Former    Pack years: 0.00    Types: Pipe    Quit date: 11/08/2018    Years since quitting: 2.4   Smokeless tobacco: Never  Vaping Use   Vaping Use: Never used  Substance and Sexual Activity   Alcohol use: Not Currently    Comment: stopped around 2012   Drug use: No   Sexual activity: Not on file  Other Topics Concern   Not on file  Social History Narrative   Not on file   Social Determinants of Health   Financial Resource Strain: Not on file  Food Insecurity: Not on file  Transportation Needs: Not on file  Physical Activity: Not on file  Stress: Not on file  Social Connections: Not on file  Intimate Partner Violence: Not on file     ROS- All systems are reviewed and negative except as per the HPI above.  Physical Exam: Vitals:   04/08/21 1337  BP: (!) 146/80  Pulse: (!) 50  Weight: 103.7 kg  Height: 6\' 2"  (1.88 m)    GEN- The patient is a well appearing elderly male, alert and oriented x 3 today.   Head- normocephalic, atraumatic Eyes-  Sclera clear, conjunctiva pink Ears- hearing intact Oropharynx- clear Neck- supple  Lungs- Clear to ausculation bilaterally, normal work of breathing Heart- Regular rate and rhythm, bradycardia, no murmurs, rubs or gallops  GI- soft, NT, ND, + BS Extremities- no clubbing, cyanosis, or edema MS- no significant deformity or atrophy Skin- no rash or lesion Psych- euthymic mood, full affect Neuro-  strength and sensation are intact  Wt Readings from Last 3 Encounters:  04/08/21 103.7 kg  04/01/21 101.6 kg  08/17/19 102.2 kg    EKG today demonstrates  SB Vent. rate 50 BPM PR interval 192 ms QRS duration 106 ms QT/QTcB 486/443 ms  Echo 09/26/19 demonstrated   1. Left ventricular ejection fraction, by visual estimation, is 60 to  65%. There is mildly increased left ventricular hypertrophy.   2. Global right ventricle has normal systolic function.The right  ventricular size is normal. No increase in right ventricular wall  thickness.   3. Left atrial size was mildly dilated.   4. Right atrial size was mildly dilated.   5. Trivial pericardial effusion is present.   6. The mitral valve is normal in structure. Mild mitral valve  regurgitation. No  evidence of mitral stenosis.   7. The tricuspid valve is normal in structure. Tricuspid valve  regurgitation is mild.   8. The aortic valve is normal in structure.   9. Mildly elevated pulmonary artery systolic pressure.  10. The inferior vena cava is dilated in size with <50% respiratory  variability, suggesting right atrial pressure of 15 mmHg.   Epic records are reviewed at length today  CHA2DS2-VASc Score = 4  The patient's score is based upon: CHF History: Yes HTN History: Yes Diabetes History: No Stroke History: No Vascular Disease History: No Age Score: 2 Gender Score: 0     ASSESSMENT AND PLAN: 1. Persistent Atrial Fibrillation (ICD10:  I48.19) The patient's CHA2DS2-VASc score is 4, indicating a 4.8% annual risk of stroke.   Patient converted to SR with increased amiodarone.  We discussed therapeutic options today. He and his wife do express concern about long term side effects of amiodarone although he has done well on it for several years. He is agreeable to a consultation with EP to discuss possible ablation to come off amiodarone. Will refer.  Decrease amiodarone to 200 mg daily. Decrease Lopressor to 12.5 mg  BID Continue Eliquis 5 mg BID  2. Secondary Hypercoagulable State (ICD10:  D68.69) The patient is at significant risk for stroke/thromboembolism based upon his CHA2DS2-VASc Score of 4.  Continue Apixaban (Eliquis).   3. HTN Stable, med changes as above.  4. Systolic dysfunction EF normalized on echo 09/2019. No signs or symptoms of fluid overload.   Follow up with Dr Terrence Dupont as scheduled. Referred to EP to discuss possible ablation.    [facility] [address] [place], [place] [address] [phone] 04/09/2021 8:32 AM

## 2021-04-08 NOTE — Patient Instructions (Signed)
Decrease Amiodarone to 200mg  once a day  Decrease metoprolol to 12.5mg  twice a day (1/2 of your 25mg  tablet twice a day)

## 2021-04-09 DIAGNOSIS — I4819 Other persistent atrial fibrillation: Secondary | ICD-10-CM | POA: Insufficient documentation

## 2021-04-09 DIAGNOSIS — D6869 Other thrombophilia: Secondary | ICD-10-CM | POA: Insufficient documentation

## 2021-04-09 DIAGNOSIS — I480 Paroxysmal atrial fibrillation: Secondary | ICD-10-CM | POA: Insufficient documentation

## 2021-04-11 ENCOUNTER — Encounter: Payer: Self-pay | Admitting: *Deleted

## 2021-04-11 ENCOUNTER — Ambulatory Visit: Payer: Medicare HMO | Admitting: Internal Medicine

## 2021-04-11 ENCOUNTER — Other Ambulatory Visit: Payer: Self-pay

## 2021-04-11 ENCOUNTER — Encounter: Payer: Self-pay | Admitting: Internal Medicine

## 2021-04-11 VITALS — BP 150/72 | HR 58 | Ht 74.0 in | Wt 227.0 lb

## 2021-04-11 DIAGNOSIS — I4819 Other persistent atrial fibrillation: Secondary | ICD-10-CM

## 2021-04-11 DIAGNOSIS — D6869 Other thrombophilia: Secondary | ICD-10-CM

## 2021-04-11 NOTE — Progress Notes (Signed)
Electrophysiology Office Note   Date:  04/11/2021   ID:  Lance Hubbard, DOB 02-13-1941, MRN [id number]  PCP:  Lance Sacramento, MD  Cardiologist:  Dr Terrence Dupont Primary Electrophysiologist: Lance Grayer, MD    CC: afib   History of Present Illness: [name] is a 80 y.o. male who presents today for electrophysiology evaluation.   He is referred by Dr Terrence Dupont and Adline Peals for EP consultation.  He reports being diagnosed with afib in 2015.  He has had increasing afib since that time.  He was started on amiodarone and cardioverted.  Unfortunately, his afib returned.  + fatigue and decreased exercise tolerance with afib.  He is unaware of triggers/ precipitants.  Today, he denies symptoms of palpitations, chest pain, shortness of breath, orthopnea, PND, lower extremity edema, claudication, dizziness, presyncope, syncope, bleeding, or neurologic sequela. The patient is tolerating medications without difficulties and is otherwise without complaint today.    Past Medical History:  Diagnosis Date   Arthritis    Dysrhythmia    Atrial Fibrillation   History of kidney stones    Hypertension    Past Surgical History:  Procedure Laterality Date   CARDIOVERSION N/A 12/18/2013   Procedure: CARDIOVERSION;  Surgeon: Birdie Riddle, MD;  Location: [place];  Service: Cardiovascular;  Laterality: N/A;  9:58 Lido 40 mg, IV Propofol 40 mg, IV for anesthesia,  synched cardioversio @ 120 joules with successful NSR    COLONOSCOPY     KNEE ARTHROSCOPY     knee dsurgery     LEFT HEART CATHETERIZATION WITH CORONARY ANGIOGRAM N/A 12/19/2013   Procedure: LEFT HEART CATHETERIZATION WITH CORONARY ANGIOGRAM;  Surgeon: Clent Demark, MD;  Location: [place] CATH LAB;  Service: Cardiovascular;  Laterality: N/A;   LUMBAR LAMINECTOMY/DECOMPRESSION MICRODISCECTOMY N/A 05/17/2019   Procedure: Lumbar Four Lumbar Five Lumbar Laminectomy;  Surgeon: Jovita Gamma, MD;  Location: [place];  Service: Neurosurgery;   Laterality: N/A;  Lumbar 4 Lumbar 5 Lumbar Laminectomy   TEE WITHOUT CARDIOVERSION N/A 12/18/2013   Procedure: TRANSESOPHAGEAL ECHOCARDIOGRAM (TEE);  Surgeon: Birdie Riddle, MD;  Location[facility] ENDOSCOPY;  Service: Cardiovascular;  Laterality: N/A;     Current Outpatient Medications  Medication Sig Dispense Refill   acetaminophen (TYLENOL) 500 MG tablet Take 1,500 mg by mouth 2 (two) times a day.     amiodarone (PACERONE) 200 MG tablet Take 200 mg by mouth daily.     apixaban (ELIQUIS) 5 MG TABS tablet Take 1 tablet (5 mg total) by mouth every 12 (twelve) hours. 60 tablet 3   cyanocobalamin 1000 MCG tablet Take by mouth.     dasatinib (SPRYCEL) 50 MG tablet TAKE 1 TABLET (50 MG TOTAL) BY MOUTH DAILY. 30 tablet 11   levothyroxine (SYNTHROID) 25 MCG tablet Take 25 mcg by mouth daily.     metoprolol tartrate (LOPRESSOR) 25 MG tablet Take 12.5 mg by mouth 2 (two) times daily.     Polyethyl Glycol-Propyl Glycol (SYSTANE ULTRA) 0.4-0.3 % SOLN Place 1-2 drops into both eyes 3 (three) times daily as needed (for dry eyes).     sacubitril-valsartan (ENTRESTO) 49-51 MG 1/2 tablets in the AM, 1 tablet in PM     torsemide (DEMADEX) 20 MG tablet Take 1 tablet (20 mg total) by mouth daily as needed. 30 tablet 4   No current facility-administered medications for this visit.    Allergies:   Amoxicillin, Clavulanic acid, and Penicillin g   Social History:  The patient  reports that he  quit smoking about 2 years ago. His smoking use included pipe. He has never used smokeless tobacco. He reports previous alcohol use. He reports that he does not use drugs.   Family History:  The patient's family history includes Heart disease in his father; Hypertension in his sister.    ROS:  Please see the history of present illness.   All other systems are personally reviewed and negative.    PHYSICAL EXAM: VS:  BP (!) 150/72   Pulse (!) 58   Ht 6\' 2"  (1.88 m)   Wt 227 lb (103 kg)   SpO2 95%   BMI 29.15 kg/m  ,  BMI Body mass index is 29.15 kg/m. GEN: Well nourished, well developed, in no acute distress HEENT: normal Neck: no JVD, carotid bruits, or masses Cardiac: RRR; no murmurs, rubs, or gallops,no edema  Respiratory:  clear to auscultation bilaterally, normal work of breathing GI: soft, nontender, nondistended, + BS MS: no deformity or atrophy Skin: warm and dry  Neuro:  Strength and sensation are intact Psych: euthymic mood, full affect  EKG:  EKG is ordered today. The ekg ordered today is personally reviewed and shows sinus  Other studies personally reviewed: Additional studies/ records that were reviewed today include: Dr Zenia Resides notes, AF clinic notes, prior echo  Review of the above records today demonstrates: as above   ASSESSMENT AND PLAN:  1.  Persistent afib The patient has symptomatic, recurrent  atrial fibrillation. he has failed medical therapy with amiodarone. Chads2vasc score is at least 3.  he is anticoagulated with eliquis . Therapeutic strategies for afib including medicine and ablation were discussed in detail with the patient today. Risk, benefits, and alternatives to EP study and radiofrequency ablation for afib were also discussed in detail today. These risks include but are not limited to stroke, bleeding, vascular damage, tamponade, perforation, damage to the esophagus, lungs, and other structures, pulmonary vein stenosis, worsening renal function, and death. The patient understands these risk and wishes to proceed.  We will therefore proceed with catheter ablation at the next available time.  Carto, ICE, anesthesia are requested for the procedure.  Will also obtain cardiac CT prior to the procedure to exclude LAA thrombus and further evaluate atrial anatomy.   Risks, benefits and potential toxicities for medications prescribed and/or refilled reviewed with patient today.    2. HTN Stable No change required today   Signed, Lance Grayer, MD  04/11/2021 10:55  AM     [facility] [address] [place] [address] [phone] (office) [phone] (fax)

## 2021-04-11 NOTE — Patient Instructions (Addendum)
Medication Instructions:  Your physician recommends that you continue on your current medications as directed. Please refer to the Current Medication list given to you today.  Labwork: None ordered.  Testing/Procedures: Your physician has requested that you have cardiac CT. Cardiac computed tomography (CT) is a painless test that uses an x-ray machine to take clear, detailed pictures of your heart. For further information please visit [employer]. Please follow instruction sheet as given.  Your physician has recommended that you have an ablation. Catheter ablation is a medical procedure used to treat some cardiac arrhythmias (irregular heartbeats). During catheter ablation, a long, thin, flexible tube is put into a blood vessel in your groin (upper thigh), or neck. This tube is called an ablation catheter. It is then guided to your heart through the blood vessel. Radio frequency waves destroy small areas of heart tissue where abnormal heartbeats may cause an arrhythmia to start. Please see the instruction sheet given to you today.   Any Other Special Instructions Will Be Listed Below (If Applicable).  If you need a refill on your cardiac medications before your next appointment, please call your pharmacy.     Cardiac Ablation Cardiac ablation is a procedure to destroy (ablate) some heart tissue that is sending bad signals. These bad signals causeproblems in heart rhythm. The heart has many areas that make these signals. If there are problems in these areas, they can make the heart beat in a way that is not normal.Destroying some tissues can help make the heart rhythm normal. Tell your doctor about: Any allergies you have. All medicines you are taking. These include vitamins, herbs, eye drops, creams, and over-the-counter medicines. Any problems you or family members have had with medicines that make you fall asleep (anesthetics). Any blood disorders you have. Any surgeries you have  had. Any medical conditions you have, such as kidney failure. Whether you are pregnant or may be pregnant. What are the risks? This is a safe procedure. But problems may occur, including: Infection. Bruising and bleeding. Bleeding into the chest. Stroke or blood clots. Damage to nearby areas of your body. Allergies to medicines or dyes. The need for a pacemaker if the normal system is damaged. Failure of the procedure to treat the problem. What happens before the procedure? Medicines Ask your doctor about: Changing or stopping your normal medicines. This is important. Taking aspirin and ibuprofen. Do not take these medicines unless your doctor tells you to take them. Taking other medicines, vitamins, herbs, and supplements. General instructions Follow instructions from your doctor about what you cannot eat or drink. Plan to have someone take you home from the hospital or clinic. If you will be going home right after the procedure, plan to have someone with you for 24 hours. Ask your doctor what steps will be taken to prevent infection. What happens during the procedure?  An IV tube will be put into one of your veins. You will be given a medicine to help you relax. The skin on your neck or groin will be numbed. A cut (incision) will be made in your neck or groin. A needle will be put through your cut and into a large vein. A tube (catheter) will be put into the needle. The tube will be moved to your heart. Dye may be put through the tube. This helps your doctor see your heart. Small devices (electrodes) on the tube will send out signals. A type of energy will be used to destroy some heart tissue. The tube  will be taken out. Pressure will be held on your cut. This helps stop bleeding. A bandage will be put over your cut. The exact procedure may vary among doctors and hospitals. What happens after the procedure? You will be watched until you leave the hospital or clinic. This  includes checking your heart rate, breathing rate, oxygen, and blood pressure. Your cut will be watched for bleeding. You will need to lie still for a few hours. Do not drive for 24 hours or as long as your doctor tells you. Summary Cardiac ablation is a procedure to destroy some heart tissue. This is done to treat heart rhythm problems. Tell your doctor about any medical conditions you may have. Tell him or her about all medicines you are taking to treat them. This is a safe procedure. But problems may occur. These include infection, bruising, bleeding, and damage to nearby areas of your body. Follow what your doctor tells you about food and drink. You may also be told to change or stop some of your medicines. After the procedure, do not drive for 24 hours or as long as your doctor tells you. This information is not intended to replace advice given to you by your health care provider. Make sure you discuss any questions you have with your healthcare provider. Document Revised: 09/14/2019 Document Reviewed: 09/14/2019 Elsevier Patient Education  2022 [employer].

## 2021-04-16 ENCOUNTER — Encounter (HOSPITAL_BASED_OUTPATIENT_CLINIC_OR_DEPARTMENT_OTHER): Payer: Self-pay | Admitting: Physical Therapy

## 2021-04-16 ENCOUNTER — Other Ambulatory Visit: Payer: Self-pay

## 2021-04-16 ENCOUNTER — Ambulatory Visit (HOSPITAL_BASED_OUTPATIENT_CLINIC_OR_DEPARTMENT_OTHER): Payer: Medicare HMO | Attending: Sports Medicine | Admitting: Physical Therapy

## 2021-04-16 DIAGNOSIS — R2689 Other abnormalities of gait and mobility: Secondary | ICD-10-CM | POA: Diagnosis not present

## 2021-04-16 DIAGNOSIS — R2681 Unsteadiness on feet: Secondary | ICD-10-CM | POA: Insufficient documentation

## 2021-04-16 DIAGNOSIS — I480 Paroxysmal atrial fibrillation: Secondary | ICD-10-CM | POA: Diagnosis not present

## 2021-04-16 DIAGNOSIS — M6281 Muscle weakness (generalized): Secondary | ICD-10-CM | POA: Insufficient documentation

## 2021-04-16 DIAGNOSIS — E039 Hypothyroidism, unspecified: Secondary | ICD-10-CM | POA: Diagnosis not present

## 2021-04-16 DIAGNOSIS — E785 Hyperlipidemia, unspecified: Secondary | ICD-10-CM | POA: Diagnosis not present

## 2021-04-16 DIAGNOSIS — I10 Essential (primary) hypertension: Secondary | ICD-10-CM | POA: Diagnosis not present

## 2021-04-16 DIAGNOSIS — I509 Heart failure, unspecified: Secondary | ICD-10-CM | POA: Diagnosis not present

## 2021-04-16 NOTE — Therapy (Signed)
[facility] [address] [place], [place], [address] Phone: [phone]   Fax:  [phone]  Physical Therapy Evaluation  Patient Details  Name: Lance Hubbard MRN: [id number] Date of Birth: May 26, 1941 Referring Provider (PT): Dr Elie Confer   Encounter Date: 04/16/2021   PT End of Session - 04/16/21 1257     Visit Number 1    Number of Visits 12    Date for PT Re-Evaluation 05/28/21    Authorization Type Medicare Progress note at 10 Kx at 13    PT Start Time 1145    PT Stop Time 1230    PT Time Calculation (min) 45 min    Activity Tolerance Patient tolerated treatment well    Behavior During Therapy [facility] for tasks assessed/performed             Past Medical History:  Diagnosis Date   Arthritis    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   History of kidney stones    Hypertension    Persistent atrial fibrillation (HCC)    Atrial Fibrillation    Past Surgical History:  Procedure Laterality Date   CARDIOVERSION N/A 12/18/2013   Procedure: CARDIOVERSION;  Surgeon: Birdie Riddle, MD;  Location: [facility];  Service: Cardiovascular;  Laterality: N/A;  9:58 Lido 40 mg, IV Propofol 40 mg, IV for anesthesia,  synched cardioversio @ 120 joules with successful NSR    COLONOSCOPY     KNEE ARTHROSCOPY     knee dsurgery     LEFT HEART CATHETERIZATION WITH CORONARY ANGIOGRAM N/A 12/19/2013   Procedure: LEFT HEART CATHETERIZATION WITH CORONARY ANGIOGRAM;  Surgeon: Clent Demark, MD;  Location: [facility] CATH LAB;  Service: Cardiovascular;  Laterality: N/A;   LUMBAR LAMINECTOMY/DECOMPRESSION MICRODISCECTOMY N/A 05/17/2019   Procedure: Lumbar Four Lumbar Five Lumbar Laminectomy;  Surgeon: Jovita Gamma, MD;  Location: [facility];  Service: Neurosurgery;  Laterality: N/A;  Lumbar 4 Lumbar 5 Lumbar Laminectomy   TEE WITHOUT CARDIOVERSION N/A 12/18/2013   Procedure: TRANSESOPHAGEAL ECHOCARDIOGRAM (TEE);  Surgeon: Birdie Riddle, MD;  Location: [facility];  Service: Cardiovascular;  Laterality: N/A;    There were no vitals filed for this visit.    Subjective Assessment - 04/16/21 1152     Subjective Patient has a history of LE weakness that began 2 years ago folloing his low back surgery. He feels like over the past few months he has had weakness in his legs.  He has joined the gym but then had an episode of A-fib. His heart is currently in rythm but he has an abilation scheduled on 05/29/2021. He has minor discomfort in his back  at this time.    Patient is accompained by: Family member    Pertinent History A-fib    Limitations Standing;Walking;House hold activities    How long can you sit comfortably? N/A    How long can you walk comfortably? limited walking dsitance. Was walking a mile    Patient Stated Goals to be able to walk more    Currently in Pain? No/denies   Pain at times when he perfroms high level activity. Only lasts ablut 5 minutes               [facility] PT Assessment - 04/16/21 0001       Assessment   Medical Diagnosis Genral Weakness    Referring Provider (PT) Dr Elie Confer    Onset Date/Surgical Date --   > 2 years  but worsening   Hand Dominance Right    Next MD Visit 1 month from the start of PT    Prior Therapy PT for his back      Precautions   Precautions Other (comment)    Precaution Comments A-fib      Restrictions   Weight Bearing Restrictions No      Balance Screen   Has the patient fallen in the past 6 months No    Has the patient had a decrease in activity level because of a fear of falling?  No    Is the patient reluctant to leave their home because of a fear of falling?  No      Home Environment   Living Environment Private residence    Additional Comments 4 steps into the house. Uses the handrail. 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 Patient reports his baseline never goes below 50. Patient aysymtomatic      Sensation   Light Touch Appears Intact    Additional Comments tingling in his feet at times when he sits down      Coordination   Gross Motor Movements are Fluid and Coordinated Yes    Fine Motor Movements are Fluid and Coordinated Yes      ROM / Strength   AROM / PROM / Strength PROM;AROM;Strength      PROM   PROM Assessment Site Hip;Ankle;Knee    Right/Left Hip Right;Left    Right/Left Knee Right;Left      Strength   Strength Assessment Site Hip;Hand;Knee    Right/Left hand Right;Left    Right Hand Grip (lbs) 80    Left Hand Grip (lbs) 80    Right/Left Hip Right;Left    Right Hip Flexion 4+/5    Right Hip ABduction 5/5    Right Hip ADduction 5/5    Left Hip Flexion 4+/5    Left Hip ABduction 5/5    Left Hip ADduction 5/5    Right/Left Knee Left;Right      Ambulation/Gait   Gait Comments mild forward flexion of the trunk                        Objective measurements completed on examination: See above findings.               PT Education - 04/16/21 1255     Education Details Symptoms of cardiac dysfucntion; monitoring vitals with activity; improving balance    Person(s) Educated Patient;Spouse    Methods Explanation;Demonstration;Verbal cues    Comprehension Verbalized understanding;Returned demonstration;Verbal cues required;Need further instruction              PT Short Term Goals - 04/16/21 1415       PT SHORT TERM GOAL #1   Title Patient will improve tandem stance balance to contact gaurd    Time 3    Period Weeks    Status  New  Target Date 05/07/21      PT SHORT TERM GOAL #2   Title Patient will improve hip flexion strength to 5/5    Time 3    Period Weeks    Status New    Target Date 05/07/21      PT SHORT TERM GOAL #3   Title Patient will be indepdent with basic HEP for strength and balance    Time 3    Period Weeks    Status New    Target Date 05/07/21               PT Long Term Goals - 04/16/21 1418       PT LONG TERM GOAL #1   Title Patient will ambualte a mile without self reported fatigue or loss of balance    Time 6    Period Weeks    Status New    Target Date 05/28/21      PT LONG TERM GOAL #2   Title Patient will go up and down 4 steps without increased dyspnea    Time 6    Period Weeks    Status New    Target Date 05/28/21      PT LONG TERM GOAL #3   Title Patient will be indepdnent with full gym program    Time 6    Period Weeks    Status New    Target Date 05/28/21                    Plan - 04/16/21 1356     Clinical Impression Statement Patient is a 80 year male with a 2 year history of progressive weakness with functional activity following a low back decmpression. He feels like his balance is getting progressivley worse. He presents with balance defecits and minor strength deficits. He reports decreased cardiovascualr endurance. Therapy unable to test today. In previous MD note MD stated patient needs to be cleared by cardiology. Edurance testing not perfromed this visit. He would benefit from skilled therapy to improve endurance, ability to walk, and to develope a plan to go back to the gym.    Personal Factors and Comorbidities Comorbidity 1;Comorbidity 2;Comorbidity 3+    Comorbidities a-fib; low back surgery; Leukemia    Examination-Activity Limitations Locomotion Level;Stairs;Lift;Bend    Examination-Participation Restrictions Shop;Laundry;Yard Work;Community Activity    Stability/Clinical Decision Making Evolving/Moderate complexity    Clinical  Decision Making Moderate    Rehab Potential Good    PT Frequency 2x / week    PT Duration 6 weeks    PT Treatment/Interventions ADLs/Self Care Home Management;Aquatic Therapy;Gait training;Stair training;Functional mobility training;Therapeutic activities;Therapeutic exercise;Balance training;Neuromuscular re-education;Patient/family education;Manual techniques    PT Next Visit Plan Patient needs cardiac clearance before treatment. If given prform 5x sit to stand and 6 min walk with subsequent goals. Nu-step or bike; Genral LE band exercises; Work on Hotel manager. Mod a required for tandem stance    PT Home Exercise Plan No HEP given 2nd to patient needling cardiac clearance    Consulted and Agree with Plan of Care Patient             Patient will benefit from skilled therapeutic intervention in order to improve the following deficits and impairments:  Difficulty walking, Decreased endurance, Cardiopulmonary status limiting activity, Decreased activity tolerance, Decreased mobility, Decreased strength  Visit Diagnosis: Other abnormalities of gait and mobility  Muscle weakness (generalized)  Unsteadiness on feet     Problem List Patient Active  Problem List   Diagnosis Date Noted   Persistent atrial fibrillation ([place]) 04/09/2021   Secondary hypercoagulable state ([place]) 04/09/2021   Weakness of both lower extremities 04/01/2021   Constipation 07/20/2019   Normocytic anemia 06/29/2019   Heart failure with reduced ejection fraction ([place]) 06/29/2019   Goals of care, counseling/discussion 06/09/2019   Lumbar stenosis with neurogenic claudication 05/17/2019   CML (chronic myelocytic leukemia) ([place]) 05/08/2019   HTN (hypertension) 12/16/2013   Atrial fibrillation with RVR ([place]) 12/16/2013    Carney Living PT DPT  04/16/2021, 2:40 PM   [facility] [address] [place], [place], [address] Phone: [phone]   Fax:   [phone]  Name: Lance Hubbard MRN: [id number] Date of Birth: 08/21/1941

## 2021-04-16 NOTE — Patient Instructions (Signed)
Did not give 2nd to cardiac restrictions

## 2021-04-17 ENCOUNTER — Telehealth (HOSPITAL_BASED_OUTPATIENT_CLINIC_OR_DEPARTMENT_OTHER): Payer: Self-pay | Admitting: Physical Therapy

## 2021-04-17 NOTE — Telephone Encounter (Signed)
Therapy received verbal consent to treat patient within tolerance for PT from Dr Charolette Forward

## 2021-04-21 ENCOUNTER — Other Ambulatory Visit: Payer: Self-pay

## 2021-04-21 ENCOUNTER — Ambulatory Visit (HOSPITAL_BASED_OUTPATIENT_CLINIC_OR_DEPARTMENT_OTHER): Payer: Medicare HMO | Admitting: Physical Therapy

## 2021-04-21 ENCOUNTER — Encounter (HOSPITAL_BASED_OUTPATIENT_CLINIC_OR_DEPARTMENT_OTHER): Payer: Self-pay | Admitting: Physical Therapy

## 2021-04-21 DIAGNOSIS — R2689 Other abnormalities of gait and mobility: Secondary | ICD-10-CM | POA: Diagnosis not present

## 2021-04-21 DIAGNOSIS — M6281 Muscle weakness (generalized): Secondary | ICD-10-CM

## 2021-04-21 DIAGNOSIS — R2681 Unsteadiness on feet: Secondary | ICD-10-CM | POA: Diagnosis not present

## 2021-04-21 NOTE — Therapy (Signed)
[facility] [place], [place], [address] Phone: [phone]   Fax:  [phone]  Physical Therapy Treatment  Patient Details  Name: Lance Hubbard MRN: [id number] Date of Birth: 1941/05/13 Referring Provider (PT): Dr Elie Confer   Encounter Date: 04/21/2021   PT End of Session - 04/21/21 1614     Visit Number 2    Number of Visits 12    Date for PT Re-Evaluation 05/28/21    Authorization Type Medicare Progress note at 10 Kx at 60    PT Start Time 1018    PT Stop Time 1105    PT Time Calculation (min) 47 min    Activity Tolerance Patient tolerated treatment well    Behavior During Therapy [facility] for tasks assessed/performed             Past Medical History:  Diagnosis Date   Arthritis    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   History of kidney stones    Hypertension    Persistent atrial fibrillation ([place])    Atrial Fibrillation    Past Surgical History:  Procedure Laterality Date   CARDIOVERSION N/A 12/18/2013   Procedure: CARDIOVERSION;  Surgeon: Birdie Riddle, MD;  Location: [facility];  Service: Cardiovascular;  Laterality: N/A;  9:58 Lido 40 mg, IV Propofol 40 mg, IV for anesthesia,  synched cardioversio @ 120 joules with successful NSR    COLONOSCOPY     KNEE ARTHROSCOPY     knee dsurgery     LEFT HEART CATHETERIZATION WITH CORONARY ANGIOGRAM N/A 12/19/2013   Procedure: LEFT HEART CATHETERIZATION WITH CORONARY ANGIOGRAM;  Surgeon: Clent Demark, MD;  Location: [facility] CATH LAB;  Service: Cardiovascular;  Laterality: N/A;   LUMBAR LAMINECTOMY/DECOMPRESSION MICRODISCECTOMY N/A 05/17/2019   Procedure: Lumbar Four Lumbar Five Lumbar Laminectomy;  Surgeon: Jovita Gamma, MD;  Location: [facility];  Service: Neurosurgery;  Laterality: N/A;  Lumbar 4 Lumbar 5 Lumbar Laminectomy   TEE WITHOUT CARDIOVERSION N/A 12/18/2013   Procedure: TRANSESOPHAGEAL ECHOCARDIOGRAM (TEE);  Surgeon: Birdie Riddle, MD;  Location: [facility];  Service: Cardiovascular;  Laterality: N/A;    There were no vitals filed for this visit.   Subjective Assessment - 04/21/21 1320     Subjective Pt states he has no pain. He states his main issues are weakness and balance. Pt denies any adverse effects after prior Rx. Pt states he was walking 1 mile per day until the weather turned hot. He has not been walking recently due to the weather. He c/o'd of weakness in bilat thighs/hips with his walking program. He denies any falls. BP: 153/81. HR: 48. Pt states that's what his normal BP has been lately and has been running a little high since his last A-fib issue. He states he is on meds to reduce HR currently. Pt states he has been using a recumbent bike the past 5-6 days 10 mins at a time. He denies any issues with using the recumbent bike.    Currently in Pain? No/denies                [facility] PT Assessment - 04/21/21 0001       Transfers   Five time sit to stand comments  19 seconds.  Decreased eccentric control with stand to sit. He required UE assist on the chair arms for the last 2 reps.      6 minute walk test results    Endurance additional comments  1,120 ft/(341.4 m).   Pt ambulated entire 6 minutes without stopping.                           [facility] PT Treatment/Exercise - 04/21/21 0001       Therapeutic Activites    Therapeutic Activities Other Therapeutic Activities   Performed 6 MWT and 5 times sit to stand test.  Explained pt on the purpose, rationale, and how to perform     Neuro Re-ed    Neuro Re-ed Details  Marching on Airex 2x10 reps with UE assistance on rails.  Standing on Airex with FA with SBA 3x30 sec      Exercises   Exercises Knee/Hip      Knee/Hip Exercises: Standing   Heel Raises 2 sets;10 reps    Heel Raises Limitations with bilat UE assist; Pt very limited with height of heel raise      Knee/Hip Exercises: Seated   Long Arc Quad 2 sets;10 reps     Long Arc Quad Limitations RTB      Knee/Hip Exercises: Supine   Straight Leg Raises 2 sets;10 reps    Straight Leg Raises Limitations bilat    Other Supine Knee/Hip Exercises supine clams with GTB 2x10 reps                    PT Education - 04/21/21 1146     Education Details Educated pt to work on sit/stand transfers including slowly lowering himself to chairs. Pt received a HEP handout and was educated in correct form and appropriate frequency.  Access Code: [email or web address] URL: [email or web address] Date: 04/21/2021 Prepared by: Ronny Flurry Exercises Supine Active Straight Leg Raise - 1 x daily - 5-6 x weekly - 2 sets - 10 reps Hooklying Clamshell with Resistance - 1 x daily - 4 - 5 x weekly - 2 sets - 10 reps Seated Knee Extension with Resistance - 1 x daily - 3-5 x weekly - 2 - 3 sets - 10 reps    Person(s) Educated Patient    Methods Explanation;Handout;Verbal cues    Comprehension Verbalized understanding;Returned demonstration              PT Short Term Goals - 04/16/21 1415       PT SHORT TERM GOAL #1   Title Patient will improve tandem stance balance to contact gaurd    Time 3    Period Weeks    Status New    Target Date 05/07/21      PT SHORT TERM GOAL #2   Title Patient will improve hip flexion strength to 5/5    Time 3    Period Weeks    Status New    Target Date 05/07/21      PT SHORT TERM GOAL #3   Title Patient will be indepdent with basic HEP for strength and balance    Time 3    Period Weeks    Status New    Target Date 05/07/21               PT Long Term Goals - 04/16/21 1418       PT LONG TERM GOAL #1   Title Patient will ambualte a mile without self reported fatigue or loss of balance    Time 6    Period Weeks    Status New    Target Date 05/28/21      PT LONG TERM GOAL #  2   Title Patient will go up and down 4 steps without increased dyspnea    Time 6    Period Weeks    Status New    Target Date 05/28/21       PT LONG TERM GOAL #3   Title Patient will be indepdnent with full gym program    Time 6    Period Weeks    Status New    Target Date 05/28/21                   Plan - 04/21/21 1134     Clinical Impression Statement PT has recevied clearance from cardiologist to begin PT treatment per phone conversation after eval.  Pt demonstrates LE functional weakness as evidenced by performance of 5 times sit to stand test.  He has difficulty with performing sit to stands including slowly lowering himself to the chair.  Pt was able to perform the entire 6 MWT without resting today and without LOB.  Pt's HR did increase though was not abnormal.  Pt gave good effort with all exercises and required cuing for correct form including to maintain knee straight with supine SLR.  Pt is very limited with height with standing heel raises.  He had no pain with exercises.  Pt demonstrates understanding of HEP.  Pt responded well to Rx having no c/o's after Rx.  He had no significant fatigue and stated he felt good after Rx.    PT Treatment/Interventions ADLs/Self Care Home Management;Aquatic Therapy;Gait training;Stair training;Functional mobility training;Therapeutic activities;Therapeutic exercise;Balance training;Neuromuscular re-education;Patient/family education;Manual techniques    PT Next Visit Plan PT has received clearance from cardiologist per phone conversation after eval.  Nu-step or bike.  Progress LE strengthening and balance training.  Review and perform HEP.  continue to monitor 6 MWT and 5 times sit to stand.  Add step ups next visit.    PT Home Exercise Plan Access Code: [email or web address]  URL: [email or web address]  Date: 04/21/2021  Prepared by: Ronny Flurry    Exercises  Supine Active Straight Leg Raise - 1 x daily - 6-7 x weekly - 2 sets - 10 reps  Hooklying Clamshell with Resistance - 1 x daily - 4 - 5 x weekly - 2 sets - 10 reps  Seated Knee Extension with Resistance - 1 x daily - 3-5  x weekly - 2 - 3 sets - 10 reps             Patient will benefit from skilled therapeutic intervention in order to improve the following deficits and impairments:  Difficulty walking, Decreased endurance, Cardiopulmonary status limiting activity, Decreased activity tolerance, Decreased mobility, Decreased strength  Visit Diagnosis: Muscle weakness (generalized)  Unsteadiness on feet  Other abnormalities of gait and mobility     Problem List Patient Active Problem List   Diagnosis Date Noted   Persistent atrial fibrillation ([place]) 04/09/2021   Secondary hypercoagulable state ([place]) 04/09/2021   Weakness of both lower extremities 04/01/2021   Constipation 07/20/2019   Normocytic anemia 06/29/2019   Heart failure with reduced ejection fraction ([place]) 06/29/2019   Goals of care, counseling/discussion 06/09/2019   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 [place], [place], [address] Phone: [phone]   Fax:  [phone]  Name: Lance Hubbard  MRN: [id number] Date of Birth: [birth year]

## 2021-04-23 DIAGNOSIS — L723 Sebaceous cyst: Secondary | ICD-10-CM | POA: Diagnosis not present

## 2021-04-23 DIAGNOSIS — I10 Essential (primary) hypertension: Secondary | ICD-10-CM | POA: Diagnosis not present

## 2021-04-24 ENCOUNTER — Encounter (HOSPITAL_BASED_OUTPATIENT_CLINIC_OR_DEPARTMENT_OTHER): Payer: Self-pay | Admitting: Physical Therapy

## 2021-04-24 ENCOUNTER — Other Ambulatory Visit: Payer: Self-pay

## 2021-04-24 ENCOUNTER — Ambulatory Visit (HOSPITAL_BASED_OUTPATIENT_CLINIC_OR_DEPARTMENT_OTHER): Payer: Medicare HMO | Admitting: Physical Therapy

## 2021-04-24 DIAGNOSIS — R2681 Unsteadiness on feet: Secondary | ICD-10-CM | POA: Diagnosis not present

## 2021-04-24 DIAGNOSIS — M6281 Muscle weakness (generalized): Secondary | ICD-10-CM

## 2021-04-24 DIAGNOSIS — R2689 Other abnormalities of gait and mobility: Secondary | ICD-10-CM | POA: Diagnosis not present

## 2021-04-24 NOTE — Therapy (Signed)
[facility] [address] [place], [place], [address] Phone: [phone]   Fax:  [phone]  Physical Therapy Treatment  Patient Details  Name: Lance Hubbard MRN: [id number] Date of Birth: 03/26/1941 Referring Provider (PT): Dr Elie Confer   Encounter Date: 04/24/2021   PT End of Session - 04/24/21 1521     Visit Number 3    Number of Visits 12    Date for PT Re-Evaluation 05/28/21    Authorization Type Medicare Progress note at 10 Kx at 46    PT Start Time 1100    PT Stop Time 1146    PT Time Calculation (min) 46 min    Activity Tolerance Patient tolerated treatment well    Behavior During Therapy [facility] for tasks assessed/performed             Past Medical History:  Diagnosis Date   Arthritis    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   History of kidney stones    Hypertension    Persistent atrial fibrillation ([place])    Atrial Fibrillation    Past Surgical History:  Procedure Laterality Date   CARDIOVERSION N/A 12/18/2013   Procedure: CARDIOVERSION;  Surgeon: Birdie Riddle, MD;  Location: [facility];  Service: Cardiovascular;  Laterality: N/A;  9:58 Lido 40 mg, IV Propofol 40 mg, IV for anesthesia,  synched cardioversio @ 120 joules with successful NSR    COLONOSCOPY     KNEE ARTHROSCOPY     knee dsurgery     LEFT HEART CATHETERIZATION WITH CORONARY ANGIOGRAM N/A 12/19/2013   Procedure: LEFT HEART CATHETERIZATION WITH CORONARY ANGIOGRAM;  Surgeon: Clent Demark, MD;  Location: [facility] CATH LAB;  Service: Cardiovascular;  Laterality: N/A;   LUMBAR LAMINECTOMY/DECOMPRESSION MICRODISCECTOMY N/A 05/17/2019   Procedure: Lumbar Four Lumbar Five Lumbar Laminectomy;  Surgeon: Jovita Gamma, MD;  Location: [facility];  Service: Neurosurgery;  Laterality: N/A;  Lumbar 4 Lumbar 5 Lumbar Laminectomy   TEE WITHOUT CARDIOVERSION N/A 12/18/2013   Procedure: TRANSESOPHAGEAL ECHOCARDIOGRAM (TEE);  Surgeon: Birdie Riddle, MD;  Location: [facility];  Service: Cardiovascular;  Laterality: N/A;    There were no vitals filed for this visit.   Subjective Assessment - 04/24/21 1101     Subjective Pt denies any adverse effects after prior Rx.  Pt states he wasn't fatigued after Rx.  Pt has been riding the recumbent bike daily.  Yesterday he was able to stand from his chair without using his hands which is the first time.  He is also able to stand from his recliner without using his hands.  "I am an active person.  My main problem is balance".  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  Pt states his BP has been a little high since his last A-fib issue and his meds were changed.  Pt states he is going to call MD this afternoon.  Pt reports having no issues with A-fib.  He checked his HR before coming to PT and his HR was 49.  Pt reports compliance with HEP without any adverse effects.    Pertinent History A-fib    How long can you walk comfortably? limited walking dsitance. Was walking a mile    Patient Stated Goals to be able to walk more.  to improve balance    Currently in Pain? No/denies  [facility] [facility] Adult PT Treatment/Exercise - 04/24/21 0001       Transfers   Transfers Sit to Stand    Comments Sit to stand:  2x10 reps without UEs on high/low table with table elevated.  pt required cuing for correct form.      Therapeutic Activites    Therapeutic Activities Other Therapeutic Activities   Performed sit to stand transfers as documented above.  Pt also performed step ups on a 4 inch step x 10 reps bilat and 6 inch step 2x10 bilat with bilat UEs on rail.  lateral step ups on a 4 inch step with bilat UEs on rail.     Neuro Re-ed    Neuro Re-ed Details  Marching on Airex 2x10 reps with UE assistance on rails.  Standing on Airex with FA with SBA 2x30 sec and with FT 2x30 sec  with SBA with intermittent usage of UE on rails.  Step ups on airex x 10 reps bilat with bilat UE assist.  Tandem stance 2x20 sec bilat with bilat UE assistance. Sidestepping in bars with SBA inside parallel bars.      Exercises   Other Exercises  Reviewed response to prior Rx, pt presentation, pain level, current function, and HEP compliance. Ther ex performed to improve strength, function, and mobility.      Knee/Hip Exercises: Aerobic   Nustep L4 x 5 mins      Knee/Hip Exercises: Seated   Long Arc Quad 2 sets;10 reps    Long Arc Quad Limitations RTB      Knee/Hip Exercises: Supine   Straight Leg Raises 2 sets;10 reps    Straight Leg Raises Limitations bilat    Other Supine Knee/Hip Exercises supine clams with GTB 3 x10 reps                    PT Education - 04/24/21 1210     Education Details Answered Pt's questions.  Educated pt with rationale for performing exercises and therapeutic activities.  Instruction in correct form with sit/stands including controlling descent and slowly lowering. Pt required cuing t/o Rx to improve erect posture and to look up.    Person(s) Educated Patient    Methods Explanation;Demonstration;Verbal cues    Comprehension Verbalized understanding;Returned demonstration              PT Short Term Goals - 04/16/21 1415       PT SHORT TERM GOAL #1   Title Patient will improve tandem stance balance to contact gaurd    Time 3    Period Weeks    Status New    Target Date 05/07/21      PT SHORT TERM GOAL #2   Title Patient will improve hip flexion strength to 5/5    Time 3    Period Weeks    Status New    Target Date 05/07/21      PT SHORT TERM GOAL #3   Title Patient will be indepdent with basic HEP for strength and balance    Time 3    Period Weeks    Status New    Target Date 05/07/21               PT Long Term Goals - 04/16/21 1418       PT LONG TERM GOAL #1   Title Patient will ambualte a mile without self  reported fatigue or loss of balance    Time 6    Period Weeks  Status New    Target Date 05/28/21      PT LONG TERM GOAL #2   Title Patient will go up and down 4 steps without increased dyspnea    Time 6    Period Weeks    Status New    Target Date 05/28/21      PT LONG TERM GOAL #3   Title Patient will be indepdnent with full gym program    Time 6    Period Weeks    Status New    Target Date 05/28/21                   Plan - 04/24/21 1213     Clinical Impression Statement Pt presents to Rx reporting improved performance of sit to stand transfers stating he is able to stand up from different chairs in his home without UE assistance.  Pt has quad weakness as evidenced by performance of supine SLR having extensor lag with fatigue.  Pt requires cuing to control descent and slowly lower to table performing sit to stands.  Pt required cuing to keep knee extended with supine SLR.  Pt requires intermittent cuing to stand up straigth t/o Rx.  Pt does have balance deficits as evidenced by performance of balance exercises and requires UE assistance with the majority of standing exercises.  He responded well to Rx having no c/o's after Rx and denying any excessive fatigue.  Pt should benefit from cont skilled PT services to address goals, improve mobility, and to restore PLOF.    Comorbidities a-fib; low back surgery; Leukemia    PT Treatment/Interventions ADLs/Self Care Home Management;Aquatic Therapy;Gait training;Stair training;Functional mobility training;Therapeutic activities;Therapeutic exercise;Balance training;Neuromuscular re-education;Patient/family education;Manual techniques    PT Next Visit Plan PT has received clearance from cardiologist per phone conversation after eval.  Nu-step or bike.  Progress LE strengthening and balance training.  Review and perform HEP.  continue to monitor 6 MWT and 5 times sit to stand.  work on proper form with sit to stands.    PT Home Exercise  Plan Access Code: [email or web address]    Consulted and Agree with Plan of Care Patient             Patient will benefit from skilled therapeutic intervention in order to improve the following deficits and impairments:  Difficulty walking, Decreased endurance, Cardiopulmonary status limiting activity, Decreased activity tolerance, Decreased mobility, Decreased strength  Visit Diagnosis: Muscle weakness (generalized)  Unsteadiness on feet  Other abnormalities of gait and mobility     Problem List Patient Active Problem List   Diagnosis Date Noted   Persistent atrial fibrillation ([place]) 04/09/2021   Secondary hypercoagulable state ([place]) 04/09/2021   Weakness of both lower extremities 04/01/2021   Constipation 07/20/2019   Normocytic anemia 06/29/2019   Heart failure with reduced ejection fraction ([place]) 06/29/2019   Goals of care, counseling/discussion 06/09/2019   Lumbar stenosis with neurogenic claudication 05/17/2019   CML (chronic myelocytic leukemia) ([place]) 05/08/2019   HTN (hypertension) 12/16/2013   Atrial fibrillation with RVR ([place]) 12/16/2013    Selinda Michaels III PT, DPT 04/24/21 3:33 PM   [facility] [address] [place], [place], [address] Phone: [phone]   Fax:  [phone]  Name: Lance Hubbard MRN: [id number] Date of Birth: 1941/04/20

## 2021-04-25 DIAGNOSIS — L720 Epidermal cyst: Secondary | ICD-10-CM | POA: Diagnosis not present

## 2021-04-29 ENCOUNTER — Other Ambulatory Visit: Payer: Self-pay

## 2021-04-29 ENCOUNTER — Ambulatory Visit (HOSPITAL_BASED_OUTPATIENT_CLINIC_OR_DEPARTMENT_OTHER): Payer: Medicare HMO | Attending: Sports Medicine | Admitting: Physical Therapy

## 2021-04-29 ENCOUNTER — Encounter (HOSPITAL_BASED_OUTPATIENT_CLINIC_OR_DEPARTMENT_OTHER): Payer: Self-pay | Admitting: Physical Therapy

## 2021-04-29 VITALS — HR 50

## 2021-04-29 DIAGNOSIS — M6281 Muscle weakness (generalized): Secondary | ICD-10-CM | POA: Diagnosis not present

## 2021-04-29 DIAGNOSIS — R2681 Unsteadiness on feet: Secondary | ICD-10-CM | POA: Diagnosis not present

## 2021-04-29 DIAGNOSIS — R2689 Other abnormalities of gait and mobility: Secondary | ICD-10-CM | POA: Diagnosis not present

## 2021-04-29 NOTE — Therapy (Signed)
[facility] [address] [place], [place], [address] Phone: [phone]   Fax:  [phone]  Physical Therapy Treatment  Patient Details  Name: Lance Hubbard MRN: [id number] Date of Birth: [birth year] Referring Provider (PT): Dr Elie Confer   Encounter Date: 04/29/2021   PT End of Session - 04/29/21 2053     Visit Number 4    Number of Visits 12    Date for PT Re-Evaluation 05/28/21    Authorization Type Medicare Progress note at 10 Kx at 42    PT Start Time 1015    PT Stop Time 1103    PT Time Calculation (min) 48 min    Activity Tolerance Patient tolerated treatment well    Behavior During Therapy [facility] for tasks assessed/performed             Past Medical History:  Diagnosis Date   Arthritis    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   History of kidney stones    Hypertension    Persistent atrial fibrillation ([place])    Atrial Fibrillation    Past Surgical History:  Procedure Laterality Date   CARDIOVERSION N/A 12/18/2013   Procedure: CARDIOVERSION;  Surgeon: Birdie Riddle, MD;  Location: [facility];  Service: Cardiovascular;  Laterality: N/A;  9:58 Lido 40 mg, IV Propofol 40 mg, IV for anesthesia,  synched cardioversio @ 120 joules with successful NSR    COLONOSCOPY     KNEE ARTHROSCOPY     knee dsurgery     LEFT HEART CATHETERIZATION WITH CORONARY ANGIOGRAM N/A 12/19/2013   Procedure: LEFT HEART CATHETERIZATION WITH CORONARY ANGIOGRAM;  Surgeon: Clent Demark, MD;  Location: [facility] CATH LAB;  Service: Cardiovascular;  Laterality: N/A;   LUMBAR LAMINECTOMY/DECOMPRESSION MICRODISCECTOMY N/A 05/17/2019   Procedure: Lumbar Four Lumbar Five Lumbar Laminectomy;  Surgeon: Jovita Gamma, MD;  Location: [facility];  Service: Neurosurgery;  Laterality: N/A;  Lumbar 4 Lumbar 5 Lumbar Laminectomy   TEE WITHOUT CARDIOVERSION N/A 12/18/2013   Procedure: TRANSESOPHAGEAL ECHOCARDIOGRAM (TEE);  Surgeon: Birdie Riddle, MD;  [facility] ENDOSCOPY;  Service: Cardiovascular;  Laterality: N/A;    Vitals:   04/29/21 1039  Pulse: (!) 50     Subjective Assessment - 04/29/21 1022     Subjective Pt states he weighed himself and found he had fluid.  He states the scales showed he had 3 lbs of fluid.  He takes his fluid pill as needed.  He states he took toresmide (fluid pill) last Friday and his BP is better.   "I'm getting stronger.  I can do things now that I could not do before". He is able to stand from his chair without using his hands which is the first time. He is also able to stand from his recliner without using his hands.  Pt denies any adverse effects after prior Rx and states he can tell he exercised. Pt has been riding the recumbent bike daily.  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    Pertinent History A-fib    Limitations Standing;Walking;House hold activities    How long can you walk comfortably? limited walking distance. Was walking a mile    Patient Stated Goals to be able to walk more.  to improve balance    Currently in Pain? No/denies  [facility] Adult PT Treatment/Exercise - 04/29/21 0001       Transfers   Comments Sit to stand:  2x10 reps without UEs on high/low table with table elevated.  pt required cuing for correct form.      Therapeutic Activites    Therapeutic Activities Other Therapeutic Activities   Performed sit to stand transfers as documented above.  Pt also performed step ups on a 6 inch step 2x10 bilat with bilat UEs on rail.  lateral step ups on a 4 inch step with bilat UEs on rail 2x10 reps.     Neuro Re-ed    Neuro Re-ed Details  Marching on Airex 2x10 reps without UE assistance on rails.  Standing on Airex with FA with SBA 2x30 sec and with FT 2x30 sec with SBA without UE assist.  Tandem stance 2x20 sec bilat with 1 UE assistance intermittently with min assist/CGA. Sidestepping in  bars with SBA inside parallel bars.      Exercises   Other Exercises  Reviewed response to prior Rx, pt presentation, pain level, current function, and HEP compliance. Ther ex performed to improve strength, function, and mobility.      Knee/Hip Exercises: Aerobic   Nustep L4 x 5 mins      Knee/Hip Exercises: Standing   Other Standing Knee Exercises standing hip abd 2x10 reps      Knee/Hip Exercises: Seated   Long Arc Quad 2 sets;10 reps    Long Arc Quad Limitations RTB      Knee/Hip Exercises: Supine   Straight Leg Raises 2 sets;10 reps    Straight Leg Raises Limitations bilat                    PT Education - 04/29/21 2046     Education Details Answered Pt's questions.  Instructed pt in correct form with sit/stands.  Reviewed HEP.    Person(s) Educated Patient    Methods Explanation;Demonstration    Comprehension Verbalized understanding;Returned demonstration              PT Short Term Goals - 04/16/21 1415       PT SHORT TERM GOAL #1   Title Patient will improve tandem stance balance to contact gaurd    Time 3    Period Weeks    Status New    Target Date 05/07/21      PT SHORT TERM GOAL #2   Title Patient will improve hip flexion strength to 5/5    Time 3    Period Weeks    Status New    Target Date 05/07/21      PT SHORT TERM GOAL #3   Title Patient will be indepdent with basic HEP for strength and balance    Time 3    Period Weeks    Status New    Target Date 05/07/21               PT Long Term Goals - 04/16/21 1418       PT LONG TERM GOAL #1   Title Patient will ambualte a mile without self reported fatigue or loss of balance    Time 6    Period Weeks    Status New    Target Date 05/28/21      PT LONG TERM GOAL #2   Title Patient will go up and down 4 steps without increased dyspnea    Time 6    Period Weeks    Status New  Target Date 05/28/21      PT LONG TERM GOAL #3   Title Patient will be indepdnent with full gym  program    Time 6    Period Weeks    Status New    Target Date 05/28/21                   Plan - 04/29/21 2054     Clinical Impression Statement Pt presents to Rx reporting improved performance of sit to stand transfers stating he is able to stand up from different chairs in his home without UE assistance. Pt has quad weakness as evidenced by performance of supine SLR having extensor lag with fatigue.  Pt required cuing to keep knee extended with supine SLR.  Pt did get fatigued with sit to stand transfers though is improving as evidenced by improved control and reduced speed with descent during sit to stands.  Pt requires intermittent cuing to stand up straigth t/o Rx. Pt does have balance deficits though is improving with performance of balance exercises requiring decreased UE assistnace.  He responded well to Rx having no c/o's after Rx. Pt should benefit from cont skilled PT services to address goals, improve mobility, and to restore PLOF.    Personal Factors and Comorbidities Comorbidity 1;Comorbidity 2;Comorbidity 3+    Comorbidities a-fib; low back surgery; Leukemia    Examination-Activity Limitations Locomotion Level;Stairs;Lift;Bend    PT Treatment/Interventions ADLs/Self Care Home Management;Aquatic Therapy;Gait training;Stair training;Functional mobility training;Therapeutic activities;Therapeutic exercise;Balance training;Neuromuscular re-education;Patient/family education;Manual techniques    PT Next Visit Plan Cont with progressing LE strength and balance training.  Continue to monitor 6 MWT.  Assess 5 times sit to stand test next visit.    PT Home Exercise Plan Access Code: [email or web address]    Consulted and Agree with Plan of Care Patient             Patient will benefit from skilled therapeutic intervention in order to improve the following deficits and impairments:  Difficulty walking, Decreased endurance, Cardiopulmonary status limiting activity, Decreased activity  tolerance, Decreased mobility, Decreased strength  Visit Diagnosis: Muscle weakness (generalized)  Unsteadiness on feet  Other abnormalities of gait and mobility     Problem List Patient Active Problem List   Diagnosis Date Noted   Persistent atrial fibrillation ([place]) 04/09/2021   Secondary hypercoagulable state ([place]) 04/09/2021   Weakness of both lower extremities 04/01/2021   Constipation 07/20/2019   Normocytic anemia 06/29/2019   Heart failure with reduced ejection fraction ([place]) 06/29/2019   Goals of care, counseling/discussion 06/09/2019   Lumbar stenosis with neurogenic claudication 05/17/2019   CML (chronic myelocytic leukemia) ([place]) 05/08/2019   HTN (hypertension) 12/16/2013   Atrial fibrillation with RVR ([place]) 12/16/2013    Selinda Michaels III PT, DPT 04/29/21 9:03 PM   [facility] [address] [place], [place], [address] Phone: [phone]   Fax:  [phone]  Name: EDIEL UNANGST MRN: [id number] Date of Birth: May 07, 1941

## 2021-04-30 ENCOUNTER — Other Ambulatory Visit (HOSPITAL_COMMUNITY): Payer: Self-pay

## 2021-04-30 MED FILL — Dasatinib Tab 50 MG: ORAL | 30 days supply | Qty: 30 | Fill #3 | Status: AC

## 2021-05-01 ENCOUNTER — Ambulatory Visit: Payer: Medicare HMO | Admitting: Sports Medicine

## 2021-05-01 ENCOUNTER — Ambulatory Visit (HOSPITAL_BASED_OUTPATIENT_CLINIC_OR_DEPARTMENT_OTHER): Payer: Medicare HMO | Admitting: Physical Therapy

## 2021-05-05 ENCOUNTER — Encounter (HOSPITAL_BASED_OUTPATIENT_CLINIC_OR_DEPARTMENT_OTHER): Payer: Self-pay | Admitting: Physical Therapy

## 2021-05-05 ENCOUNTER — Other Ambulatory Visit: Payer: Self-pay

## 2021-05-05 ENCOUNTER — Ambulatory Visit (HOSPITAL_BASED_OUTPATIENT_CLINIC_OR_DEPARTMENT_OTHER): Payer: Medicare HMO | Admitting: Physical Therapy

## 2021-05-05 ENCOUNTER — Other Ambulatory Visit (HOSPITAL_COMMUNITY): Payer: Self-pay

## 2021-05-05 DIAGNOSIS — R2681 Unsteadiness on feet: Secondary | ICD-10-CM

## 2021-05-05 DIAGNOSIS — R2689 Other abnormalities of gait and mobility: Secondary | ICD-10-CM

## 2021-05-05 DIAGNOSIS — M6281 Muscle weakness (generalized): Secondary | ICD-10-CM

## 2021-05-05 NOTE — Therapy (Signed)
[facility] [address] [place], [place], [address] Phone: [phone]   Fax:  [phone]  Physical Therapy Treatment  Patient Details  Name: Lance Hubbard MRN: [id number] Date of Birth: Oct 09, 1941 Referring Provider (PT): Dr Elie Confer   Encounter Date: 05/05/2021   PT End of Session - 05/05/21 1504     Visit Number 5    Number of Visits 12    Date for PT Re-Evaluation 05/28/21    Authorization Type Medicare Progress note at 10 Kx at 39    PT Start Time 1019    PT Stop Time 1104    PT Time Calculation (min) 45 min    Activity Tolerance Patient tolerated treatment well    Behavior During Therapy [facility] for tasks assessed/performed             Past Medical History:  Diagnosis Date   Arthritis    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   History of kidney stones    Hypertension    Persistent atrial fibrillation ([place])    Atrial Fibrillation    Past Surgical History:  Procedure Laterality Date   CARDIOVERSION N/A 12/18/2013   Procedure: CARDIOVERSION;  Surgeon: Birdie Riddle, MD;  Location: [facility];  Service: Cardiovascular;  Laterality: N/A;  9:58 Lido 40 mg, IV Propofol 40 mg, IV for anesthesia,  synched cardioversio @ 120 joules with successful NSR    COLONOSCOPY     KNEE ARTHROSCOPY     knee dsurgery     LEFT HEART CATHETERIZATION WITH CORONARY ANGIOGRAM N/A 12/19/2013   Procedure: LEFT HEART CATHETERIZATION WITH CORONARY ANGIOGRAM;  Surgeon: Clent Demark, MD;  Location: [facility] CATH [facility];  Service: Cardiovascular;  Laterality: N/A;   LUMBAR LAMINECTOMY/DECOMPRESSION MICRODISCECTOMY N/A 05/17/2019   Procedure: Lumbar Four Lumbar Five Lumbar Laminectomy;  Surgeon: Jovita Gamma, MD;  Location: [facility];  Service: Neurosurgery;  Laterality: N/A;  Lumbar 4 Lumbar 5 Lumbar Laminectomy   TEE WITHOUT CARDIOVERSION N/A 12/18/2013   Procedure: TRANSESOPHAGEAL ECHOCARDIOGRAM (TEE);  Surgeon: Birdie Riddle, MD;  Location: [facility];  Service: Cardiovascular;  Laterality: N/A;    There were no vitals filed for this visit.   Subjective Assessment - 05/05/21 1025     Subjective Pt states he went home after prior Rx and performed his exercises and rode his stationary bike.  He felt overworked the following day and took him 2 days to recover.  Pt states he went [place] the casino and was able to stand up from the casino chairs without using his hands.  He has been unable to do that in the past.  He did it approx 50 times and was very fatigued the following day.  He walked close to his wife the following day to ensure he had support.  It took 2 days to get over the fatigue.   Pt states he had back pain over the weekend when reaching to his right.  He took Ibuprofen this AM and is feeling better.  Pt reports 2-3/10 lumbar pain.  Pt states he is still stumbling.  Pt reports his balance is a littel better.                               [facility] Adult PT Treatment/Exercise - 05/05/21 0001       Transfers   Comments Sit to stand:  2x10 reps without UEs on high/low  table with table elevated.  pt required cuing for correct form.      Therapeutic Activites    Therapeutic Activities Other Therapeutic Activities   Performed sit to stand transfers as documented above.  Pt also performed step ups on a 6 inch step 2x10 bilat with 1 UE on rail.  lateral step ups on a 6 inch step with bilat UEs on rail 2x10 reps.     Neuro Re-ed    Neuro Re-ed Details  Standing with FT independently x30 sec.  Tandem stance 2x20 sec bilat with 1 UE assistance intermittently.  Tandem gait with bilat UE assist. Sidestepping in bars with SBA inside parallel bars.      Exercises   Other Exercises  Reviewed response to prior Rx, pt presentation, pain level, current function, and HEP compliance. Ther ex performed to improve strength, function, and mobility.  Updated HEP and gave pt a HEP handout.  Educated pt  concerning his response to Rx, appropriate activity, and fatigue level.      Knee/Hip Exercises: Aerobic   Nustep L4 x 5 mins      Knee/Hip Exercises: Standing   Other Standing Knee Exercises standing hip abd 2x10 reps                    PT Education - 05/05/21 1103     Education Details Answered Pt's questions.  Educated pt concerning appropriate response to activity and exercises and to not overdo with either.  Educated pt concerning apprporiate fatigue.  Instructed pt to not ride his stationary bike today after having a PT session.  Updated HEP and gave pt a HEP handout.  Educated pt in correct form and appropriate frequency.  instructed pt in having a sturdy external support to hold on to as needed with exercises.  Access Code: [email or web address]  URL: [email or web address]  Date: 05/05/2021  Prepared by: Ronny Flurry    Exercises  Romberg Stance - 1 x daily - 5-6 x weekly - 1 sets - 3 reps - 30 seconds hold  Sidestepping - 1 x daily - 4-5 x weekly - 2 sets - 10 reps.    Person(s) Educated Patient    Methods Explanation;Demonstration;Handout;Verbal cues    Comprehension Verbalized understanding;Returned demonstration              PT Short Term Goals - 04/16/21 1415       PT SHORT TERM GOAL #1   Title Patient will improve tandem stance balance to contact gaurd    Time 3    Period Weeks    Status New    Target Date 05/07/21      PT SHORT TERM GOAL #2   Title Patient will improve hip flexion strength to 5/5    Time 3    Period Weeks    Status New    Target Date 05/07/21      PT SHORT TERM GOAL #3   Title Patient will be indepdent with basic HEP for strength and balance    Time 3    Period Weeks    Status New    Target Date 05/07/21               PT Long Term Goals - 04/16/21 1418       PT LONG TERM GOAL #1   Title Patient will ambualte a mile without self reported fatigue or loss of balance    Time 6    Period Weeks    Status New  Target  Date 05/28/21      PT LONG TERM GOAL #2   Title Patient will go up and down 4 steps without increased dyspnea    Time 6    Period Weeks    Status New    Target Date 05/28/21      PT LONG TERM GOAL #3   Title Patient will be indepdnent with full gym program    Time 6    Period Weeks    Status New    Target Date 05/28/21                   Plan - 05/05/21 1103     Clinical Impression Statement Pt presents to Rx reporting having significant fatigue after prior Rx though did his exercises at home and rode his stationary bike.  PT answered Pt's questions satisfactorily and he demonstrated good understanding.  Pt is improving in balance as evidenced by his improvements of performance with balance exercises and subjective reports.  Pt continues to improve with functional LE strength as evidenced by performance of sit/stands in the clinic, at home, and in the community.  Pt has improved with eccentric control with lowering himself with stand to sit.  Increased step height with lateral step ups and dereased UE assist to 1 with fwd step ups today.  Pt performed exercises well and responded well to Rx having appropriate fatigue after Rx.  Pt should benefit from cont skilled PT services to address goals, improve mobility, and to restore PLOF.    Personal Factors and Comorbidities Comorbidity 1;Comorbidity 2;Comorbidity 3+    Comorbidities a-fib; low back surgery; Leukemia    PT Treatment/Interventions ADLs/Self Care Home Management;Aquatic Therapy;Gait training;Stair training;Functional mobility training;Therapeutic activities;Therapeutic exercise;Balance training;Neuromuscular re-education;Patient/family education;Manual techniques    PT Next Visit Plan Cont with progressing LE strength and balance training.  Assess 5 times sit to stand test next visit.  Assess 6 MWT if tolerable next visit.    PT Home Exercise Plan Access Code: [email or web address]  URL: [email or web address]  Date:  05/05/2021  Prepared by: Ronny Flurry    Exercises  Romberg Stance - 1 x daily - 5-6 x weekly - 1 sets - 3 reps - 30 seconds hold  Sidestepping - 1 x daily - 4-5 x weekly - 2 sets - 10 reps    Consulted and Agree with Plan of Care Patient             Patient will benefit from skilled therapeutic intervention in order to improve the following deficits and impairments:  Difficulty walking, Decreased endurance, Cardiopulmonary status limiting activity, Decreased activity tolerance, Decreased mobility, Decreased strength  Visit Diagnosis: Muscle weakness (generalized)  Unsteadiness on feet  Other abnormalities of gait and mobility     Problem List Patient Active Problem List   Diagnosis Date Noted   Persistent atrial fibrillation ([place]) 04/09/2021   Secondary hypercoagulable state ([place]) 04/09/2021   Weakness of both lower extremities 04/01/2021   Constipation 07/20/2019   Normocytic anemia 06/29/2019   Heart failure with reduced ejection fraction ([place]) 06/29/2019   Goals of care, counseling/discussion 06/09/2019   Lumbar stenosis with neurogenic claudication 05/17/2019   CML (chronic myelocytic leukemia) ([place]) 05/08/2019   HTN (hypertension) 12/16/2013   Atrial fibrillation with RVR ([place]) 12/16/2013    Selinda Michaels III PT, DPT 05/05/21 3:05 PM  [facility] [address] [place], [place], [address] Phone: [phone]   Fax:  [phone]  Name: Lance Hubbard MRN:  [id number] Date of Birth: 1940/11/01

## 2021-05-06 ENCOUNTER — Other Ambulatory Visit: Payer: Medicare HMO | Admitting: *Deleted

## 2021-05-06 DIAGNOSIS — I4819 Other persistent atrial fibrillation: Secondary | ICD-10-CM

## 2021-05-06 DIAGNOSIS — D6869 Other thrombophilia: Secondary | ICD-10-CM

## 2021-05-06 LAB — CBC WITH DIFFERENTIAL/PLATELET
Basophils Absolute: 0 10*3/uL (ref 0.0–0.2)
Basos: 0 %
EOS (ABSOLUTE): 0.1 10*3/uL (ref 0.0–0.4)
Eos: 2 %
Hematocrit: 37.8 % (ref 37.5–51.0)
Hemoglobin: 12.8 g/dL — ABNORMAL LOW (ref 13.0–17.7)
Immature Grans (Abs): 0 10*3/uL (ref 0.0–0.1)
Immature Granulocytes: 0 %
Lymphocytes Absolute: 2.6 10*3/uL (ref 0.7–3.1)
Lymphs: 32 %
MCH: 29.5 pg (ref 26.6–33.0)
MCHC: 33.9 g/dL (ref 31.5–35.7)
MCV: 87 fL (ref 79–97)
Monocytes Absolute: 0.7 10*3/uL (ref 0.1–0.9)
Monocytes: 8 %
Neutrophils Absolute: 4.6 10*3/uL (ref 1.4–7.0)
Neutrophils: 58 %
Platelets: 181 10*3/uL (ref 150–450)
RBC: 4.34 x10E6/uL (ref 4.14–5.80)
RDW: 14.3 % (ref 11.6–15.4)
WBC: 8 10*3/uL (ref 3.4–10.8)

## 2021-05-06 LAB — BASIC METABOLIC PANEL
BUN/Creatinine Ratio: 16 (ref 10–24)
BUN: 16 mg/dL (ref 8–27)
CO2: 23 mmol/L (ref 20–29)
Calcium: 9.2 mg/dL (ref 8.6–10.2)
Chloride: 101 mmol/L (ref 96–106)
Creatinine, Ser: 1 mg/dL (ref 0.76–1.27)
Glucose: 97 mg/dL (ref 65–99)
Potassium: 4.3 mmol/L (ref 3.5–5.2)
Sodium: 137 mmol/L (ref 134–144)
eGFR: 77 mL/min/{1.73_m2} (ref >59)

## 2021-05-08 ENCOUNTER — Ambulatory Visit (HOSPITAL_BASED_OUTPATIENT_CLINIC_OR_DEPARTMENT_OTHER): Payer: Medicare HMO | Admitting: Physical Therapy

## 2021-05-08 ENCOUNTER — Other Ambulatory Visit: Payer: Self-pay

## 2021-05-08 ENCOUNTER — Encounter (HOSPITAL_BASED_OUTPATIENT_CLINIC_OR_DEPARTMENT_OTHER): Payer: Self-pay | Admitting: Physical Therapy

## 2021-05-08 DIAGNOSIS — R2689 Other abnormalities of gait and mobility: Secondary | ICD-10-CM | POA: Diagnosis not present

## 2021-05-08 DIAGNOSIS — R2681 Unsteadiness on feet: Secondary | ICD-10-CM

## 2021-05-08 DIAGNOSIS — M6281 Muscle weakness (generalized): Secondary | ICD-10-CM

## 2021-05-08 NOTE — Therapy (Signed)
[facility] [address] [place], [place], [address] Phone: [phone]   Fax:  [phone]  Physical Therapy Treatment  Patient Details  Name: Lance Hubbard MRN: [id number] Date of Birth: Feb 23, 1941 Referring Provider (PT): Dr Elie Confer   Encounter Date: 05/08/2021   PT End of Session - 05/08/21 2332     Visit Number 6    Number of Visits 12    Date for PT Re-Evaluation 05/28/21    Authorization Type Medicare Progress note at 10 Kx at 34    PT Start Time 1026    PT Stop Time 1105    PT Time Calculation (min) 39 min    Activity Tolerance Patient tolerated treatment well    Behavior During Therapy [facility] for tasks assessed/performed             Past Medical History:  Diagnosis Date   Arthritis    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   History of kidney stones    Hypertension    Persistent atrial fibrillation ([place])    Atrial Fibrillation    Past Surgical History:  Procedure Laterality Date   CARDIOVERSION N/A 12/18/2013   Procedure: CARDIOVERSION;  Surgeon: Birdie Riddle, MD;  Location: [facility];  Service: Cardiovascular;  Laterality: N/A;  9:58 Lido 40 mg, IV Propofol 40 mg, IV for anesthesia,  synched cardioversio @ 120 joules with successful NSR    COLONOSCOPY     KNEE ARTHROSCOPY     knee dsurgery     LEFT HEART CATHETERIZATION WITH CORONARY ANGIOGRAM N/A 12/19/2013   Procedure: LEFT HEART CATHETERIZATION WITH CORONARY ANGIOGRAM;  Surgeon: Clent Demark, MD;  Location: [facility] CATH [facility];  Service: Cardiovascular;  Laterality: N/A;   LUMBAR LAMINECTOMY/DECOMPRESSION MICRODISCECTOMY N/A 05/17/2019   Procedure: Lumbar Four Lumbar Five Lumbar Laminectomy;  Surgeon: Jovita Gamma, MD;  Location: [facility];  Service: Neurosurgery;  Laterality: N/A;  Lumbar 4 Lumbar 5 Lumbar Laminectomy   TEE WITHOUT CARDIOVERSION N/A 12/18/2013   Procedure: TRANSESOPHAGEAL ECHOCARDIOGRAM (TEE);  Surgeon: Birdie Riddle, MD;  Location: [facility];  Service: Cardiovascular;  Laterality: N/A;    There were no vitals filed for this visit.   Subjective Assessment - 05/08/21 1027     Subjective Pt reports his back has almost stopped hurting.  Pt states he felt fine after prior Rx and wasn't overly tired.  Pt reports improved strength in R LE and is able to pick up R LE better with ambulation.  He is improving with sit/stand transfers.  Pt repors compliance with HEP. O2: 98%, HR:  50 at beginning of Rx.    Pertinent History A-fib    Limitations Standing;Walking;House hold activities    Patient Stated Goals to be able to walk more.  to improve balance                [facility] PT Assessment - 05/08/21 0001       Transfers   Five time sit to stand comments  16 seconds without using UEs.  Pt did require cuing to stand up all the way during testing.      6 minute walk test results    Aerobic Endurance Distance Walked --   1,156 ft   Endurance additional comments Pt ambulated entire 6 minutes.  O2:  99%, HR:  66 after 6 MWT.  [facility] Adult PT Treatment/Exercise - 05/08/21 0001       Therapeutic Activites    Therapeutic Activities --   Reviewed response to prior Rx, pt presentation, pain level, current function, and HEP compliance.Pt performed 5 times sit to stand and 6 MWT.  Pt also performed step ups and lateral step ups on a 6 inch step with bilat UEs on rail 2x10 reps bilat.     Neuro Re-ed    Neuro Re-ed Details  Tandem stance 2x20 sec bilat with 1 UE assistance intermittently.  Tandem gait with bilat UE assist with SBA and one occasion of CGA.  Sidestepping in bars independently without UE assist inside parallel bars.                    PT Education - 05/08/21 2330     Education Details Answered pt's questions.  Educated pt concerning POC.  Educated pt in objective findings including functional testing and in comparison to his prior testing.     Person(s) Educated Patient    Methods Explanation    Comprehension Verbalized understanding              PT Short Term Goals - 05/08/21 1054       PT SHORT TERM GOAL #1   Status Not Met   Pt unable to attain tandem stance without UE assist.  Pt requires intermittent UE assist on rail.     PT SHORT TERM GOAL #2   Status Achieved      PT SHORT TERM GOAL #3   Status Achieved               PT Long Term Goals - 04/16/21 1418       PT LONG TERM GOAL #1   Title Patient will ambualte a mile without self reported fatigue or loss of balance    Time 6    Period Weeks    Status New    Target Date 05/28/21      PT LONG TERM GOAL #2   Title Patient will go up and down 4 steps without increased dyspnea    Time 6    Period Weeks    Status New    Target Date 05/28/21      PT LONG TERM GOAL #3   Title Patient will be indepdnent with full gym program    Time 6    Period Weeks    Status New    Target Date 05/28/21                   Plan - 05/08/21 1058     Clinical Impression Statement Pt has met STG's #2,3 but not #1.  He continues to have difficulty with tandem stance though is improving.  Pt demonstrates hip flexion strength 5/5 MMT bilat.  He demonstrates improved 5 times sit to stand by completing 5 reps in 16 seconds which is 3 seconds less than prior and also did not use hands during the test.  Pt increased his 6 MWT ambulation distance from 1,120 ft prior to 1,156 ft today.  Pt is improving with functional mobility and strength as evidenced by subjective reports and objective findings.  Pt responded well to Rx having no c/o's after Rx.  He should continue to benefit from cont skilled PT services to address ongoing goals and to assist in restoring desired level of function.    Comorbidities a-fib; low back surgery; Leukemia    PT Treatment/Interventions ADLs/Self  Care Home Management;Aquatic Therapy;Gait training;Stair training;Functional mobility  training;Therapeutic activities;Therapeutic exercise;Balance training;Neuromuscular re-education;Patient/family education;Manual techniques    PT Next Visit Plan Cont with progressing LE strength and balance training.    PT Home Exercise Plan Access Code: [email or web address]    Consulted and Agree with Plan of Care Patient             Patient will benefit from skilled therapeutic intervention in order to improve the following deficits and impairments:  Difficulty walking, Decreased endurance, Cardiopulmonary status limiting activity, Decreased activity tolerance, Decreased mobility, Decreased strength  Visit Diagnosis: Muscle weakness (generalized)  Unsteadiness on feet  Other abnormalities of gait and mobility     Problem List Patient Active Problem List   Diagnosis Date Noted   Persistent atrial fibrillation ([place]) 04/09/2021   Secondary hypercoagulable state ([place]) 04/09/2021   Weakness of both lower extremities 04/01/2021   Constipation 07/20/2019   Normocytic anemia 06/29/2019   Heart failure with reduced ejection fraction ([place]) 06/29/2019   Goals of care, counseling/discussion 06/09/2019   Lumbar stenosis with neurogenic claudication 05/17/2019   CML (chronic myelocytic leukemia) ([place]) 05/08/2019   HTN (hypertension) 12/16/2013   Atrial fibrillation with RVR ([place]) 12/16/2013    Selinda Michaels III PT, DPT 05/08/21 11:50 PM   [facility] [address] [place], [place], [address] Phone: [phone]   Fax:  [phone]  Name: Lance Hubbard MRN: [id number] Date of Birth: 07-21-1941

## 2021-05-12 ENCOUNTER — Other Ambulatory Visit: Payer: Self-pay

## 2021-05-12 ENCOUNTER — Encounter (HOSPITAL_BASED_OUTPATIENT_CLINIC_OR_DEPARTMENT_OTHER): Payer: Self-pay | Admitting: Physical Therapy

## 2021-05-12 ENCOUNTER — Ambulatory Visit (HOSPITAL_BASED_OUTPATIENT_CLINIC_OR_DEPARTMENT_OTHER): Payer: Medicare HMO | Admitting: Physical Therapy

## 2021-05-12 DIAGNOSIS — R2681 Unsteadiness on feet: Secondary | ICD-10-CM | POA: Diagnosis not present

## 2021-05-12 DIAGNOSIS — M6281 Muscle weakness (generalized): Secondary | ICD-10-CM | POA: Diagnosis not present

## 2021-05-12 DIAGNOSIS — R2689 Other abnormalities of gait and mobility: Secondary | ICD-10-CM

## 2021-05-12 NOTE — Therapy (Addendum)
[facility] [address] [place], [place], [address] Phone: [phone]   Fax:  [phone]  Physical Therapy Treatment  Patient Details  Name: Lance Hubbard MRN: [id number] Date of Birth: May 31, 1941 Referring Provider (PT): Dr Elie Confer   Encounter Date: 05/12/2021   PT End of Session - 05/12/21 1448     Visit Number 7    Number of Visits 12    Date for PT Re-Evaluation 05/28/21    Authorization Type Medicare Progress note at 10 Kx at 13    PT Start Time 7371    PT Stop Time 1102    PT Time Calculation (min) 47 min    Activity Tolerance Patient tolerated treatment well    Behavior During Therapy [facility] for tasks assessed/performed             Past Medical History:  Diagnosis Date   Arthritis    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   History of kidney stones    Hypertension    Persistent atrial fibrillation (H[facility])    Atrial Fibrillation    Past Surgical History:  Procedure Laterality Date   CARDIOVERSION N/A 12/18/2013   Procedure: CARDIOVERSION;  Surgeon: Birdie Riddle, MD;  Location: [facility];  Service: Cardiovascular;  Laterality: N/A;  9:58 Lido 40 mg, IV Propofol 40 mg, IV for anesthesia,  synched cardioversio @ 120 joules with successful NSR    COLONOSCOPY     KNEE ARTHROSCOPY     knee dsurgery     LEFT HEART CATHETERIZATION WITH CORONARY ANGIOGRAM N/A 12/19/2013   Procedure: LEFT HEART CATHETERIZATION WITH CORONARY ANGIOGRAM;  Surgeon: Clent Demark, MD;  Location: [facility] CATH LAB;  Service: Cardiovascular;  Laterality: N/A;   LUMBAR LAMINECTOMY/DECOMPRESSION MICRODISCECTOMY N/A 05/17/2019   Procedure: Lumbar Four Lumbar Five Lumbar Laminectomy;  Surgeon: Jovita Gamma, MD;  Location: [facility];  Service: Neurosurgery;  Laterality: N/A;  Lumbar 4 Lumbar 5 Lumbar Laminectomy   TEE WITHOUT CARDIOVERSION N/A 12/18/2013   Procedure: TRANSESOPHAGEAL ECHOCARDIOGRAM (TEE);  Surgeon: Birdie Riddle, MD;  Location: [facility];  Service: Cardiovascular;  Laterality: N/A;    There were no vitals filed for this visit.   Subjective Assessment - 05/12/21 1021     Subjective Pt states he's still "stumbling" though no worse than prior.  Pt states he's been stumbling for over a year.  Pt states his glutes becomes weak with activity.  Pt denies pain currently.  Pt states he felt good after prior Rx, just a little tired.  He did a lot of exercises yesterday and feels fine this AM which is an improvement.  He normally would be exhausted the next day.  Pt reports he is able to stand up from his chair at home easier.  Pt reports compliance with HEP.    Pertinent History A-fib    How long can you walk comfortably? limited walking distance. Was walking a mile    Patient Stated Goals to be able to walk more.  to improve balance                               [facility] Adult PT Treatment/Exercise - 05/12/21 0001       Therapeutic Activites    Therapeutic Activities --   Pt performed:  step ups on a 6 inch step with 1 rail 2x10, lateral step ups on 6 inch step with bilat  UE assist, and mini squats x 2x10 reps     Neuro Re-ed    Neuro Re-ed Details  Tandem stance 2x20 sec bilat with 1 UE assistance intermittently.  Tandem gait with bilat UE assist in // bars.  Standing on airex pad with FA EC 2x30 seconds and on the floor x 30 sec with CGA and min assist and occasional usage of rail.  Standing on airex with FT 2x30 sec.  marching on airex pad with occasional UE assist on // bars.      Exercises   Other Exercises  Reviewed response to prior Rx, pt presentation, pain level, current function, and HEP compliance. Ther ex performed to improve strength, function, and mobility.      Knee/Hip Exercises: Aerobic   Nustep L4x6 mins      Knee/Hip Exercises: Machines for Strengthening   Cybex Leg Press [employer] Leg press 40# 2x10 reps      Knee/Hip Exercises: Standing   Other  Standing Knee Exercises standing hip abd with RTB 2x10 and lateral band walks with RTB with bilat UE assist on rail                    PT Education - 05/12/21 1447     Education Details Answered pt's questions.  Educated pt in correct form and positioning of exercises.    Person(s) Educated Patient    Methods Explanation;Demonstration    Comprehension Verbalized understanding;Returned demonstration              PT Short Term Goals - 05/08/21 1054       PT SHORT TERM GOAL #1   Status Not Met   Pt unable to attain tandem stance without UE assist.  Pt requires intermittent UE assist on rail.     PT SHORT TERM GOAL #2   Status Achieved      PT SHORT TERM GOAL #3   Status Achieved               PT Long Term Goals - 04/16/21 1418       PT LONG TERM GOAL #1   Title Patient will ambualte a mile without self reported fatigue or loss of balance    Time 6    Period Weeks    Status New    Target Date 05/28/21      PT LONG TERM GOAL #2   Title Patient will go up and down 4 steps without increased dyspnea    Time 6    Period Weeks    Status New    Target Date 05/28/21      PT LONG TERM GOAL #3   Title Patient will be indepdnent with full gym program    Time 6    Period Weeks    Status New    Target Date 05/28/21                   Plan - 05/12/21 1054     Clinical Impression Statement Pt continues to report that he "stumbles" though states he has been stumbling for a year.  Pt is improving with function including fxnl LE strength as evidenced by performing sit to stand tranfsers easier at home without UE assistance.  Progressed exercises with adding [employer] leg press, RTB to hip abd, and lateral band walks to improve LE strength and functional mobility.  Pt performed new and progressed exercises well without c/o's.  He requires cuing and instruction for correct  form with mini squats.  He requires UE assistance on rail with step up activities.   He also requires occasional UE support with tandem stance though is improving.  He responded well to Rx having no pain or c/o's after Rx.  He should continue to benefit from cont skilled PT services to improve balance, strength, functional mobility and to address ongoing goals.    Personal Factors and Comorbidities Comorbidity 1;Comorbidity 2;Comorbidity 3+    Comorbidities a-fib; low back surgery; Leukemia    PT Treatment/Interventions ADLs/Self Care Home Management;Aquatic Therapy;Gait training;Stair training;Functional mobility training;Therapeutic activities;Therapeutic exercise;Balance training;Neuromuscular re-education;Patient/family education;Manual techniques    PT Next Visit Plan Cont with progressing LE strength and balance training.    PT Home Exercise Plan Access Code: [email or web address]    Consulted and Agree with Plan of Care Patient             Patient will benefit from skilled therapeutic intervention in order to improve the following deficits and impairments:  Difficulty walking, Decreased endurance, Cardiopulmonary status limiting activity, Decreased activity tolerance, Decreased mobility, Decreased strength  Visit Diagnosis: Muscle weakness (generalized)  Unsteadiness on feet  Other abnormalities of gait and mobility     Problem List Patient Active Problem List   Diagnosis Date Noted   Persistent atrial fibrillation ([place]) 04/09/2021   Secondary hypercoagulable state ([place]) 04/09/2021   Weakness of both lower extremities 04/01/2021   Constipation 07/20/2019   Normocytic anemia 06/29/2019   Heart failure with reduced ejection fraction ([place]) 06/29/2019   Goals of care, counseling/discussion 06/09/2019   Lumbar stenosis with neurogenic claudication 05/17/2019   CML (chronic myelocytic leukemia) ([place]) 05/08/2019   HTN (hypertension) 12/16/2013   Atrial fibrillation with RVR ([place]) 12/16/2013    Selinda Michaels III PT, DPT 05/12/21 3:39 PM   [facility] [address] [place], [place], [address] Phone: [phone]   Fax:  [phone]  Name: Lance Hubbard MRN: [id number] Date of Birth: [birth year]

## 2021-05-15 ENCOUNTER — Ambulatory Visit (HOSPITAL_BASED_OUTPATIENT_CLINIC_OR_DEPARTMENT_OTHER): Payer: Medicare HMO | Admitting: Physical Therapy

## 2021-05-15 ENCOUNTER — Encounter (HOSPITAL_BASED_OUTPATIENT_CLINIC_OR_DEPARTMENT_OTHER): Payer: Self-pay | Admitting: Physical Therapy

## 2021-05-15 ENCOUNTER — Other Ambulatory Visit: Payer: Self-pay

## 2021-05-15 DIAGNOSIS — R2689 Other abnormalities of gait and mobility: Secondary | ICD-10-CM | POA: Diagnosis not present

## 2021-05-15 DIAGNOSIS — R2681 Unsteadiness on feet: Secondary | ICD-10-CM | POA: Diagnosis not present

## 2021-05-15 DIAGNOSIS — M6281 Muscle weakness (generalized): Secondary | ICD-10-CM | POA: Diagnosis not present

## 2021-05-15 NOTE — Therapy (Signed)
[facility] [address] [place], [place], [address] Phone: [phone]   Fax:  [phone]  Physical Therapy Treatment  Patient Details  Name: Lance Hubbard MRN: [id number] Date of Birth: July 25, 1941 Referring Provider (PT): Dr Elie Confer   Encounter Date: 05/15/2021   PT End of Session - 05/15/21 1133     Visit Number 8    Number of Visits 12    Date for PT Re-Evaluation 05/28/21    Authorization Type Medicare Progress note at 10 Kx at 15    Progress Note Due on Visit --   Next visit   PT Start Time 1106    PT Stop Time 1144    PT Time Calculation (min) 38 min    Activity Tolerance Patient tolerated treatment well    Behavior During Therapy [facility] for tasks assessed/performed             Past Medical History:  Diagnosis Date   Arthritis    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   History of kidney stones    Hypertension    Persistent atrial fibrillation ([place])    Atrial Fibrillation    Past Surgical History:  Procedure Laterality Date   CARDIOVERSION N/A 12/18/2013   Procedure: CARDIOVERSION;  Surgeon: Birdie Riddle, MD;  Location: [facility];  Service: Cardiovascular;  Laterality: N/A;  9:58 Lido 40 mg, IV Propofol 40 mg, IV for anesthesia,  synched cardioversio @ 120 joules with successful NSR    COLONOSCOPY     KNEE ARTHROSCOPY     knee dsurgery     LEFT HEART CATHETERIZATION WITH CORONARY ANGIOGRAM N/A 12/19/2013   Procedure: LEFT HEART CATHETERIZATION WITH CORONARY ANGIOGRAM;  Surgeon: Clent Demark, MD;  Location: [facility] CATH [facility];  Service: Cardiovascular;  Laterality: N/A;   LUMBAR LAMINECTOMY/DECOMPRESSION MICRODISCECTOMY N/A 05/17/2019   Procedure: Lumbar Four Lumbar Five Lumbar Laminectomy;  Surgeon: Jovita Gamma, MD;  Location: [facility];  Service: Neurosurgery;  Laterality: N/A;  Lumbar 4 Lumbar 5 Lumbar Laminectomy   TEE WITHOUT CARDIOVERSION N/A 12/18/2013   Procedure:  TRANSESOPHAGEAL ECHOCARDIOGRAM (TEE);  Surgeon: Birdie Riddle, MD;  Location: [facility];  Service: Cardiovascular;  Laterality: N/A;    There were no vitals filed for this visit.   Subjective Assessment - 05/15/21 1109     Subjective Pt states that PT is helping.  Pt reports he was tired after prior Rx including the next AM.  Pt states he had some pain in bilat buttocks and soreness after prior Rx.  Pt states he feels fine today.  Pt states he gets tingling in his feet.  He reports he has some twitching in his R > L LE lying in bed at night.  Pt states he has been having this awhile and thinks it may be from his surgery 2 years ago.  Pt states he will only come once next week.    Pertinent History A-fib    Currently in Pain? No/denies                               [facility] Adult PT Treatment/Exercise - 05/15/21 0001       Therapeutic Activites    Therapeutic Activities --   Pt performed: step ups on a 6 inch step with 1 rail 1x10 and mini squats x 2x10 reps     Exercises   Other Exercises  Reviewed response to  prior Rx, pt presentation, pain level, current function, and HEP compliance. Ther ex performed to improve strength, function, and mobility.      Knee/Hip Exercises: Stretches   Other Knee/Hip Stretches L S/L'ing manual quad stretch 3x30 seconds      Knee/Hip Exercises: Aerobic   Nustep L5 x 6 mins      Knee/Hip Exercises: Machines for Strengthening   Cybex Leg Press shuttle leg press at 50 x 15 reps, 75 x 12 reps      Knee/Hip Exercises: Standing   Other Standing Knee Exercises standing hip abd with RTB 2x10 except 1 set of 5 reps on R. lateral band walks with RTB with bilat UE assist on rail x 3 laps.                    PT Education - 05/15/21 2108     Education Details Answered Pt's questions.  Educated pt concerning POC including that pt would be put on hold after ablation until MD clears pt to return.  Instructed pt in correct form with  exercises.    Person(s) Educated Patient    Methods Explanation;Demonstration;Verbal cues    Comprehension Verbalized understanding;Returned demonstration              PT Short Term Goals - 05/08/21 1054       PT SHORT TERM GOAL #1   Status Not Met   Pt unable to attain tandem stance without UE assist.  Pt requires intermittent UE assist on rail.     PT SHORT TERM GOAL #2   Status Achieved      PT SHORT TERM GOAL #3   Status Achieved               PT Long Term Goals - 04/16/21 1418       PT LONG TERM GOAL #1   Title Patient will ambualte a mile without self reported fatigue or loss of balance    Time 6    Period Weeks    Status New    Target Date 05/28/21      PT LONG TERM GOAL #2   Title Patient will go up and down 4 steps without increased dyspnea    Time 6    Period Weeks    Status New    Target Date 05/28/21      PT LONG TERM GOAL #3   Title Patient will be indepdnent with full gym program    Time 6    Period Weeks    Status New    Target Date 05/28/21                   Plan - 05/15/21 1135     Clinical Impression Statement Pt states he thinks PT is helping.  Pt is improving with strength including the ability to perform transfers with greater ease and less difficulty.  Pt does still hve limited tolerance ot activity and becomes fatigued with activity.  Rx and exercises were limited today due to pt c/o'ing of having some cramping in R glute and proximal quad with standing exercises.  He states he has had these cramps before and they typically go away.  The pain that lasted a little whlie was in his proximal lateral R quad.  Pt was tender to palpate at lateral proximal quad.  PT stopped exercises and had pt sit down and rest.  PT then performed a quad stretch in S/L'ing and pt reports improved sx's.  Pt reports having no quad cramping or pain after Rx. He also denies any glute pain just fatigue after Rx.    Comorbidities a-fib; low back surgery;  Leukemia    PT Treatment/Interventions ADLs/Self Care Home Management;Aquatic Therapy;Gait training;Stair training;Functional mobility training;Therapeutic activities;Therapeutic exercise;Balance training;Neuromuscular re-education;Patient/family education;Manual techniques    PT Next Visit Plan Pt is only scheduling one visit next week.  PN next visit.  Pt is having an ablation on 05/29/21.    PT Home Exercise Plan Access Code: [email or web address]    Consulted and Agree with Plan of Care Patient             Patient will benefit from skilled therapeutic intervention in order to improve the following deficits and impairments:  Difficulty walking, Decreased endurance, Cardiopulmonary status limiting activity, Decreased activity tolerance, Decreased mobility, Decreased strength  Visit Diagnosis: Muscle weakness (generalized)  Unsteadiness on feet  Other abnormalities of gait and mobility     Problem List Patient Active Problem List   Diagnosis Date Noted   Persistent atrial fibrillation ([place]) 04/09/2021   Secondary hypercoagulable state ([place]) 04/09/2021   Weakness of both lower extremities 04/01/2021   Constipation 07/20/2019   Normocytic anemia 06/29/2019   Heart failure with reduced ejection fraction ([place]) 06/29/2019   Goals of care, counseling/discussion 06/09/2019   Lumbar stenosis with neurogenic claudication 05/17/2019   CML (chronic myelocytic leukemia) ([place]) 05/08/2019   HTN (hypertension) 12/16/2013   Atrial fibrillation with RVR ([place]) 12/16/2013    Selinda Michaels III PT, DPT 05/15/21 9:24 PM   [facility] [address] [place], [place], [address] Phone: [phone]   Fax:  [phone]  Name: HOLSTON OYAMA MRN: [id number] Date of Birth: 1941-08-21

## 2021-05-20 ENCOUNTER — Other Ambulatory Visit: Payer: Self-pay

## 2021-05-20 ENCOUNTER — Ambulatory Visit (HOSPITAL_BASED_OUTPATIENT_CLINIC_OR_DEPARTMENT_OTHER): Payer: Medicare HMO | Admitting: Physical Therapy

## 2021-05-20 ENCOUNTER — Encounter (HOSPITAL_BASED_OUTPATIENT_CLINIC_OR_DEPARTMENT_OTHER): Payer: Self-pay | Admitting: Physical Therapy

## 2021-05-20 DIAGNOSIS — R2689 Other abnormalities of gait and mobility: Secondary | ICD-10-CM

## 2021-05-20 DIAGNOSIS — R2681 Unsteadiness on feet: Secondary | ICD-10-CM

## 2021-05-20 DIAGNOSIS — M6281 Muscle weakness (generalized): Secondary | ICD-10-CM

## 2021-05-20 NOTE — Therapy (Signed)
[facility] [address] [place], [place], [address] Phone: [phone]   Fax:  [phone]  Physical Therapy Treatment/Progress Note  Patient Details  Name: Lance Hubbard MRN: [id number] Date of Birth: Oct 05, 1941 Referring Provider (PT): Dr Elie Confer   Encounter Date: 05/20/2021   PT End of Session - 05/20/21 2139     Visit Number 9    Number of Visits 18    Date for PT Re-Evaluation 06/17/21    Authorization Type Medicare Progress note at 10 Kx at 15    Progress Note Due on Visit --   06/17/21   PT Start Time 1032    PT Stop Time 1110    PT Time Calculation (min) 38 min    Activity Tolerance Patient tolerated treatment well    Behavior During Therapy [facility] for tasks assessed/performed             Past Medical History:  Diagnosis Date   Arthritis    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   History of kidney stones    Hypertension    Persistent atrial fibrillation ([place])    Atrial Fibrillation    Past Surgical History:  Procedure Laterality Date   CARDIOVERSION N/A 12/18/2013   Procedure: CARDIOVERSION;  Surgeon: Birdie Riddle, MD;  Location: [place] [facility];  Service: Cardiovascular;  Laterality: N/A;  9:58 Lido 40 mg, IV Propofol 40 mg, IV for anesthesia,  synched cardioversio @ 120 joules with successful NSR    COLONOSCOPY     KNEE ARTHROSCOPY     knee dsurgery     LEFT HEART CATHETERIZATION WITH CORONARY ANGIOGRAM N/A 12/19/2013   Procedure: LEFT HEART CATHETERIZATION WITH CORONARY ANGIOGRAM;  Surgeon: Clent Demark, MD;  Location: [place] CATH LAB;  Service: Cardiovascular;  Laterality: N/A;   LUMBAR LAMINECTOMY/DECOMPRESSION MICRODISCECTOMY N/A 05/17/2019   Procedure: Lumbar Four Lumbar Five Lumbar Laminectomy;  Surgeon: Jovita Gamma, MD;  Location: [facility];  Service: Neurosurgery;  Laterality: N/A;  Lumbar 4 Lumbar 5 Lumbar Laminectomy   TEE WITHOUT CARDIOVERSION N/A 12/18/2013    Procedure: TRANSESOPHAGEAL ECHOCARDIOGRAM (TEE);  Surgeon: Birdie Riddle, MD;  Location: [facility];  Service: Cardiovascular;  Laterality: N/A;    There were no vitals filed for this visit.   Subjective Assessment - 05/20/21 1036     Subjective Pt states that PT is helping and he is making progress.  He reports improved performance of sit/stand transfers and increased ambulation distance.  Pt states his balance is a little better.  Pt reports no increased pain after prior Rx though his cramp lasted 4 days.  Pt states he is increasing the resistance on his stationary bike.  Pt has a cardiac ablation next week.  Pt saw cardiac MD yesterday and was told his BP was a little high probably due to worrying about his upcoming procedure.  Pt states MD gave him a medication to relax him but not sure the name of the medication.  BP:  152/83 and HR:  53.    Pertinent History A-fib    Currently in Pain? No/denies                [facility] PT Assessment - 05/20/21 0001       Observation/Other Assessments   Other Surveys  Lower Extremity Functional Scale    Lower Extremity Functional Scale  57      Transfers   Five time sit to stand comments  16 seconds without using  UEs excpet for 1 occasion.  Pt did require cuing to stand up all the way during testing.      Ambulation/Gait   Gait Comments Pt ascended/descended 4 steps without labored breathing or fatigue.  He did use the rail.                           [facility] Adult PT Treatment/Exercise - 05/20/21 0001       Neuro Re-ed    Neuro Re-ed Details  Tandem stance 3x20 sec bilat with 1 UE assistance intermittently.  Tandem gait with bilat UE assist in // bars.  marching on airex pad with occasional UE assist on // bars.  steps ups on foam pad x10 reps each leg.      Exercises   Other Exercises  Reviewed response to prior Rx, pt presentation, pain level, current function, and HEP compliance. Ther ex performed to improve strength,  function, and mobility. Pt completed LEFS.  Educated pt concerning POC      Knee/Hip Exercises: Machines for Strengthening   Cy[employer] Leg press 40# 3x10 reps                    PT Education - 05/20/21 2136     Education Details Educated pt concerning POC and answered Pt's questions satisfactorily. Educated pt he would be put on hold after cardiac ablation until MD clears pt to return.  Instructed pt he needs a written order from cardiac MD who is performing the procedure for clearance to return to PT.  Educated pt in objective findings compared to prior objective findings    Person(s) Educated Patient    Methods Explanation;Demonstration    Comprehension Verbalized understanding;Returned demonstration              PT Short Term Goals - 05/20/21 2147       PT SHORT TERM GOAL #1   Title Patient will improve tandem stance balance to contact gaurd    Time 3    Period Weeks    Status Not Met    Target Date 05/07/21      PT SHORT TERM GOAL #2   Title Patient will improve hip flexion strength to 5/5    Time 3    Period Weeks    Status Achieved    Target Date 05/07/21      PT SHORT TERM GOAL #3   Title Patient will be independent with basic HEP for strength and balance    Time 3    Period Weeks    Status Achieved    Target Date 05/07/21               PT Long Term Goals - 05/20/21 2148       PT LONG TERM GOAL #1   Title 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    Time 8    Period Weeks    Status Revised    Target Date 07/15/21      PT LONG TERM GOAL #2   Title Patient will go up and down 4 steps without increased dyspnea    Time 6    Period Weeks    Status Achieved    Target Date 05/28/21      PT LONG TERM GOAL #3   Title Patient will be independent with advanced HEP for improved strength, balance, tolerance to activity,  and functional mobility    Time 8     Period Weeks    Status Revised    Target Date 07/15/21      PT LONG TERM GOAL #4   Title Pt will report improved tolerance to daily activities and improved balance with daily mobility    Time 8    Period Weeks    Status New    Target Date 07/15/21                   Plan - 05/20/21 1046     Clinical Impression Statement Pt is making progress with LE strength, tolerance to activity, balance, and functional mobility.  He reports improved performance of sit/stand transfers and increased ambulation distance.  He is able to stand up from his chairs at home without using his UEs.  Pt demonstrates improved time overall with 5 times sit to stand test though no different from the prior test a couple of weeks ago.  Pt able to perform 4 steps well without increased dyspnea.  Pt is able to ambulate increased distance though has not been walking extended community distance recently.  He continues to report some balance issues feeling a little unsteady at times.  Pt continues to have difficulty with performing tandem stance.  He responds well to PT though has increased fatigue occasionally.  Pt has met STG's#2,3 and LTG#2.  See below for updated goals.  Pt would benefit from cont skilled PT services to address ongoing goals, restore PLOF, and improve tolerance to activity.  Pt is having a cardiac ablation next week and will bue put on hold after the ablation.    Personal Factors and Comorbidities Comorbidity 1;Comorbidity 2;Comorbidity 3+    Comorbidities a-fib; low back surgery; Leukemia    Rehab Potential Good    PT Frequency --   Pt may be seen next week though pt on hold after the ablation.  When pt has clearance from MD to return to PT, probably 2x/wk   PT Duration 8 weeks    PT Treatment/Interventions ADLs/Self Care Home Management;Aquatic Therapy;Gait training;Stair training;Functional mobility training;Therapeutic activities;Therapeutic exercise;Balance training;Neuromuscular  re-education;Patient/family education;Manual techniques    PT Next Visit Plan Pt wasn't scheduling next week thoug wife called back and scheduled.  PT will call pt.  Pt is having an ablation on 05/29/21 and will be put on hold until after pt gets clearance to return.    PT Home Exercise Plan Access Code: [email or web address]    Consulted and Agree with Plan of Care Patient             Patient will benefit from skilled therapeutic intervention in order to improve the following deficits and impairments:  Difficulty walking, Decreased endurance, Cardiopulmonary status limiting activity, Decreased activity tolerance, Decreased mobility, Decreased strength  Visit Diagnosis: Muscle weakness (generalized)  Unsteadiness on feet  Other abnormalities of gait and mobility     Problem List Patient Active Problem List   Diagnosis Date Noted   Persistent atrial fibrillation ([place]) 04/09/2021   Secondary hypercoagulable state ([place]) 04/09/2021   Weakness of both lower extremities 04/01/2021   Constipation 07/20/2019   Normocytic anemia 06/29/2019   Heart failure with reduced ejection fraction ([place]) 06/29/2019   Goals of care, counseling/discussion 06/09/2019   Lumbar stenosis with neurogenic claudication 05/17/2019   CML (chronic myelocytic leukemia) ([place]) 05/08/2019   HTN (hypertension) 12/16/2013   Atrial fibrillation with RVR ([place]) 12/16/2013    Selinda Michaels III PT, DPT 05/20/21 10:15 PM  [facility] [address] [place], [place], [address] Phone: [phone]   Fax:  [phone]  Name: MACKEY VARRICCHIO MRN: [id number] Date of Birth: Sep 09, 1941

## 2021-05-21 ENCOUNTER — Telehealth (HOSPITAL_COMMUNITY): Payer: Self-pay | Admitting: *Deleted

## 2021-05-21 NOTE — Telephone Encounter (Signed)
Attempted to call patient regarding upcoming cardiac CT appointment. Voicemail not set up and patient did not answer call.  Gordy Clement RN Navigator Cardiac Imaging [facility] [phone] Office [phone] Cell

## 2021-05-21 NOTE — Telephone Encounter (Signed)
Patient returning call regarding upcoming cardiac imaging study; pt verbalizes understanding of appt date/time, parking situation and where to check in, pre-test NPO status and medications ordered, and verified current allergies; name and call back number provided for further questions should they arise  Gordy Clement RN Navigator Cardiac Imaging [facility] [phone] office [phone] cell  Patient to take daily medications and will take Valium for anxiety one hour prior to cardiac CT scan.  He is aware he will need a driver due to taking valium.

## 2021-05-22 ENCOUNTER — Ambulatory Visit (HOSPITAL_COMMUNITY)
Admission: RE | Admit: 2021-05-22 | Discharge: 2021-05-22 | Disposition: A | Discharge status: Home / Self Care | Payer: Medicare HMO | Source: Ambulatory Visit | Attending: Internal Medicine | Admitting: Internal Medicine

## 2021-05-22 ENCOUNTER — Other Ambulatory Visit: Payer: Self-pay

## 2021-05-22 DIAGNOSIS — I4819 Other persistent atrial fibrillation: Secondary | ICD-10-CM | POA: Diagnosis not present

## 2021-05-22 MED ORDER — IOHEXOL 350 MG/ML SOLN
100.0000 mL | Freq: Once | INTRAVENOUS | Status: AC | PRN
  Administered 2021-05-22: 100 mL via INTRAVENOUS

## 2021-05-26 ENCOUNTER — Ambulatory Visit (HOSPITAL_BASED_OUTPATIENT_CLINIC_OR_DEPARTMENT_OTHER): Payer: Medicare HMO | Attending: Sports Medicine | Admitting: Physical Therapy

## 2021-05-26 ENCOUNTER — Other Ambulatory Visit: Payer: Self-pay

## 2021-05-26 ENCOUNTER — Telehealth: Payer: Self-pay | Admitting: Internal Medicine

## 2021-05-26 ENCOUNTER — Encounter (HOSPITAL_BASED_OUTPATIENT_CLINIC_OR_DEPARTMENT_OTHER): Payer: Self-pay | Admitting: Physical Therapy

## 2021-05-26 DIAGNOSIS — L720 Epidermal cyst: Secondary | ICD-10-CM | POA: Diagnosis not present

## 2021-05-26 DIAGNOSIS — R2689 Other abnormalities of gait and mobility: Secondary | ICD-10-CM

## 2021-05-26 DIAGNOSIS — L7211 Pilar cyst: Secondary | ICD-10-CM | POA: Diagnosis not present

## 2021-05-26 DIAGNOSIS — M6281 Muscle weakness (generalized): Secondary | ICD-10-CM | POA: Diagnosis not present

## 2021-05-26 DIAGNOSIS — R2681 Unsteadiness on feet: Secondary | ICD-10-CM

## 2021-05-26 NOTE — Telephone Encounter (Signed)
Lance See, MD to proceed as planned. Do no miss anymore doses of Eliquis.  Verbalized understanding

## 2021-05-26 NOTE — Telephone Encounter (Signed)
Wife of patient called. The patient has a procedure scheduled for Thursday 05/29/21. The patient was told that if he missed a dose of his blood thinner that he needed to contact the office and potentially change his procedure.  The patient did miss a dose and wanted to know how it would affect his upcoming procedure. Please advise

## 2021-05-26 NOTE — Telephone Encounter (Signed)
The patient missed one dose of Eliquis 05/25/21, morning dose. Will discuss missed dose and upcoming procedure with Dr. Rayann Heman. Advise Ann I would contact her with an update after speaking with the MD.  Verbalized understanding.

## 2021-05-27 NOTE — Therapy (Addendum)
[facility] [place], [place], [address] Phone: [phone]   Fax:  [phone]  Physical Therapy Treatment  Patient Details  Name: Lance Hubbard MRN: [id number] Date of Birth: Oct 31, 1940 Referring Provider (PT): Dr Elie Confer   Encounter Date: 05/26/2021   PT End of Session - 05/26/21 1413     Visit Number 10    Number of Visits 18    Date for PT Re-Evaluation 06/17/21    PT Start Time 9528    PT Stop Time 1430    PT Time Calculation (min) 36 min    Activity Tolerance Patient tolerated treatment well    Behavior During Therapy [facility] for tasks assessed/performed             Past Medical History:  Diagnosis Date   Arthritis    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   History of kidney stones    Hypertension    Persistent atrial fibrillation ([place])    Atrial Fibrillation    Past Surgical History:  Procedure Laterality Date   CARDIOVERSION N/A 12/18/2013   Procedure: CARDIOVERSION;  Surgeon: Birdie Riddle, MD;  Location: [facility];  Service: Cardiovascular;  Laterality: N/A;  9:58 Lido 40 mg, IV Propofol 40 mg, IV for anesthesia,  synched cardioversio @ 120 joules with successful NSR    COLONOSCOPY     KNEE ARTHROSCOPY     knee dsurgery     LEFT HEART CATHETERIZATION WITH CORONARY ANGIOGRAM N/A 12/19/2013   Procedure: LEFT HEART CATHETERIZATION WITH CORONARY ANGIOGRAM;  Surgeon: Clent Demark, MD;  Location: [place] CATH LAB;  Service: Cardiovascular;  Laterality: N/A;   LUMBAR LAMINECTOMY/DECOMPRESSION MICRODISCECTOMY N/A 05/17/2019   Procedure: Lumbar Four Lumbar Five Lumbar Laminectomy;  Surgeon: Jovita Gamma, MD;  Location: [facility];  Service: Neurosurgery;  Laterality: N/A;  Lumbar 4 Lumbar 5 Lumbar Laminectomy   TEE WITHOUT CARDIOVERSION N/A 12/18/2013   Procedure: TRANSESOPHAGEAL ECHOCARDIOGRAM (TEE);  Surgeon: Birdie Riddle, MD;  Location: [facility];  Service:  Cardiovascular;  Laterality: N/A;    There were no vitals filed for this visit.   Subjective Assessment - 05/26/21 1359     Subjective Pt states that PT is helping and he is making progress.  He continues to report of balance issues. Pt states he overdoes it and pays for the following day.  Pt states he felt fine after prior Rx and had no adverse effects.  Pt states he is having an ablation on Thursday.    Pertinent History A-fib    Currently in Pain? No/denies                [facility] PT Assessment - 05/26/21 0001       Transfers   Comments Unable to perform from chair without UEs though able to perform on elevated hi/low table                           [facility] Adult PT Treatment/Exercise - 05/26/21 0001       Neuro Re-ed    Neuro Re-ed Details  Tandem gait with bilat UE assist in // bars.  marching on airex pad with occasional UE assist on // bars 2x10.  steps ups on foam pad 3x10 reps each leg.  recriprocal stepping over hurdles with frequent UE assist inside // bars.      Exercises   Other Exercises  Reviewed response to prior Rx,  pt presentation, pain level, current function.  Ther ex performed to improve strength, function, and mobility.  Educated pt concerning POC      Knee/Hip Exercises: Aerobic   Nustep L4 x 6 mins      Knee/Hip Exercises: Machines for Strengthening   Cybex Leg Press [employer] Leg press 40, 45, and 55# x 12 reps each      Knee/Hip Exercises: Standing   Other Standing Knee Exercises lateral band walks with RTB with bilat hands on rail approx 5 laps and 3 laps    Other Standing Knee Exercises sit to stands 3x10 from high/low table without UEs                    PT Education - 05/27/21 0036     Education Details Educated pt concerning POC and answered Pt's questions satisfactorily. Educated pt he would be put on hold after cardiac ablation until MD clears pt to return.  Correct form with Sit to stand transfers.     Person(s) Educated Patient    Methods Explanation    Comprehension Verbalized understanding              PT Short Term Goals - 05/20/21 2147       PT SHORT TERM GOAL #1   Title Patient will improve tandem stance balance to contact gaurd    Time 3    Period Weeks    Status Not Met    Target Date 05/07/21      PT SHORT TERM GOAL #2   Title Patient will improve hip flexion strength to 5/5    Time 3    Period Weeks    Status Achieved    Target Date 05/07/21      PT SHORT TERM GOAL #3   Title Patient will be independent with basic HEP for strength and balance    Time 3    Period Weeks    Status Achieved    Target Date 05/07/21               PT Long Term Goals - 05/20/21 2148       PT LONG TERM GOAL #1   Title 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    Time 8    Period Weeks    Status Revised    Target Date 07/15/21      PT LONG TERM GOAL #2   Title Patient will go up and down 4 steps without increased dyspnea    Time 6    Period Weeks    Status Achieved    Target Date 05/28/21      PT LONG TERM GOAL #3   Title Patient will be independent with advanced HEP for improved strength, balance, tolerance to activity, and functional mobility    Time 8    Period Weeks    Status Revised    Target Date 07/15/21      PT LONG TERM GOAL #4   Title Pt will report improved tolerance to daily activities and improved balance with daily mobility    Time 8    Period Weeks    Status New    Target Date 07/15/21                   Plan - 05/26/21 1414     Clinical Impression Statement Pt unable to perform sit/stand transfers without UE assist from chair though able  to perform on high/low table with table elevated.  Though he has improved with sit to stand transfers, he continues to have functional LE weakness having  difficulty with performing sit/stand transfers.  He requires cuing to  stand up straight and control descent.  Pt performed balance activiites in the parallel bars and requires UE assistance on the bars for support and safety.  Added reciprocal gait over hurdles today.  Pt seemed to be fatigued with exercises though not overly fatigued.  He responded well to Rx having no c/o's or pain after Rx.  Pt will be put on hold until he is cleared by cardiac MD who performs his ablation.    Comorbidities a-fib; low back surgery; Leukemia    PT Treatment/Interventions ADLs/Self Care Home Management;Aquatic Therapy;Gait training;Stair training;Functional mobility training;Therapeutic activities;Therapeutic exercise;Balance training;Neuromuscular re-education;Patient/family education;Manual techniques    PT Next Visit Plan Pt is having a cardiac ablation on 05/29/21 and will be put on hold until after pt gets clearance to return.    PT Home Exercise Plan Access Code: [email or web address].  No changes made to his HEP.    Consulted and Agree with Plan of Care Patient             Patient will benefit from skilled therapeutic intervention in order to improve the following deficits and impairments:  Difficulty walking, Decreased endurance, Cardiopulmonary status limiting activity, Decreased activity tolerance, Decreased mobility, Decreased strength  Visit Diagnosis: Muscle weakness (generalized)  Unsteadiness on feet  Other abnormalities of gait and mobility     Problem List Patient Active Problem List   Diagnosis Date Noted   Persistent atrial fibrillation ([place]) 04/09/2021   Secondary hypercoagulable state ([place]) 04/09/2021   Weakness of both lower extremities 04/01/2021   Constipation 07/20/2019   Normocytic anemia 06/29/2019   Heart failure with reduced ejection fraction ([place]) 06/29/2019   Goals of care, counseling/discussion 06/09/2019   Lumbar stenosis with neurogenic claudication 05/17/2019   CML (chronic myelocytic leukemia) ([place]) 05/08/2019   HTN (hypertension) 12/16/2013    Atrial fibrillation with RVR ([place]) 12/16/2013    Selinda Michaels III PT, DPT 05/27/21 12:54 AM  PHYSICAL THERAPY DISCHARGE SUMMARY  Visits from Start of Care: 10  Current functional level related to goals / functional outcomes: For detailed progress and functional status, please refer to Progress Note on 05/20/21.   Remaining deficits:  For detailed progress and functional status, please refer to Progress Note on 05/20/21.  Education / Equipment: See above.  Pt has HE)   Pt was seen in PT from 04/16/21 - 05/26/21.  Pt was having a cardiac ablation performed on 05/29/21.  Pt was put on hold until clearance from cardiac MD who performed his ablation.  Pt was in agreement.  Pt to be discharged due to change in medical status and not scheduling further PT.   Selinda Michaels III PT, DPT 09/29/21 8:43 AM     [facility] [address] [place], [place], [address] Phone: [phone]   Fax:  [phone]  Name: IANMICHAEL AMESCUA MRN: [id number] Date of Birth: 1941-03-01

## 2021-05-28 ENCOUNTER — Other Ambulatory Visit (HOSPITAL_COMMUNITY): Payer: Self-pay

## 2021-05-28 MED FILL — Dasatinib Tab 50 MG: ORAL | 30 days supply | Qty: 30 | Fill #4 | Status: AC

## 2021-05-28 NOTE — Anesthesia Preprocedure Evaluation (Addendum)
Anesthesia Evaluation  Patient identified by MRN, date of birth, ID band Patient awake    Reviewed: Allergy & Precautions, NPO status , Patient's Chart, lab work & pertinent test results  Airway Mallampati: II  TM Distance: >3 FB Neck ROM: Full    Dental  (+) Partial Upper, Poor Dentition, Missing   Pulmonary neg pulmonary ROS, former smoker,    Pulmonary exam normal        Cardiovascular hypertension, Pt. on medications and Pt. on home beta blockers + dysrhythmias Atrial Fibrillation  Rhythm:Irregular Rate:Normal     Neuro/Psych negative neurological ROS  negative psych ROS   GI/Hepatic negative GI ROS, Neg liver ROS,   Endo/Other  negative endocrine ROS  Renal/GU negative Renal ROS  negative genitourinary   Musculoskeletal  (+) Arthritis ,   Abdominal (+)  Abdomen: soft.    Peds  Hematology  (+) anemia , CML   Anesthesia Other Findings   Reproductive/Obstetrics                            Anesthesia Physical Anesthesia Plan  ASA: 3  Anesthesia Plan: General   Post-op Pain Management:    Induction: Intravenous  PONV Risk Score and Plan: 2 and Ondansetron, Dexamethasone and Treatment may vary due to age or medical condition  Airway Management Planned: Mask and Oral ETT  Additional Equipment: None  Intra-op Plan:   Post-operative Plan: Extubation in OR  Informed Consent: I have reviewed the patients History and Physical, chart, labs and discussed the procedure including the risks, benefits and alternatives for the proposed anesthesia with the patient or authorized representative who has indicated his/her understanding and acceptance.     Dental advisory given  Plan Discussed with: CRNA  Anesthesia Plan Comments: (Lab Results      Component                Value               Date                      WBC                      8.0                 05/06/2021                 HGB                      12.8 (L)            05/06/2021                HCT                      37.8                05/06/2021                MCV                      87                  05/06/2021                PLT  181                 05/06/2021           Lab Results      Component                Value               Date                      NA                       137                 05/06/2021                K                        4.3                 05/06/2021                CO2                      23                  05/06/2021                GLUCOSE                  97                  05/06/2021                BUN                      16                  05/06/2021                CREATININE               1.00                05/06/2021                CALCIUM                  9.2                 05/06/2021                GFRNONAA                 >60                 09/19/2019                GFRAA                    >60                 09/19/2019           ECHO 2020: 1. Left ventricular ejection fraction, by visual estimation, is 60 to  65%. There is mildly increased left ventricular hypertrophy.  2. Global right ventricle has normal systolic function.The right  ventricular size is normal. No increase  in right ventricular wall  thickness.  3. Left atrial size was mildly dilated.  4. Right atrial size was mildly dilated.  5. Trivial pericardial effusion is present.  6. The mitral valve is normal in structure. Mild mitral valve  regurgitation. No evidence of mitral stenosis.  7. The tricuspid valve is normal in structure. Tricuspid valve  regurgitation is mild.  8. The aortic valve is normal in structure.  9. Mildly elevated pulmonary artery systolic pressure.  10. The inferior vena cava is dilated in size with <50% respiratory  variability, suggesting right atrial pressure of 15 mmHg.  )       Anesthesia Quick Evaluation

## 2021-05-29 ENCOUNTER — Ambulatory Visit (HOSPITAL_COMMUNITY)
Admission: RE | Admit: 2021-05-29 | Discharge: 2021-05-29 | Disposition: A | Discharge status: Home / Self Care | Payer: Medicare HMO | Attending: Internal Medicine | Admitting: Internal Medicine

## 2021-05-29 ENCOUNTER — Other Ambulatory Visit: Payer: Self-pay

## 2021-05-29 ENCOUNTER — Encounter (HOSPITAL_COMMUNITY): Payer: Self-pay | Admitting: Internal Medicine

## 2021-05-29 ENCOUNTER — Ambulatory Visit (HOSPITAL_COMMUNITY): Payer: Medicare HMO | Admitting: Anesthesiology

## 2021-05-29 ENCOUNTER — Encounter (HOSPITAL_COMMUNITY)
Admission: RE | Disposition: A | Discharge status: Home / Self Care | Payer: Medicare HMO | Source: Home / Self Care | Attending: Internal Medicine

## 2021-05-29 DIAGNOSIS — Z87891 Personal history of nicotine dependence: Secondary | ICD-10-CM | POA: Diagnosis not present

## 2021-05-29 DIAGNOSIS — I4819 Other persistent atrial fibrillation: Secondary | ICD-10-CM | POA: Diagnosis not present

## 2021-05-29 DIAGNOSIS — I110 Hypertensive heart disease with heart failure: Secondary | ICD-10-CM | POA: Diagnosis not present

## 2021-05-29 DIAGNOSIS — Z79899 Other long term (current) drug therapy: Secondary | ICD-10-CM | POA: Diagnosis not present

## 2021-05-29 DIAGNOSIS — I10 Essential (primary) hypertension: Secondary | ICD-10-CM | POA: Insufficient documentation

## 2021-05-29 DIAGNOSIS — Z7901 Long term (current) use of anticoagulants: Secondary | ICD-10-CM | POA: Insufficient documentation

## 2021-05-29 DIAGNOSIS — D649 Anemia, unspecified: Secondary | ICD-10-CM | POA: Diagnosis not present

## 2021-05-29 DIAGNOSIS — Z8249 Family history of ischemic heart disease and other diseases of the circulatory system: Secondary | ICD-10-CM | POA: Diagnosis not present

## 2021-05-29 DIAGNOSIS — I5020 Unspecified systolic (congestive) heart failure: Secondary | ICD-10-CM | POA: Diagnosis not present

## 2021-05-29 DIAGNOSIS — Z880 Allergy status to penicillin: Secondary | ICD-10-CM | POA: Diagnosis not present

## 2021-05-29 DIAGNOSIS — Z79890 Hormone replacement therapy (postmenopausal): Secondary | ICD-10-CM | POA: Insufficient documentation

## 2021-05-29 DIAGNOSIS — I480 Paroxysmal atrial fibrillation: Secondary | ICD-10-CM

## 2021-05-29 HISTORY — PX: ATRIAL FIBRILLATION ABLATION: EP1191

## 2021-05-29 LAB — POCT ACTIVATED CLOTTING TIME
Activated Clotting Time: 265 seconds
Activated Clotting Time: 271 seconds
Activated Clotting Time: 300 seconds

## 2021-05-29 SURGERY — ATRIAL FIBRILLATION ABLATION
Anesthesia: General

## 2021-05-29 MED ORDER — HEPARIN SODIUM (PORCINE) 1000 UNIT/ML IJ SOLN
INTRAMUSCULAR | Status: DC | PRN
  Administered 2021-05-29: 3000 [IU] via INTRAVENOUS
  Administered 2021-05-29: 5000 [IU] via INTRAVENOUS

## 2021-05-29 MED ORDER — SODIUM CHLORIDE 0.9 % IV SOLN: 250.0000 mL | INTRAVENOUS | Status: DC | PRN

## 2021-05-29 MED ORDER — SUGAMMADEX SODIUM 200 MG/2ML IV SOLN
INTRAVENOUS | Status: DC | PRN
  Administered 2021-05-29: 200 mg via INTRAVENOUS

## 2021-05-29 MED ORDER — ISOPROTERENOL HCL 0.2 MG/ML IJ SOLN
INTRAMUSCULAR | Status: AC
  Filled 2021-05-29: qty 5

## 2021-05-29 MED ORDER — SODIUM CHLORIDE 0.9 % IV SOLN: INTRAVENOUS | Status: DC

## 2021-05-29 MED ORDER — ONDANSETRON HCL 4 MG/2ML IJ SOLN
INTRAMUSCULAR | Status: DC | PRN
  Administered 2021-05-29: 4 mg via INTRAVENOUS

## 2021-05-29 MED ORDER — ISOPROTERENOL HCL 0.2 MG/ML IJ SOLN
INTRAVENOUS | Status: DC | PRN
  Administered 2021-05-29: 10 ug/min via INTRAVENOUS

## 2021-05-29 MED ORDER — FENTANYL CITRATE (PF) 100 MCG/2ML IJ SOLN
INTRAMUSCULAR | Status: DC | PRN
  Administered 2021-05-29: 100 ug via INTRAVENOUS

## 2021-05-29 MED ORDER — HEPARIN SODIUM (PORCINE) 1000 UNIT/ML IJ SOLN
INTRAMUSCULAR | Status: AC
  Filled 2021-05-29: qty 1

## 2021-05-29 MED ORDER — SODIUM CHLORIDE 0.9% FLUSH: 3.0000 mL | INTRAVENOUS | Status: DC | PRN

## 2021-05-29 MED ORDER — HEPARIN (PORCINE) IN NACL 1000-0.9 UT/500ML-% IV SOLN
INTRAVENOUS | Status: DC | PRN
  Administered 2021-05-29 (×3): 500 mL

## 2021-05-29 MED ORDER — LACTATED RINGERS IV SOLN: INTRAVENOUS | Status: DC | PRN

## 2021-05-29 MED ORDER — ROCURONIUM BROMIDE 10 MG/ML (PF) SYRINGE
PREFILLED_SYRINGE | INTRAVENOUS | Status: DC | PRN
  Administered 2021-05-29: 20 mg via INTRAVENOUS
  Administered 2021-05-29: 60 mg via INTRAVENOUS

## 2021-05-29 MED ORDER — PHENYLEPHRINE HCL-NACL 20-0.9 MG/250ML-% IV SOLN
INTRAVENOUS | Status: DC | PRN
  Administered 2021-05-29: 20 ug/min via INTRAVENOUS

## 2021-05-29 MED ORDER — HEPARIN (PORCINE) IN NACL 2000-0.9 UNIT/L-% IV SOLN
INTRAVENOUS | Status: DC | PRN
  Administered 2021-05-29: 1000 mL

## 2021-05-29 MED ORDER — PANTOPRAZOLE SODIUM 40 MG PO TBEC: 40.0000 mg | DELAYED_RELEASE_TABLET | Freq: Every day | ORAL | 0 refills | Status: DC

## 2021-05-29 MED ORDER — HEPARIN SODIUM (PORCINE) 1000 UNIT/ML IJ SOLN
INTRAMUSCULAR | Status: DC | PRN
  Administered 2021-05-29: 15000 [IU] via INTRAVENOUS

## 2021-05-29 MED ORDER — SODIUM CHLORIDE 0.9% FLUSH: 3.0000 mL | Freq: Two times a day (BID) | INTRAVENOUS | Status: DC

## 2021-05-29 MED ORDER — LIDOCAINE 2% (20 MG/ML) 5 ML SYRINGE
INTRAMUSCULAR | Status: DC | PRN
  Administered 2021-05-29: 80 mg via INTRAVENOUS

## 2021-05-29 MED ORDER — PROPOFOL 10 MG/ML IV BOLUS
INTRAVENOUS | Status: DC | PRN
  Administered 2021-05-29: 140 mg via INTRAVENOUS

## 2021-05-29 MED ORDER — HEPARIN (PORCINE) IN NACL 1000-0.9 UT/500ML-% IV SOLN
INTRAVENOUS | Status: AC
  Filled 2021-05-29: qty 1000

## 2021-05-29 MED ORDER — GLYCOPYRROLATE PF 0.2 MG/ML IJ SOSY
PREFILLED_SYRINGE | INTRAMUSCULAR | Status: DC | PRN
  Administered 2021-05-29: .2 mg via INTRAVENOUS

## 2021-05-29 MED ORDER — PHENYLEPHRINE 40 MCG/ML (10ML) SYRINGE FOR IV PUSH (FOR BLOOD PRESSURE SUPPORT)
PREFILLED_SYRINGE | INTRAVENOUS | Status: DC | PRN
  Administered 2021-05-29: 40 ug via INTRAVENOUS
  Administered 2021-05-29: 120 ug via INTRAVENOUS

## 2021-05-29 MED ORDER — PROTAMINE SULFATE 10 MG/ML IV SOLN
INTRAVENOUS | Status: DC | PRN
  Administered 2021-05-29: 40 mg via INTRAVENOUS

## 2021-05-29 MED ORDER — ACETAMINOPHEN 325 MG PO TABS
650.0000 mg | ORAL_TABLET | ORAL | Status: DC | PRN
  Filled 2021-05-29: qty 2

## 2021-05-29 MED ORDER — ONDANSETRON HCL 4 MG/2ML IJ SOLN: 4.0000 mg | Freq: Four times a day (QID) | INTRAMUSCULAR | Status: DC | PRN

## 2021-05-29 MED ORDER — EPHEDRINE SULFATE-NACL 50-0.9 MG/10ML-% IV SOSY
PREFILLED_SYRINGE | INTRAVENOUS | Status: DC | PRN
  Administered 2021-05-29: 5 mg via INTRAVENOUS
  Administered 2021-05-29 (×2): 10 mg via INTRAVENOUS

## 2021-05-29 MED ORDER — APIXABAN 5 MG PO TABS
5.0000 mg | ORAL_TABLET | Freq: Once | ORAL | Status: AC
  Administered 2021-05-29: 5 mg via ORAL
  Filled 2021-05-29: qty 1

## 2021-05-29 MED ORDER — HYDROCODONE-ACETAMINOPHEN 5-325 MG PO TABS: 1.0000 | ORAL_TABLET | ORAL | Status: DC | PRN

## 2021-05-29 MED ORDER — DEXAMETHASONE SODIUM PHOSPHATE 10 MG/ML IJ SOLN
INTRAMUSCULAR | Status: DC | PRN
  Administered 2021-05-29: 8 mg via INTRAVENOUS

## 2021-05-29 SURGICAL SUPPLY — 19 items
BLANKET WARM UNDERBOD FULL ACC (MISCELLANEOUS) ×2 IMPLANT
CATH MAPPNG PENTARAY F 2-6-2MM (CATHETERS) IMPLANT
CATH SMTCH THERMOCOOL SF DF (CATHETERS) ×1 IMPLANT
CATH SOUNDSTAR ECO 8FR (CATHETERS) ×1 IMPLANT
CATH WEBSTER BI DIR CS D-F CRV (CATHETERS) ×1 IMPLANT
CLOSURE PERCLOSE PROSTYLE (VASCULAR PRODUCTS) ×3 IMPLANT
COVER SWIFTLINK CONNECTOR (BAG) ×2 IMPLANT
MAT PREVALON FULL STRYKER (MISCELLANEOUS) ×1 IMPLANT
NDL BAYLIS TRANSSEPTAL 71CM (NEEDLE) IMPLANT
NEEDLE BAYLIS TRANSSEPTAL 71CM (NEEDLE) ×2 IMPLANT
PACK EP LATEX FREE (CUSTOM PROCEDURE TRAY) ×2
PACK EP LF (CUSTOM PROCEDURE TRAY) ×1 IMPLANT
PAD PRO RADIOLUCENT 2001M-C (PAD) ×2 IMPLANT
PENTARAY F 2-6-2MM (CATHETERS) ×2
SHEATH PINNACLE 7F 10CM (SHEATH) ×2 IMPLANT
SHEATH PINNACLE 9F 10CM (SHEATH) ×1 IMPLANT
SHEATH PROBE COVER 6X72 (BAG) ×1 IMPLANT
SHEATH SWARTZ TS SL2 63CM 8.5F (SHEATH) ×1 IMPLANT
TUBING SMART ABLATE COOLFLOW (TUBING) ×1 IMPLANT

## 2021-05-29 NOTE — Discharge Instructions (Addendum)
Post procedure care instructions No driving for 4 days. No lifting over 5 lbs for 1 week. No vigorous or sexual activity for 1 week. You may return to work/your usual activities on 06/06/21. Keep procedure site clean & dry. If you notice increased pain, swelling, bleeding or pus, call/return!  You may shower after 24 hours, but no soaking in baths/hot tubs/pools for 1 week.   Cardiac Ablation, Care After  This sheet gives you information about how to care for yourself after your procedure. Your health care provider may also give you more specific instructions. If you have problems or questions, contact your health care provider. What can I expect after the procedure? After the procedure, it is common to have: Bruising around your puncture site. Tenderness around your puncture site. Skipped heartbeats. Tiredness (fatigue).  Follow these instructions at home: Puncture site care  Follow instructions from your health care provider about how to take care of your puncture site. Make sure you: If present, leave stitches (sutures), skin glue, or adhesive strips in place. These skin closures may need to stay in place for up to 2 weeks. If adhesive strip edges start to loosen and curl up, you may trim the loose edges. Do not remove adhesive strips completely unless your health care provider tells you to do that. If a large square bandage is present, this may be removed 24 hours after surgery.  Check your puncture site every day for signs of infection. Check for: Redness, swelling, or pain. Fluid or blood. If your puncture site starts to bleed, lie down on your back, apply firm pressure to the area, and contact your health care provider. Warmth. Pus or a bad smell. Driving Do not drive for at least 4 days after your procedure or however long your health care provider recommends. (Do not resume driving if you have previously been instructed not to drive for other health reasons.) Do not drive or use  heavy machinery while taking prescription pain medicine. Activity Avoid activities that take a lot of effort for at least 7 days after your procedure. Do not lift anything that is heavier than 5 lb (4.5 kg) for one week.  No sexual activity for 1 week.  Return to your normal activities as told by your health care provider. Ask your health care provider what activities are safe for you. General instructions Take over-the-counter and prescription medicines only as told by your health care provider. Do not use any products that contain nicotine or tobacco, such as cigarettes and e-cigarettes. If you need help quitting, ask your health care provider. You may shower after 24 hours, but Do not take baths, swim, or use a hot tub for 1 week.  Do not drink alcohol for 24 hours after your procedure. Keep all follow-up visits as told by your health care provider. This is important. Contact a health care provider if: You have redness, mild swelling, or pain around your puncture site. You have fluid or blood coming from your puncture site that stops after applying firm pressure to the area. Your puncture site feels warm to the touch. You have pus or a bad smell coming from your puncture site. You have a fever. You have chest pain or discomfort that spreads to your neck, jaw, or arm. You are sweating a lot. You feel nauseous. You have a fast or irregular heartbeat. You have shortness of breath. You are dizzy or light-headed and feel the need to lie down. You have pain or numbness in  the arm or leg closest to your puncture site.     Get help right away if: Your puncture site suddenly swells. Your puncture site is bleeding and the bleeding does not stop after applying firm pressure to the area. These symptoms may represent a serious problem that is an emergency. Do not wait to see if the symptoms will go away. Get medical help right away. Call your local emergency services (911 in the [place] Do not  drive yourself to the hospital. Summary After the procedure, it is normal to have bruising and tenderness at the puncture site in your groin, neck, or forearm. Check your puncture site every day for signs of infection. Get help right away if your puncture site is bleeding and the bleeding does not stop after applying firm pressure to the area. This is a medical emergency. This information is not intended to replace advice given to you by your health care provider. Make sure you discuss any questions you have with your health care provider.   You have an appointment set up with the [facility].  Multiple studies have shown that being followed by a dedicated atrial fibrillation clinic in addition to the standard care you receive from your other physicians improves health. We believe that enrollment in the atrial fibrillation clinic will allow [place] to better care for you.   The phone number to the [facility] is [phone]. The clinic is staffed Monday through Friday from 8:30am to 5pm.  Parking Directions: The clinic is located in the Heart and Vascular Building connected to [facility]. 1)From [address] turn on to [address] and go to the 3rd entrance  (Heart and Vascular entrance) on the right. 2)Look to the right for Heart &Vascular Parking Garage. 3)A code for the entrance is required, for September is 4455.   4)Take the elevators to the 1st floor. Registration is in the room with the glass walls at the end of the hallway.  If you have any trouble parking or locating the clinic, please don't hesitate to call [phone].

## 2021-05-29 NOTE — Anesthesia Procedure Notes (Signed)
Procedure Name: Intubation Date/Time: 05/29/2021 7:50 AM Performed by: Leonor Liv, CRNA Pre-anesthesia Checklist: Patient identified, Emergency Drugs available, Suction available and Patient being monitored Patient Re-evaluated:Patient Re-evaluated prior to induction Oxygen Delivery Method: Circle System Utilized Preoxygenation: Pre-oxygenation with 100% oxygen Induction Type: IV induction Ventilation: Mask ventilation without difficulty Laryngoscope Size: Glidescope and 4 Grade View: Grade I Tube type: Oral Tube size: 7.5 mm Number of attempts: 1 Airway Equipment and Method: Stylet and Oral airway Placement Confirmation: ETT inserted through vocal cords under direct vision, positive ETCO2 and breath sounds checked- equal and bilateral Secured at: 22 cm Tube secured with: Tape Dental Injury: Teeth and Oropharynx as per pre-operative assessment  Difficulty Due To: Difficulty was anticipated, Difficult Airway- due to limited oral opening and Difficult Airway- due to large tongue

## 2021-05-29 NOTE — H&P (Signed)
PCP:  Christain Sacramento, MD       Cardiologist:  Dr Terrence Dupont Primary Electrophysiologist: Thompson Grayer, MD        CC: afib   History of Present Illness: Lance Hubbard is a 80 y.o. male who presents today for electrophysiology study and ablation of afib.    He reports being diagnosed with afib in 2015.  He has had increasing afib since that time.  He was started on amiodarone and cardioverted.  Unfortunately, his afib returned.   + fatigue and decreased exercise tolerance with afib.  He is unaware of triggers/ precipitants.   Today, he denies symptoms of palpitations, chest pain, shortness of breath, orthopnea, PND, lower extremity edema, claudication, dizziness, presyncope, syncope, bleeding, or neurologic sequela. The patient is tolerating medications without difficulties and is otherwise without complaint today.         Past Medical History:  Diagnosis Date   Arthritis     Dysrhythmia      Atrial Fibrillation   History of kidney stones     Hypertension           Past Surgical History:  Procedure Laterality Date   CARDIOVERSION N/A 12/18/2013    Procedure: CARDIOVERSION;  Surgeon: Birdie Riddle, MD;  Location: [facility];  Service: Cardiovascular;  Laterality: N/A;  9:58 Lido 40 mg, IV Propofol 40 mg, IV for anesthesia,  synched cardioversio @ 120 joules with successful NSR   COLONOSCOPY       KNEE ARTHROSCOPY       knee dsurgery       LEFT HEART CATHETERIZATION WITH CORONARY ANGIOGRAM N/A 12/19/2013    Procedure: LEFT HEART CATHETERIZATION WITH CORONARY ANGIOGRAM;  Surgeon: Clent Demark, MD;  Location: [facility] CATH LAB;  Service: Cardiovascular;  Laterality: N/A;   LUMBAR LAMINECTOMY/DECOMPRESSION MICRODISCECTOMY N/A 05/17/2019    Procedure: Lumbar Four Lumbar Five Lumbar Laminectomy;  Surgeon: Jovita Gamma, MD;  Location: [facility];  Service: Neurosurgery;  Laterality: N/A;  Lumbar 4 Lumbar 5 Lumbar Laminectomy   TEE WITHOUT CARDIOVERSION N/A 12/18/2013    Procedure: TRANSESOPHAGEAL  ECHOCARDIOGRAM (TEE);  Surgeon: Birdie Riddle, MD;  Location: [facility] ENDOSCOPY;  Service: Cardiovascular;  Laterality: N/A;              Current Outpatient Medications  Medication Sig Dispense Refill   acetaminophen (TYLENOL) 500 MG tablet Take 1,500 mg by mouth 2 (two) times a day.       amiodarone (PACERONE) 200 MG tablet Take 200 mg by mouth daily.       apixaban (ELIQUIS) 5 MG TABS tablet Take 1 tablet (5 mg total) by mouth every 12 (twelve) hours. 60 tablet 3   cyanocobalamin 1000 MCG tablet Take by mouth.       dasatinib (SPRYCEL) 50 MG tablet TAKE 1 TABLET (50 MG TOTAL) BY MOUTH DAILY. 30 tablet 11   levothyroxine (SYNTHROID) 25 MCG tablet Take 25 mcg by mouth daily.       metoprolol tartrate (LOPRESSOR) 25 MG tablet Take 12.5 mg by mouth 2 (two) times daily.       Polyethyl Glycol-Propyl Glycol (SYSTANE ULTRA) 0.4-0.3 % SOLN Place 1-2 drops into both eyes 3 (three) times daily as needed (for dry eyes).       sacubitril-valsartan (ENTRESTO) 49-51 MG 1/2 tablets in the AM, 1 tablet in PM       torsemide (DEMADEX) 20 MG tablet Take 1 tablet (20 mg total) by mouth daily as needed. 30 tablet 4  No current facility-administered medications for this visit.      Allergies:   Amoxicillin, Clavulanic acid, and Penicillin g    Social History:  The patient  reports that he quit smoking about 2 years ago. His smoking use included pipe. He has never used smokeless tobacco. He reports previous alcohol use. He reports that he does not use drugs.    Family History:  The patient's family history includes Heart disease in his father; Hypertension in his sister.      ROS:  Please see the history of present illness.   All other systems are personally reviewed and negative.     PHYSICAL EXAM: Vitals:   05/29/21 0556  BP: (!) 155/72  Pulse: (!) 50  Temp: 97.8 F (36.6 C)  SpO2: 100%    GEN: Well nourished, well developed, in no acute distress HEENT: normal Neck: no JVD, carotid bruits, or  masses Cardiac: RRR; no murmurs, rubs, or gallops,no edema Respiratory:  clear to auscultation bilaterally, normal work of breathing GI: soft, nontender, nondistended, + BS MS: no deformity or atrophy Skin: warm and dry Neuro:  Strength and sensation are intact Psych: euthymic mood, full affect      ASSESSMENT AND PLAN:   1.  Persistent afib The patient has symptomatic, recurrent  atrial fibrillation. he has failed medical therapy with amiodarone. Chads2vasc score is at least 3.  he is anticoagulated with eliquis .   Risk, benefits, and alternatives to EP study and radiofrequency ablation for afib were again discussed in detail today. These risks include but are not limited to stroke, bleeding, vascular damage, tamponade, perforation, damage to the esophagus, lungs, and other structures, pulmonary vein stenosis, worsening renal function, and death. The patient understands these risk and wishes to proceed.    Cardiac CT reviewed at length with the patient today.  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  He states that this is a known issue and followed by Oncology at [facility].  he reports compliance with [place] without interruption.  Thompson Grayer MD, [place] 05/29/2021 7:24 AM

## 2021-05-29 NOTE — Anesthesia Postprocedure Evaluation (Signed)
Anesthesia Post Note  Patient: Lance Hubbard  Procedure(s) Performed: ATRIAL FIBRILLATION ABLATION     Patient location during evaluation: PACU Anesthesia Type: General Level of consciousness: awake and alert Pain management: pain level controlled Vital Signs Assessment: post-procedure vital signs reviewed and stable Respiratory status: spontaneous breathing, nonlabored ventilation, respiratory function stable and patient connected to nasal cannula oxygen Cardiovascular status: blood pressure returned to baseline and stable Postop Assessment: no apparent nausea or vomiting Anesthetic complications: no   No notable events documented.  Last Vitals:  Vitals:   05/29/21 1308 05/29/21 1338  BP: 139/61 (!) 155/67  Pulse: (!) 47 (!) 51  Resp: 18 (!) 22  Temp:    SpO2: 95% 97%    Last Pain:  Vitals:   05/29/21 1051  TempSrc:   PainSc: 0-No pain                 Belenda Cruise P Norelle [place]

## 2021-05-29 NOTE — Transfer of Care (Signed)
Immediate Anesthesia Transfer of Care Note  Patient: Lance Hubbard  Procedure(s) Performed: ATRIAL FIBRILLATION ABLATION  Patient Location: Cath Lab  Anesthesia Type:General  Level of Consciousness: drowsy and patient cooperative  Airway & Oxygen Therapy: Patient Spontanous Breathing and Patient connected to nasal cannula oxygen  Post-op Assessment: Report given to RN and Post -op Vital signs reviewed and stable  Post vital signs: Reviewed and stable  Last Vitals:  Vitals Value Taken Time  BP 126/59 05/29/21 1009  Temp    Pulse 56 05/29/21 1009  Resp 22 05/29/21 1009  SpO2 93 % 05/29/21 1009  Vitals shown include unvalidated device data.  Last Pain:  Vitals:   05/29/21 0958  TempSrc:   PainSc: 0-No pain         Complications: No notable events documented.

## 2021-06-02 ENCOUNTER — Other Ambulatory Visit (HOSPITAL_COMMUNITY): Payer: Self-pay

## 2021-06-06 ENCOUNTER — Encounter: Payer: Self-pay | Admitting: *Deleted

## 2021-06-14 ENCOUNTER — Telehealth: Payer: Self-pay | Admitting: Home Health

## 2021-06-14 NOTE — Telephone Encounter (Signed)
Patient's wife Lelon Frohlich called reporting patient with high BP. Patient see Dr Terrence Dupont for general cardiology and Dr Rayann Heman for A fib, advised wife to call patient's primary cardiologist Dr Terrence Dupont office for further advised on BP meds. Wife agreed.

## 2021-06-19 ENCOUNTER — Telehealth (HOSPITAL_COMMUNITY): Payer: Self-pay | Admitting: *Deleted

## 2021-06-19 NOTE — Telephone Encounter (Signed)
Pt wife called stating Dr Lyla Son adjusted his entresto b/c since ablation his BP has been elevated SBP in the 160-170. Pt metoprolol was stopped post ablation by Dr Rayann Heman -- his HR in the 47s currently. Wife questioning should they restart metoprolol - instructed to call Dr. Terrence Dupont as BP medication maybe needed that does not effect his HR. Pt wife states she will call Dr. Terrence Dupont.

## 2021-06-20 DIAGNOSIS — J9811 Atelectasis: Secondary | ICD-10-CM | POA: Diagnosis not present

## 2021-06-20 DIAGNOSIS — Z7901 Long term (current) use of anticoagulants: Secondary | ICD-10-CM | POA: Diagnosis not present

## 2021-06-20 DIAGNOSIS — E538 Deficiency of other specified B group vitamins: Secondary | ICD-10-CM | POA: Diagnosis not present

## 2021-06-20 DIAGNOSIS — I110 Hypertensive heart disease with heart failure: Secondary | ICD-10-CM | POA: Diagnosis not present

## 2021-06-20 DIAGNOSIS — I4891 Unspecified atrial fibrillation: Secondary | ICD-10-CM | POA: Diagnosis not present

## 2021-06-20 DIAGNOSIS — I482 Chronic atrial fibrillation, unspecified: Secondary | ICD-10-CM | POA: Diagnosis not present

## 2021-06-20 DIAGNOSIS — Z87891 Personal history of nicotine dependence: Secondary | ICD-10-CM | POA: Diagnosis not present

## 2021-06-20 DIAGNOSIS — I10 Essential (primary) hypertension: Secondary | ICD-10-CM | POA: Diagnosis not present

## 2021-06-20 DIAGNOSIS — R0602 Shortness of breath: Secondary | ICD-10-CM | POA: Diagnosis not present

## 2021-06-20 DIAGNOSIS — Z8679 Personal history of other diseases of the circulatory system: Secondary | ICD-10-CM | POA: Diagnosis not present

## 2021-06-20 DIAGNOSIS — G8929 Other chronic pain: Secondary | ICD-10-CM | POA: Diagnosis not present

## 2021-06-20 DIAGNOSIS — C9210 Chronic myeloid leukemia, BCR/ABL-positive, not having achieved remission: Secondary | ICD-10-CM | POA: Diagnosis not present

## 2021-06-20 DIAGNOSIS — Z881 Allergy status to other antibiotic agents status: Secondary | ICD-10-CM | POA: Diagnosis not present

## 2021-06-20 DIAGNOSIS — Z79899 Other long term (current) drug therapy: Secondary | ICD-10-CM | POA: Diagnosis not present

## 2021-06-20 DIAGNOSIS — M549 Dorsalgia, unspecified: Secondary | ICD-10-CM | POA: Diagnosis not present

## 2021-06-20 DIAGNOSIS — I5020 Unspecified systolic (congestive) heart failure: Secondary | ICD-10-CM | POA: Diagnosis not present

## 2021-06-20 DIAGNOSIS — J90 Pleural effusion, not elsewhere classified: Secondary | ICD-10-CM | POA: Diagnosis not present

## 2021-06-23 DIAGNOSIS — C9210 Chronic myeloid leukemia, BCR/ABL-positive, not having achieved remission: Secondary | ICD-10-CM | POA: Diagnosis not present

## 2021-06-24 ENCOUNTER — Other Ambulatory Visit (HOSPITAL_COMMUNITY): Payer: Self-pay

## 2021-06-26 ENCOUNTER — Ambulatory Visit (HOSPITAL_COMMUNITY)
Admission: RE | Admit: 2021-06-26 | Discharge: 2021-06-26 | Disposition: A | Discharge status: Home / Self Care | Payer: Medicare HMO | Source: Ambulatory Visit | Attending: Physician Assistant | Admitting: Physician Assistant

## 2021-06-26 ENCOUNTER — Other Ambulatory Visit: Payer: Self-pay

## 2021-06-26 ENCOUNTER — Encounter (HOSPITAL_COMMUNITY): Payer: Self-pay | Admitting: Physician Assistant

## 2021-06-26 VITALS — BP 162/92 | HR 58 | Ht 74.0 in | Wt 219.2 lb

## 2021-06-26 DIAGNOSIS — I110 Hypertensive heart disease with heart failure: Secondary | ICD-10-CM | POA: Diagnosis not present

## 2021-06-26 DIAGNOSIS — E039 Hypothyroidism, unspecified: Secondary | ICD-10-CM | POA: Insufficient documentation

## 2021-06-26 DIAGNOSIS — Z7901 Long term (current) use of anticoagulants: Secondary | ICD-10-CM | POA: Insufficient documentation

## 2021-06-26 DIAGNOSIS — Z8249 Family history of ischemic heart disease and other diseases of the circulatory system: Secondary | ICD-10-CM | POA: Insufficient documentation

## 2021-06-26 DIAGNOSIS — I4819 Other persistent atrial fibrillation: Secondary | ICD-10-CM | POA: Diagnosis not present

## 2021-06-26 DIAGNOSIS — Z09 Encounter for follow-up examination after completed treatment for conditions other than malignant neoplasm: Secondary | ICD-10-CM | POA: Insufficient documentation

## 2021-06-26 DIAGNOSIS — E785 Hyperlipidemia, unspecified: Secondary | ICD-10-CM | POA: Insufficient documentation

## 2021-06-26 DIAGNOSIS — Z87891 Personal history of nicotine dependence: Secondary | ICD-10-CM | POA: Insufficient documentation

## 2021-06-26 DIAGNOSIS — Z79899 Other long term (current) drug therapy: Secondary | ICD-10-CM | POA: Diagnosis not present

## 2021-06-26 DIAGNOSIS — I5020 Unspecified systolic (congestive) heart failure: Secondary | ICD-10-CM | POA: Diagnosis not present

## 2021-06-26 DIAGNOSIS — D6869 Other thrombophilia: Secondary | ICD-10-CM

## 2021-06-26 NOTE — Progress Notes (Signed)
Primary Care Physician: Aura Dials, MD Primary Cardiologist: Dr Terrence Dupont  Primary Electrophysiologist: Dr Rayann Heman Referring Physician: Dr Sherril Cong is a 80 y.o. male with a history of HTN, systolic dysfunction, hypothyroidism, CML, HLD, tobacco abuse, and atrial fibrillation who presents for follow up in the [facility].  The patient was initially diagnosed with atrial fibrillation in 2015. He underwent DCCV at that time and has been maintained on amiodarone. Patient is on Eliquis for a CHADS2VASC score of 4. He was in his usual state of health until the beginning of May 2022 when he was noted to be back in afib with rapid rates. His amiodarone was increased and he was started on metoprolol.  On follow up today, patient is s/p afib ablation 05/29/21 with Dr Rayann Heman. He reports that he has done well with no heart racing or palpitations. He denies CP, swallowing pain, or groin issues. He has noted an elevated BP since stopping BB and his HTN meds are being adjusted by Dr Terrence Dupont.   Today, he denies symptoms of palpitations, chest pain, shortness of breath, orthopnea, PND, lower extremity edema, dizziness, presyncope, syncope, snoring, daytime somnolence, bleeding, or neurologic sequela. The patient is tolerating medications without difficulties and is otherwise without complaint today.    Atrial Fibrillation Risk Factors:  he does not have symptoms or diagnosis of sleep apnea. he does not have a history of rheumatic fever. he does not have a history of alcohol use.   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   History of kidney stones    Hypertension    Persistent atrial fibrillation (HCC)    Atrial Fibrillation   Past Surgical History:  Procedure Laterality Date   ATRIAL FIBRILLATION ABLATION N/A 05/29/2021   Procedure: ATRIAL FIBRILLATION ABLATION;  Surgeon: Thompson Grayer, MD;  Location: [facility] CV LAB;  Service: Cardiovascular;  Laterality: N/A;   CARDIOVERSION N/A 12/18/2013   Procedure: CARDIOVERSION;  Surgeon: Birdie Riddle, MD;  Location: [facility] ENDOSCOPY;  Service: Cardiovascular;  Laterality: N/A;  9:58 Lido 40 mg, IV Propofol 40 mg, IV for anesthesia,  synched cardioversio @ 120 joules with successful NSR    COLONOSCOPY     KNEE ARTHROSCOPY     knee dsurgery     LEFT HEART CATHETERIZATION WITH CORONARY ANGIOGRAM N/A 12/19/2013   Procedure: LEFT HEART CATHETERIZATION WITH CORONARY ANGIOGRAM;  Surgeon: Clent Demark, MD;  Location: [facility] CATH LAB;  Service: Cardiovascular;  Laterality: N/A;   LUMBAR LAMINECTOMY/DECOMPRESSION MICRODISCECTOMY N/A 05/17/2019   Procedure: Lumbar Four Lumbar Five Lumbar Laminectomy;  Surgeon: Jovita Gamma, MD;  Location: [facility];  Service: Neurosurgery;  Laterality: N/A;  Lumbar 4 Lumbar 5 Lumbar Laminectomy   TEE WITHOUT CARDIOVERSION N/A 12/18/2013   Procedure: TRANSESOPHAGEAL ECHOCARDIOGRAM (TEE);  Surgeon: Birdie Riddle, MD;  Location: [facility] ENDOSCOPY;  Service: Cardiovascular;  Laterality: N/A;    Current Outpatient Medications  Medication Sig Dispense Refill   acetaminophen (TYLENOL) 650 MG  CR tablet Take 650 mg by mouth every 8 (eight) hours as needed for pain.     amiodarone (PACERONE) 200 MG tablet Take 200 mg by mouth daily.     apixaban (ELIQUIS) 5 MG TABS tablet Take 1 tablet (5 mg total) by mouth every 12 (twelve) hours. 60 tablet 3   clobetasol cream (TEMOVATE) [id number] % Apply 1 application  topically 2 (two) times daily as needed (rash).     cyanocobalamin 1000 MCG tablet Take 1,000 mcg by mouth daily.     dasatinib (SPRYCEL) 50 MG tablet TAKE 1 TABLET (50 MG TOTAL) BY MOUTH DAILY. 30 tablet 11   hydrocortisone cream 1 % Apply 1 application topically 2 (two) times daily as needed for itching.     levothyroxine (SYNTHROID) 25 MCG tablet Take 25 mcg by mouth daily before breakfast.     pantoprazole (PROTONIX) 40 MG tablet Take 1 tablet (40 mg total) by mouth daily. 45 tablet 0   Polyethyl Glycol-Propyl Glycol (SYSTANE ULTRA) 0.4-0.3 % SOLN Place 1-2 drops into both eyes 3 (three) times daily as needed (for dry eyes).     sacubitril-valsartan (ENTRESTO) 49-51 MG Take 1.5 tablets by mouth 2 (two) times daily.     torsemide (DEMADEX) 10 MG tablet Take 10-20 mg by mouth daily as needed (fluid).     No current facility-administered medications for this encounter.    Allergies  Allergen Reactions   Amoxicillin Hives and Other (See Comments)    REACTION: whelps/pain in knees and ankles   Penicillin G     Other reaction(s): hives    Social History   Socioeconomic History   Marital status: Married    Spouse name: Not on file   Number of children: Not on file   Years of education: Not on file   Highest education level: Not on file  Occupational History   Not on file  Tobacco Use   Smoking status: Former    Types: Pipe    Quit date: 11/08/2018    Years since quitting: 2.6   Smokeless tobacco: Never   Tobacco comments:    Former smoker 06/26/2021  Vaping Use   Vaping Use: Never used  Substance and Sexual Activity   Alcohol use: Not Currently    Comment: stopped around 2012   Drug use: No   Sexual activity: Not on file  Other Topics Concern   Not on file  Social History Narrative   Lives in [place] with spouse.   Retired Chief Financial Officer in [place] Strain: Not on [employer] Insecurity: Not on file   Transportation Needs: Not on file  Physical Activity: Not on file  Stress: Not on file  Social Connections: Not on file  Intimate Partner Violence: Not on file     ROS- All systems are reviewed and negative except as per the HPI above.  Physical Exam: Vitals:   06/26/21 1142  BP: (!) 162/92  Pulse: (!) 58  Weight: 99.4 kg  Height: '6\' 2"'$  (1.88 m)    GEN- The patient is a well appearing elderly male, alert and oriented x 3 today.   HEENT-head normocephalic, atraumatic, sclera clear, conjunctiva pink, hearing intact, trachea midline. Lungs- Clear to ausculation bilaterally, normal work of breathing Heart- Regular rate and rhythm, no murmurs, rubs or gallops  GI- soft, NT, ND, + BS Extremities- no clubbing, cyanosis, or edema MS- no significant deformity or atrophy Skin- no rash or lesion  Psych- euthymic mood, full affect Neuro- strength and sensation are intact   Wt Readings from Last 3 Encounters:  06/26/21 99.4 kg  05/29/21 99.3 kg  04/11/21 103 kg    EKG today demonstrates  SB Vent. rate 58 BPM PR interval 184 ms QRS duration 110 ms QT/QTcB 460/451 ms  Echo 09/26/19 demonstrated   1. Left ventricular ejection fraction, by visual estimation, is 60 to  65%. There is mildly increased left ventricular hypertrophy.   2. Global right ventricle has normal systolic function.The right  ventricular size is normal. No increase in right ventricular wall  thickness.   3. Left atrial size was mildly dilated.   4. Right atrial size was mildly dilated.   5. Trivial pericardial effusion is present.   6. The mitral valve is normal in structure. Mild mitral valve  regurgitation. No evidence of mitral stenosis.   7. The tricuspid valve is normal in structure. Tricuspid valve  regurgitation is mild.   8. The aortic valve is normal in structure.   9. Mildly elevated pulmonary artery systolic pressure.  10. The inferior vena cava is dilated in size with <50% respiratory   variability, suggesting right atrial pressure of 15 mmHg.   Epic records are reviewed at length today  CHA2DS2-VASc Score = 4  The patient's score is based upon: CHF History: Yes HTN History: Yes Diabetes History: No Stroke History: No Vascular Disease History: No Age Score: 2 Gender Score: 0     ASSESSMENT AND PLAN: 1. Persistent Atrial Fibrillation (ICD10:  I48.19) The patient's CHA2DS2-VASc score is 4, indicating a 4.8% annual risk of stroke.   S/p afib ablation 05/29/21 Patient appears to be maintaining SR. Continue amiodarone 200 mg daily for now. Continue Eliquis 5 mg BID with no missed doses for 3 months post ablation.   2. Secondary Hypercoagulable State (ICD10:  D68.69) The patient is at significant risk for stroke/thromboembolism based upon his CHA2DS2-VASc Score of 4.  Continue Apixaban (Eliquis).   3. HTN Stable, no changes today.  4. Systolic dysfunction EF normalized on echo 09/2019. No signs or symptoms of fluid overload.   Follow up with Dr Rayann Heman as scheduled.    [facility] [address] [place], [place] [address] [phone] 06/26/2021 11:49 AM

## 2021-07-05 ENCOUNTER — Other Ambulatory Visit: Payer: Self-pay | Admitting: Internal Medicine

## 2021-07-07 NOTE — Telephone Encounter (Signed)
Pt's pharmacy is requesting a refill on pantoprazole. Would Dr. Allred like to refill this medication? Please address 

## 2021-07-09 ENCOUNTER — Other Ambulatory Visit (HOSPITAL_COMMUNITY): Payer: Self-pay

## 2021-07-11 ENCOUNTER — Other Ambulatory Visit (HOSPITAL_COMMUNITY): Payer: Self-pay

## 2021-07-11 DIAGNOSIS — C9210 Chronic myeloid leukemia, BCR/ABL-positive, not having achieved remission: Secondary | ICD-10-CM | POA: Diagnosis not present

## 2021-07-11 DIAGNOSIS — D513 Other dietary vitamin B12 deficiency anemia: Secondary | ICD-10-CM | POA: Diagnosis not present

## 2021-07-11 DIAGNOSIS — Z8709 Personal history of other diseases of the respiratory system: Secondary | ICD-10-CM | POA: Diagnosis not present

## 2021-07-11 DIAGNOSIS — I4891 Unspecified atrial fibrillation: Secondary | ICD-10-CM | POA: Diagnosis not present

## 2021-07-11 DIAGNOSIS — J90 Pleural effusion, not elsewhere classified: Secondary | ICD-10-CM | POA: Diagnosis not present

## 2021-07-11 DIAGNOSIS — C9211 Chronic myeloid leukemia, BCR/ABL-positive, in remission: Secondary | ICD-10-CM | POA: Diagnosis not present

## 2021-07-11 DIAGNOSIS — I5020 Unspecified systolic (congestive) heart failure: Secondary | ICD-10-CM | POA: Diagnosis not present

## 2021-07-11 DIAGNOSIS — I10 Essential (primary) hypertension: Secondary | ICD-10-CM | POA: Diagnosis not present

## 2021-07-11 DIAGNOSIS — Z9221 Personal history of antineoplastic chemotherapy: Secondary | ICD-10-CM | POA: Diagnosis not present

## 2021-07-11 DIAGNOSIS — E538 Deficiency of other specified B group vitamins: Secondary | ICD-10-CM | POA: Diagnosis not present

## 2021-07-11 DIAGNOSIS — Z79899 Other long term (current) drug therapy: Secondary | ICD-10-CM | POA: Diagnosis not present

## 2021-07-11 DIAGNOSIS — M545 Low back pain, unspecified: Secondary | ICD-10-CM | POA: Diagnosis not present

## 2021-07-11 DIAGNOSIS — Z7901 Long term (current) use of anticoagulants: Secondary | ICD-10-CM | POA: Diagnosis not present

## 2021-07-11 DIAGNOSIS — M5489 Other dorsalgia: Secondary | ICD-10-CM | POA: Diagnosis not present

## 2021-07-11 DIAGNOSIS — G8929 Other chronic pain: Secondary | ICD-10-CM | POA: Diagnosis not present

## 2021-07-11 DIAGNOSIS — R5383 Other fatigue: Secondary | ICD-10-CM | POA: Diagnosis not present

## 2021-07-11 MED ORDER — SPRYCEL 20 MG PO TABS
40.0000 mg | ORAL_TABLET | Freq: Every day | ORAL | 11 refills | Status: DC
  Filled 2021-07-14: qty 10, 5d supply, fill #0
  Filled 2021-07-15: qty 50, 25d supply, fill #0
  Filled 2021-08-08: qty 60, 30d supply, fill #1
  Filled 2021-09-04: qty 60, 30d supply, fill #2
  Filled 2021-10-02: qty 60, 30d supply, fill #3
  Filled 2021-10-29: qty 60, 30d supply, fill #4
  Filled 2021-12-01 – 2021-12-05 (×2): qty 60, 30d supply, fill #5

## 2021-07-14 ENCOUNTER — Other Ambulatory Visit (HOSPITAL_COMMUNITY): Payer: Self-pay

## 2021-07-14 DIAGNOSIS — C9210 Chronic myeloid leukemia, BCR/ABL-positive, not having achieved remission: Secondary | ICD-10-CM | POA: Diagnosis not present

## 2021-07-15 ENCOUNTER — Other Ambulatory Visit (HOSPITAL_COMMUNITY): Payer: Self-pay

## 2021-07-17 ENCOUNTER — Other Ambulatory Visit (HOSPITAL_COMMUNITY): Payer: Self-pay

## 2021-07-18 DIAGNOSIS — R69 Illness, unspecified: Secondary | ICD-10-CM | POA: Diagnosis not present

## 2021-07-18 DIAGNOSIS — I509 Heart failure, unspecified: Secondary | ICD-10-CM | POA: Diagnosis not present

## 2021-07-18 DIAGNOSIS — I480 Paroxysmal atrial fibrillation: Secondary | ICD-10-CM | POA: Diagnosis not present

## 2021-07-18 DIAGNOSIS — E785 Hyperlipidemia, unspecified: Secondary | ICD-10-CM | POA: Diagnosis not present

## 2021-07-18 DIAGNOSIS — I10 Essential (primary) hypertension: Secondary | ICD-10-CM | POA: Diagnosis not present

## 2021-07-22 ENCOUNTER — Other Ambulatory Visit (HOSPITAL_COMMUNITY): Payer: Self-pay

## 2021-08-04 ENCOUNTER — Other Ambulatory Visit (HOSPITAL_COMMUNITY): Payer: Self-pay

## 2021-08-06 ENCOUNTER — Other Ambulatory Visit (HOSPITAL_COMMUNITY): Payer: Self-pay

## 2021-08-08 ENCOUNTER — Other Ambulatory Visit (HOSPITAL_COMMUNITY): Payer: Self-pay

## 2021-08-11 ENCOUNTER — Other Ambulatory Visit (HOSPITAL_COMMUNITY): Payer: Self-pay

## 2021-08-22 DIAGNOSIS — E538 Deficiency of other specified B group vitamins: Secondary | ICD-10-CM | POA: Diagnosis not present

## 2021-08-22 DIAGNOSIS — J90 Pleural effusion, not elsewhere classified: Secondary | ICD-10-CM | POA: Diagnosis not present

## 2021-08-22 DIAGNOSIS — I110 Hypertensive heart disease with heart failure: Secondary | ICD-10-CM | POA: Diagnosis not present

## 2021-08-22 DIAGNOSIS — Z87891 Personal history of nicotine dependence: Secondary | ICD-10-CM | POA: Diagnosis not present

## 2021-08-22 DIAGNOSIS — R5383 Other fatigue: Secondary | ICD-10-CM | POA: Diagnosis not present

## 2021-08-22 DIAGNOSIS — I5020 Unspecified systolic (congestive) heart failure: Secondary | ICD-10-CM | POA: Diagnosis not present

## 2021-08-22 DIAGNOSIS — M5489 Other dorsalgia: Secondary | ICD-10-CM | POA: Diagnosis not present

## 2021-08-22 DIAGNOSIS — I4891 Unspecified atrial fibrillation: Secondary | ICD-10-CM | POA: Diagnosis not present

## 2021-08-22 DIAGNOSIS — Z23 Encounter for immunization: Secondary | ICD-10-CM | POA: Diagnosis not present

## 2021-08-22 DIAGNOSIS — Z79899 Other long term (current) drug therapy: Secondary | ICD-10-CM | POA: Diagnosis not present

## 2021-08-22 DIAGNOSIS — Z9221 Personal history of antineoplastic chemotherapy: Secondary | ICD-10-CM | POA: Diagnosis not present

## 2021-08-22 DIAGNOSIS — R002 Palpitations: Secondary | ICD-10-CM | POA: Diagnosis not present

## 2021-08-22 DIAGNOSIS — G8929 Other chronic pain: Secondary | ICD-10-CM | POA: Diagnosis not present

## 2021-08-22 DIAGNOSIS — C9210 Chronic myeloid leukemia, BCR/ABL-positive, not having achieved remission: Secondary | ICD-10-CM | POA: Diagnosis not present

## 2021-08-22 DIAGNOSIS — Z7901 Long term (current) use of anticoagulants: Secondary | ICD-10-CM | POA: Diagnosis not present

## 2021-08-25 DIAGNOSIS — C9210 Chronic myeloid leukemia, BCR/ABL-positive, not having achieved remission: Secondary | ICD-10-CM | POA: Diagnosis not present

## 2021-09-01 ENCOUNTER — Ambulatory Visit: Payer: Medicare HMO | Admitting: Internal Medicine

## 2021-09-01 ENCOUNTER — Other Ambulatory Visit: Payer: Self-pay

## 2021-09-01 ENCOUNTER — Encounter: Payer: Self-pay | Admitting: Internal Medicine

## 2021-09-01 VITALS — BP 138/82 | HR 59 | Ht 74.0 in | Wt 233.0 lb

## 2021-09-01 DIAGNOSIS — I4819 Other persistent atrial fibrillation: Secondary | ICD-10-CM

## 2021-09-01 DIAGNOSIS — I10 Essential (primary) hypertension: Secondary | ICD-10-CM | POA: Diagnosis not present

## 2021-09-01 NOTE — Patient Instructions (Signed)
Medication Instructions:  Stop Amiodarone Your physician recommends that you continue on your current medications as directed. Please refer to the Current Medication list given to you today. *If you need a refill on your cardiac medications before your next appointment, please call your pharmacy*  Lab Work: None. If you have labs (blood work) drawn today and your tests are completely normal, you will receive your results only by: [place] (if you have MyChart) OR A paper copy in the mail If you have any lab test that is abnormal or we need to change your treatment, we will call you to review the results.  Testing/Procedures: None.  Follow-Up: At [facility], you and your health needs are our priority.  As part of our continuing mission to provide you with exceptional heart care, we have created designated Provider Care Teams.  These Care Teams include your primary Cardiologist (physician) and Advanced Practice Providers (APPs -  Physician Assistants and Nurse Practitioners) who all work together to provide you with the care you need, when you need it.  Your physician wants you to follow-up in: 12/08/21 at 10:20 am with  one of the following Advanced Practice Providers on your designated Care Team:    Legrand Como "Jonni Sanger" [place], [place]   We recommend signing up for the patient portal called "MyChart".  Sign up information is provided on this After Visit Summary.  MyChart is used to connect with patients for Virtual Visits (Telemedicine).  Patients are able to view lab/test results, encounter notes, upcoming appointments, etc.  Non-urgent messages can be sent to your provider as well.   To learn more about what you can do with MyChart, go to [email or web address].    Any Other Special Instructions Will Be Listed Below (If Applicable).

## 2021-09-01 NOTE — Progress Notes (Signed)
PCP: Aura Dials, MD Primary Cardiologist: Dr Sherril Cong is a 80 y.o. male who presents today for routine electrophysiology followup.  Since his recent afib ablation, the patient reports doing very well.  he denies procedure related complications and is pleased with the results of the procedure.  He has had some swelling and pulmonary edema over the past few months which was due to dasatinib.  These symptoms have resolved since stopping this medicine.  Today, he denies symptoms of palpitations, chest pain, shortness of breath,  lower extremity edema, dizziness, presyncope, or syncope.  The patient is otherwise without complaint today.   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   History of kidney stones    Hypertension    Persistent atrial fibrillation (HCC)    Atrial Fibrillation   Past Surgical History:  Procedure Laterality Date   ATRIAL FIBRILLATION ABLATION N/A 05/29/2021   Procedure: ATRIAL FIBRILLATION ABLATION;  Surgeon: Thompson Grayer, MD;  Location: [place] [facility];  Service: Cardiovascular;  Laterality: N/A;   CARDIOVERSION N/A 12/18/2013   Procedure: CARDIOVERSION;  Surgeon: Birdie Riddle, MD;  Location: [facility];  Service: Cardiovascular;  Laterality: N/A;  9:58 Lido 40 mg, IV Propofol 40 mg, IV for anesthesia,  synched cardioversio @ 120 joules with successful NSR    COLONOSCOPY     KNEE ARTHROSCOPY     knee dsurgery     LEFT HEART CATHETERIZATION WITH CORONARY ANGIOGRAM N/A 12/19/2013   Procedure: LEFT HEART CATHETERIZATION WITH CORONARY ANGIOGRAM;  Surgeon: Clent Demark, MD;  Location: [place] CATH [facility];  Service: Cardiovascular;  Laterality: N/A;   LUMBAR LAMINECTOMY/DECOMPRESSION MICRODISCECTOMY N/A 05/17/2019   Procedure: Lumbar Four Lumbar Five Lumbar Laminectomy;  Surgeon: Jovita Gamma, MD;  Location: [place];  Service: Neurosurgery;  Laterality: N/A;  Lumbar 4 Lumbar 5 Lumbar  Laminectomy   TEE WITHOUT CARDIOVERSION N/A 12/18/2013   Procedure: TRANSESOPHAGEAL ECHOCARDIOGRAM (TEE);  Surgeon: Birdie Riddle, MD;  [facility] ENDOSCOPY;  Service: Cardiovascular;  Laterality: N/A;    ROS- all systems are personally reviewed and negatives except as per HPI above  Current Outpatient Medications  Medication Sig Dispense Refill   acetaminophen (TYLENOL) 650 MG CR tablet Take 650 mg by mouth every 8 (eight) hours as needed for pain.     amiodarone (PACERONE) 200 MG tablet Take 200 mg by mouth daily.     apixaban (ELIQUIS) 5 MG TABS tablet Take 1 tablet (5 mg total) by mouth every 12 (twelve) hours. 60 tablet 3   clobetasol cream (TEMOVATE) 1.28 % Apply 1 application topically 2 (two) times daily as needed (rash).     cyanocobalamin 1000 MCG tablet Take 1,000 mcg by mouth daily.     dasatinib (SPRYCEL) 20 MG tablet Take 2 tablets (40 mg total) by mouth daily. 60 tablet 11   hydrocortisone cream 1 % Apply 1 application topically 2 (two) times daily as needed for itching.     levothyroxine (SYNTHROID) 25 MCG tablet Take 25 mcg by mouth daily before breakfast.     Polyethyl Glycol-Propyl Glycol (SYSTANE ULTRA) 0.4-0.3 % SOLN Place 1-2 drops into both eyes 3 (three) times daily as needed (for dry eyes).     sacubitril-valsartan (ENTRESTO) 49-51 MG Take 1.5 tablets by mouth 2 (two) times daily.     torsemide (DEMADEX) 10 MG tablet Take 10-20 mg by mouth daily as needed (fluid).  dasatinib (SPRYCEL) 50 MG tablet TAKE 1 TABLET (50 MG TOTAL) BY MOUTH DAILY. (Patient not taking: Reported on 09/01/2021) 30 tablet 11   No current facility-administered medications for this visit.    Physical Exam: Vitals:   09/01/21 1439  BP: 138/82  Pulse: (!) 59  SpO2: 98%  Weight: 233 lb (105.7 kg)  Height: 6\' 2"  (1.88 m)    GEN- The patient is well appearing, alert and oriented x 3 today.   Head- normocephalic, atraumatic Eyes-  Sclera clear, conjunctiva pink Ears- hearing  intact Oropharynx- clear Lungs- Clear to ausculation bilaterally, normal work of breathing Heart- Regular rate and rhythm, no murmurs, rubs or gallops, PMI not laterally displaced GI- soft, NT, ND, + BS Extremities- no clubbing, cyanosis, or edema  EKG tracing ordered today is personally reviewed and shows sinus  Assessment and Plan:  1. Paroxysmal atrial fibrillation Doing well s/p ablation chads2vasc score is 3 Continue eliquis stop amiodarone  2. HTN Stable No change required today  3. Chronic diastolic dysfunction Recently worsened by datasinib.  He will need close follow-up with oncology while on this medicine  Return to see EP APP in 3 months  Thompson Grayer MD, [facility] 09/01/2021 2:53 PM

## 2021-09-04 ENCOUNTER — Other Ambulatory Visit (HOSPITAL_BASED_OUTPATIENT_CLINIC_OR_DEPARTMENT_OTHER): Payer: Self-pay

## 2021-09-04 ENCOUNTER — Other Ambulatory Visit (HOSPITAL_COMMUNITY): Payer: Self-pay

## 2021-09-04 MED ORDER — INFLUENZA VAC A&B SA ADJ QUAD 0.5 ML IM PRSY
PREFILLED_SYRINGE | INTRAMUSCULAR | 0 refills | Status: DC
  Filled 2021-09-04: qty 0.5, 1d supply, fill #0

## 2021-09-08 ENCOUNTER — Other Ambulatory Visit (HOSPITAL_COMMUNITY): Payer: Self-pay

## 2021-09-11 NOTE — Addendum Note (Signed)
Addended by: Drue Novel I on: 09/11/2021 09:05 PM   Modules accepted: Orders

## 2021-10-02 ENCOUNTER — Other Ambulatory Visit (HOSPITAL_COMMUNITY): Payer: Self-pay

## 2021-10-06 ENCOUNTER — Other Ambulatory Visit (HOSPITAL_COMMUNITY): Payer: Self-pay

## 2021-10-10 DIAGNOSIS — I5020 Unspecified systolic (congestive) heart failure: Secondary | ICD-10-CM | POA: Diagnosis not present

## 2021-10-10 DIAGNOSIS — I10 Essential (primary) hypertension: Secondary | ICD-10-CM | POA: Diagnosis not present

## 2021-10-10 DIAGNOSIS — I480 Paroxysmal atrial fibrillation: Secondary | ICD-10-CM | POA: Diagnosis not present

## 2021-10-10 DIAGNOSIS — E785 Hyperlipidemia, unspecified: Secondary | ICD-10-CM | POA: Diagnosis not present

## 2021-10-10 DIAGNOSIS — R69 Illness, unspecified: Secondary | ICD-10-CM | POA: Diagnosis not present

## 2021-10-29 ENCOUNTER — Other Ambulatory Visit (HOSPITAL_COMMUNITY): Payer: Self-pay

## 2021-11-03 ENCOUNTER — Other Ambulatory Visit (HOSPITAL_COMMUNITY): Payer: Self-pay

## 2021-11-19 ENCOUNTER — Other Ambulatory Visit (HOSPITAL_COMMUNITY): Payer: Self-pay

## 2021-11-20 DIAGNOSIS — R208 Other disturbances of skin sensation: Secondary | ICD-10-CM | POA: Diagnosis not present

## 2021-11-20 DIAGNOSIS — L728 Other follicular cysts of the skin and subcutaneous tissue: Secondary | ICD-10-CM | POA: Diagnosis not present

## 2021-11-20 DIAGNOSIS — L7211 Pilar cyst: Secondary | ICD-10-CM | POA: Diagnosis not present

## 2021-11-20 DIAGNOSIS — L538 Other specified erythematous conditions: Secondary | ICD-10-CM | POA: Diagnosis not present

## 2021-11-24 ENCOUNTER — Other Ambulatory Visit (HOSPITAL_COMMUNITY): Payer: Self-pay

## 2021-11-25 ENCOUNTER — Other Ambulatory Visit (HOSPITAL_COMMUNITY): Payer: Self-pay

## 2021-12-01 ENCOUNTER — Other Ambulatory Visit (HOSPITAL_COMMUNITY): Payer: Self-pay

## 2021-12-05 ENCOUNTER — Other Ambulatory Visit (HOSPITAL_COMMUNITY): Payer: Self-pay

## 2021-12-05 DIAGNOSIS — R609 Edema, unspecified: Secondary | ICD-10-CM | POA: Diagnosis not present

## 2021-12-05 DIAGNOSIS — Z7901 Long term (current) use of anticoagulants: Secondary | ICD-10-CM | POA: Diagnosis not present

## 2021-12-05 DIAGNOSIS — C9210 Chronic myeloid leukemia, BCR/ABL-positive, not having achieved remission: Secondary | ICD-10-CM | POA: Diagnosis not present

## 2021-12-05 DIAGNOSIS — M5489 Other dorsalgia: Secondary | ICD-10-CM | POA: Diagnosis not present

## 2021-12-05 DIAGNOSIS — I509 Heart failure, unspecified: Secondary | ICD-10-CM | POA: Diagnosis not present

## 2021-12-05 DIAGNOSIS — I4891 Unspecified atrial fibrillation: Secondary | ICD-10-CM | POA: Diagnosis not present

## 2021-12-05 DIAGNOSIS — C9211 Chronic myeloid leukemia, BCR/ABL-positive, in remission: Secondary | ICD-10-CM | POA: Diagnosis not present

## 2021-12-05 DIAGNOSIS — G8929 Other chronic pain: Secondary | ICD-10-CM | POA: Diagnosis not present

## 2021-12-05 DIAGNOSIS — R5383 Other fatigue: Secondary | ICD-10-CM | POA: Diagnosis not present

## 2021-12-05 DIAGNOSIS — E538 Deficiency of other specified B group vitamins: Secondary | ICD-10-CM | POA: Diagnosis not present

## 2021-12-05 DIAGNOSIS — I110 Hypertensive heart disease with heart failure: Secondary | ICD-10-CM | POA: Diagnosis not present

## 2021-12-05 DIAGNOSIS — I5020 Unspecified systolic (congestive) heart failure: Secondary | ICD-10-CM | POA: Diagnosis not present

## 2021-12-08 ENCOUNTER — Ambulatory Visit: Payer: Medicare HMO | Admitting: Student

## 2021-12-10 DIAGNOSIS — C9210 Chronic myeloid leukemia, BCR/ABL-positive, not having achieved remission: Secondary | ICD-10-CM | POA: Diagnosis not present

## 2021-12-24 ENCOUNTER — Other Ambulatory Visit (HOSPITAL_COMMUNITY): Payer: Self-pay

## 2021-12-25 ENCOUNTER — Other Ambulatory Visit (HOSPITAL_COMMUNITY): Payer: Self-pay

## 2021-12-29 ENCOUNTER — Other Ambulatory Visit (HOSPITAL_COMMUNITY): Payer: Self-pay

## 2022-02-16 ENCOUNTER — Other Ambulatory Visit (HOSPITAL_COMMUNITY): Payer: Self-pay

## 2022-02-21 DIAGNOSIS — U071 COVID-19: Secondary | ICD-10-CM | POA: Diagnosis not present

## 2022-03-13 DIAGNOSIS — E538 Deficiency of other specified B group vitamins: Secondary | ICD-10-CM | POA: Diagnosis not present

## 2022-03-13 DIAGNOSIS — E782 Mixed hyperlipidemia: Secondary | ICD-10-CM | POA: Diagnosis not present

## 2022-03-13 DIAGNOSIS — I10 Essential (primary) hypertension: Secondary | ICD-10-CM | POA: Diagnosis not present

## 2022-03-13 DIAGNOSIS — C9210 Chronic myeloid leukemia, BCR/ABL-positive, not having achieved remission: Secondary | ICD-10-CM | POA: Diagnosis not present

## 2022-03-13 DIAGNOSIS — G629 Polyneuropathy, unspecified: Secondary | ICD-10-CM | POA: Diagnosis not present

## 2022-03-13 DIAGNOSIS — N5201 Erectile dysfunction due to arterial insufficiency: Secondary | ICD-10-CM | POA: Diagnosis not present

## 2022-03-13 DIAGNOSIS — Z0000 Encounter for general adult medical examination without abnormal findings: Secondary | ICD-10-CM | POA: Diagnosis not present

## 2022-03-13 DIAGNOSIS — I4811 Longstanding persistent atrial fibrillation: Secondary | ICD-10-CM | POA: Diagnosis not present

## 2022-03-13 DIAGNOSIS — N400 Enlarged prostate without lower urinary tract symptoms: Secondary | ICD-10-CM | POA: Diagnosis not present

## 2022-03-13 DIAGNOSIS — E039 Hypothyroidism, unspecified: Secondary | ICD-10-CM | POA: Diagnosis not present

## 2022-03-19 DIAGNOSIS — I480 Paroxysmal atrial fibrillation: Secondary | ICD-10-CM | POA: Diagnosis not present

## 2022-03-19 DIAGNOSIS — E785 Hyperlipidemia, unspecified: Secondary | ICD-10-CM | POA: Diagnosis not present

## 2022-03-19 DIAGNOSIS — E039 Hypothyroidism, unspecified: Secondary | ICD-10-CM | POA: Diagnosis not present

## 2022-03-19 DIAGNOSIS — I10 Essential (primary) hypertension: Secondary | ICD-10-CM | POA: Diagnosis not present

## 2022-03-19 DIAGNOSIS — I5020 Unspecified systolic (congestive) heart failure: Secondary | ICD-10-CM | POA: Diagnosis not present

## 2022-03-20 DIAGNOSIS — Z79899 Other long term (current) drug therapy: Secondary | ICD-10-CM | POA: Diagnosis not present

## 2022-03-20 DIAGNOSIS — Z8616 Personal history of COVID-19: Secondary | ICD-10-CM | POA: Diagnosis not present

## 2022-03-20 DIAGNOSIS — C9211 Chronic myeloid leukemia, BCR/ABL-positive, in remission: Secondary | ICD-10-CM | POA: Diagnosis not present

## 2022-03-20 DIAGNOSIS — I4891 Unspecified atrial fibrillation: Secondary | ICD-10-CM | POA: Diagnosis not present

## 2022-03-20 DIAGNOSIS — Z87891 Personal history of nicotine dependence: Secondary | ICD-10-CM | POA: Diagnosis not present

## 2022-03-20 DIAGNOSIS — I5020 Unspecified systolic (congestive) heart failure: Secondary | ICD-10-CM | POA: Diagnosis not present

## 2022-03-20 DIAGNOSIS — R5383 Other fatigue: Secondary | ICD-10-CM | POA: Diagnosis not present

## 2022-03-20 DIAGNOSIS — R002 Palpitations: Secondary | ICD-10-CM | POA: Diagnosis not present

## 2022-03-20 DIAGNOSIS — I110 Hypertensive heart disease with heart failure: Secondary | ICD-10-CM | POA: Diagnosis not present

## 2022-03-20 DIAGNOSIS — E538 Deficiency of other specified B group vitamins: Secondary | ICD-10-CM | POA: Diagnosis not present

## 2022-03-20 DIAGNOSIS — M549 Dorsalgia, unspecified: Secondary | ICD-10-CM | POA: Diagnosis not present

## 2022-03-20 DIAGNOSIS — Z7901 Long term (current) use of anticoagulants: Secondary | ICD-10-CM | POA: Diagnosis not present

## 2022-03-20 DIAGNOSIS — C9210 Chronic myeloid leukemia, BCR/ABL-positive, not having achieved remission: Secondary | ICD-10-CM | POA: Diagnosis not present

## 2022-03-20 DIAGNOSIS — R609 Edema, unspecified: Secondary | ICD-10-CM | POA: Diagnosis not present

## 2022-03-25 DIAGNOSIS — C9210 Chronic myeloid leukemia, BCR/ABL-positive, not having achieved remission: Secondary | ICD-10-CM | POA: Diagnosis not present

## 2022-03-30 ENCOUNTER — Other Ambulatory Visit (HOSPITAL_COMMUNITY): Payer: Self-pay

## 2022-06-26 DIAGNOSIS — I110 Hypertensive heart disease with heart failure: Secondary | ICD-10-CM | POA: Diagnosis not present

## 2022-06-26 DIAGNOSIS — E538 Deficiency of other specified B group vitamins: Secondary | ICD-10-CM | POA: Diagnosis not present

## 2022-06-26 DIAGNOSIS — C9211 Chronic myeloid leukemia, BCR/ABL-positive, in remission: Secondary | ICD-10-CM | POA: Diagnosis not present

## 2022-06-26 DIAGNOSIS — I5020 Unspecified systolic (congestive) heart failure: Secondary | ICD-10-CM | POA: Diagnosis not present

## 2022-06-26 DIAGNOSIS — Z7901 Long term (current) use of anticoagulants: Secondary | ICD-10-CM | POA: Diagnosis not present

## 2022-06-26 DIAGNOSIS — D513 Other dietary vitamin B12 deficiency anemia: Secondary | ICD-10-CM | POA: Diagnosis not present

## 2022-06-26 DIAGNOSIS — C9210 Chronic myeloid leukemia, BCR/ABL-positive, not having achieved remission: Secondary | ICD-10-CM | POA: Diagnosis not present

## 2022-06-26 DIAGNOSIS — Z79899 Other long term (current) drug therapy: Secondary | ICD-10-CM | POA: Diagnosis not present

## 2022-06-26 DIAGNOSIS — I4891 Unspecified atrial fibrillation: Secondary | ICD-10-CM | POA: Diagnosis not present

## 2022-06-30 DIAGNOSIS — C9210 Chronic myeloid leukemia, BCR/ABL-positive, not having achieved remission: Secondary | ICD-10-CM | POA: Diagnosis not present

## 2022-07-14 DIAGNOSIS — H5201 Hypermetropia, right eye: Secondary | ICD-10-CM | POA: Diagnosis not present

## 2022-07-20 DIAGNOSIS — N489 Disorder of penis, unspecified: Secondary | ICD-10-CM | POA: Diagnosis not present

## 2022-07-27 DIAGNOSIS — E785 Hyperlipidemia, unspecified: Secondary | ICD-10-CM | POA: Diagnosis not present

## 2022-07-27 DIAGNOSIS — I10 Essential (primary) hypertension: Secondary | ICD-10-CM | POA: Diagnosis not present

## 2022-07-27 DIAGNOSIS — I5020 Unspecified systolic (congestive) heart failure: Secondary | ICD-10-CM | POA: Diagnosis not present

## 2022-07-27 DIAGNOSIS — E039 Hypothyroidism, unspecified: Secondary | ICD-10-CM | POA: Diagnosis not present

## 2022-07-27 DIAGNOSIS — I480 Paroxysmal atrial fibrillation: Secondary | ICD-10-CM | POA: Diagnosis not present

## 2022-07-29 DIAGNOSIS — N481 Balanitis: Secondary | ICD-10-CM | POA: Diagnosis not present

## 2022-07-29 DIAGNOSIS — N471 Phimosis: Secondary | ICD-10-CM | POA: Diagnosis not present

## 2022-08-10 ENCOUNTER — Other Ambulatory Visit: Payer: Self-pay | Admitting: Ophthalmology

## 2022-08-10 DIAGNOSIS — Q542 Hypospadias, penoscrotal: Secondary | ICD-10-CM | POA: Diagnosis not present

## 2022-08-10 DIAGNOSIS — N471 Phimosis: Secondary | ICD-10-CM | POA: Diagnosis not present

## 2022-08-10 DIAGNOSIS — N4889 Other specified disorders of penis: Secondary | ICD-10-CM | POA: Diagnosis not present

## 2022-08-10 DIAGNOSIS — L900 Lichen sclerosus et atrophicus: Secondary | ICD-10-CM | POA: Diagnosis not present

## 2022-08-10 DIAGNOSIS — N481 Balanitis: Secondary | ICD-10-CM | POA: Diagnosis not present

## 2022-08-10 DIAGNOSIS — N480 Leukoplakia of penis: Secondary | ICD-10-CM | POA: Diagnosis not present

## 2022-08-20 DIAGNOSIS — J398 Other specified diseases of upper respiratory tract: Secondary | ICD-10-CM | POA: Diagnosis not present

## 2022-09-08 DIAGNOSIS — N481 Balanitis: Secondary | ICD-10-CM | POA: Diagnosis not present

## 2022-09-08 DIAGNOSIS — N5201 Erectile dysfunction due to arterial insufficiency: Secondary | ICD-10-CM | POA: Diagnosis not present

## 2022-09-18 DIAGNOSIS — Z880 Allergy status to penicillin: Secondary | ICD-10-CM | POA: Diagnosis not present

## 2022-09-18 DIAGNOSIS — E785 Hyperlipidemia, unspecified: Secondary | ICD-10-CM | POA: Diagnosis not present

## 2022-09-18 DIAGNOSIS — C9210 Chronic myeloid leukemia, BCR/ABL-positive, not having achieved remission: Secondary | ICD-10-CM | POA: Diagnosis not present

## 2022-09-18 DIAGNOSIS — I4891 Unspecified atrial fibrillation: Secondary | ICD-10-CM | POA: Diagnosis not present

## 2022-09-18 DIAGNOSIS — Z87891 Personal history of nicotine dependence: Secondary | ICD-10-CM | POA: Diagnosis not present

## 2022-09-18 DIAGNOSIS — C9211 Chronic myeloid leukemia, BCR/ABL-positive, in remission: Secondary | ICD-10-CM | POA: Diagnosis not present

## 2022-09-18 DIAGNOSIS — M549 Dorsalgia, unspecified: Secondary | ICD-10-CM | POA: Diagnosis not present

## 2022-09-18 DIAGNOSIS — E538 Deficiency of other specified B group vitamins: Secondary | ICD-10-CM | POA: Diagnosis not present

## 2022-09-18 DIAGNOSIS — Z7901 Long term (current) use of anticoagulants: Secondary | ICD-10-CM | POA: Diagnosis not present

## 2022-09-18 DIAGNOSIS — I5020 Unspecified systolic (congestive) heart failure: Secondary | ICD-10-CM | POA: Diagnosis not present

## 2022-09-18 DIAGNOSIS — Z79899 Other long term (current) drug therapy: Secondary | ICD-10-CM | POA: Diagnosis not present

## 2022-09-18 DIAGNOSIS — I110 Hypertensive heart disease with heart failure: Secondary | ICD-10-CM | POA: Diagnosis not present

## 2022-09-21 DIAGNOSIS — C9210 Chronic myeloid leukemia, BCR/ABL-positive, not having achieved remission: Secondary | ICD-10-CM | POA: Diagnosis not present

## 2022-09-30 ENCOUNTER — Other Ambulatory Visit (HOSPITAL_BASED_OUTPATIENT_CLINIC_OR_DEPARTMENT_OTHER): Payer: Self-pay

## 2022-09-30 MED ORDER — FLUAD QUADRIVALENT 0.5 ML IM PRSY
PREFILLED_SYRINGE | INTRAMUSCULAR | 0 refills | Status: DC
  Filled 2022-09-30: qty 0.5, 1d supply, fill #0

## 2022-10-20 ENCOUNTER — Other Ambulatory Visit (HOSPITAL_BASED_OUTPATIENT_CLINIC_OR_DEPARTMENT_OTHER): Payer: Self-pay

## 2022-10-20 MED ORDER — AREXVY 120 MCG/0.5ML IM SUSR
INTRAMUSCULAR | 0 refills | Status: DC
  Filled 2022-10-20: qty 0.5, 1d supply, fill #0

## 2022-10-26 DIAGNOSIS — R69 Illness, unspecified: Secondary | ICD-10-CM | POA: Diagnosis not present

## 2022-11-03 DIAGNOSIS — I5020 Unspecified systolic (congestive) heart failure: Secondary | ICD-10-CM | POA: Diagnosis not present

## 2022-11-03 DIAGNOSIS — I10 Essential (primary) hypertension: Secondary | ICD-10-CM | POA: Diagnosis not present

## 2022-11-03 DIAGNOSIS — I480 Paroxysmal atrial fibrillation: Secondary | ICD-10-CM | POA: Diagnosis not present

## 2022-11-03 DIAGNOSIS — E785 Hyperlipidemia, unspecified: Secondary | ICD-10-CM | POA: Diagnosis not present

## 2022-11-26 DIAGNOSIS — R69 Illness, unspecified: Secondary | ICD-10-CM | POA: Diagnosis not present

## 2022-12-18 DIAGNOSIS — Z880 Allergy status to penicillin: Secondary | ICD-10-CM | POA: Diagnosis not present

## 2022-12-18 DIAGNOSIS — Z79899 Other long term (current) drug therapy: Secondary | ICD-10-CM | POA: Diagnosis not present

## 2022-12-18 DIAGNOSIS — Z87891 Personal history of nicotine dependence: Secondary | ICD-10-CM | POA: Diagnosis not present

## 2022-12-18 DIAGNOSIS — E538 Deficiency of other specified B group vitamins: Secondary | ICD-10-CM | POA: Diagnosis not present

## 2022-12-18 DIAGNOSIS — E785 Hyperlipidemia, unspecified: Secondary | ICD-10-CM | POA: Diagnosis not present

## 2022-12-18 DIAGNOSIS — I5020 Unspecified systolic (congestive) heart failure: Secondary | ICD-10-CM | POA: Diagnosis not present

## 2022-12-18 DIAGNOSIS — I4891 Unspecified atrial fibrillation: Secondary | ICD-10-CM | POA: Diagnosis not present

## 2022-12-18 DIAGNOSIS — Z7901 Long term (current) use of anticoagulants: Secondary | ICD-10-CM | POA: Diagnosis not present

## 2022-12-18 DIAGNOSIS — I110 Hypertensive heart disease with heart failure: Secondary | ICD-10-CM | POA: Diagnosis not present

## 2022-12-18 DIAGNOSIS — C9211 Chronic myeloid leukemia, BCR/ABL-positive, in remission: Secondary | ICD-10-CM | POA: Diagnosis not present

## 2022-12-18 DIAGNOSIS — C9210 Chronic myeloid leukemia, BCR/ABL-positive, not having achieved remission: Secondary | ICD-10-CM | POA: Diagnosis not present

## 2022-12-25 DIAGNOSIS — R69 Illness, unspecified: Secondary | ICD-10-CM | POA: Diagnosis not present

## 2023-01-25 DIAGNOSIS — R69 Illness, unspecified: Secondary | ICD-10-CM | POA: Diagnosis not present

## 2023-02-02 DIAGNOSIS — I10 Essential (primary) hypertension: Secondary | ICD-10-CM | POA: Diagnosis not present

## 2023-02-02 DIAGNOSIS — E782 Mixed hyperlipidemia: Secondary | ICD-10-CM | POA: Diagnosis not present

## 2023-02-02 DIAGNOSIS — I5020 Unspecified systolic (congestive) heart failure: Secondary | ICD-10-CM | POA: Diagnosis not present

## 2023-02-02 DIAGNOSIS — Z8679 Personal history of other diseases of the circulatory system: Secondary | ICD-10-CM | POA: Diagnosis not present

## 2023-02-03 ENCOUNTER — Ambulatory Visit: Payer: Medicare HMO | Admitting: Family Medicine

## 2023-02-03 VITALS — BP 142/70 | Ht 73.5 in | Wt 230.0 lb

## 2023-02-03 DIAGNOSIS — D485 Neoplasm of uncertain behavior of skin: Secondary | ICD-10-CM | POA: Diagnosis not present

## 2023-02-03 DIAGNOSIS — D225 Melanocytic nevi of trunk: Secondary | ICD-10-CM | POA: Diagnosis not present

## 2023-02-03 DIAGNOSIS — R29898 Other symptoms and signs involving the musculoskeletal system: Secondary | ICD-10-CM | POA: Diagnosis not present

## 2023-02-03 DIAGNOSIS — C44612 Basal cell carcinoma of skin of right upper limb, including shoulder: Secondary | ICD-10-CM | POA: Diagnosis not present

## 2023-02-03 NOTE — Patient Instructions (Signed)
Start physical therapy with Lance Hubbard. We need to work on hip and gluteal strengthening to give you more confidence and reduce your risk of falling. Follow up with me in 6 weeks for reevaluation.

## 2023-02-04 NOTE — Progress Notes (Signed)
PCP: Henrine Screws, MD  Subjective:   HPI: Patient is a 82 y.o. male here for balance issues.  Patient here to discuss getting a referral for strengthening of lower extremities. He had seen me previously for lumbar spine 4 years ago - underwent laminectomy and microdiscectomy. Overall improved but then 2 years ago came in for similar weakness, concern for falls and weakness - underwent physical therapy. Here for similar feeling. Has had about 3 falls in past 3 years but none recently. Using a cane to help get around as he's not confident in his walking. Gait has changed some as well per family. Unable to get up onto his toes. He still works out at [employer] though feels more weakness the couple days after working out. No bowel/bladder dysfunction.  Past Medical History:  Diagnosis Date   Arthritis    CML (chronic myelocytic leukemia) (HCC)    in remission   History of kidney stones    Hypertension    Persistent atrial fibrillation (HCC)    Atrial Fibrillation    Current Outpatient Medications on File Prior to Visit  Medication Sig Dispense Refill   acetaminophen (TYLENOL) 650 MG CR tablet Take 650 mg by mouth every 8 (eight) hours as needed for pain.     apixaban (ELIQUIS) 5 MG TABS tablet Take 1 tablet (5 mg total) by mouth every 12 (twelve) hours. 60 tablet 3   clobetasol cream (TEMOVATE) 0.05 % Apply 1 application topically 2 (two) times daily as needed (rash).     cyanocobalamin 1000 MCG tablet Take 1,000 mcg by mouth daily.     dasatinib (SPRYCEL) 20 MG tablet Take 2 tablets (40 mg total) by mouth daily. 60 tablet 11   hydrocortisone cream 1 % Apply 1 application topically 2 (two) times daily as needed for itching.     influenza vaccine adjuvanted (FLUAD QUADRIVALENT) 0.5 ML injection Inject into the muscle. 0.5 mL 0   influenza vaccine adjuvanted (FLUAD) 0.5 ML injection Inject into the muscle. 0.5 mL 0   levothyroxine (SYNTHROID) 25 MCG tablet Take 25 mcg by mouth daily  before breakfast.     Polyethyl Glycol-Propyl Glycol (SYSTANE ULTRA) 0.4-0.3 % SOLN Place 1-2 drops into both eyes 3 (three) times daily as needed (for dry eyes).     RSV vaccine recomb adjuvanted (AREXVY) 120 MCG/0.5ML injection Inject into the muscle. 0.5 mL 0   sacubitril-valsartan (ENTRESTO) 49-51 MG Take 1.5 tablets by mouth 2 (two) times daily.     torsemide (DEMADEX) 10 MG tablet Take 10-20 mg by mouth daily as needed (fluid).     No current facility-administered medications on file prior to visit.    Past Surgical History:  Procedure Laterality Date   ATRIAL FIBRILLATION ABLATION N/A 05/29/2021   Procedure: ATRIAL FIBRILLATION ABLATION;  Surgeon: Hillis Range, MD;  Location: [facility];  Service: Cardiovascular;  Laterality: N/A;   CARDIOVERSION N/A 12/18/2013   Procedure: CARDIOVERSION;  Surgeon: Ricki Rodriguez, MD;  Location: [facility]OS[facility];  Service: Cardiovascular;  Laterality: N/A;  9:58 Lido 40 mg, IV Propofol 40 mg, IV for anesthesia,  synched cardioversio @ 120 joules with successful NSR    COLONOSCOPY     KNEE ARTHROSCOPY     knee dsurgery     LEFT HEART CATHETERIZATION WITH CORONARY ANGIOGRAM N/A 12/19/2013   Procedure: LEFT HEART CATHETERIZATION WITH CORONARY ANGIOGRAM;  Surgeon: Robynn Pane, MD;  Location: [facility];  Service: Cardiovascular;  Laterality: N/A;   LUMBAR LAMINECTOMY/DECOMPRESSION MICRODISCECTOMY  N/A 05/17/2019   Procedure: Lumbar Four Lumbar Five Lumbar Laminectomy;  Surgeon: Shirlean Kelly, MD;  [facility];  Service: Neurosurgery;  Laterality: N/A;  Lumbar 4 Lumbar 5 Lumbar Laminectomy   TEE WITHOUT CARDIOVERSION N/A 12/18/2013   Procedure: TRANSESOPHAGEAL ECHOCARDIOGRAM (TEE);  Surgeon: Ricki Rodriguez, MD;  Location: [facility] ENDOSCOPY;  Service: Cardiovascular;  Laterality: N/A;    Allergies  Allergen Reactions   Amoxicillin Hives and Other (See Comments)    REACTION: whelps/pain in knees and ankles   Penicillin G     Other reaction(s):  hives    BP (!) 142/70   Ht 6' 1.5" (1.867 m)   Wt 230 lb (104.3 kg)   BMI 29.93 kg/m       No data to display              No data to display              Objective:  Physical Exam:  Gen: NAD, comfortable in exam room  Back: No gross deformity, scoliosis. No paraspinal TTP.  No midline or bony TTP. FROM. Strength LEs 5/5 all muscle groups except 4/5 right plantarflexion, bilateral hip flexion.   1+ MSRs in patellar and achilles tendons, equal bilaterally. Negative SLRs. Sensation intact to light touch bilaterally.  Gait: slow shuffling gait.  No outturning of feet.  No pedestal turning. Unable to get up from chair without pushing up with arms.   Assessment & Plan:  1. Bilateral lower extremity weakness - will start physical therapy to work on lower extremity strengthening.  Cane to help with stability.  No medications needed at this time.  Can consider lumbar spine reevaluation if not improving.  F/u in 6 weeks.  Total visit time 25 minutes including documentation.

## 2023-02-12 DIAGNOSIS — M6281 Muscle weakness (generalized): Secondary | ICD-10-CM | POA: Diagnosis not present

## 2023-02-12 DIAGNOSIS — M79606 Pain in leg, unspecified: Secondary | ICD-10-CM | POA: Diagnosis not present

## 2023-02-12 DIAGNOSIS — R2689 Other abnormalities of gait and mobility: Secondary | ICD-10-CM | POA: Diagnosis not present

## 2023-02-18 DIAGNOSIS — M6281 Muscle weakness (generalized): Secondary | ICD-10-CM | POA: Diagnosis not present

## 2023-02-18 DIAGNOSIS — R2689 Other abnormalities of gait and mobility: Secondary | ICD-10-CM | POA: Diagnosis not present

## 2023-02-18 DIAGNOSIS — M79606 Pain in leg, unspecified: Secondary | ICD-10-CM | POA: Diagnosis not present

## 2023-03-01 DIAGNOSIS — R2689 Other abnormalities of gait and mobility: Secondary | ICD-10-CM | POA: Diagnosis not present

## 2023-03-01 DIAGNOSIS — M6281 Muscle weakness (generalized): Secondary | ICD-10-CM | POA: Diagnosis not present

## 2023-03-01 DIAGNOSIS — M79606 Pain in leg, unspecified: Secondary | ICD-10-CM | POA: Diagnosis not present

## 2023-03-01 DIAGNOSIS — R69 Illness, unspecified: Secondary | ICD-10-CM | POA: Diagnosis not present

## 2023-03-05 DIAGNOSIS — R2689 Other abnormalities of gait and mobility: Secondary | ICD-10-CM | POA: Diagnosis not present

## 2023-03-05 DIAGNOSIS — M6281 Muscle weakness (generalized): Secondary | ICD-10-CM | POA: Diagnosis not present

## 2023-03-05 DIAGNOSIS — M79606 Pain in leg, unspecified: Secondary | ICD-10-CM | POA: Diagnosis not present

## 2023-03-10 DIAGNOSIS — M79606 Pain in leg, unspecified: Secondary | ICD-10-CM | POA: Diagnosis not present

## 2023-03-10 DIAGNOSIS — M6281 Muscle weakness (generalized): Secondary | ICD-10-CM | POA: Diagnosis not present

## 2023-03-10 DIAGNOSIS — R2689 Other abnormalities of gait and mobility: Secondary | ICD-10-CM | POA: Diagnosis not present

## 2023-03-15 ENCOUNTER — Ambulatory Visit: Payer: Medicare HMO | Admitting: Family Medicine

## 2023-03-15 DIAGNOSIS — R2689 Other abnormalities of gait and mobility: Secondary | ICD-10-CM | POA: Diagnosis not present

## 2023-03-15 DIAGNOSIS — M79606 Pain in leg, unspecified: Secondary | ICD-10-CM | POA: Diagnosis not present

## 2023-03-15 DIAGNOSIS — M6281 Muscle weakness (generalized): Secondary | ICD-10-CM | POA: Diagnosis not present

## 2023-03-16 DIAGNOSIS — M5136 Other intervertebral disc degeneration, lumbar region: Secondary | ICD-10-CM | POA: Diagnosis not present

## 2023-03-16 DIAGNOSIS — C9210 Chronic myeloid leukemia, BCR/ABL-positive, not having achieved remission: Secondary | ICD-10-CM | POA: Diagnosis not present

## 2023-03-16 DIAGNOSIS — M25511 Pain in right shoulder: Secondary | ICD-10-CM | POA: Diagnosis not present

## 2023-03-16 DIAGNOSIS — Z23 Encounter for immunization: Secondary | ICD-10-CM | POA: Diagnosis not present

## 2023-03-16 DIAGNOSIS — I10 Essential (primary) hypertension: Secondary | ICD-10-CM | POA: Diagnosis not present

## 2023-03-16 DIAGNOSIS — I4891 Unspecified atrial fibrillation: Secondary | ICD-10-CM | POA: Diagnosis not present

## 2023-03-16 DIAGNOSIS — E039 Hypothyroidism, unspecified: Secondary | ICD-10-CM | POA: Diagnosis not present

## 2023-03-16 DIAGNOSIS — E782 Mixed hyperlipidemia: Secondary | ICD-10-CM | POA: Diagnosis not present

## 2023-03-16 DIAGNOSIS — Z0000 Encounter for general adult medical examination without abnormal findings: Secondary | ICD-10-CM | POA: Diagnosis not present

## 2023-03-16 DIAGNOSIS — N400 Enlarged prostate without lower urinary tract symptoms: Secondary | ICD-10-CM | POA: Diagnosis not present

## 2023-03-16 DIAGNOSIS — G629 Polyneuropathy, unspecified: Secondary | ICD-10-CM | POA: Diagnosis not present

## 2023-03-16 DIAGNOSIS — E538 Deficiency of other specified B group vitamins: Secondary | ICD-10-CM | POA: Diagnosis not present

## 2023-03-17 ENCOUNTER — Ambulatory Visit: Payer: Medicare HMO | Admitting: Family Medicine

## 2023-03-18 DIAGNOSIS — R2689 Other abnormalities of gait and mobility: Secondary | ICD-10-CM | POA: Diagnosis not present

## 2023-03-18 DIAGNOSIS — M6281 Muscle weakness (generalized): Secondary | ICD-10-CM | POA: Diagnosis not present

## 2023-03-18 DIAGNOSIS — M79606 Pain in leg, unspecified: Secondary | ICD-10-CM | POA: Diagnosis not present

## 2023-03-19 DIAGNOSIS — E538 Deficiency of other specified B group vitamins: Secondary | ICD-10-CM | POA: Diagnosis not present

## 2023-03-19 DIAGNOSIS — C9211 Chronic myeloid leukemia, BCR/ABL-positive, in remission: Secondary | ICD-10-CM | POA: Diagnosis not present

## 2023-03-19 DIAGNOSIS — C9210 Chronic myeloid leukemia, BCR/ABL-positive, not having achieved remission: Secondary | ICD-10-CM | POA: Diagnosis not present

## 2023-03-27 DIAGNOSIS — R69 Illness, unspecified: Secondary | ICD-10-CM | POA: Diagnosis not present

## 2023-04-26 DIAGNOSIS — R69 Illness, unspecified: Secondary | ICD-10-CM | POA: Diagnosis not present

## 2023-05-27 DIAGNOSIS — R69 Illness, unspecified: Secondary | ICD-10-CM | POA: Diagnosis not present

## 2023-06-07 DIAGNOSIS — I5022 Chronic systolic (congestive) heart failure: Secondary | ICD-10-CM | POA: Diagnosis not present

## 2023-06-07 DIAGNOSIS — I10 Essential (primary) hypertension: Secondary | ICD-10-CM | POA: Diagnosis not present

## 2023-06-07 DIAGNOSIS — E782 Mixed hyperlipidemia: Secondary | ICD-10-CM | POA: Diagnosis not present

## 2023-06-07 DIAGNOSIS — Z8679 Personal history of other diseases of the circulatory system: Secondary | ICD-10-CM | POA: Diagnosis not present

## 2023-06-22 DIAGNOSIS — C9211 Chronic myeloid leukemia, BCR/ABL-positive, in remission: Secondary | ICD-10-CM | POA: Diagnosis not present

## 2023-06-22 DIAGNOSIS — C9210 Chronic myeloid leukemia, BCR/ABL-positive, not having achieved remission: Secondary | ICD-10-CM | POA: Diagnosis not present

## 2023-06-27 DIAGNOSIS — R69 Illness, unspecified: Secondary | ICD-10-CM | POA: Diagnosis not present

## 2023-07-21 DIAGNOSIS — Z7901 Long term (current) use of anticoagulants: Secondary | ICD-10-CM | POA: Diagnosis not present

## 2023-07-21 DIAGNOSIS — I110 Hypertensive heart disease with heart failure: Secondary | ICD-10-CM | POA: Diagnosis not present

## 2023-07-21 DIAGNOSIS — I4891 Unspecified atrial fibrillation: Secondary | ICD-10-CM | POA: Diagnosis not present

## 2023-07-21 DIAGNOSIS — Z880 Allergy status to penicillin: Secondary | ICD-10-CM | POA: Diagnosis not present

## 2023-07-21 DIAGNOSIS — Z8249 Family history of ischemic heart disease and other diseases of the circulatory system: Secondary | ICD-10-CM | POA: Diagnosis not present

## 2023-07-21 DIAGNOSIS — E039 Hypothyroidism, unspecified: Secondary | ICD-10-CM | POA: Diagnosis not present

## 2023-07-21 DIAGNOSIS — C9511 Chronic leukemia of unspecified cell type, in remission: Secondary | ICD-10-CM | POA: Diagnosis not present

## 2023-07-21 DIAGNOSIS — M545 Low back pain, unspecified: Secondary | ICD-10-CM | POA: Diagnosis not present

## 2023-07-21 DIAGNOSIS — E785 Hyperlipidemia, unspecified: Secondary | ICD-10-CM | POA: Diagnosis not present

## 2023-07-21 DIAGNOSIS — I509 Heart failure, unspecified: Secondary | ICD-10-CM | POA: Diagnosis not present

## 2023-07-21 DIAGNOSIS — R2681 Unsteadiness on feet: Secondary | ICD-10-CM | POA: Diagnosis not present

## 2023-07-21 DIAGNOSIS — I429 Cardiomyopathy, unspecified: Secondary | ICD-10-CM | POA: Diagnosis not present

## 2023-07-27 DIAGNOSIS — R69 Illness, unspecified: Secondary | ICD-10-CM | POA: Diagnosis not present

## 2023-07-29 DIAGNOSIS — L02212 Cutaneous abscess of back [any part, except buttock]: Secondary | ICD-10-CM | POA: Diagnosis not present

## 2023-08-23 ENCOUNTER — Other Ambulatory Visit (HOSPITAL_BASED_OUTPATIENT_CLINIC_OR_DEPARTMENT_OTHER): Payer: Self-pay

## 2023-08-23 MED ORDER — INFLUENZA VAC A&B SURF ANT ADJ 0.5 ML IM SUSY
0.5000 mL | PREFILLED_SYRINGE | Freq: Once | INTRAMUSCULAR | 0 refills | Status: AC
  Filled 2023-08-23: qty 0.5, 1d supply, fill #0

## 2023-08-27 DIAGNOSIS — R69 Illness, unspecified: Secondary | ICD-10-CM | POA: Diagnosis not present

## 2023-09-17 DIAGNOSIS — C9211 Chronic myeloid leukemia, BCR/ABL-positive, in remission: Secondary | ICD-10-CM | POA: Diagnosis not present

## 2023-09-17 DIAGNOSIS — C9210 Chronic myeloid leukemia, BCR/ABL-positive, not having achieved remission: Secondary | ICD-10-CM | POA: Diagnosis not present

## 2023-09-26 DIAGNOSIS — R69 Illness, unspecified: Secondary | ICD-10-CM | POA: Diagnosis not present

## 2023-10-11 DIAGNOSIS — I5022 Chronic systolic (congestive) heart failure: Secondary | ICD-10-CM | POA: Diagnosis not present

## 2023-10-11 DIAGNOSIS — Z8679 Personal history of other diseases of the circulatory system: Secondary | ICD-10-CM | POA: Diagnosis not present

## 2023-10-11 DIAGNOSIS — E782 Mixed hyperlipidemia: Secondary | ICD-10-CM | POA: Diagnosis not present

## 2023-10-11 DIAGNOSIS — I10 Essential (primary) hypertension: Secondary | ICD-10-CM | POA: Diagnosis not present

## 2024-01-13 ENCOUNTER — Telehealth (HOSPITAL_COMMUNITY): Payer: Self-pay | Admitting: *Deleted

## 2024-01-13 MED ORDER — METOPROLOL TARTRATE 25 MG PO TABS
12.5000 mg | ORAL_TABLET | Freq: Two times a day (BID) | ORAL | 3 refills | Status: DC
Start: 2024-01-13 — End: 2024-01-24

## 2024-01-13 NOTE — Telephone Encounter (Signed)
 Pt wife called in stating patient went back into afib earlier this week HRs currently 110-120s. BP is normal. Pt was feeling "funny" the last few days but they did not realize AF until last night. They decided to take 2 amiodarone 200mg  tablets from the past since he was on that before. Discussed with Jorja Loa PA will start metoprolol tartrate 12.5mg  BID and follow up early next week. Pt denies missed doses of Eliquis. Instructed to not restart amiodarone at this time.

## 2024-01-17 ENCOUNTER — Ambulatory Visit (HOSPITAL_COMMUNITY)
Admission: RE | Admit: 2024-01-17 | Discharge: 2024-01-17 | Disposition: A | Discharge status: Home / Self Care | Source: Ambulatory Visit | Attending: Physician Assistant | Admitting: Physician Assistant

## 2024-01-17 ENCOUNTER — Encounter (HOSPITAL_COMMUNITY): Payer: Self-pay | Admitting: Physician Assistant

## 2024-01-17 VITALS — BP 130/94 | HR 132 | Ht 73.5 in | Wt 237.8 lb

## 2024-01-17 DIAGNOSIS — I110 Hypertensive heart disease with heart failure: Secondary | ICD-10-CM | POA: Insufficient documentation

## 2024-01-17 DIAGNOSIS — Z79899 Other long term (current) drug therapy: Secondary | ICD-10-CM | POA: Insufficient documentation

## 2024-01-17 DIAGNOSIS — E039 Hypothyroidism, unspecified: Secondary | ICD-10-CM | POA: Insufficient documentation

## 2024-01-17 DIAGNOSIS — Z720 Tobacco use: Secondary | ICD-10-CM | POA: Diagnosis not present

## 2024-01-17 DIAGNOSIS — E785 Hyperlipidemia, unspecified: Secondary | ICD-10-CM | POA: Insufficient documentation

## 2024-01-17 DIAGNOSIS — C9210 Chronic myeloid leukemia, BCR/ABL-positive, not having achieved remission: Secondary | ICD-10-CM | POA: Insufficient documentation

## 2024-01-17 DIAGNOSIS — Z7901 Long term (current) use of anticoagulants: Secondary | ICD-10-CM | POA: Diagnosis not present

## 2024-01-17 DIAGNOSIS — D6869 Other thrombophilia: Secondary | ICD-10-CM | POA: Diagnosis not present

## 2024-01-17 DIAGNOSIS — I493 Ventricular premature depolarization: Secondary | ICD-10-CM | POA: Insufficient documentation

## 2024-01-17 DIAGNOSIS — I4819 Other persistent atrial fibrillation: Secondary | ICD-10-CM | POA: Insufficient documentation

## 2024-01-17 DIAGNOSIS — I5020 Unspecified systolic (congestive) heart failure: Secondary | ICD-10-CM | POA: Diagnosis not present

## 2024-01-17 LAB — CBC
HCT: 41.2 % (ref 39.0–52.0)
Hemoglobin: 13.4 g/dL (ref 13.0–17.0)
MCH: 29.6 pg (ref 26.0–34.0)
MCHC: 32.5 g/dL (ref 30.0–36.0)
MCV: 91.2 fL (ref 80.0–100.0)
Platelets: 219 10*3/uL (ref 150–400)
RBC: 4.52 MIL/uL (ref 4.22–5.81)
RDW: 13.8 % (ref 11.5–15.5)
WBC: 7.9 10*3/uL (ref 4.0–10.5)
nRBC: 0 % (ref 0.0–0.2)

## 2024-01-17 LAB — BASIC METABOLIC PANEL
Anion gap: 11 (ref 5–15)
BUN: 17 mg/dL (ref 8–23)
CO2: 22 mmol/L (ref 22–32)
Calcium: 9.1 mg/dL (ref 8.9–10.3)
Chloride: 100 mmol/L (ref 98–111)
Creatinine, Ser: 0.98 mg/dL (ref 0.61–1.24)
GFR, Estimated: >60 mL/min (ref >60)
Glucose, Bld: 102 mg/dL — ABNORMAL HIGH (ref 70–99)
Potassium: 3.9 mmol/L (ref 3.5–5.1)
Sodium: 133 mmol/L — ABNORMAL LOW (ref 135–145)

## 2024-01-17 NOTE — Patient Instructions (Signed)
 Cardioversion scheduled for: Tuesday March 25   - Arrive at the [facility] and go to admitting at 10:30am   - Do not eat or drink anything after midnight the night prior to your procedure.   - Take all your morning medication (except diabetic medications) with a sip of water prior to arrival.  - You will not be able to drive home after your procedure.    - Do NOT miss any doses of your blood thinner - if you should miss a dose please notify our office immediately.   - If you feel as if you go back into normal rhythm prior to scheduled cardioversion, please notify our office immediately.   If your procedure is canceled in the cardioversion suite you will be charged a cancellation fee.

## 2024-01-17 NOTE — Progress Notes (Signed)
 Spoke to pt and instructed them to come at 1030 and to be NPO after 0000.  Confirmed no missed doses of AC and instructed to take in AM with a small sip of water.  Confirmed that pt will have a ride home and someone to stay with them for 24 hours after the procedure. Instructed patient to not wear any jewelry or lotion.

## 2024-01-17 NOTE — H&P (View-Only) (Signed)
 Primary Care Physician: Henrine Screws, MD Primary Cardiologist: Dr Sharyn Lull  Primary Electrophysiologist: [name] patient  Referring Physician: Dr Aloha [name] is a 83 y.o. male with a history of HTN, systolic dysfunction, hypothyroidism, CML, HLD, tobacco abuse, and atrial fibrillation who presents for follow up in the [facility].  The patient was initially diagnosed with atrial fibrillation in 2015. He underwent DCCV at that time and has been maintained on amiodarone. Patient is on Eliquis for stroke prevention. He was in his usual state of health until the beginning of May 2022 when he was noted to be back in afib with rapid rates. His amiodarone was increased and he was started on metoprolol.  Patient is s/p afib ablation 05/29/21 with Dr Johney Frame. Amiodarone and metoprolol were discontinued.   Patient returns for follow up for atrial fibrillation. Patient called the clinic 01/13/24 reporting that he had gone back into afib with symptoms of fatigue on exertion. He checked his heart rate which was elevated in the 120s bpm. He tried taking a couple doses of amiodarone which he was on prior to his ablation. He was started on metoprolol for rate control. There were no specific triggers that he could identify.   Today, he denies symptoms of palpitations, chest pain, shortness of breath, orthopnea, PND, lower extremity edema, dizziness, presyncope, syncope, snoring, daytime somnolence, bleeding, or neurologic sequela. The patient is tolerating medications without difficulties and is otherwise without complaint today.    Atrial Fibrillation Risk Factors:  he does not have symptoms or diagnosis of sleep apnea. he does not have a history of rheumatic fever. he does not have a history of alcohol use.   Atrial Fibrillation Management history:  Previous antiarrhythmic drugs: amiodarone  Previous cardioversions: 2015 Previous ablations:  05/29/21 Anticoagulation history: Eliquis   Past Medical History:  Diagnosis Date   Arthritis    CML (chronic myelocytic leukemia) (HCC)    in remission   History of kidney stones    Hypertension    Persistent atrial fibrillation (HCC)    Atrial Fibrillation    Current Outpatient Medications  Medication Sig Dispense Refill   acetaminophen (TYLENOL) 650 MG CR tablet Take 650 mg by mouth every 8 (eight) hours as needed for pain.     apixaban (ELIQUIS) 5 MG TABS tablet Take 1 tablet (5 mg total) by mouth every 12 (twelve) hours. 60 tablet 3   clobetasol cream (TEMOVATE) 0.05 % Apply 1 application topically 2 (two) times daily as needed (rash).     cyanocobalamin 1000 MCG tablet Take 1,000 mcg by mouth daily.     dasatinib (SPRYCEL) 20 MG tablet Take 2 tablets (40 mg total) by mouth daily. 60 tablet 11   hydrocortisone cream 1 % Apply 1 application topically 2 (two) times daily as needed for itching.     influenza vaccine adjuvanted (FLUAD QUADRIVALENT) 0.5 ML injection Inject into the muscle. 0.5 mL 0   influenza vaccine adjuvanted (FLUAD) 0.5 ML injection Inject into the muscle. 0.5 mL 0   levothyroxine (SYNTHROID) 25 MCG tablet Take 25 mcg by mouth daily before breakfast.     metoprolol tartrate (LOPRESSOR) 25 MG tablet Take 0.5 tablets (12.5 mg total) by mouth 2 (two) times daily. 30 tablet 3   Polyethyl Glycol-Propyl Glycol (SYSTANE ULTRA) 0.4-0.3 % SOLN Place 1-2 drops into both eyes 3 (three) times daily as needed (for dry eyes).     rosuvastatin (CRESTOR) 5 MG tablet Take 5  mg by mouth at bedtime.     RSV vaccine recomb adjuvanted (AREXVY) 120 MCG/0.5ML injection Inject into the muscle. 0.5 mL 0   sacubitril-valsartan (ENTRESTO) 49-51 MG Take 1.5 tablets by mouth 2 (two) times daily.     torsemide (DEMADEX) 10 MG tablet Take 10-20 mg by mouth daily as needed (fluid).     No current facility-administered medications for this encounter.    ROS- All systems are reviewed and  negative except as per the HPI above.  Physical Exam: Vitals:   01/17/24 0833  BP: (!) 130/94  Pulse: (!) 132  Weight: 107.9 kg  Height: 6' 1.5" (1.867 m)     GEN: Well nourished, well developed in no acute distress CARDIAC: Irregularly irregular rate and rhythm, no murmurs, rubs, gallops RESPIRATORY:  Clear to auscultation without rales, wheezing or rhonchi  ABDOMEN: Soft, non-tender, non-distended EXTREMITIES:  No edema; No deformity    Wt Readings from Last 3 Encounters:  01/17/24 107.9 kg  02/03/23 104.3 kg  09/01/21 105.7 kg    EKG today demonstrates  Afib with RVR Vent. rate 132 BPM PR interval * ms QRS duration 96 ms QT/QTcB 312/462 ms   Echo 09/26/19 demonstrated   1. Left ventricular ejection fraction, by visual estimation, is 60 to  65%. There is mildly increased left ventricular hypertrophy.   2. Global right ventricle has normal systolic function.The right  ventricular size is normal. No increase in right ventricular wall  thickness.   3. Left atrial size was mildly dilated.   4. Right atrial size was mildly dilated.   5. Trivial pericardial effusion is present.   6. The mitral valve is normal in structure. Mild mitral valve  regurgitation. No evidence of mitral stenosis.   7. The tricuspid valve is normal in structure. Tricuspid valve  regurgitation is mild.   8. The aortic valve is normal in structure.   9. Mildly elevated pulmonary artery systolic pressure.  10. The inferior vena cava is dilated in size with <50% respiratory  variability, suggesting right atrial pressure of 15 mmHg.   Epic records are reviewed at length today  CHA2DS2-VASc Score = 3  The patient's score is based upon: CHF History: 0 (EF normalized) HTN History: 1 Diabetes History: 0 Stroke History: 0 Vascular Disease History: 0 Age Score: 2 Gender Score: 0       ASSESSMENT AND PLAN: Persistent Atrial Fibrillation (ICD10:  I48.19) The patient's CHA2DS2-VASc score is 3,  indicating a 3.2% annual risk of stroke.   S/p afib ablation 05/29/21 with Dr Johney Frame, off amiodarone post ablation Patient back in persistent afib. We discussed rhythm control options today. Will arrange for DCCV. Check bmet/cbc today.  Continue Eliquis 5 mg BID, patient denies any missed doses in the past 3 weeks.  Continue Lopressor 12.5 mg BID  Secondary Hypercoagulable State (ICD10:  D68.69) The patient is at significant risk for stroke/thromboembolism based upon his CHA2DS2-VASc Score of 3.  Continue Apixaban (Eliquis). No bleeding issues on anticoagulation.   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    Informed Consent   Shared Decision Making/Informed Consent The risks (stroke, cardiac arrhythmias rarely resulting in the need for a temporary or permanent pacemaker, skin irritation or burns and complications associated with conscious sedation including aspiration, arrhythmia, respiratory failure and death), benefits (restoration of normal sinus rhythm) and alternatives of a direct current  cardioversion were explained in detail to Mr. Lance Hubbard and he agrees to proceed.       Jorja Loa PA-C [facility] [address] [place], [place] [address] [phone] 01/17/2024 8:53 AM

## 2024-01-17 NOTE — Addendum Note (Signed)
 Encounter addended by: Shona Simpson, RN on: 01/17/2024 9:33 AM  Actions taken: New alternative orders accepted, Order list changed, Diagnosis association updated

## 2024-01-17 NOTE — Progress Notes (Signed)
 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 male with a history of HTN, systolic dysfunction, hypothyroidism, CML, HLD, tobacco abuse, and atrial fibrillation who presents for follow up in the [facility].  The patient was initially diagnosed with atrial fibrillation in 2015. He underwent DCCV at that time and has been maintained on amiodarone. Patient is on Eliquis for stroke prevention. He was in his usual state of health until the beginning of May 2022 when he was noted to be back in afib with rapid rates. His amiodarone was increased and he was started on metoprolol.  Patient is s/p afib ablation 05/29/21 with Lance Hubbard. Amiodarone and metoprolol were discontinued.   Patient returns for follow up for atrial fibrillation. Patient called the clinic 01/13/24 reporting that he had gone back into afib with symptoms of fatigue on exertion. He checked his heart rate which was elevated in the 120s bpm. He tried taking a couple doses of amiodarone which he was on prior to his ablation. He was started on metoprolol for rate control. There were no specific triggers that he could identify.   Today, he denies symptoms of palpitations, chest pain, shortness of breath, orthopnea, PND, lower extremity edema, dizziness, presyncope, syncope, snoring, daytime somnolence, bleeding, or neurologic sequela. The patient is tolerating medications without difficulties and is otherwise without complaint today.    Atrial Fibrillation Risk Factors:  he does not have symptoms or diagnosis of sleep apnea. he does not have a history of rheumatic fever. he does not have a history of alcohol use.   Atrial Fibrillation Management history:  Previous antiarrhythmic drugs: amiodarone  Previous cardioversions: 2015 Previous ablations:  05/29/21 Anticoagulation history: Eliquis   Past Medical History:  Diagnosis Date   Arthritis    CML (chronic myelocytic leukemia) (HCC)    in remission   History of kidney stones    Hypertension    Persistent atrial fibrillation (HCC)    Atrial Fibrillation    Current Outpatient Medications  Medication Sig Dispense Refill   acetaminophen (TYLENOL) 650 MG CR tablet Take 650 mg by mouth every 8 (eight) hours as needed for pain.     apixaban (ELIQUIS) 5 MG TABS tablet Take 1 tablet (5 mg total) by mouth every 12 (twelve) hours. 60 tablet 3   clobetasol cream (TEMOVATE) 0.05 % Apply 1 application topically 2 (two) times daily as needed (rash).     cyanocobalamin 1000 MCG tablet Take 1,000 mcg by mouth daily.     dasatinib (SPRYCEL) 20 MG tablet Take 2 tablets (40 mg total) by mouth daily. 60 tablet 11   hydrocortisone cream 1 % Apply 1 application topically 2 (two) times daily as needed for itching.     influenza vaccine adjuvanted (FLUAD QUADRIVALENT) 0.5 ML injection Inject into the muscle. 0.5 mL 0   influenza vaccine adjuvanted (FLUAD) 0.5 ML injection Inject into the muscle. 0.5 mL 0   levothyroxine (SYNTHROID) 25 MCG tablet Take 25 mcg by mouth daily before breakfast.     metoprolol tartrate (LOPRESSOR) 25 MG tablet Take 0.5 tablets (12.5 mg total) by mouth 2 (two) times daily. 30 tablet 3   Polyethyl Glycol-Propyl Glycol (SYSTANE ULTRA) 0.4-0.3 % SOLN Place 1-2 drops into both eyes 3 (three) times daily as needed (for dry eyes).     rosuvastatin (CRESTOR) 5 MG tablet Take 5  mg by mouth at bedtime.     RSV vaccine recomb adjuvanted (AREXVY) 120 MCG/0.5ML injection Inject into the muscle. 0.5 mL 0   sacubitril-valsartan (ENTRESTO) 49-51 MG Take 1.5 tablets by mouth 2 (two) times daily.     torsemide (DEMADEX) 10 MG tablet Take 10-20 mg by mouth daily as needed (fluid).     No current facility-administered medications for this encounter.    ROS- All systems are reviewed and  negative except as per the HPI above.  Physical Exam: Vitals:   01/17/24 0833  BP: (!) 130/94  Pulse: (!) 132  Weight: 107.9 kg  Height: 6' 1.5" (1.867 m)     GEN: Well nourished, well developed in no acute distress CARDIAC: Irregularly irregular rate and rhythm, no murmurs, rubs, gallops RESPIRATORY:  Clear to auscultation without rales, wheezing or rhonchi  ABDOMEN: Soft, non-tender, non-distended EXTREMITIES:  No edema; No deformity    Wt Readings from Last 3 Encounters:  01/17/24 107.9 kg  02/03/23 104.3 kg  09/01/21 105.7 kg    EKG today demonstrates  Afib with RVR Vent. rate 132 BPM PR interval * ms QRS duration 96 ms QT/QTcB 312/462 ms   Echo 09/26/19 demonstrated   1. Left ventricular ejection fraction, by visual estimation, is 60 to  65%. There is mildly increased left ventricular hypertrophy.   2. Global right ventricle has normal systolic function.The right  ventricular size is normal. No increase in right ventricular wall  thickness.   3. Left atrial size was mildly dilated.   4. Right atrial size was mildly dilated.   5. Trivial pericardial effusion is present.   6. The mitral valve is normal in structure. Mild mitral valve  regurgitation. No evidence of mitral stenosis.   7. The tricuspid valve is normal in structure. Tricuspid valve  regurgitation is mild.   8. The aortic valve is normal in structure.   9. Mildly elevated pulmonary artery systolic pressure.  10. The inferior vena cava is dilated in size with <50% respiratory  variability, suggesting right atrial pressure of 15 mmHg.   Epic records are reviewed at length today  CHA2DS2-VASc Score = 3  The patient's score is based upon: CHF History: 0 (EF normalized) HTN History: 1 Diabetes History: 0 Stroke History: 0 Vascular Disease History: 0 Age Score: 2 Gender Score: 0       ASSESSMENT AND PLAN: Persistent Atrial Fibrillation (ICD10:  I48.19) The patient's CHA2DS2-VASc score is 3,  indicating a 3.2% annual risk of stroke.   S/p afib ablation 05/29/21 with Lance Hubbard, off amiodarone post ablation Patient back in persistent afib. We discussed rhythm control options today. Will arrange for DCCV. Check bmet/cbc today.  Continue Eliquis 5 mg BID, patient denies any missed doses in the past 3 weeks.  Continue Lopressor 12.5 mg BID  Secondary Hypercoagulable State (ICD10:  D68.69) The patient is at significant risk for stroke/thromboembolism based upon his CHA2DS2-VASc Score of 3.  Continue Apixaban (Eliquis). No bleeding issues on anticoagulation.   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    Informed Consent   Shared Decision Making/Informed Consent The risks (stroke, cardiac arrhythmias rarely resulting in the need for a temporary or permanent pacemaker, skin irritation or burns and complications associated with conscious sedation including aspiration, arrhythmia, respiratory failure and death), benefits (restoration of normal sinus rhythm) and alternatives of a direct current  cardioversion were explained in detail to Mr. Lance Hubbard and he agrees to proceed.       Jorja Loa PA-C Afib Clinic [facility] [address] [place], [place] [address] [phone] 01/17/2024 8:53 AM

## 2024-01-17 NOTE — Addendum Note (Signed)
 Encounter addended by: Graylin Shiver, CMA on: 01/17/2024 8:59 AM  Actions taken: Clinical Note Signed

## 2024-01-18 ENCOUNTER — Ambulatory Visit (HOSPITAL_COMMUNITY): Payer: Self-pay

## 2024-01-18 ENCOUNTER — Encounter (HOSPITAL_COMMUNITY)
Admission: RE | Disposition: A | Discharge status: Home / Self Care | Payer: Self-pay | Source: Ambulatory Visit | Attending: Cardiology

## 2024-01-18 ENCOUNTER — Ambulatory Visit (HOSPITAL_BASED_OUTPATIENT_CLINIC_OR_DEPARTMENT_OTHER): Payer: Self-pay

## 2024-01-18 ENCOUNTER — Ambulatory Visit (HOSPITAL_COMMUNITY)
Admission: RE | Admit: 2024-01-18 | Discharge: 2024-01-18 | Disposition: A | Discharge status: Home / Self Care | Source: Ambulatory Visit | Attending: Cardiology | Admitting: Cardiology

## 2024-01-18 ENCOUNTER — Other Ambulatory Visit: Payer: Self-pay

## 2024-01-18 DIAGNOSIS — Z7901 Long term (current) use of anticoagulants: Secondary | ICD-10-CM | POA: Diagnosis not present

## 2024-01-18 DIAGNOSIS — Z87891 Personal history of nicotine dependence: Secondary | ICD-10-CM | POA: Diagnosis not present

## 2024-01-18 DIAGNOSIS — I110 Hypertensive heart disease with heart failure: Secondary | ICD-10-CM | POA: Insufficient documentation

## 2024-01-18 DIAGNOSIS — I509 Heart failure, unspecified: Secondary | ICD-10-CM | POA: Diagnosis not present

## 2024-01-18 DIAGNOSIS — I4891 Unspecified atrial fibrillation: Secondary | ICD-10-CM | POA: Diagnosis not present

## 2024-01-18 DIAGNOSIS — D6869 Other thrombophilia: Secondary | ICD-10-CM | POA: Diagnosis not present

## 2024-01-18 DIAGNOSIS — Z79899 Other long term (current) drug therapy: Secondary | ICD-10-CM | POA: Diagnosis not present

## 2024-01-18 DIAGNOSIS — I10 Essential (primary) hypertension: Secondary | ICD-10-CM | POA: Diagnosis not present

## 2024-01-18 DIAGNOSIS — I4819 Other persistent atrial fibrillation: Secondary | ICD-10-CM

## 2024-01-18 DIAGNOSIS — M1990 Unspecified osteoarthritis, unspecified site: Secondary | ICD-10-CM | POA: Insufficient documentation

## 2024-01-18 HISTORY — PX: CARDIOVERSION: EP1203

## 2024-01-18 SURGERY — CARDIOVERSION (CATH LAB)
Anesthesia: General

## 2024-01-18 MED ORDER — SODIUM CHLORIDE 0.9% FLUSH: 3.0000 mL | Freq: Two times a day (BID) | INTRAVENOUS | Status: DC

## 2024-01-18 MED ORDER — LIDOCAINE 2% (20 MG/ML) 5 ML SYRINGE
INTRAMUSCULAR | Status: DC | PRN
  Administered 2024-01-18: 40 mg via INTRAVENOUS

## 2024-01-18 MED ORDER — SODIUM CHLORIDE 0.9% FLUSH: 3.0000 mL | INTRAVENOUS | Status: DC | PRN

## 2024-01-18 MED ORDER — PROPOFOL 10 MG/ML IV BOLUS
INTRAVENOUS | Status: DC | PRN
  Administered 2024-01-18: 60 mg via INTRAVENOUS

## 2024-01-18 SURGICAL SUPPLY — 1 items: PAD DEFIB RADIO PHYSIO CONN (PAD) ×1 IMPLANT

## 2024-01-18 NOTE — Transfer of Care (Signed)
 Immediate Anesthesia Transfer of Care Note  Patient: Lance Hubbard  Procedure(s) Performed: CARDIOVERSION  Patient Location: PACU and Cath Lab  Anesthesia Type:MAC  Level of Consciousness: awake  Airway & Oxygen Therapy: Patient Spontanous Breathing and Patient connected to face mask oxygen  Post-op Assessment: Report given to RN and Post -op Vital signs reviewed and stable  Post vital signs: Reviewed and stable  Last Vitals:  Vitals Value Taken Time  BP 97/68 01/18/24 1150  Temp    Pulse 58 01/18/24 1150  Resp 21 01/18/24 1150  SpO2 99% 01/18/24 1150    Last Pain:  Vitals:   01/18/24 1035  TempSrc: Temporal         Complications: No notable events documented.

## 2024-01-18 NOTE — CV Procedure (Signed)
    Electrical Cardioversion Procedure Note Lance Hubbard [id number] 1941-03-15  Procedure: Electrical Cardioversion Indications:  Atrial Fibrillation  Time Out: Verified patient identification, verified procedure,medications/allergies/relevent history reviewed, required imaging and test results available.  Performed  Procedure Details  The patient was NPO after midnight. Anesthesia was administered at the beside  by Dr.Hollis with propofol.  Cardioversion was performed with synchronized biphasic defibrillation via AP pads with 200 joules.  1 attempt(s) were performed.  The patient converted to normal sinus rhythm. The patient tolerated the procedure well   IMPRESSION:  Successful cardioversion of atrial fibrillation    Lance Hubbard 01/18/2024, 11:49 AM

## 2024-01-18 NOTE — Anesthesia Preprocedure Evaluation (Addendum)
 Anesthesia Evaluation  Patient identified by MRN, date of birth, ID band Patient awake    Reviewed: Allergy & Precautions, NPO status , Patient's Chart, lab work & pertinent test results, reviewed documented beta blocker date and time   Airway Mallampati: II  TM Distance: >3 FB Neck ROM: Full    Dental  (+) Teeth Intact, Dental Advisory Given   Pulmonary former smoker   breath sounds clear to auscultation       Cardiovascular hypertension, Pt. on home beta blockers and Pt. on medications + dysrhythmias Atrial Fibrillation  Rhythm:Irregular Rate:Abnormal     Neuro/Psych    GI/Hepatic negative GI ROS, Neg liver ROS,,,  Endo/Other  negative endocrine ROS    Renal/GU negative Renal ROS     Musculoskeletal  (+) Arthritis ,    Abdominal   Peds  Hematology  (+) Blood dyscrasia, anemia   Anesthesia Other Findings   Reproductive/Obstetrics                             Anesthesia Physical Anesthesia Plan  ASA: 3  Anesthesia Plan: General   Post-op Pain Management: Minimal or no pain anticipated   Induction: Intravenous  PONV Risk Score and Plan: 0 and Propofol infusion  Airway Management Planned: Natural Airway  Additional Equipment: None  Intra-op Plan:   Post-operative Plan:   Informed Consent: I have reviewed the patients History and Physical, chart, labs and discussed the procedure including the risks, benefits and alternatives for the proposed anesthesia with the patient or authorized representative who has indicated his/her understanding and acceptance.       Plan Discussed with: CRNA  Anesthesia Plan Comments:        Anesthesia Quick Evaluation

## 2024-01-18 NOTE — Interval H&P Note (Signed)
 History and Physical Interval Note:  01/18/2024 11:36 AM  Lance Hubbard  has presented today for surgery, with the diagnosis of AFIB.  The various methods of treatment have been discussed with the patient and family. After consideration of risks, benefits and other options for treatment, the patient has consented to  Procedure(s): CARDIOVERSION (N/A) as a surgical intervention.  The patient's history has been reviewed, patient examined, no change in status, stable for surgery.  I have reviewed the patient's chart and labs.  Questions were answered to the patient's satisfaction.     [employer]

## 2024-01-18 NOTE — Anesthesia Postprocedure Evaluation (Signed)
 Anesthesia Post Note  Patient: Lance Hubbard  Procedure(s) Performed: CARDIOVERSION     Patient location during evaluation: PACU Anesthesia Type: General Level of consciousness: awake and alert Pain management: pain level controlled Vital Signs Assessment: post-procedure vital signs reviewed and stable Respiratory status: spontaneous breathing, nonlabored ventilation, respiratory function stable and patient connected to nasal cannula oxygen Cardiovascular status: blood pressure returned to baseline and stable Postop Assessment: no apparent nausea or vomiting Anesthetic complications: no  No notable events documented.  Last Vitals:  Vitals:   01/18/24 1200 01/18/24 1203  BP: (!) 88/59 (!) 91/59  Pulse: 61 (!) 58  Resp: (!) 23 (!) 23  Temp:    SpO2: 96% 95%    Last Pain:  Vitals:   01/18/24 1157  TempSrc:   PainSc: 0-No pain                 Shelton Silvas

## 2024-01-18 NOTE — Discharge Instructions (Signed)

## 2024-01-19 ENCOUNTER — Encounter (HOSPITAL_COMMUNITY): Payer: Self-pay | Admitting: Cardiology

## 2024-01-24 ENCOUNTER — Ambulatory Visit (HOSPITAL_COMMUNITY)
Admission: RE | Admit: 2024-01-24 | Discharge: 2024-01-24 | Disposition: A | Discharge status: Home / Self Care | Source: Ambulatory Visit | Attending: Physician Assistant | Admitting: Physician Assistant

## 2024-01-24 ENCOUNTER — Encounter (HOSPITAL_COMMUNITY): Payer: Self-pay | Admitting: Physician Assistant

## 2024-01-24 VITALS — BP 142/72 | HR 64 | Ht 74.0 in | Wt 236.4 lb

## 2024-01-24 DIAGNOSIS — I4819 Other persistent atrial fibrillation: Secondary | ICD-10-CM | POA: Insufficient documentation

## 2024-01-24 DIAGNOSIS — Z7901 Long term (current) use of anticoagulants: Secondary | ICD-10-CM | POA: Diagnosis not present

## 2024-01-24 DIAGNOSIS — E039 Hypothyroidism, unspecified: Secondary | ICD-10-CM | POA: Diagnosis not present

## 2024-01-24 DIAGNOSIS — Z79899 Other long term (current) drug therapy: Secondary | ICD-10-CM | POA: Diagnosis not present

## 2024-01-24 DIAGNOSIS — D6869 Other thrombophilia: Secondary | ICD-10-CM | POA: Diagnosis not present

## 2024-01-24 DIAGNOSIS — Z87891 Personal history of nicotine dependence: Secondary | ICD-10-CM | POA: Insufficient documentation

## 2024-01-24 DIAGNOSIS — I110 Hypertensive heart disease with heart failure: Secondary | ICD-10-CM | POA: Insufficient documentation

## 2024-01-24 DIAGNOSIS — I5030 Unspecified diastolic (congestive) heart failure: Secondary | ICD-10-CM | POA: Insufficient documentation

## 2024-01-24 DIAGNOSIS — Z856 Personal history of leukemia: Secondary | ICD-10-CM | POA: Insufficient documentation

## 2024-01-24 MED ORDER — METOPROLOL TARTRATE 25 MG PO TABS: 12.5000 mg | ORAL_TABLET | Freq: Two times a day (BID) | ORAL | Status: DC | PRN

## 2024-01-24 NOTE — Progress Notes (Signed)
 Primary Care Physician: Henrine Screws, MD Primary Cardiologist: Dr Sharyn Lull  Primary Electrophysiologist: [name] patient  Referring Physician: Dr Aloha [name] is a 83 y.o. male with a history of HTN, systolic dysfunction, hypothyroidism, CML, HLD, tobacco abuse, and atrial fibrillation who presents for follow up in the [facility] Atrial Fibrillation Clinic.  The patient was initially diagnosed with atrial fibrillation in 2015. He underwent DCCV at that time and has been maintained on amiodarone. Patient is on Eliquis for stroke prevention. He was in his usual state of health until the beginning of May 2022 when he was noted to be back in afib with rapid rates. His amiodarone was increased and he was started on metoprolol.  Patient is s/p afib ablation 05/29/21 with Dr Johney Frame. Amiodarone and metoprolol were discontinued. Patient called the clinic 01/13/24 reporting that he had gone back into afib with symptoms of fatigue on exertion. He checked his heart rate which was elevated in the 120s bpm. He tried taking a couple doses of amiodarone which he was on prior to his ablation. He was started on metoprolol for rate control and underwent DCCV on 01/18/24.  Patient returns for follow up for atrial fibrillation. He remains in SR. His symptoms of fatigue have improved. No bleeding issues on anticoagulation.   Today, he  denies symptoms of palpitations, chest pain, shortness of breath, orthopnea, PND, lower extremity edema, dizziness, presyncope, syncope, snoring, daytime somnolence, bleeding, or neurologic sequela. The patient is tolerating medications without difficulties and is otherwise without complaint today.    Atrial Fibrillation Risk Factors:  he does not have symptoms or diagnosis of sleep apnea. he does not have a history of rheumatic fever. he does not have a history of alcohol use.   Atrial Fibrillation Management history:  Previous antiarrhythmic drugs:  amiodarone  Previous cardioversions: 2015, 01/18/24 Previous ablations: 05/29/21 Anticoagulation history: Eliquis   Past Medical History:  Diagnosis Date   Arthritis    CML (chronic myelocytic leukemia) (HCC)    in remission   History of kidney stones    Hypertension    Persistent atrial fibrillation (HCC)    Atrial Fibrillation    Current Outpatient Medications  Medication Sig Dispense Refill   acetaminophen (TYLENOL) 500 MG tablet Take 1,000 mg by mouth every 6 (six) hours as needed for mild pain (pain score 1-3) or moderate pain (pain score 4-6).     apixaban (ELIQUIS) 5 MG TABS tablet Take 1 tablet (5 mg total) by mouth every 12 (twelve) hours. 60 tablet 3   clobetasol cream (TEMOVATE) 0.05 % Apply 1 application topically 2 (two) times daily as needed (rash).     cyanocobalamin 2000 MCG tablet Take 2,000 mcg by mouth daily.     dasatinib (SPRYCEL) 20 MG tablet Take 2 tablets (40 mg total) by mouth daily. 60 tablet 11   hydrocortisone cream 1 % Apply 1 application topically 2 (two) times daily as needed for itching.     influenza vaccine adjuvanted (FLUAD QUADRIVALENT) 0.5 ML injection Inject into the muscle. 0.5 mL 0   influenza vaccine adjuvanted (FLUAD) 0.5 ML injection Inject into the muscle. 0.5 mL 0   levothyroxine (SYNTHROID) 25 MCG tablet Take 25 mcg by mouth daily before breakfast.     metoprolol tartrate (LOPRESSOR) 25 MG tablet Take 0.5 tablets (12.5 mg total) by mouth 2 (two) times daily. 30 tablet 3   Polyethyl Glycol-Propyl Glycol (SYSTANE ULTRA) 0.4-0.3 % SOLN Place 1-2 drops into both  eyes 3 (three) times daily as needed (for dry eyes).     rosuvastatin (CRESTOR) 5 MG tablet Take 5 mg by mouth at bedtime.     RSV vaccine recomb adjuvanted (AREXVY) 120 MCG/0.5ML injection Inject into the muscle. 0.5 mL 0   sacubitril-valsartan (ENTRESTO) 49-51 MG Take 0.5 tablets by mouth 2 (two) times daily.     torsemide (DEMADEX) 10 MG tablet Take 10 mg by mouth daily.     No  current facility-administered medications for this encounter.    ROS- All systems are reviewed and negative except as per the HPI above.  Physical Exam: Vitals:   01/24/24 0857  BP: (!) 142/72  Pulse: 64  Weight: 107.2 kg  Height: 6\' 2"  (1.88 m)    GEN: Well nourished, well developed in no acute distress CARDIAC: Regular rate and rhythm, no murmurs, rubs, gallops RESPIRATORY:  Clear to auscultation without rales, wheezing or rhonchi  ABDOMEN: Soft, non-tender, non-distended EXTREMITIES:  No edema; No deformity    Wt Readings from Last 3 Encounters:  01/24/24 107.2 kg  01/18/24 106.6 kg  01/17/24 107.9 kg    EKG today demonstrates  SR, PAC Vent. rate 64 BPM PR interval 194 ms QRS duration 94 ms QT/QTcB 422/435 ms   Echo 09/26/19 demonstrated   1. Left ventricular ejection fraction, by visual estimation, is 60 to  65%. There is mildly increased left ventricular hypertrophy.   2. Global right ventricle has normal systolic function.The right  ventricular size is normal. No increase in right ventricular wall  thickness.   3. Left atrial size was mildly dilated.   4. Right atrial size was mildly dilated.   5. Trivial pericardial effusion is present.   6. The mitral valve is normal in structure. Mild mitral valve  regurgitation. No evidence of mitral stenosis.   7. The tricuspid valve is normal in structure. Tricuspid valve  regurgitation is mild.   8. The aortic valve is normal in structure.   9. Mildly elevated pulmonary artery systolic pressure.  10. The inferior vena cava is dilated in size with <50% respiratory  variability, suggesting right atrial pressure of 15 mmHg.   Epic records are reviewed at length today  CHA2DS2-VASc Score = 3  The patient's score is based upon: CHF History: 0 (EF normalized) HTN History: 1 Diabetes History: 0 Stroke History: 0 Vascular Disease History: 0 Age Score: 2 Gender Score: 0       ASSESSMENT AND PLAN: Persistent  Atrial Fibrillation (ICD10:  I48.19) The patient's CHA2DS2-VASc score is 3, indicating a 3.2% annual risk of stroke.   S/p afib ablation 05/29/21 with Dr Johney Frame, off amiodarone post ablation.  S/p DCCV 01/18/24 Patient appears to be maintaining SR Continue Eliquis 5 mg BID Decrease Lopressor to 12.5 mg BID PRN for heart racing.   Secondary Hypercoagulable State (ICD10:  D68.69) The patient is at significant risk for stroke/thromboembolism based upon his CHA2DS2-VASc Score of 3.  Continue Apixaban (Eliquis). No bleeding issues.  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     Jorja Loa PA-C Afib Clinic [facility] [address] [place], [place] [address] [phone] 01/24/2024 9:04 AM

## 2024-01-24 NOTE — Addendum Note (Signed)
 Encounter addended by: Shona Simpson, RN on: 01/24/2024 10:20 AM  Actions taken: Diagnosis association updated, Order list changed

## 2024-04-09 ENCOUNTER — Other Ambulatory Visit (HOSPITAL_COMMUNITY): Payer: Self-pay | Admitting: Physician Assistant

## 2024-07-10 NOTE — Progress Notes (Unsigned)
  Electrophysiology Office Note:   Date:  07/11/2024  ID:  Lance Hubbard, DOB Jul 13, 1941, MRN [id number]  Primary Cardiologist: None Primary Heart Failure: None Electrophysiologist: Trysten Berti Gladis Norton, MD      History of Present Illness:   Lance Hubbard is a 83 y.o. male with h/o hypertension, hypothyroidism, CML, hyperlipidemia, tobacco abuse, atrial fibrillation seen today for routine electrophysiology followup.   He was diagnosed with atrial fibrillation in 2015.  He is post ablation 05/29/2021.  He was initially on amiodarone  metoprolol  but these have been stopped.  He went back into atrial fibrillation 01/13/2024 with symptoms of fatigue.  He had cardioversion 01/18/2024.  Since last being seen in our clinic the patient reports doing well.  He has had no further episodes of atrial fibrillation since his cardioversion.  He was in a whirlpool when he went into atrial fibrillation and thinks that this may be a trigger.  He has peripheral neuropathy and has been told that walking in a pool may help.  He also feels that the whirlpool may give him some benefit.  he denies chest pain, palpitations, dyspnea, PND, orthopnea, nausea, vomiting, dizziness, syncope, edema, weight gain, or early satiety.   Review of systems complete and found to be negative unless listed in HPI.   EP Information / Studies Reviewed:    EKG is ordered today. Personal review as below.  EKG Interpretation Date/Time:  Tuesday July 11 2024 10:29:32 EDT Ventricular Rate:  65 PR Interval:  168 QRS Duration:  100 QT Interval:  408 QTC Calculation: 424 R Axis:   91  Text Interpretation: Sinus rhythm with Premature supraventricular complexes Rightward axis When compared with ECG of 24-Jan-2024 08:59, No significant change was found Confirmed by Babacar Haycraft ([id number]) on 07/11/2024 10:31:12 AM   Risk Assessment/Calculations:    CHA2DS2-VASc Score = 3   This indicates a 3.2% annual risk of stroke. The patient's score is  based upon: CHF History: 0 (EF normalized) HTN History: 1 Diabetes History: 0 Stroke History: 0 Vascular Disease History: 0 Age Score: 2 Gender Score: 0            Physical Exam:   VS:  BP 130/82 (BP Location: Right Arm, Patient Position: Sitting, Cuff Size: Large)   Pulse 65   Ht 6' 2 (1.88 m)   Wt 215 lb (97.5 kg)   SpO2 98%   BMI 27.60 kg/m    Wt Readings from Last 3 Encounters:  07/11/24 215 lb (97.5 kg)  01/24/24 236 lb 6.4 oz (107.2 kg)  01/18/24 235 lb (106.6 kg)     GEN: Well nourished, well developed in no acute distress NECK: No JVD; No carotid bruits CARDIAC: Regular rate and rhythm, no murmurs, rubs, gallops RESPIRATORY:  Clear to auscultation without rales, wheezing or rhonchi  ABDOMEN: Soft, non-tender, non-distended EXTREMITIES:  No edema; No deformity   ASSESSMENT AND PLAN:    1.  Persistent atrial fibrillation: Post ablation 05/29/2021 with cardioversion 01/18/2024.  This was his first episode of atrial fibrillation since his ablation.  He is now happy with his control.  I have told him that it is unlikely that the whirlpool is what caused his episodes of atrial fibrillation.  He may wish to try it again.  For now, we [place] Scorsone continue with current management.  2.  Secondary hypercoagulable state: On Eliquis   4.  Hypertension: Well-controlled  Follow up with EP APP in 6 months  Signed, Tawania Daponte Gladis Norton, MD

## 2024-07-11 ENCOUNTER — Ambulatory Visit: Attending: Cardiology | Admitting: Cardiology

## 2024-07-11 ENCOUNTER — Encounter: Payer: Self-pay | Admitting: Cardiology

## 2024-07-11 VITALS — BP 130/82 | HR 65 | Ht 74.0 in | Wt 215.0 lb

## 2024-07-11 DIAGNOSIS — I10 Essential (primary) hypertension: Secondary | ICD-10-CM

## 2024-07-11 DIAGNOSIS — D6869 Other thrombophilia: Secondary | ICD-10-CM | POA: Diagnosis not present

## 2024-07-11 DIAGNOSIS — I4819 Other persistent atrial fibrillation: Secondary | ICD-10-CM

## 2024-07-11 NOTE — Patient Instructions (Signed)
 Medication Instructions:  Your physician recommends that you continue on your current medications as directed. Please refer to the Current Medication list given to you today.  *If you need a refill on your cardiac medications before your next appointment, please call your pharmacy*  Lab Work: None ordered   Testing/Procedures: None ordered  Follow-Up: At [facility], you and your health needs are our priority.  As part of our continuing mission to provide you with exceptional heart care, our providers are all part of one team.  This team includes your primary Cardiologist (physician) and Advanced Practice Providers or APPs (Physician Assistants and Nurse Practitioners) who all work together to provide you with the care you need, when you need it.  Your next appointment:   6 month(s)  Provider:   You will follow up in the Atrial Fibrillation Clinic located at [facility]. Your provider will be: Clint R. Fenton, PA-C     Thank you for choosing [employer]!!   Maeola Domino, RN [phone]

## 2024-08-30 NOTE — Progress Notes (Signed)
 History and Physical: Lance Hubbard is a 83 y.o. old right handed male with a history of hypothyroidism and chemotherapy referred to diagnostic neurology with concern for symptoms of neuropathy in bilateral lower extremities and left greater than right upper extremity.  He has some neck stiffness, that is not persistent, and he denies radicular neck or lower back pain.  Clinical Impression: Today's study is designed to test for polyneuropathy versus focal neuropathies.  Testing Results: Nerve conduction: The left sural and ulnar sensory studies are not responsive; the left radial sensory study has a low amplitude. The left fibular and tibial motor studies have a low amplitude, slowed conduction velocity, and absent F waves.  The left ulnar motor study has a mildly decreased amplitude throughout. EMG: The left tibialis anterior has increased insertional activity with near-continous spontaneous activity, along with moderately reduced recruitment of very tall motor units. The left first dorsal interosseus has polyphasic motor units but otherwise appears normal.  ______________________________________________________________________ Conclusion: This is an abnormal study. 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  ______________________________________________________________________  Clinical Note: Findings were reviewed with the patient who expressed understanding. ___________________________ Examined by, (electronic signature):  Donnice Lynwood Lunger Date/Time: [ 08/31/2024 2:31 PM ]   NOTE: See attached PDF for waveforms and numeric values.

## 2024-09-20 ENCOUNTER — Inpatient Hospital Stay (HOSPITAL_BASED_OUTPATIENT_CLINIC_OR_DEPARTMENT_OTHER)
Admission: EM | Admit: 2024-09-20 | Discharge: 2024-09-24 | DRG: 418 | Disposition: A | Discharge status: Home / Self Care | Attending: Internal Medicine | Admitting: Internal Medicine

## 2024-09-20 ENCOUNTER — Encounter (HOSPITAL_BASED_OUTPATIENT_CLINIC_OR_DEPARTMENT_OTHER): Payer: Self-pay

## 2024-09-20 ENCOUNTER — Other Ambulatory Visit: Payer: Self-pay

## 2024-09-20 ENCOUNTER — Emergency Department (HOSPITAL_BASED_OUTPATIENT_CLINIC_OR_DEPARTMENT_OTHER)

## 2024-09-20 DIAGNOSIS — K810 Acute cholecystitis: Principal | ICD-10-CM

## 2024-09-20 DIAGNOSIS — I5032 Chronic diastolic (congestive) heart failure: Secondary | ICD-10-CM | POA: Diagnosis present

## 2024-09-20 DIAGNOSIS — C9210 Chronic myeloid leukemia, BCR/ABL-positive, not having achieved remission: Secondary | ICD-10-CM | POA: Diagnosis present

## 2024-09-20 DIAGNOSIS — I5023 Acute on chronic systolic (congestive) heart failure: Secondary | ICD-10-CM | POA: Diagnosis present

## 2024-09-20 DIAGNOSIS — K8081 Other cholelithiasis with obstruction: Secondary | ICD-10-CM

## 2024-09-20 DIAGNOSIS — K8050 Calculus of bile duct without cholangitis or cholecystitis without obstruction: Secondary | ICD-10-CM

## 2024-09-20 DIAGNOSIS — I10 Essential (primary) hypertension: Secondary | ICD-10-CM | POA: Diagnosis present

## 2024-09-20 DIAGNOSIS — E039 Hypothyroidism, unspecified: Secondary | ICD-10-CM

## 2024-09-20 DIAGNOSIS — K851 Biliary acute pancreatitis without necrosis or infection: Secondary | ICD-10-CM | POA: Diagnosis present

## 2024-09-20 DIAGNOSIS — Z7901 Long term (current) use of anticoagulants: Secondary | ICD-10-CM

## 2024-09-20 DIAGNOSIS — K859 Acute pancreatitis without necrosis or infection, unspecified: Secondary | ICD-10-CM

## 2024-09-20 DIAGNOSIS — I5022 Chronic systolic (congestive) heart failure: Secondary | ICD-10-CM | POA: Diagnosis present

## 2024-09-20 DIAGNOSIS — E871 Hypo-osmolality and hyponatremia: Secondary | ICD-10-CM

## 2024-09-20 DIAGNOSIS — I480 Paroxysmal atrial fibrillation: Secondary | ICD-10-CM | POA: Diagnosis present

## 2024-09-20 DIAGNOSIS — R748 Abnormal levels of other serum enzymes: Secondary | ICD-10-CM

## 2024-09-20 LAB — CBC
HCT: 39.7 % (ref 39.0–52.0)
Hemoglobin: 13.4 g/dL (ref 13.0–17.0)
MCH: 29.5 pg (ref 26.0–34.0)
MCHC: 33.8 g/dL (ref 30.0–36.0)
MCV: 87.3 fL (ref 80.0–100.0)
Platelets: 222 K/uL (ref 150–400)
RBC: 4.55 MIL/uL (ref 4.22–5.81)
RDW: 13.8 % (ref 11.5–15.5)
WBC: 10.9 K/uL — ABNORMAL HIGH (ref 4.0–10.5)
nRBC: 0 % (ref 0.0–0.2)

## 2024-09-20 LAB — URINALYSIS, ROUTINE W REFLEX MICROSCOPIC
Glucose, UA: NEGATIVE mg/dL
Hgb urine dipstick: NEGATIVE
Ketones, ur: NEGATIVE mg/dL
Leukocytes,Ua: NEGATIVE
Nitrite: NEGATIVE
Specific Gravity, Urine: 1.025 (ref 1.005–1.030)
pH: 6.5 (ref 5.0–8.0)

## 2024-09-20 LAB — COMPREHENSIVE METABOLIC PANEL WITH GFR
ALT: 399 U/L — ABNORMAL HIGH (ref 0–44)
AST: 220 U/L — ABNORMAL HIGH (ref 15–41)
Albumin: 4.4 g/dL (ref 3.5–5.0)
Alkaline Phosphatase: 192 U/L — ABNORMAL HIGH (ref 38–126)
Anion gap: 11 (ref 5–15)
BUN: 16 mg/dL (ref 8–23)
CO2: 26 mmol/L (ref 22–32)
Calcium: 9.7 mg/dL (ref 8.9–10.3)
Chloride: 94 mmol/L — ABNORMAL LOW (ref 98–111)
Creatinine, Ser: 1.17 mg/dL (ref 0.61–1.24)
GFR, Estimated: >60 mL/min (ref >60)
Glucose, Bld: 118 mg/dL — ABNORMAL HIGH (ref 70–99)
Potassium: 4 mmol/L (ref 3.5–5.1)
Sodium: 130 mmol/L — ABNORMAL LOW (ref 135–145)
Total Bilirubin: 3.6 mg/dL — ABNORMAL HIGH (ref 0.0–1.2)
Total Protein: 7.8 g/dL (ref 6.5–8.1)

## 2024-09-20 LAB — LIPASE, BLOOD: Lipase: >2800 U/L — ABNORMAL HIGH (ref 11–51)

## 2024-09-20 LAB — MAGNESIUM: Magnesium: 2.1 mg/dL (ref 1.7–2.4)

## 2024-09-20 MED ORDER — CIPROFLOXACIN IN D5W 400 MG/200ML IV SOLN
400.0000 mg | Freq: Once | INTRAVENOUS | Status: AC
  Administered 2024-09-20: 400 mg via INTRAVENOUS
  Filled 2024-09-20: qty 200

## 2024-09-20 MED ORDER — MORPHINE SULFATE (PF) 2 MG/ML IV SOLN
2.0000 mg | Freq: Once | INTRAVENOUS | Status: AC
  Administered 2024-09-20: 2 mg via INTRAVENOUS
  Filled 2024-09-20: qty 1

## 2024-09-20 MED ORDER — METRONIDAZOLE 500 MG/100ML IV SOLN
500.0000 mg | Freq: Once | INTRAVENOUS | Status: AC
  Administered 2024-09-20: 500 mg via INTRAVENOUS
  Filled 2024-09-20: qty 100

## 2024-09-20 MED ORDER — MORPHINE SULFATE (PF) 4 MG/ML IV SOLN
4.0000 mg | Freq: Once | INTRAVENOUS | Status: AC
  Administered 2024-09-20: 4 mg via INTRAVENOUS
  Filled 2024-09-20: qty 1

## 2024-09-20 MED ORDER — IOHEXOL 300 MG/ML  SOLN
100.0000 mL | Freq: Once | INTRAMUSCULAR | Status: AC | PRN
  Administered 2024-09-20: 100 mL via INTRAVENOUS

## 2024-09-20 NOTE — ED Provider Notes (Signed)
 [place] EMERGENCY DEPARTMENT AT [facility] Provider Note   CSN: [id number] Arrival date & time: 09/20/24  1927     Patient presents with: Abdominal Pain   Lance Hubbard is a 83 y.o. male.  With a history of CML and atrial fibrillation on Eliquis  and hypertension who presents to the ED for abdominal pain.  Abdominal pain localized over upper abdomen shortly after eating lunch yesterday.  Last bowel movement 2 days ago.  No nausea vomiting diarrhea fevers or chills.  Has had similar abdominal pains in the last few months.  Currently undergoing chemotherapy for CML and is followed by Dr. Deward   HPI     Prior to Admission medications   Medication Sig Start Date End Date Taking? Authorizing Provider  acetaminophen  (TYLENOL ) 500 MG tablet Take 1,000 mg by mouth every 6 (six) hours as needed for mild pain (pain score 1-3) or moderate pain (pain score 4-6).   Yes [provider]  apixaban  (ELIQUIS ) 5 MG TABS tablet Take 1 tablet (5 mg total) by mouth every 12 (twelve) hours. 12/20/13  Yes Levern Hutching, MD  levothyroxine  (SYNTHROID ) 25 MCG tablet Take 25 mcg by mouth daily before breakfast. 03/19/21  Yes [provider]  rosuvastatin  (CRESTOR ) 5 MG tablet Take 5 mg by mouth at bedtime. Patient taking differently: Take 5 mg by mouth every other day.   Yes [provider]  sacubitril-valsartan (ENTRESTO ) 49-51 MG Take 0.5 tablets by mouth 2 (two) times daily.   Yes [provider]  clobetasol  cream (TEMOVATE ) 0.05 % Apply 1 application topically 2 (two) times daily as needed (rash).    [provider]  cyanocobalamin  2000 MCG tablet Take 2,000 mcg by mouth daily.    [provider]  hydrocortisone cream 1 % Apply 1 application topically 2 (two) times daily as needed for itching.    [provider]  influenza vaccine adjuvanted (FLUAD  QUADRIVALENT) 0.5 ML injection Inject into the muscle. 09/30/22     influenza vaccine  adjuvanted (FLUAD ) 0.5 ML injection Inject into the muscle. 09/04/21   Luiz Channel, MD  Polyethyl Glycol-Propyl Glycol (SYSTANE ULTRA) 0.4-0.3 % SOLN Place 1-2 drops into both eyes 3 (three) times daily as needed (for dry eyes).    [provider]  RSV vaccine recomb adjuvanted (AREXVY ) 120 MCG/0.5ML injection Inject into the muscle. 10/20/22   Luiz Channel, MD    Allergies: Amoxicillin and Penicillin g    Review of Systems  Updated Vital Signs BP 116/82   Pulse (!) 58   Temp 98.8 F (37.1 C) (Oral)   Resp 19   Ht 6' 2 (1.88 m)   Wt 99.8 kg   SpO2 100%   BMI 28.25 kg/m   Physical Exam Vitals and nursing note reviewed.  HENT:     Head: Normocephalic and atraumatic.  Eyes:     Pupils: Pupils are equal, round, and reactive to light.  Cardiovascular:     Rate and Rhythm: Normal rate and regular rhythm.  Pulmonary:     Effort: Pulmonary effort is normal.     Breath sounds: Normal breath sounds.  Abdominal:     Palpations: Abdomen is soft.     Tenderness: There is abdominal tenderness in the epigastric area. There is no guarding or rebound.  Skin:    General: Skin is warm and dry.  Neurological:     Mental Status: He is alert.  Psychiatric:        Mood and Affect: Mood  normal.     (all labs ordered are listed, but only abnormal results are displayed) Labs Reviewed  LIPASE, BLOOD - Abnormal; Notable for the following components:      Result Value   Lipase >2,800 (*)    All other components within normal limits  COMPREHENSIVE METABOLIC PANEL WITH GFR - Abnormal; Notable for the following components:   Sodium 130 (*)    Chloride 94 (*)    Glucose, Bld 118 (*)    AST 220 (*)    ALT 399 (*)    Alkaline Phosphatase 192 (*)    Total Bilirubin 3.6 (*)    All other components within normal limits  CBC - Abnormal; Notable for the following components:   WBC 10.9 (*)    All other components within normal limits  URINALYSIS, ROUTINE W REFLEX MICROSCOPIC -  Abnormal; Notable for the following components:   Bilirubin Urine SMALL (*)    Protein, ur TRACE (*)    All other components within normal limits  MAGNESIUM     EKG: None  Radiology: CT ABDOMEN PELVIS W CONTRAST Result Date: 09/20/2024 EXAM: CT ABDOMEN AND PELVIS WITH CONTRAST 09/20/2024 09:23:03 PM TECHNIQUE: CT of the abdomen and pelvis was performed with the administration of 100 mL of iohexol  (OMNIPAQUE ) 300 MG/ML solution. Multiplanar reformatted images are provided for review. Automated exposure control, iterative reconstruction, and/or weight-based adjustment of the mA/kV was utilized to reduce the radiation dose to as low as reasonably achievable. COMPARISON: Comparison with 03/26/2005. CLINICAL HISTORY: Bowel obstruction suspected; upper abd pain, constipation. FINDINGS: LOWER CHEST: Small right pleural effusion with basilar atelectasis or consolidation. Small esophageal hiatal hernia. LIVER: Circumscribed cyst in the left lobe of the liver. No imaging follow-up is indicated. GALLBLADDER AND BILE DUCTS: Cholelithiasis with several stones in the gallbladder. Mild gallbladder wall thickening and pericholecystic edema. Changes may indicate acute cholecystitis in the appropriate clinical setting. There is a common duct stone in the distal common bile duct with mild bile duct dilatation. SPLEEN: Spleen is normal. PANCREAS: Infiltration and edema around the pancreas consistent with acute pancreatitis. No pancreatic ductal dilatation. No loculated collections. ADRENAL GLANDS: Adrenal glands are normal. KIDNEYS, URETERS AND BLADDER: The kidneys are normal. No stones in the kidneys or ureters. No hydronephrosis. No perinephric or periureteral stranding. Bladder is normal. GI AND BOWEL: Stomach demonstrates no acute abnormality. There is no bowel obstruction. PERITONEUM AND RETROPERITONEUM: No ascites. No free air. VASCULATURE: Calcification of the aorta. No aneurysm. LYMPH NODES: No significant  lymphadenopathy. REPRODUCTIVE ORGANS: Prostate gland is markedly enlarged, measuring 7 cm in diameter. BONES AND SOFT TISSUES: Degenerative changes in the spine. Small right inguinal hernia containing fat. No acute osseous abnormality. No focal soft tissue abnormality. IMPRESSION: 1. Findings consistent with acute pancreatitis without pancreatic ductal dilatation or loculated collections. 2. Cholelithiasis with several gallbladder stones, mild gallbladder wall thickening, and pericholecystic edema, suggestive of acute cholecystitis, with a distal common bile duct stone and mild biliary dilatation. 3. Small right pleural effusion with basilar atelectasis or consolidation. 4. Small right inguinal hernia containing fat. Electronically signed by: Elsie Gravely MD 09/20/2024 09:31 PM EST RP Workstation: [id number]     Procedures   Medications Ordered in the ED  ciprofloxacin  (CIPRO ) IVPB 400 mg (400 mg Intravenous New Bag/Given 09/20/24 2230)    And  metroNIDAZOLE  (FLAGYL ) IVPB 500 mg (has no administration in time range)  morphine  (PF) 2 MG/ML injection 2 mg (2 mg Intravenous Given 09/20/24 2025)  morphine  (PF) 4 MG/ML injection 4 mg (  4 mg Intravenous Given 09/20/24 2107)  iohexol  (OMNIPAQUE ) 300 MG/ML solution 100 mL (100 mLs Intravenous Contrast Given 09/20/24 2115)    Clinical Course as of 09/20/24 2308  Wed Sep 20, 2024  2158 Transaminitis on labs.  Lipase over 2800.  No UTI.  CT abdomen pelvis shows acute pancreatitis along with cholelithiasis and acute cholecystitis.  Will cover with antibiotics here Cipro  Flagyl  (multiple antibiotic allergies) and reach out to general surgery [MP]  2221 Discussed with Dr. Polly (general surgery).  No plan for cholecystectomy at this time.  Recommends reaching out to GI for potential stone clearance and admission to medicine.  Agrees with plan for antibiotics.  Paged GI [MP]  2230 Discussed with Dr. Dianna (GI) who recommends admission to [facility] and  evaluation.  By GI team in the morning.  Discussed with admitting hospitalist Dr.Kakrakandy.  Who accepts patient for admissionkrakandy  [MP]    Clinical Course User Index [MP] Pamella Ozell LABOR, DO                                 Medical Decision Making 83 year old male with history as above presented to the ED for abdominal pain x 2 days.  Localized over epigastric region.  Some associated constipation.  No nausea vomiting.  Afebrile slightly hypertensive here.    Differential diagnosis includes: Bowel obstruction Acute intra-abdominal infectious/inflammatory process such as appendicitis, diverticulitis, pancreatitis and cholecystitis Urinary tract infection Atypical presentation for pneumonia Viral gastroenteritis  Will obtain laboratory workup including CBC with differential, metabolic panel, lipase and urinalysis along with CT abdomen pelvis Morphine  for pain control and Zofran  as needed for nausea and vomiting  Amount and/or Complexity of Data Reviewed Labs: ordered. Radiology: ordered.  Risk Prescription drug management. Decision regarding hospitalization.        Final diagnoses:  Acute cholecystitis  Biliary calculus of other site with obstruction  Acute pancreatitis, unspecified complication status, unspecified pancreatitis type  CML (chronic myelocytic leukemia) [facility])    ED Discharge Orders     None          Pamella Ozell LABOR, DO 09/20/24 2308

## 2024-09-20 NOTE — ED Triage Notes (Signed)
 Pt reports upper abd pain since yesterday. Pt denies any N/V/D. Pt reports similar pains over the last two months. Pt reports constipation intermittently over the last four months. Pt also being treated for CML.

## 2024-09-21 ENCOUNTER — Inpatient Hospital Stay (HOSPITAL_COMMUNITY)

## 2024-09-21 DIAGNOSIS — R748 Abnormal levels of other serum enzymes: Secondary | ICD-10-CM | POA: Diagnosis not present

## 2024-09-21 DIAGNOSIS — I5032 Chronic diastolic (congestive) heart failure: Secondary | ICD-10-CM

## 2024-09-21 DIAGNOSIS — Z7901 Long term (current) use of anticoagulants: Secondary | ICD-10-CM

## 2024-09-21 DIAGNOSIS — K851 Biliary acute pancreatitis without necrosis or infection: Secondary | ICD-10-CM

## 2024-09-21 DIAGNOSIS — Z01810 Encounter for preprocedural cardiovascular examination: Secondary | ICD-10-CM | POA: Diagnosis not present

## 2024-09-21 DIAGNOSIS — E871 Hypo-osmolality and hyponatremia: Secondary | ICD-10-CM

## 2024-09-21 DIAGNOSIS — K858 Other acute pancreatitis without necrosis or infection: Secondary | ICD-10-CM

## 2024-09-21 DIAGNOSIS — K8050 Calculus of bile duct without cholangitis or cholecystitis without obstruction: Secondary | ICD-10-CM

## 2024-09-21 LAB — CBC WITH DIFFERENTIAL/PLATELET
Abs Immature Granulocytes: 0.02 K/uL (ref 0.00–0.07)
Basophils Absolute: 0 K/uL (ref 0.0–0.1)
Basophils Relative: 0 %
Eosinophils Absolute: 0.2 K/uL (ref 0.0–0.5)
Eosinophils Relative: 2 %
HCT: 37.9 % — ABNORMAL LOW (ref 39.0–52.0)
Hemoglobin: 12.7 g/dL — ABNORMAL LOW (ref 13.0–17.0)
Immature Granulocytes: 0 %
Lymphocytes Relative: 16 %
Lymphs Abs: 1.3 K/uL (ref 0.7–4.0)
MCH: 29.2 pg (ref 26.0–34.0)
MCHC: 33.5 g/dL (ref 30.0–36.0)
MCV: 87.1 fL (ref 80.0–100.0)
Monocytes Absolute: 0.9 K/uL (ref 0.1–1.0)
Monocytes Relative: 11 %
Neutro Abs: 5.6 K/uL (ref 1.7–7.7)
Neutrophils Relative %: 71 %
Platelets: 184 K/uL (ref 150–400)
RBC: 4.35 MIL/uL (ref 4.22–5.81)
RDW: 13.7 % (ref 11.5–15.5)
WBC: 8 K/uL (ref 4.0–10.5)
nRBC: 0 % (ref 0.0–0.2)

## 2024-09-21 LAB — ECHOCARDIOGRAM LIMITED
Area-P 1/2: 4.6 cm2
Height: 74 in
S' Lateral: 4.3 cm
Weight: 3520 [oz_av]

## 2024-09-21 LAB — COMPREHENSIVE METABOLIC PANEL WITH GFR
ALT: 265 U/L — ABNORMAL HIGH (ref 0–44)
AST: 126 U/L — ABNORMAL HIGH (ref 15–41)
Albumin: 3.6 g/dL (ref 3.5–5.0)
Alkaline Phosphatase: 149 U/L — ABNORMAL HIGH (ref 38–126)
Anion gap: 12 (ref 5–15)
BUN: 12 mg/dL (ref 8–23)
CO2: 23 mmol/L (ref 22–32)
Calcium: 8.5 mg/dL — ABNORMAL LOW (ref 8.9–10.3)
Chloride: 98 mmol/L (ref 98–111)
Creatinine, Ser: 1.07 mg/dL (ref 0.61–1.24)
GFR, Estimated: >60 mL/min (ref >60)
Glucose, Bld: 83 mg/dL (ref 70–99)
Potassium: 3.9 mmol/L (ref 3.5–5.1)
Sodium: 133 mmol/L — ABNORMAL LOW (ref 135–145)
Total Bilirubin: 4.1 mg/dL — ABNORMAL HIGH (ref 0.0–1.2)
Total Protein: 6.5 g/dL (ref 6.5–8.1)

## 2024-09-21 MED ORDER — METRONIDAZOLE 500 MG/100ML IV SOLN
500.0000 mg | Freq: Two times a day (BID) | INTRAVENOUS | Status: DC
  Administered 2024-09-21 – 2024-09-23 (×5): 500 mg via INTRAVENOUS
  Filled 2024-09-21 (×5): qty 100

## 2024-09-21 MED ORDER — SODIUM CHLORIDE 0.9 % IV SOLN
2.0000 g | Freq: Every day | INTRAVENOUS | Status: DC
  Administered 2024-09-21 – 2024-09-23 (×3): 2 g via INTRAVENOUS
  Filled 2024-09-21 (×3): qty 20

## 2024-09-21 MED ORDER — HYDROMORPHONE HCL 1 MG/ML IJ SOLN: 1.0000 mg | INTRAMUSCULAR | Status: DC | PRN

## 2024-09-21 MED ORDER — ONDANSETRON HCL 4 MG/2ML IJ SOLN: 4.0000 mg | Freq: Four times a day (QID) | INTRAMUSCULAR | Status: DC | PRN

## 2024-09-21 MED ORDER — MORPHINE SULFATE (PF) 4 MG/ML IV SOLN
4.0000 mg | Freq: Once | INTRAVENOUS | Status: AC
  Administered 2024-09-21: 4 mg via INTRAVENOUS
  Filled 2024-09-21: qty 1

## 2024-09-21 MED ORDER — SODIUM CHLORIDE 0.9 % IV SOLN: INTRAVENOUS | Status: DC

## 2024-09-21 MED ORDER — LEVOTHYROXINE SODIUM 50 MCG PO TABS
25.0000 ug | ORAL_TABLET | Freq: Every day | ORAL | Status: DC
  Administered 2024-09-21 – 2024-09-24 (×4): 25 ug via ORAL
  Filled 2024-09-21 (×4): qty 1

## 2024-09-21 MED ORDER — HYDRALAZINE HCL 20 MG/ML IJ SOLN: 10.0000 mg | Freq: Four times a day (QID) | INTRAMUSCULAR | Status: DC | PRN

## 2024-09-21 MED ORDER — PANTOPRAZOLE SODIUM 40 MG PO TBEC
40.0000 mg | DELAYED_RELEASE_TABLET | Freq: Every day | ORAL | Status: DC
  Administered 2024-09-21 – 2024-09-24 (×4): 40 mg via ORAL
  Filled 2024-09-21 (×4): qty 1

## 2024-09-21 NOTE — Assessment & Plan Note (Addendum)
 Gallstone pancreatitis and cholecystitis.  Elevated liver enzymes due to choledocholithiasis   11/28 laparoscopic cholecystectomy, due to inflammation not performed intra operative cholangiogram.  Wbc  is 11.5, AST and ALT trending down to 58 and 164 respectively, total bilirubin down to 1,4  Patient tolerating well regular diet. Follow up liver US  with common bile duct diameter measuring 3 mm.   Patient has been on IV antibiotic therapy with Ceftriaxone  and metronidazole , surgery recommended 4 days of antibiotics post op, will plan to continue with po ciprofloxacin  and metronidazole .   Continue pain control with acetaminophen  and oxycodone  as needed.

## 2024-09-21 NOTE — Assessment & Plan Note (Addendum)
 Echocardiogram with reduced LV systolic function 30 to 35%, global hypokinesis, RV systolic function preserved, RVSP 65.1 mmHg, LA with moderate dilatation, no significant valvular disease.   No signs of decompensation Patient will resume entresto  for heart failure.  Will need repeat echocardiogram in 3 months, if persistent low EF, may need up escalation of guideline directed medical therapy.

## 2024-09-21 NOTE — Progress Notes (Addendum)
 Progress Note   Patient: Lance Hubbard FMW:[id number] DOB: [birth year] DOA: 09/20/2024     0 DOS: the patient was seen and examined on 09/21/2024   Brief hospital course: [name] was admitted to the hospital with the working diagnosis of acute pancreatitis.   83 yo male with the past medical history of hypertension, CML, hypothyroidism, hyperlipidemia and atrial fibrillation who presented with abdominal pain, nausea and vomiting. Reported intermittent epigastric abdominal pain for the last 6 weeks, but this time pain was more persistent, severe and associated with nausea and vomiting, prompting him to come to the ed.  On his initial physical examination his blood pressure was 148/75, HR 73, RR 18 and 02 saturation 100% on room air Lungs with no wheezing or rhonchi, heart with S1 and S2 present and regular, abdomen with tenderness to palpation at the epigastrium with no rebound or guarding, no lower extremity edema.   Na 130, K 4.0 Cl 94 bicarbonate 26 glucose 118, bun 16 cr 1,17  ALK P 192, lipase > 2,800  AST 220 ALT 399 total bilirubin 3,6  Wbc 10,9 hgb 13.5 plt 222  Urine analysis SG 1,025, protein trace, negative glucose, negative leukocytes, negative hgb.   EKG 82 bpm, normal axis, normal intervals, qtc 450, sinus rhythm with PAC and PVH, with no significant ST segment or T wave changes.   CT abdomen and pelvis with acute pancreatitis without pancreatic ductal dilatation or loculated collections.  Cholelithiasis with several gallbladder stone, mild gallbladder wall thickening and pericholecystic edema, suggesting acute cholecystitis, with a distal common bile duct stone and mild bilary dilatation.  Small right pleural effusion.  Small right inguinal hernia containing fat.   Patient placed on IV fluids and IV antibiotic therapy NPO  Consulted GI for possible ERCP   Assessment and Plan: * Acute gallstone pancreatitis Gallstone pancreatitis and cholecystitis.  Elevated liver  enzymes due to choledocholithiasis   Patient today with no abdominal pain, no nausea or vomiting, he has been NPO.  Plan to continue supportive medical therapy with IV fluids IV antibiotic therapy with Ceftriaxone  and metronidazole .  Continue NPO for now.  As needed analgesics and antiemetics Scheduled proton pump inhibitor.  Follow up with GI recommendations, may need ERCP.  Possible cholecystectomy on this admission.   HTN (hypertension) Continue to hold on entresto  for now, to prevent hypotension Continue blood pressure monitoring   Chronic diastolic heart failure (HCC) Echocardiogram from 2020 with preserved LV systolic function with EF 60 to 65%, mild LVH, with no significant valvular disease.   No signs of decompensation Plan to hold on entersto for now due to risk of hypotension.  Check limited echocardiogram, since patient may need cholecystectomy.  At home he is physically functional, at least 4 mets.   Paroxysmal atrial fibrillation (HCC) Patient on sinus rhythm. He is not on AV blockade at home Currently on hold apixaban  due to potential endoscopic procedure.   Hyponatremia Renal function stable with serum cr at 1,0 with K at 3,9 and serum bicarbonate at 23 Na 133   Plan to continue gentle hydration with isotonic saline, will decrease rate to 50 ml per hr.  Follow up renal function and electrolytes in am.   CML (chronic myelocytic leukemia) (HCC) Cell count stable, plan to follow up as outpatient.         Subjective: Patient is feeling well, today with no chest pain and no dyspnea, no nausea or vomiting he has been NPO   Physical Exam: Vitals:  09/21/24 0200 09/21/24 0300 09/21/24 0345 09/21/24 0557  BP: (!) 123/56 (!) 109/57  113/65  Pulse: (!) 58  (!) 53 61  Resp: (!) 21 18 19 19   Temp:    97.8 F (36.6 C)  TempSrc:    Oral  SpO2: 95%  96% 97%  Weight:      Height:       Neurology awake and alert ENT with no pallor or icterus Cardiovascular  with S1 and S2 present and regular with no gallops, rubs or murmurs Respiratory with no rales or wheezing, no rhonchi  Abdomen with no distention, not tender to palpation, no rebound or guarding  No lower extremity edema.   Data Reviewed:    Family Communication: no family at the bedside   Disposition: Status is: Inpatient Remains inpatient appropriate because:   Planned Discharge Destination: Home     Author: Elidia Toribio Furnace, MD 09/21/2024 9:22 AM  For on call review [email or web address].

## 2024-09-21 NOTE — Assessment & Plan Note (Addendum)
 At the time of discharge patient will resume taking entresto  for blood pressure control.  This agent was held during his hospitalization to prevent hypotension.

## 2024-09-21 NOTE — H&P (Signed)
 History and Physical    Patient: Lance Hubbard FMW:[id number] DOB: December 21, 1940 DOA: 09/20/2024 DOS: the patient was seen and examined on 09/21/2024 PCP: Lance Charleston, MD  Patient coming from: Home  Chief Complaint: Abdominal pain with nausea and vomiting Chief Complaint  Patient presents with   Abdominal Pain   HPI: Lance Hubbard is a 83 y.o. male with medical history significant of hypertension, CML, hypothyroidism, hyperlipidemia as well as atrial fibrillation who presented to [facility] emergency room with complaints of abdominal pain with associated nausea and vomiting.  According to patient he first had an episode of this pain about 6 weeks ago with associated nausea however this lasted for approximately few hours.  He then had another episode about a month ago which also resolved on its own.  The current episode started the night before yesterday with associated nausea.  Patient initially was about 8-9/10 localized in the upper part of the abdomen.  Patient therefore presented to [facility] emergency room for further management.  Denies chest pain, headache, dizziness or urinary complaints  ED course: Vitals upon arrival to [facility] emergency room vitals showed temperature 98.8, respiratory rate 18, pulse 73, blood pressure 148/75 saturating 100% on room air Lab results were significant of hyponatremia 130, alkaline phosphatase 192, lipase greater than 2800, AST 220, ALT 399 with total bilirubin of 3.6. ER physician discussed with general surgeon Lance Hubbard who felt the patient is not a candidate for surgery and the ER physician discussed with gastroenterologist Dr. Dianna who will see patient in consult.  Review of Systems: As mentioned in the history of present illness. All other systems reviewed and are negative. Past Medical History:  Diagnosis Date   Arthritis    CML (chronic myelocytic leukemia) (HCC)    in remission   History of kidney stones    Hypertension     Persistent atrial fibrillation (HCC)    Atrial Fibrillation   Past Surgical History:  Procedure Laterality Date   ATRIAL FIBRILLATION ABLATION N/A 05/29/2021   Procedure: ATRIAL FIBRILLATION ABLATION;  Surgeon: Lance Agent, MD;  [facility]: [facility];  Service: Cardiovascular;  Laterality: N/A;   CARDIOVERSION N/A 12/18/2013   Procedure: CARDIOVERSION;  Surgeon: Lance GORMAN Negri, MD;  Location: [facility];  Service: Cardiovascular;  Laterality: N/A;  9:58 Lido 40 mg, IV Propofol  40 mg, IV for anesthesia,  synched cardioversio @ 120 joules with successful NSR    CARDIOVERSION N/A 01/18/2024   Procedure: CARDIOVERSION;  Surgeon: Lance Oneil BROCKS, MD;  Location: [facility];  Service: Cardiovascular;  Laterality: N/A;   COLONOSCOPY     KNEE ARTHROSCOPY     knee dsurgery     LEFT HEART CATHETERIZATION WITH CORONARY ANGIOGRAM N/A 12/19/2013   Procedure: LEFT HEART CATHETERIZATION WITH CORONARY ANGIOGRAM;  Surgeon: Lance LOISE Chroman, MD;  Location: [facility];  Service: Cardiovascular;  Laterality: N/A;   LUMBAR LAMINECTOMY/DECOMPRESSION MICRODISCECTOMY N/A 05/17/2019   Procedure: Lumbar Four Lumbar Five Lumbar Laminectomy;  Surgeon: Lance Charleston, MD;  Location: [facility] OR;  Service: Neurosurgery;  Laterality: N/A;  Lumbar 4 Lumbar 5 Lumbar Laminectomy   TEE WITHOUT CARDIOVERSION N/A 12/18/2013   Procedure: TRANSESOPHAGEAL ECHOCARDIOGRAM (TEE);  Surgeon: Lance GORMAN Negri, MD;  Location: [facility] ENDOSCOPY;  Service: Cardiovascular;  Laterality: N/A;   Social History:  reports that he quit smoking about 5 years ago. His smoking use included pipe. He has never used smokeless tobacco. He reports that he does not currently use alcohol. He reports that he does  not use drugs.  Allergies  Allergen Reactions   Amoxicillin Hives and Other (See Comments)    REACTION: whelps/pain in knees and ankles   Penicillin G     Other reaction(s): hives    Family History  Problem Relation Age of Onset   Heart  disease Father    Hypertension Sister     Prior to Admission medications   Medication Sig Start Date End Date Taking? Authorizing Provider  acetaminophen  (TYLENOL ) 500 MG tablet Take 1,000 mg by mouth every 6 (six) hours as needed for mild pain (pain score 1-3) or moderate pain (pain score 4-6).   Yes [provider]  apixaban  (ELIQUIS ) 5 MG TABS tablet Take 1 tablet (5 mg total) by mouth every 12 (twelve) hours. 12/20/13  Yes Lance Hutching, MD  bosutinib (BOSULIF) 400 MG tablet Take 400 mg by mouth daily with supper.   Yes [provider]  levothyroxine  (SYNTHROID ) 25 MCG tablet Take 25 mcg by mouth daily before breakfast. 03/19/21  Yes [provider]  rosuvastatin  (CRESTOR ) 5 MG tablet Take 5 mg by mouth at bedtime. Patient taking differently: Take 5 mg by mouth every other day.   Yes [provider]  sacubitril-valsartan (ENTRESTO ) 49-51 MG Take 0.5 tablets by mouth 2 (two) times daily.   Yes [provider]  clobetasol  cream (TEMOVATE ) 0.05 % Apply 1 application topically 2 (two) times daily as needed (rash).    [provider]  cyanocobalamin  2000 MCG tablet Take 2,000 mcg by mouth daily.    [provider]  hydrocortisone cream 1 % Apply 1 application topically 2 (two) times daily as needed for itching.    [provider]  influenza vaccine adjuvanted (FLUAD  QUADRIVALENT) 0.5 ML injection Inject into the muscle. 09/30/22     influenza vaccine adjuvanted (FLUAD ) 0.5 ML injection Inject into the muscle. 09/04/21   Lance Channel, MD  Polyethyl Glycol-Propyl Glycol (SYSTANE ULTRA) 0.4-0.3 % SOLN Place 1-2 drops into both eyes 3 (three) times daily as needed (for dry eyes).    [provider]  RSV vaccine recomb adjuvanted (AREXVY ) 120 MCG/0.5ML injection Inject into the muscle. 10/20/22   Lance Channel, MD    Physical Exam: Vitals:   09/21/24 0200 09/21/24 0300 09/21/24 0345 09/21/24 0557  BP: (!) 123/56  (!) 109/57  113/65  Pulse: (!) 58  (!) 53 61  Resp: (!) 21 18 19 19   Temp:    97.8 F (36.6 C)  TempSrc:    Oral  SpO2: 95%  96% 97%  Weight:      Height:       Vitals and nursing note reviewed.  Constitutional: Laying in bed in no acute distress HENT:     Head: Normocephalic and atraumatic.  Cardiovascular:     Rate and Rhythm: Normal rate and regular rhythm.     Heart sounds: Normal heart sounds.  Pulmonary:     Effort: Pulmonary effort is normal.     Breath sounds: Normal breath sounds.  Abdominal: Mild epigastric tenderness noted Neurological: Alert and oriented x 3 able to move all 4 extremities equally   Data Reviewed: CT scan of the abdomen showing findings of acute pancreatitis as well as acute cholecystitis with several gallstones     Latest Ref Rng & Units 09/20/2024    7:36 PM 01/17/2024    8:57 AM 05/06/2021   11:08 AM  CBC  WBC 4.0 - 10.5 K/uL 10.9  7.9  8.0   Hemoglobin 13.0 -  17.0 g/dL 86.5  86.5  87.1   Hematocrit 39.0 - 52.0 % 39.7  41.2  37.8   Platelets 150 - 400 K/uL 222  219  181        Latest Ref Rng & Units 09/20/2024    7:36 PM 01/17/2024    8:57 AM 05/06/2021   11:08 AM  BMP  Glucose 70 - 99 mg/dL 881  897  97   BUN 8 - 23 mg/dL 16  17  16    Creatinine 0.61 - 1.24 mg/dL 8.82  9.01  8.99   BUN/Creat Ratio 10 - 24   16   Sodium 135 - 145 mmol/L 130  133  137   Potassium 3.5 - 5.1 mmol/L 4.0  3.9  4.3   Chloride 98 - 111 mmol/L 94  100  101   CO2 22 - 32 mmol/L 26  22  23    Calcium 8.9 - 10.3 mg/dL 9.7  9.1  9.2       Assessment and Plan: Acute pancreatitis possibly due to gallstone Acute cholecystitis Hyperbilirubinemia with elevated liver enzymes CT scan of the abdomen showing findings consistent with acute pancreatitis without pancreatic ductal dilatation. Also shows cholelithiasis with several gallstones and sonographic evidence of acute cholecystitis with distal common bile duct stone and mild biliary dilatation Patient presented  with elevated lipase greater than 2800, alkaline phosphatase 192, AST 220, ALT 399 with total bilirubin of 3.6. ER physician discussed with general surgeon Lance Hubbard who felt the patient is not a candidate for surgery and ER physician discussed with gastroenterologist Dr. Dianna who will see patient in consult as patient may need ERCP We will keep n.p.o. Placed on IV fluid Placed on analgesic therapy as well as antiemetics Placed on ceftriaxone  and Flagyl  as patient is allergic to penicillins and unable to take Zosyn  Hyponatremia likely secondary to dehydration in the setting of nausea with reduced oral intake Continue IV fluid as above Monitor electrolytes closely  Essential hypertension We will keep on IV hydralazine  while n.p.o. Monitor blood pressure closely Currently blood pressure appears soft  CML Outpatient follow-up He follows up with oncologist at [facility]  Hypothyroidism  On levothyroxine  outpatient  Hyperlipidemia  Holding statins at this time given elevated liver function  Chronic atrial fibrillation  Currently rate controlled Patient takes Eliquis  at home but this is on hold now given possible surgical intervention   Advance Care Planning:   Code Status: Prior full code  Consults: General Surgery, gastroenterology  Family Communication: None present at bedside  Severity of Illness: The appropriate patient status for this patient is INPATIENT. Inpatient status is judged to be reasonable and necessary in order to provide the required intensity of service to ensure the patient's safety. The patient's presenting symptoms, physical exam findings, and initial radiographic and laboratory data in the context of their chronic comorbidities is felt to place them at high risk for further clinical deterioration. Furthermore, it is not anticipated that the patient will be medically stable for discharge from the hospital within 2 midnights of admission.   * I certify  that at the point of admission it is my clinical judgment that the patient will require inpatient hospital care spanning beyond 2 midnights from the point of admission due to high intensity of service, high risk for further deterioration and high frequency of surveillance required.*  Author: Drue ONEIDA Potter, MD 09/21/2024 6:16 AM  For on call review [email or web address].

## 2024-09-21 NOTE — Consult Note (Addendum)
 Surgical Evaluation Requesting provider: Dr. Noralee  Chief Complaint: Abdominal pain  HPI: 83 year old male with history of atrial fibrillation status post ablation on Eliquis , CML in remission, hypertension, kidney stones who presented to the med center at [facility] yesterday evening with abdominal pain.  This began around midday yesterday and was localized to the upper abdomen.  No associated nausea, vomiting, fevers, diarrhea or chills.  He has had similar intermittent pain in the last few months.  Workup in the emergency department notable for lipase greater than 2800, total bilirubin 3.6 with moderate AST/ALT elevation and CT demonstrating acute pancreatitis along with cholelithiasis and choledocholithiasis with biliary dilatation.  This morning he reports his pain is completely resolved.  He denies any previous abdominal surgeries.  Reports his cardiologist has told him that his heart is in good stead.  Allergies  Allergen Reactions   Amoxicillin Hives and Other (See Comments)    REACTION: whelps/pain in knees and ankles   Penicillin G     Other reaction(s): hives    Past Medical History:  Diagnosis Date   Arthritis    CML (chronic myelocytic leukemia) (HCC)    in remission   History of kidney stones    Hypertension    Persistent atrial fibrillation (HCC)    Atrial Fibrillation    Past Surgical History:  Procedure Laterality Date   ATRIAL FIBRILLATION ABLATION N/A 05/29/2021   Procedure: ATRIAL FIBRILLATION ABLATION;  Surgeon: Kelsie Agent, MD;  [facility];  Service: Cardiovascular;  Laterality: N/A;   CARDIOVERSION N/A 12/18/2013   Procedure: CARDIOVERSION;  Surgeon: Salena GORMAN Negri, MD;  Location: [facility]OS[facility];  Service: Cardiovascular;  Laterality: N/A;  9:58 Lido 40 mg, IV Propofol  40 mg, IV for anesthesia,  synched cardioversio @ 120 joules with successful NSR    CARDIOVERSION N/A 01/18/2024   Procedure: CARDIOVERSION;  Surgeon: Jeffrie Oneil BROCKS, MD;   Location: [facility];  Service: Cardiovascular;  Laterality: N/A;   COLONOSCOPY     KNEE ARTHROSCOPY     knee dsurgery     LEFT HEART CATHETERIZATION WITH CORONARY ANGIOGRAM N/A 12/19/2013   Procedure: LEFT HEART CATHETERIZATION WITH CORONARY ANGIOGRAM;  Surgeon: Rober LOISE Chroman, MD;  Location: [facility]ATH [facility];  Service: Cardiovascular;  Laterality: N/A;   LUMBAR LAMINECTOMY/DECOMPRESSION MICRODISCECTOMY N/A 05/17/2019   Procedure: Lumbar Four Lumbar Five Lumbar Laminectomy;  Surgeon: Alix Charleston, MD;  Location: [facility] OR;  Service: Neurosurgery;  Laterality: N/A;  Lumbar 4 Lumbar 5 Lumbar Laminectomy   TEE WITHOUT CARDIOVERSION N/A 12/18/2013   Procedure: TRANSESOPHAGEAL ECHOCARDIOGRAM (TEE);  Surgeon: Salena GORMAN Negri, MD;  Location: [facility] ENDOSCOPY;  Service: Cardiovascular;  Laterality: N/A;    Family History  Problem Relation Age of Onset   Heart disease Father    Hypertension Sister     Social History   Socioeconomic History   Marital status: Married    Spouse name: Not on file   Number of children: Not on file   Years of education: Not on file   Highest education level: Not on file  Occupational History   Not on file  Tobacco Use   Smoking status: Former    Types: Pipe    Quit date: 11/08/2018    Years since quitting: 5.8   Smokeless tobacco: Never   Tobacco comments:    Former smoker 06/26/2021  Vaping Use   Vaping status: Never Used  Substance and Sexual Activity   Alcohol use: Not Currently    Comment: stopped around 2012  Drug use: No   Sexual activity: Not on file  Other Topics Concern   Not on file  Social History Narrative   Lives in [place] with spouse.   Retired art gallery manager in [employer]   Social Drivers of Corporate Investment Banker Strain: Not on [employer] Insecurity: Low Risk  (05/19/2024)   Received from [facility]   Hunger Vital Sign    Within the past 12 months, you worried that your food would run out before you got money to buy more:  Not on file    Within the past 12 months, the food you bought just didn't last and you didn't have money to get more. : Never true  Transportation Needs: No Transportation Needs (05/19/2024)   Received from [facility]    In the past 12 months, has lack of reliable transportation kept you from medical appointments, meetings, work or from getting things needed for daily living? : No  Physical Activity: Not on file  Stress: Not on file  Social Connections: Not on file    No current facility-administered medications on file prior to encounter.   Current Outpatient Medications on File Prior to Encounter  Medication Sig Dispense Refill   acetaminophen  (TYLENOL ) 500 MG tablet Take 1,000 mg by mouth every 6 (six) hours as needed for mild pain (pain score 1-3) or moderate pain (pain score 4-6).     apixaban  (ELIQUIS ) 5 MG TABS tablet Take 1 tablet (5 mg total) by mouth every 12 (twelve) hours. 60 tablet 3   bosutinib (BOSULIF) 400 MG tablet Take 400 mg by mouth every evening.     clobetasol  cream (TEMOVATE ) 0.05 % Apply 1 application topically 2 (two) times daily as needed (rash).     levothyroxine  (SYNTHROID ) 25 MCG tablet Take 25 mcg by mouth daily before breakfast.     rosuvastatin (CRESTOR) 5 MG tablet Take 5 mg by mouth at bedtime. (Patient taking differently: Take 5 mg by mouth every other day.)     sacubitril-valsartan (ENTRESTO ) 49-51 MG Take 0.5 tablets by mouth 2 (two) times daily.      Review of Systems: a complete, 10pt review of systems was completed with pertinent positives and negatives as documented in the HPI  Physical Exam: Vitals:   09/21/24 0557 09/21/24 0757  BP: 113/65   Pulse: 61   Resp: 19 19  Temp: 97.8 F (36.6 C) 97.9 F (36.6 C)  SpO2: 97%    Gen: A&Ox3, no distress  Eyes: lids and conjunctivae normal, no icterus. Pupils equally round and reactive to light.  Neck: supple without mass or thyromegaly Chest: respiratory effort is normal. No  crepitus or tenderness on palpation of the chest. Breath sounds equal.  Cardiovascular: RRR with palpable distal pulses, no pedal edema Gastrointestinal: soft, nondistended, nontender. No mass, hepatomegaly or splenomegaly. No hernia. Lymphatic: no lymphadenopathy in the neck or groin Muscoloskeletal: no clubbing or cyanosis of the fingers.  Strength is symmetrical throughout.  Range of motion of bilateral upper and lower extremities normal without pain, crepitation or contracture. Neuro: cranial nerves grossly intact.  Sensation intact to light touch diffusely. Psych: appropriate mood and affect, normal insight/judgment intact  Skin: warm and dry      Latest Ref Rng & Units 09/21/2024    7:20 AM 09/20/2024    7:36 PM 01/17/2024    8:57 AM  CBC  WBC 4.0 - 10.5 K/uL 8.0  10.9  7.9   Hemoglobin 13.0 - 17.0  g/dL 87.2  86.5  86.5   Hematocrit 39.0 - 52.0 % 37.9  39.7  41.2   Platelets 150 - 400 K/uL 184  222  219        Latest Ref Rng & Units 09/21/2024    7:20 AM 09/20/2024    7:36 PM 01/17/2024    8:57 AM  CMP  Glucose 70 - 99 mg/dL 83  881  897   BUN 8 - 23 mg/dL 12  16  17    Creatinine 0.61 - 1.24 mg/dL 8.92  8.82  9.01   Sodium 135 - 145 mmol/L 133  130  133   Potassium 3.5 - 5.1 mmol/L 3.9  4.0  3.9   Chloride 98 - 111 mmol/L 98  94  100   CO2 22 - 32 mmol/L 23  26  22    Calcium 8.9 - 10.3 mg/dL 8.5  9.7  9.1   Total Protein 6.5 - 8.1 g/dL 6.5  7.8    Total Bilirubin 0.0 - 1.2 mg/dL 4.1  3.6    Alkaline Phos 38 - 126 U/L 149  192    AST 15 - 41 U/L 126  220    ALT 0 - 44 U/L 265  399      Lab Results  Component Value Date   INR 1.0 05/17/2019   INR 1.05 12/16/2013    Imaging: CT ABDOMEN PELVIS W CONTRAST Result Date: 09/20/2024 EXAM: CT ABDOMEN AND PELVIS WITH CONTRAST 09/20/2024 09:23:03 PM TECHNIQUE: CT of the abdomen and pelvis was performed with the administration of 100 mL of iohexol  (OMNIPAQUE ) 300 MG/ML solution. Multiplanar reformatted images are provided  for review. Automated exposure control, iterative reconstruction, and/or weight-based adjustment of the mA/kV was utilized to reduce the radiation dose to as low as reasonably achievable. COMPARISON: Comparison with 03/26/2005. CLINICAL HISTORY: Bowel obstruction suspected; upper abd pain, constipation. FINDINGS: LOWER CHEST: Small right pleural effusion with basilar atelectasis or consolidation. Small esophageal hiatal hernia. LIVER: Circumscribed cyst in the left lobe of the liver. No imaging follow-up is indicated. GALLBLADDER AND BILE DUCTS: Cholelithiasis with several stones in the gallbladder. Mild gallbladder wall thickening and pericholecystic edema. Changes may indicate acute cholecystitis in the appropriate clinical setting. There is a common duct stone in the distal common bile duct with mild bile duct dilatation. SPLEEN: Spleen is normal. PANCREAS: Infiltration and edema around the pancreas consistent with acute pancreatitis. No pancreatic ductal dilatation. No loculated collections. ADRENAL GLANDS: Adrenal glands are normal. KIDNEYS, URETERS AND BLADDER: The kidneys are normal. No stones in the kidneys or ureters. No hydronephrosis. No perinephric or periureteral stranding. Bladder is normal. GI AND BOWEL: Stomach demonstrates no acute abnormality. There is no bowel obstruction. PERITONEUM AND RETROPERITONEUM: No ascites. No free air. VASCULATURE: Calcification of the aorta. No aneurysm. LYMPH NODES: No significant lymphadenopathy. REPRODUCTIVE ORGANS: Prostate gland is markedly enlarged, measuring 7 cm in diameter. BONES AND SOFT TISSUES: Degenerative changes in the spine. Small right inguinal hernia containing fat. No acute osseous abnormality. No focal soft tissue abnormality. IMPRESSION: 1. Findings consistent with acute pancreatitis without pancreatic ductal dilatation or loculated collections. 2. Cholelithiasis with several gallbladder stones, mild gallbladder wall thickening, and pericholecystic  edema, suggestive of acute cholecystitis, with a distal common bile duct stone and mild biliary dilatation. 3. Small right pleural effusion with basilar atelectasis or consolidation. 4. Small right inguinal hernia containing fat. Electronically signed by: Elsie Gravely MD 09/20/2024 09:31 PM EST RP Workstation: [id number]     A/P:  Gallstone pancreatitis with associated choledocholithiasis and possible cholecystitis - Symptomatically improved this morning.  I do not think he has active cholecystitis given he has no abdominal tenderness or ongoing pain. - Total bilirubin has gone up slightly to 4.1, but alk phos/AST/ALT have downtrended slightly.  Initial lipase greater than 2800, has not been rechecked.  No leukocytosis.  No fever.  -Continue to hold anticoagulation, continue fluid resuscitation and bowel rest - Await further recommendations from GI team regarding possible ERCP given finding of choledocholithiasis on CT scan.  Will recheck lipase and LFTs tomorrow. - Anticipate laparoscopic cholecystectomy this admission pending clinical course.  I discussed the surgery with him including relevant anatomy, surgical technique, and risks including bleeding, infection, pain, scarring, injury to intra-abdominal structures specifically the common bile duct and sequelae, bile leak, subtotal cholecystectomy, as well as cardiovascular/pulmonary/thromboembolic complications.  Questions were welcomed and answered to his satisfaction.  We will continue to follow.   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

## 2024-09-21 NOTE — Assessment & Plan Note (Addendum)
 Patient had gentle IV fluids pre operative, at the time of his discharge his serum cr is 0,89 with K at 3,8 and serum bicarbonate at 22 Na 133 and Mg 2,9   Plan to follow up renal function and electrolytes as outpatient.

## 2024-09-21 NOTE — Hospital Course (Addendum)
 Mr. Lance Hubbard was admitted to the hospital with the working diagnosis of acute gallstone pancreatitis.   83 yo male with the past medical history of hypertension, CML, hypothyroidism, hyperlipidemia and atrial fibrillation who presented with abdominal pain, nausea and vomiting. Reported intermittent epigastric abdominal pain for the last 6 weeks, but this time pain was more persistent, severe and associated with nausea and vomiting, prompting him to come to the ed.  On his initial physical examination his blood pressure was 148/75, HR 73, RR 18 and 02 saturation 100% on room air Lungs with no wheezing or rhonchi, heart with S1 and S2 present and regular, abdomen with tenderness to palpation at the epigastrium with no rebound or guarding, no lower extremity edema.   Na 130, K 4.0 Cl 94 bicarbonate 26 glucose 118, bun 16 cr 1,17  ALK P 192, lipase > 2,800  AST 220 ALT 399 total bilirubin 3,6  Wbc 10,9 hgb 13.5 plt 222  Urine analysis SG 1,025, protein trace, negative glucose, negative leukocytes, negative hgb.   EKG 82 bpm, normal axis, normal intervals, qtc 450, sinus rhythm with PAC and PVH, with no significant ST segment or T wave changes.   CT abdomen and pelvis with acute pancreatitis without pancreatic ductal dilatation or loculated collections.  Cholelithiasis with several gallbladder stone, mild gallbladder wall thickening and pericholecystic edema, suggesting acute cholecystitis, with a distal common bile duct stone and mild bilary dilatation.  Small right pleural effusion.  Small right inguinal hernia containing fat.   Patient placed on IV fluids and IV antibiotic therapy NPO  Consulted GI for possible ERCP 11/28 patient with improved abdominal pain, liver enzymes and bilirubins improving.  Laparoscopic cholecystectomy  11/29 resuming apixaban . No clinical signs of choledocholithiasis, possible stone had pass. Follow up abdominal US  with common bile duct diameter of 3 mm.  11/30 patient  clinically stable for discharge home.

## 2024-09-21 NOTE — Progress Notes (Signed)
*  PRELIMINARY RESULTS* Echocardiogram Limited Echocardiogram has been performed.  [name] 09/21/2024, 2:02 PM

## 2024-09-21 NOTE — Assessment & Plan Note (Addendum)
 Patient on sinus rhythm with frequent PVC and PAC.  He is not on AV blockade at home  Patient had apixaban  held pre op, with good toleration. Now anticoagulation has been resumed.  Plan to continue anticoagulation at home.

## 2024-09-21 NOTE — Consult Note (Signed)
 Referring Provider: Dr. Elidia Furnace  Primary Care Physician:  Frederik Charleston, MD Primary Gastroenterologist:  Dr. Abran.  Reason for Consultation: Gallstone pancreatitis, choledocholithiasis  HPI: Lance Hubbard is a 83 y.o. male with a past medical history of arthritis, hypertension, hyperlipidemia, atrial fibrillation s/p  cardioversion on Eliquis , kidney stones, hypothyroidism and CML.  Patient presented to [facility] ED 09/20/2024 with nausea, vomiting and abdominal pain.  Labs in the ED showed a WBC count of 10.9.  Hemoglobin 13.4.  Platelets 222.  Sodium 130.  Potassium 4.0.  BUN 16.  Creatinine 1.17.  Total bili 3.6.  Alk phos 192.  AST 220.  ALT 399.  Lipase > 2,800.  Urine bilirubin level positive. CTAP identified acute pancreatitis, cholelithiasis with several gallstones, sonographic evidence of acute cholecystitis and a distal CBD stone with mild biliary dilatation. Patient was transferred to [facility] for further GI evaluation and ERCP.  On IV Ceftriaxone  and Flagyl .  Labs today: WBC 8.0.  Hemoglobin 12.7.  Hematocrit 37.9.  Platelets 184.  Sodium 133.  Total bili 4.1.  Alk phos 149.  AST 126.  ALT 265.  A GI consult was requested for further management of acute gallstone pancreatitis and choledocholithiasis.  He endorsed having an episode of feeling really bad with nausea and vomiting with central upper abdominal pain during the night which occurred 3 months ago.  The next morning he felt well.  He had a similar attack of nausea/vomiting and abdominal pain 1 month later which radiated to his right shoulder and back.  He took Tums with mild relief in his symptoms abated later that day.  He had similar nausea, vomiting and central upper abdominal pain on Tuesday 11/25 around dinnertime which progressively worsened therefore he presented to the ED 1/26 as noted above.  His abdominal pain is well-controlled at this time.  Last received Morphine  at 2 AM.  No further nausea or  vomiting.  Last BM was 2 days ago, described as normal brown.  No bloody or black stools.  No prior history of pancreatitis.  Denies having any GERD symptoms or dysphagia.  Non-smoker.  No alcohol use.  History of A-fib secondary to history of atrial fibrillation, last dose was taken Wednesday a.m. 11/26.  He was seen by general surgery, waiting further GI recommendations regarding possible ERCP prior to pursuing anticipated laparoscopic cholecystectomy during this admission.  GI PROCEDURES:  Colonoscopy 07/10/2008: 2 polyps measuring 1 to 2 mm removed from the colon Sigmoid diverticulosis Internal hemorrhoids Repeat colonoscopy in 5 years  Past Medical History:  Diagnosis Date   Arthritis    CML (chronic myelocytic leukemia) (HCC)    in remission   History of kidney stones    Hypertension    Persistent atrial fibrillation (HCC)    Atrial Fibrillation    Past Surgical History:  Procedure Laterality Date   ATRIAL FIBRILLATION ABLATION N/A 05/29/2021   Procedure: ATRIAL FIBRILLATION ABLATION;  Surgeon: Kelsie Agent, MD;  Location: [facility] INVASIVE CV LAB;  Service: Cardiovascular;  Laterality: N/A;   CARDIOVERSION N/A 12/18/2013   Procedure: CARDIOVERSION;  Surgeon: Salena GORMAN Negri, MD;  Location: [facility] ENDOSCOPY;  Service: Cardiovascular;  Laterality: N/A;  9:58 Lido 40 mg, IV Propofol  40 mg, IV for anesthesia,  synched cardioversio @ 120 joules with successful NSR    CARDIOVERSION N/A 01/18/2024   Procedure: CARDIOVERSION;  Surgeon: Jeffrie Oneil BROCKS, MD;  Location: [facility] INVASIVE CV LAB;  Service: Cardiovascular;  Laterality: N/A;   COLONOSCOPY  KNEE ARTHROSCOPY     knee dsurgery     LEFT HEART CATHETERIZATION WITH CORONARY ANGIOGRAM N/A 12/19/2013   Procedure: LEFT HEART CATHETERIZATION WITH CORONARY ANGIOGRAM;  Surgeon: Rober LOISE Chroman, MD;  Location: [facility] CATH LAB;  Service: Cardiovascular;  Laterality: N/A;   LUMBAR LAMINECTOMY/DECOMPRESSION MICRODISCECTOMY N/A 05/17/2019   Procedure: Lumbar  Four Lumbar Five Lumbar Laminectomy;  Surgeon: Alix Charleston, MD;  Location: [facility] OR;  Service: Neurosurgery;  Laterality: N/A;  Lumbar 4 Lumbar 5 Lumbar Laminectomy   TEE WITHOUT CARDIOVERSION N/A 12/18/2013   Procedure: TRANSESOPHAGEAL ECHOCARDIOGRAM (TEE);  Surgeon: Salena GORMAN Negri, MD;  Location: [facility] ENDOSCOPY;  Service: Cardiovascular;  Laterality: N/A;    Prior to Admission medications   Medication Sig Start Date End Date Taking? Authorizing Provider  acetaminophen  (TYLENOL ) 500 MG tablet Take 1,000 mg by mouth every 6 (six) hours as needed for mild pain (pain score 1-3) or moderate pain (pain score 4-6).   Yes [provider]  apixaban  (ELIQUIS ) 5 MG TABS tablet Take 1 tablet (5 mg total) by mouth every 12 (twelve) hours. 12/20/13  Yes Chroman Rober, MD  bosutinib (BOSULIF) 400 MG tablet Take 400 mg by mouth every evening.   Yes [provider]  clobetasol  cream (TEMOVATE ) 0.05 % Apply 1 application topically 2 (two) times daily as needed (rash).   Yes [provider]  levothyroxine  (SYNTHROID ) 25 MCG tablet Take 25 mcg by mouth daily before breakfast. 03/19/21  Yes [provider]  rosuvastatin (CRESTOR) 5 MG tablet Take 5 mg by mouth at bedtime. Patient taking differently: Take 5 mg by mouth every other day.   Yes [provider]  sacubitril-valsartan (ENTRESTO ) 49-51 MG Take 0.5 tablets by mouth 2 (two) times daily.   Yes [provider]    Current Facility-Administered Medications  Medication Dose Route Frequency Provider Last Rate Last Admin   0.9 %  sodium chloride  infusion   Intravenous Continuous Dorinda Drue DASEN, MD 100 mL/hr at 09/21/24 0924 New Bag at 09/21/24 0924   cefTRIAXone  (ROCEPHIN ) 2 g in sodium chloride  0.9 % 100 mL IVPB  2 g Intravenous Daily Dorinda Drue T, MD 200 mL/hr at 09/21/24 0926 2 g at 09/21/24 9073   hydrALAZINE  (APRESOLINE ) injection 10 mg  10 mg Intravenous Q6H PRN Dorinda Drue DASEN, MD       HYDROmorphone   (DILAUDID ) injection 1 mg  1 mg Intravenous Q3H PRN Dorinda Drue DASEN, MD       levothyroxine  (SYNTHROID ) tablet 25 mcg  25 mcg Oral Q0600 Dorinda Drue DASEN, MD   25 mcg at 09/21/24 0703   metroNIDAZOLE  (FLAGYL ) IVPB 500 mg  500 mg Intravenous BID Dorinda Drue T, MD 100 mL/hr at 09/21/24 0707 500 mg at 09/21/24 9292   ondansetron  (ZOFRAN ) injection 4 mg  4 mg Intravenous Q6H PRN Dorinda Drue DASEN, MD        Allergies as of 09/20/2024 - Review Complete 09/20/2024  Allergen Reaction Noted   Amoxicillin Hives and Other (See Comments) 06/27/2008   Penicillin g  01/22/2021    Family History  Problem Relation Age of Onset   Heart disease Father    Hypertension Sister     Social History   Socioeconomic History   Marital status: Married    Spouse name: Not on file   Number of children: Not on file   Years of education: Not on file   Highest education level: Not on file  Occupational History   Not on file  Tobacco  Use   Smoking status: Former    Types: Pipe    Quit date: 11/08/2018    Years since quitting: 5.8   Smokeless tobacco: Never   Tobacco comments:    Former smoker 06/26/2021  Vaping Use   Vaping status: Never Used  Substance and Sexual Activity   Alcohol use: Not Currently    Comment: stopped around 2012   Drug use: No   Sexual activity: Not on file  Other Topics Concern   Not on file  Social History Narrative   Lives in [place] with spouse.   Retired art gallery manager in [employer]   Social Drivers of Corporate Investment Banker Strain: Not on [employer] Insecurity: Low Risk  (05/19/2024)   Received from [facility]   Hunger Vital Sign    Within the past 12 months, you worried that your food would run out before you got money to buy more: Not on file    Within the past 12 months, the food you bought just didn't last and you didn't have money to get more. : Never true  Transportation Needs: No Transportation Needs (05/19/2024)   Received from [facility]    In the past 12 months, has lack of reliable transportation kept you from medical appointments, meetings, work or from getting things needed for daily living? : No  Physical Activity: Not on file  Stress: Not on file  Social Connections: Not on file  Intimate Partner Violence: Not on file   Review of Systems: Gen: Denies fever, sweats or chills. No weight loss.  CV: Denies chest pain, palpitations or edema. Resp: Denies cough, shortness of breath of hemoptysis.  GI: See HPI.  GU: Urine is darker yellow. MS: Denies joint pain, muscles aches or weakness. Derm: Denies rash, itchiness, skin lesions or unhealing ulcers. Psych: Denies depression, anxiety, memory loss or confusion. Heme: Denies easy bruising, bleeding. Neuro:  Denies headaches, dizziness or paresthesias. Endo:  Denies any problems with DM, thyroid  or adrenal function.  Physical Exam: Vital signs in last 24 hours: Temp:  [97.8 F (36.6 C)-98.8 F (37.1 C)] 97.9 F (36.6 C) (11/27 0757) Pulse Rate:  [40-73] 61 (11/27 0557) Resp:  [15-22] 19 (11/27 0757) BP: (109-150)/(56-82) 113/65 (11/27 0557) SpO2:  [95 %-100 %] 97 % (11/27 0557) Weight:  [99.8 kg] 99.8 kg (11/26 1933)   General: Alert 83 year old male in no acute distress. Head:  Normocephalic and atraumatic. Eyes:  No scleral icterus. Conjunctiva pink. Ears:  Normal auditory acuity. Nose:  No deformity, discharge or lesions. Mouth: Upper partial plate.  No ulcers or lesions.  Neck:  Supple. No lymphadenopathy or thyromegaly.  Lungs: Breath sounds clear throughout. No wheezes, rhonchi or crackles.  Heart: Regular rhythm, monitor showing frequent PVCs.  No murmur. Abdomen: Protuberant.  Distended.  Soft.  Nontender.  Positive bowel sounds to all 4 quadrants.  No palpable mass. Rectal: Deferred. Musculoskeletal:  Symmetrical without gross deformities.  Pulses:  Normal pulses noted. Extremities:  Without clubbing or edema. Neurologic:  Alert and   oriented x 4. No focal deficits.  Skin:  Intact without significant lesions or rashes. Psych:  Alert and cooperative. Normal mood and affect.  Intake/Output from previous day: 11/26 0701 - 11/27 0700 In: 298.7 [IV Piggyback:298.7] Out: -  Intake/Output this shift: Total I/O In: -  Out: 200 [Urine:200]  Lab Results: Recent Labs    09/20/24 1936 09/21/24 0720  WBC 10.9* 8.0  HGB 13.4 12.7*  HCT  39.7 37.9*  PLT 222 184   BMET Recent Labs    09/20/24 1936 09/21/24 0720  NA 130* 133*  K 4.0 3.9  CL 94* 98  CO2 26 23  GLUCOSE 118* 83  BUN 16 12  CREATININE 1.17 1.07  CALCIUM 9.7 8.5*   LFT Recent Labs    09/21/24 0720  PROT 6.5  ALBUMIN 3.6  AST 126*  ALT 265*  ALKPHOS 149*  BILITOT 4.1*   PT/INR No results for input(s): LABPROT, INR in the last 72 hours. Hepatitis Panel No results for input(s): HEPBSAG, HCVAB, HEPAIGM, HEPBIGM in the last 72 hours.    Studies/Results: CT ABDOMEN PELVIS W CONTRAST Result Date: 09/20/2024 EXAM: CT ABDOMEN AND PELVIS WITH CONTRAST 09/20/2024 09:23:03 PM TECHNIQUE: CT of the abdomen and pelvis was performed with the administration of 100 mL of iohexol  (OMNIPAQUE ) 300 MG/ML solution. Multiplanar reformatted images are provided for review. Automated exposure control, iterative reconstruction, and/or weight-based adjustment of the mA/kV was utilized to reduce the radiation dose to as low as reasonably achievable. COMPARISON: Comparison with 03/26/2005. CLINICAL HISTORY: Bowel obstruction suspected; upper abd pain, constipation. FINDINGS: LOWER CHEST: Small right pleural effusion with basilar atelectasis or consolidation. Small esophageal hiatal hernia. LIVER: Circumscribed cyst in the left lobe of the liver. No imaging follow-up is indicated. GALLBLADDER AND BILE DUCTS: Cholelithiasis with several stones in the gallbladder. Mild gallbladder wall thickening and pericholecystic edema. Changes may indicate acute cholecystitis in  the appropriate clinical setting. There is a common duct stone in the distal common bile duct with mild bile duct dilatation. SPLEEN: Spleen is normal. PANCREAS: Infiltration and edema around the pancreas consistent with acute pancreatitis. No pancreatic ductal dilatation. No loculated collections. ADRENAL GLANDS: Adrenal glands are normal. KIDNEYS, URETERS AND BLADDER: The kidneys are normal. No stones in the kidneys or ureters. No hydronephrosis. No perinephric or periureteral stranding. Bladder is normal. GI AND BOWEL: Stomach demonstrates no acute abnormality. There is no bowel obstruction. PERITONEUM AND RETROPERITONEUM: No ascites. No free air. VASCULATURE: Calcification of the aorta. No aneurysm. LYMPH NODES: No significant lymphadenopathy. REPRODUCTIVE ORGANS: Prostate gland is markedly enlarged, measuring 7 cm in diameter. BONES AND SOFT TISSUES: Degenerative changes in the spine. Small right inguinal hernia containing fat. No acute osseous abnormality. No focal soft tissue abnormality. IMPRESSION: 1. Findings consistent with acute pancreatitis without pancreatic ductal dilatation or loculated collections. 2. Cholelithiasis with several gallbladder stones, mild gallbladder wall thickening, and pericholecystic edema, suggestive of acute cholecystitis, with a distal common bile duct stone and mild biliary dilatation. 3. Small right pleural effusion with basilar atelectasis or consolidation. 4. Small right inguinal hernia containing fat. Electronically signed by: Elsie Gravely MD 09/20/2024 09:31 PM EST RP Workstation: [id number]    IMPRESSION/PLAN:  83 year old male admitted with N/V and central upper abdominal pain secondary to gallstone pancreatitis and choledocholithiasis.  Lipase > 2,8000. Elevated LFTs. CTAP identified acute pancreatitis, cholelithiasis with several gallstones, sonographic evidence of acute cholecystitis and a distal CBD stone with mild biliary dilatation. On IV antibiotics.   Afebrile.  Hemodynamically stable. - Clear liquid diet  - ERCP benefits and risks discussed including risk with sedation, risk of bleeding, perforation, infection and infection.  Timing to be determined.  Await further recommendations per Dr. Abran. - Continue ceftriaxone  and Flagyl  IV - Continue ondansetron  4 mg IV every 6 hours as needed - Continue pantoprazole  40 mg p.o. daily - CBC, CMP and lipase level in a.m. - IV fluids and pain management per the hospitalist - Patient  seen by general surgery, patient will likely require cholecystectomy during this hospital admission -Continue to hold Eliquis  - Await further recommendations per Dr. Leigh  Atrial fibrillation the last dose of Eliquis  was 11/20 6 AM. CHA2DS2-VASc Score = 3.  CML on Bosutinib - Continue follow-up with oncology  Elida CHRISTELLA Shawl  09/21/2024, 11:34 AM

## 2024-09-21 NOTE — Assessment & Plan Note (Addendum)
 Cell count stable, plan to follow up as outpatient.  Resume bosutinib.

## 2024-09-22 ENCOUNTER — Encounter (HOSPITAL_COMMUNITY): Payer: Self-pay | Admitting: Internal Medicine

## 2024-09-22 ENCOUNTER — Encounter (HOSPITAL_COMMUNITY)
Admission: EM | Disposition: A | Discharge status: Home / Self Care | Payer: Self-pay | Source: Home / Self Care | Attending: Internal Medicine

## 2024-09-22 ENCOUNTER — Inpatient Hospital Stay (HOSPITAL_COMMUNITY): Admitting: Anesthesiology

## 2024-09-22 ENCOUNTER — Inpatient Hospital Stay (HOSPITAL_COMMUNITY)

## 2024-09-22 DIAGNOSIS — I5022 Chronic systolic (congestive) heart failure: Secondary | ICD-10-CM

## 2024-09-22 DIAGNOSIS — I10 Essential (primary) hypertension: Secondary | ICD-10-CM | POA: Diagnosis not present

## 2024-09-22 DIAGNOSIS — E871 Hypo-osmolality and hyponatremia: Secondary | ICD-10-CM

## 2024-09-22 DIAGNOSIS — I159 Secondary hypertension, unspecified: Secondary | ICD-10-CM | POA: Diagnosis not present

## 2024-09-22 DIAGNOSIS — Z87891 Personal history of nicotine dependence: Secondary | ICD-10-CM | POA: Diagnosis not present

## 2024-09-22 DIAGNOSIS — I4891 Unspecified atrial fibrillation: Secondary | ICD-10-CM

## 2024-09-22 DIAGNOSIS — K819 Cholecystitis, unspecified: Secondary | ICD-10-CM

## 2024-09-22 DIAGNOSIS — K851 Biliary acute pancreatitis without necrosis or infection: Secondary | ICD-10-CM | POA: Diagnosis not present

## 2024-09-22 DIAGNOSIS — Z7901 Long term (current) use of anticoagulants: Secondary | ICD-10-CM

## 2024-09-22 DIAGNOSIS — C9210 Chronic myeloid leukemia, BCR/ABL-positive, not having achieved remission: Secondary | ICD-10-CM

## 2024-09-22 DIAGNOSIS — I480 Paroxysmal atrial fibrillation: Secondary | ICD-10-CM

## 2024-09-22 DIAGNOSIS — R748 Abnormal levels of other serum enzymes: Secondary | ICD-10-CM

## 2024-09-22 DIAGNOSIS — K8050 Calculus of bile duct without cholangitis or cholecystitis without obstruction: Secondary | ICD-10-CM

## 2024-09-22 HISTORY — PX: CHOLECYSTECTOMY: SHX55

## 2024-09-22 LAB — COMPREHENSIVE METABOLIC PANEL WITH GFR
ALT: 196 U/L — ABNORMAL HIGH (ref 0–44)
AST: 72 U/L — ABNORMAL HIGH (ref 15–41)
Albumin: 3.3 g/dL — ABNORMAL LOW (ref 3.5–5.0)
Alkaline Phosphatase: 136 U/L — ABNORMAL HIGH (ref 38–126)
Anion gap: 12 (ref 5–15)
BUN: 13 mg/dL (ref 8–23)
CO2: 21 mmol/L — ABNORMAL LOW (ref 22–32)
Calcium: 8.3 mg/dL — ABNORMAL LOW (ref 8.9–10.3)
Chloride: 100 mmol/L (ref 98–111)
Creatinine, Ser: 0.9 mg/dL (ref 0.61–1.24)
GFR, Estimated: >60 mL/min (ref >60)
Glucose, Bld: 76 mg/dL (ref 70–99)
Potassium: 3.7 mmol/L (ref 3.5–5.1)
Sodium: 133 mmol/L — ABNORMAL LOW (ref 135–145)
Total Bilirubin: 1.5 mg/dL — ABNORMAL HIGH (ref 0.0–1.2)
Total Protein: 6 g/dL — ABNORMAL LOW (ref 6.5–8.1)

## 2024-09-22 LAB — CBC
HCT: 35 % — ABNORMAL LOW (ref 39.0–52.0)
Hemoglobin: 11.8 g/dL — ABNORMAL LOW (ref 13.0–17.0)
MCH: 29.1 pg (ref 26.0–34.0)
MCHC: 33.7 g/dL (ref 30.0–36.0)
MCV: 86.4 fL (ref 80.0–100.0)
Platelets: 186 K/uL (ref 150–400)
RBC: 4.05 MIL/uL — ABNORMAL LOW (ref 4.22–5.81)
RDW: 13.6 % (ref 11.5–15.5)
WBC: 11.2 K/uL — ABNORMAL HIGH (ref 4.0–10.5)
nRBC: 0 % (ref 0.0–0.2)

## 2024-09-22 LAB — PROTIME-INR
INR: 1.3 — ABNORMAL HIGH (ref 0.8–1.2)
Prothrombin Time: 16.7 s — ABNORMAL HIGH (ref 11.4–15.2)

## 2024-09-22 LAB — MAGNESIUM: Magnesium: 2 mg/dL (ref 1.7–2.4)

## 2024-09-22 LAB — SURGICAL PCR SCREEN
MRSA, PCR: NEGATIVE
Staphylococcus aureus: NEGATIVE

## 2024-09-22 LAB — LIPASE, BLOOD: Lipase: 133 U/L — ABNORMAL HIGH (ref 11–51)

## 2024-09-22 MED ORDER — IOHEXOL 300 MG/ML  SOLN: INTRAMUSCULAR | Status: DC | PRN

## 2024-09-22 MED ORDER — ENOXAPARIN SODIUM 40 MG/0.4ML IJ SOSY
40.0000 mg | PREFILLED_SYRINGE | INTRAMUSCULAR | Status: DC
  Administered 2024-09-23: 40 mg via SUBCUTANEOUS
  Filled 2024-09-22: qty 0.4

## 2024-09-22 MED ORDER — LIDOCAINE 2% (20 MG/ML) 5 ML SYRINGE
INTRAMUSCULAR | Status: AC
Start: 2024-09-22 — End: 2024-09-22
  Filled 2024-09-22: qty 5

## 2024-09-22 MED ORDER — LACTATED RINGERS IV SOLN: INTRAVENOUS | Status: DC

## 2024-09-22 MED ORDER — OXYCODONE HCL 5 MG/5ML PO SOLN: 5.0000 mg | Freq: Once | ORAL | Status: DC | PRN

## 2024-09-22 MED ORDER — ROCURONIUM BROMIDE 10 MG/ML (PF) SYRINGE
PREFILLED_SYRINGE | INTRAVENOUS | Status: AC
  Filled 2024-09-22: qty 10

## 2024-09-22 MED ORDER — SUGAMMADEX SODIUM 200 MG/2ML IV SOLN
INTRAVENOUS | Status: DC | PRN
  Administered 2024-09-22: 200 mg via INTRAVENOUS

## 2024-09-22 MED ORDER — SUCCINYLCHOLINE CHLORIDE 200 MG/10ML IV SOSY
PREFILLED_SYRINGE | INTRAVENOUS | Status: DC | PRN
  Administered 2024-09-22: 120 mg via INTRAVENOUS

## 2024-09-22 MED ORDER — FENTANYL CITRATE (PF) 250 MCG/5ML IJ SOLN
INTRAMUSCULAR | Status: AC
  Filled 2024-09-22: qty 5

## 2024-09-22 MED ORDER — ONDANSETRON HCL 4 MG/2ML IJ SOLN
INTRAMUSCULAR | Status: DC | PRN
  Administered 2024-09-22: 4 mg via INTRAVENOUS

## 2024-09-22 MED ORDER — LIDOCAINE 2% (20 MG/ML) 5 ML SYRINGE
INTRAMUSCULAR | Status: DC | PRN
  Administered 2024-09-22: 60 mg via INTRAVENOUS

## 2024-09-22 MED ORDER — HYDROMORPHONE HCL 1 MG/ML IJ SOLN
0.2500 mg | INTRAMUSCULAR | Status: DC | PRN
  Administered 2024-09-22 (×2): 0.5 mg via INTRAVENOUS

## 2024-09-22 MED ORDER — AMISULPRIDE (ANTIEMETIC) 5 MG/2ML IV SOLN
10.0000 mg | Freq: Once | INTRAVENOUS | Status: DC | PRN
Start: 2024-09-22 — End: 2024-09-22

## 2024-09-22 MED ORDER — OXYCODONE HCL 5 MG PO TABS: 5.0000 mg | ORAL_TABLET | Freq: Once | ORAL | Status: DC | PRN

## 2024-09-22 MED ORDER — FENTANYL CITRATE (PF) 250 MCG/5ML IJ SOLN
INTRAMUSCULAR | Status: DC | PRN
  Administered 2024-09-22 (×5): 50 ug via INTRAVENOUS

## 2024-09-22 MED ORDER — ORAL CARE MOUTH RINSE: 15.0000 mL | Freq: Once | OROMUCOSAL | Status: AC

## 2024-09-22 MED ORDER — BUPIVACAINE-EPINEPHRINE 0.25% -1:200000 IJ SOLN
INTRAMUSCULAR | Status: DC | PRN
  Administered 2024-09-22: 10 mL

## 2024-09-22 MED ORDER — CHLORHEXIDINE GLUCONATE 0.12 % MT SOLN
15.0000 mL | Freq: Once | OROMUCOSAL | Status: AC
  Administered 2024-09-22: 15 mL via OROMUCOSAL
  Filled 2024-09-22: qty 15

## 2024-09-22 MED ORDER — 0.9 % SODIUM CHLORIDE (POUR BTL) OPTIME
TOPICAL | Status: DC | PRN
  Administered 2024-09-22: 1000 mL

## 2024-09-22 MED ORDER — HYDROMORPHONE HCL 1 MG/ML IJ SOLN
INTRAMUSCULAR | Status: AC
  Filled 2024-09-22: qty 1

## 2024-09-22 MED ORDER — POTASSIUM CHLORIDE CRYS ER 20 MEQ PO TBCR: 40.0000 meq | EXTENDED_RELEASE_TABLET | Freq: Once | ORAL | Status: DC

## 2024-09-22 MED ORDER — POTASSIUM CHLORIDE 10 MEQ/100ML IV SOLN
10.0000 meq | INTRAVENOUS | Status: AC
  Administered 2024-09-22 (×2): 10 meq via INTRAVENOUS
  Filled 2024-09-22 (×2): qty 100

## 2024-09-22 MED ORDER — SODIUM CHLORIDE 0.9 % IR SOLN
Status: DC | PRN
  Administered 2024-09-22: 1000 mL

## 2024-09-22 MED ORDER — POTASSIUM CHLORIDE 10 MEQ/100ML IV SOLN
10.0000 meq | INTRAVENOUS | Status: AC
Start: 2024-09-22 — End: 2024-09-22
  Administered 2024-09-22 (×2): 10 meq via INTRAVENOUS
  Filled 2024-09-22 (×2): qty 100

## 2024-09-22 MED ORDER — BUPIVACAINE-EPINEPHRINE (PF) 0.25% -1:200000 IJ SOLN
INTRAMUSCULAR | Status: AC
  Filled 2024-09-22: qty 30

## 2024-09-22 MED ORDER — ROCURONIUM BROMIDE 10 MG/ML (PF) SYRINGE
PREFILLED_SYRINGE | INTRAVENOUS | Status: DC | PRN
  Administered 2024-09-22: 50 mg via INTRAVENOUS

## 2024-09-22 MED ORDER — PROPOFOL 10 MG/ML IV BOLUS
INTRAVENOUS | Status: DC | PRN
Start: 2024-09-22 — End: 2024-09-22
  Administered 2024-09-22: 100 mg via INTRAVENOUS

## 2024-09-22 MED ORDER — METHOCARBAMOL 500 MG PO TABS: 500.0000 mg | ORAL_TABLET | Freq: Four times a day (QID) | ORAL | Status: DC | PRN

## 2024-09-22 MED ORDER — OXYCODONE HCL 5 MG PO TABS: 5.0000 mg | ORAL_TABLET | ORAL | Status: DC | PRN

## 2024-09-22 MED ORDER — HEMOSTATIC AGENTS (NO CHARGE) OPTIME
TOPICAL | Status: DC | PRN
  Administered 2024-09-22: 1 via TOPICAL

## 2024-09-22 MED ORDER — PROPOFOL 10 MG/ML IV BOLUS
INTRAVENOUS | Status: AC
Start: 2024-09-22 — End: 2024-09-22
  Filled 2024-09-22: qty 20

## 2024-09-22 MED ORDER — ACETAMINOPHEN 325 MG PO TABS
650.0000 mg | ORAL_TABLET | Freq: Four times a day (QID) | ORAL | Status: DC
  Administered 2024-09-22 – 2024-09-24 (×9): 650 mg via ORAL
  Filled 2024-09-22 (×9): qty 2

## 2024-09-22 MED ORDER — ONDANSETRON HCL 4 MG/2ML IJ SOLN
INTRAMUSCULAR | Status: AC
  Filled 2024-09-22: qty 2

## 2024-09-22 SURGICAL SUPPLY — 2 items
NDL 22X1.5 STRL (OR ONLY) (MISCELLANEOUS) ×2 IMPLANT
NDL INSUFFLATION 14GA 120MM (NEEDLE) ×2 IMPLANT

## 2024-09-22 NOTE — Transfer of Care (Signed)
 Immediate Anesthesia Transfer of Care Note  Patient: Lance Hubbard  Procedure(s) Performed: LAPAROSCOPIC CHOLECYSTECTOMY (Abdomen)  Patient Location: PACU  Anesthesia Type:General  Level of Consciousness: drowsy and patient cooperative  Airway & Oxygen Therapy: Patient Spontanous Breathing  Post-op Assessment: Report given to RN and Post -op Vital signs reviewed and stable  Post vital signs: Reviewed and stable  Last Vitals:  Vitals Value Taken Time  BP 153/78 09/22/24 14:55  Temp 98.2 (Axillary)  09/22/24 14:57  Pulse 95 09/22/24 14:57  Resp 26 09/22/24 14:57  SpO2 93 % 09/22/24 14:57  Vitals shown include unfiled device data.  Last Pain:  Vitals:   09/22/24 1103  TempSrc:   PainSc: 0-No pain      Patients Stated Pain Goal: 3 (09/22/24 1103)  Complications: No notable events documented.

## 2024-09-22 NOTE — Plan of Care (Signed)
  Problem: Education: Goal: Knowledge of General Education information will improve Description: Including pain rating scale, medication(s)/side effects and non-pharmacologic comfort measures Outcome: Progressing   Problem: Health Behavior/Discharge Planning: Goal: Ability to manage health-related needs will improve Outcome: Progressing   Problem: Clinical Measurements: Goal: Ability to maintain clinical measurements within normal limits will improve Outcome: Progressing Goal: Respiratory complications will improve Outcome: Progressing Goal: Cardiovascular complication will be avoided Outcome: Progressing   Problem: Activity: Goal: Risk for activity intolerance will decrease Outcome: Progressing   Problem: Nutrition: Goal: Adequate nutrition will be maintained Outcome: Progressing   Problem: Coping: Goal: Level of anxiety will decrease Outcome: Progressing   Problem: Safety: Goal: Ability to remain free from injury will improve Outcome: Progressing

## 2024-09-22 NOTE — Progress Notes (Signed)
 * Day of Surgery *   Subjective/Chief Complaint: Denies complaints.    Objective: Vital signs in last 24 hours: Temp:  [98 F (36.7 C)-98.6 F (37 C)] (P) 98.3 F (36.8 C) (11/28 1053) Pulse Rate:  [62-82] (P) 82 (11/28 1053) Resp:  [16-18] (P) 18 (11/28 1053) BP: (113-147)/(51-80) (P) 142/81 (11/28 1053) SpO2:  [96 %-99 %] (P) 96 % (11/28 1053) Weight:  [99.8 kg] (P) 99.8 kg (11/28 1053) Last BM Date : 09/22/24  Intake/Output from previous day: 11/27 0701 - 11/28 0700 In: 552.5 [I.V.:352.5; IV Piggyback:200] Out: 200 [Urine:200] Intake/Output this shift: No intake/output data recorded.  Alert, well-appearing Unlabored respirations Abdomen is soft, nontender  Lab Results:  Recent Labs    09/21/24 0720 09/22/24 0528  WBC 8.0 11.2*  HGB 12.7* 11.8*  HCT 37.9* 35.0*  PLT 184 186   BMET Recent Labs    09/21/24 0720 09/22/24 0528  NA 133* 133*  K 3.9 3.7  CL 98 100  CO2 23 21*  GLUCOSE 83 76  BUN 12 13  CREATININE 1.07 0.90  CALCIUM 8.5* 8.3*   PT/INR Recent Labs    09/22/24 0528  LABPROT 16.7*  INR 1.3*   ABG No results for input(s): PHART, HCO3 in the last 72 hours.  Invalid input(s): PCO2, PO2  Studies/Results: ECHOCARDIOGRAM LIMITED Result Date: 09/21/2024    ECHOCARDIOGRAM LIMITED REPORT   Patient Name:   Lance Hubbard Date of Exam: 09/21/2024 Medical Rec #:  [id number]     Height:       74.0 in Accession #:    [id number]    Weight:       220.0 lb Date of Birth:  1941-04-21    BSA:          2.263 m Patient Age:    82 years      BP:           113/65 mmHg Patient Gender: M             HR:           61 bpm. Exam Location:  Inpatient Procedure: Limited Echo, Cardiac Doppler and Color Doppler (Both Spectral and            Color Flow Doppler were utilized during procedure). Indications:    Pre-operative cardiovascular examination Z01.810  History:        Patient has prior history of Echocardiogram examinations, most                 recent  09/26/2019. Arrythmias:Atrial Fibrillation; Risk                 Factors:Hypertension.  Sonographer:    Aida Pizza RCS Referring Phys: [id number] MAURICIO DANIEL ARRIEN IMPRESSIONS  1. Left ventricular ejection fraction, by estimation, is 30 to 35%. The left ventricle has moderately decreased function. The left ventricle demonstrates global hypokinesis. Left ventricular diastolic parameters are indeterminate. Elevated left ventricular end-diastolic pressure.  2. Right ventricular systolic function is normal. The right ventricular size is normal. There is severely elevated pulmonary artery systolic pressure. The estimated right ventricular systolic pressure is 65.1 mmHg.  3. Left atrial size was moderately dilated.  4. The mitral valve is normal in structure. Trivial mitral valve regurgitation. No evidence of mitral stenosis.  5. The aortic valve is normal in structure. There is mild calcification of the aortic valve. Aortic valve regurgitation is not visualized. Aortic valve sclerosis/calcification is present, without any evidence of aortic stenosis.  6.  The inferior vena cava is dilated in size with <50% respiratory variability, suggesting right atrial pressure of 15 mmHg. FINDINGS  Left Ventricle: Left ventricular ejection fraction, by estimation, is 30 to 35%. The left ventricle has moderately decreased function. The left ventricle demonstrates global hypokinesis. The left ventricular internal cavity size was normal in size. There is no left ventricular hypertrophy. Left ventricular diastolic parameters are indeterminate. Elevated left ventricular end-diastolic pressure. Right Ventricle: The right ventricular size is normal. No increase in right ventricular wall thickness. Right ventricular systolic function is normal. There is severely elevated pulmonary artery systolic pressure. The tricuspid regurgitant velocity is 3.54 m/s, and with an assumed right atrial pressure of 15 mmHg, the estimated right ventricular  systolic pressure is 65.1 mmHg. Left Atrium: Left atrial size was moderately dilated. Right Atrium: Right atrial size was normal in size. Pericardium: There is no evidence of pericardial effusion. Mitral Valve: The mitral valve is normal in structure. Trivial mitral valve regurgitation. No evidence of mitral valve stenosis. Tricuspid Valve: The tricuspid valve is normal in structure. Tricuspid valve regurgitation is mild . No evidence of tricuspid stenosis. Aortic Valve: The aortic valve is normal in structure. There is mild calcification of the aortic valve. Aortic valve regurgitation is not visualized. Aortic valve sclerosis/calcification is present, without any evidence of aortic stenosis. Pulmonic Valve: The pulmonic valve was normal in structure. Pulmonic valve regurgitation is trivial. No evidence of pulmonic stenosis. Aorta: The aortic root is normal in size and structure. Venous: The inferior vena cava is dilated in size with less than 50% respiratory variability, suggesting right atrial pressure of 15 mmHg. IAS/Shunts: No atrial level shunt detected by color flow Doppler. LEFT VENTRICLE PLAX 2D LVIDd:         6.20 cm LVIDs:         4.30 cm LV PW:         1.00 cm LV IVS:        1.00 cm LVOT diam:     2.30 cm LVOT Area:     4.15 cm  RIGHT VENTRICLE TAPSE (M-mode): 2.5 cm LEFT ATRIUM         Index LA diam:    5.40 cm 2.39 cm/m   AORTA Ao Root diam: 3.30 cm MITRAL VALVE                TRICUSPID VALVE MV Area (PHT): 4.60 cm     TR Peak grad:   50.1 mmHg MV Decel Time: 165 msec     TR Vmax:        354.00 cm/s MV E velocity: 114.00 cm/s MV A velocity: 54.80 cm/s   SHUNTS MV E/A ratio:  2.08         Systemic Diam: 2.30 cm Toribio Fuel MD Electronically signed by Toribio Fuel MD Signature Date/Time: 09/21/2024/2:11:02 PM    Final    CT ABDOMEN PELVIS W CONTRAST Result Date: 09/20/2024 EXAM: CT ABDOMEN AND PELVIS WITH CONTRAST 09/20/2024 09:23:03 PM TECHNIQUE: CT of the abdomen and pelvis was performed  with the administration of 100 mL of iohexol  (OMNIPAQUE ) 300 MG/ML solution. Multiplanar reformatted images are provided for review. Automated exposure control, iterative reconstruction, and/or weight-based adjustment of the mA/kV was utilized to reduce the radiation dose to as low as reasonably achievable. COMPARISON: Comparison with 03/26/2005. CLINICAL HISTORY: Bowel obstruction suspected; upper abd pain, constipation. FINDINGS: LOWER CHEST: Small right pleural effusion with basilar atelectasis or consolidation. Small esophageal hiatal hernia. LIVER: Circumscribed cyst in the left lobe  of the liver. No imaging follow-up is indicated. GALLBLADDER AND BILE DUCTS: Cholelithiasis with several stones in the gallbladder. Mild gallbladder wall thickening and pericholecystic edema. Changes may indicate acute cholecystitis in the appropriate clinical setting. There is a common duct stone in the distal common bile duct with mild bile duct dilatation. SPLEEN: Spleen is normal. PANCREAS: Infiltration and edema around the pancreas consistent with acute pancreatitis. No pancreatic ductal dilatation. No loculated collections. ADRENAL GLANDS: Adrenal glands are normal. KIDNEYS, URETERS AND BLADDER: The kidneys are normal. No stones in the kidneys or ureters. No hydronephrosis. No perinephric or periureteral stranding. Bladder is normal. GI AND BOWEL: Stomach demonstrates no acute abnormality. There is no bowel obstruction. PERITONEUM AND RETROPERITONEUM: No ascites. No free air. VASCULATURE: Calcification of the aorta. No aneurysm. LYMPH NODES: No significant lymphadenopathy. REPRODUCTIVE ORGANS: Prostate gland is markedly enlarged, measuring 7 cm in diameter. BONES AND SOFT TISSUES: Degenerative changes in the spine. Small right inguinal hernia containing fat. No acute osseous abnormality. No focal soft tissue abnormality. IMPRESSION: 1. Findings consistent with acute pancreatitis without pancreatic ductal dilatation or  loculated collections. 2. Cholelithiasis with several gallbladder stones, mild gallbladder wall thickening, and pericholecystic edema, suggestive of acute cholecystitis, with a distal common bile duct stone and mild biliary dilatation. 3. Small right pleural effusion with basilar atelectasis or consolidation. 4. Small right inguinal hernia containing fat. Electronically signed by: Elsie Gravely MD 09/20/2024 09:31 PM EST RP Workstation: [id number]    Anti-infectives: Anti-infectives (From admission, onward)    Start     Dose/Rate Route Frequency Ordered Stop   09/21/24 1000  [MAR Hold]  cefTRIAXone  (ROCEPHIN ) 2 g in sodium chloride  0.9 % 100 mL IVPB        (MAR Hold since Fri 09/22/2024 at 1053.Hold Reason: Transfer to a Procedural area)   2 g 200 mL/hr over 30 Minutes Intravenous Daily 09/21/24 0633     09/21/24 0645  [MAR Hold]  metroNIDAZOLE  (FLAGYL ) IVPB 500 mg        (MAR Hold since Fri 09/22/2024 at 1053.Hold Reason: Transfer to a Procedural area)   500 mg 100 mL/hr over 60 Minutes Intravenous 2 times daily 09/21/24 9366     09/20/24 2200  ciprofloxacin  (CIPRO ) IVPB 400 mg       Placed in And Linked Group   400 mg 200 mL/hr over 60 Minutes Intravenous  Once 09/20/24 2158 09/20/24 2335   09/20/24 2200  metroNIDAZOLE  (FLAGYL ) IVPB 500 mg       Placed in And Linked Group   500 mg 100 mL/hr over 60 Minutes Intravenous  Once 09/20/24 2158 09/21/24 0038       Assessment/Plan:  83 year old male with history of A-fib on Eliquis  (held since admission) 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 We discussed the alternatives and potential risks of surgery, including but not limited to: bleeding, infection, damage to bowel or surrounding structures, bile leak, pancreatitis, retained stone, damage to the biliary system, and need for additional procedures. All questions were addressed and consent was obtained.       LOS: 1 day    Cordella DELENA Idler 09/22/2024

## 2024-09-22 NOTE — Progress Notes (Signed)
 Progress Note   Subjective  Patient denies any abdominal pain today, states he continues to feel well.  Liver enzymes coming down.  Seen by general surgery, planning lap chole with IOC today.    Objective   Vital signs in last 24 hours: Temp:  [98 F (36.7 C)-98.6 F (37 C)] 98 F (36.7 C) (11/28 0817) Pulse Rate:  [62-72] 63 (11/28 0817) Resp:  [16-18] 16 (11/28 0817) BP: (113-147)/(51-80) 124/55 (11/28 0817) SpO2:  [96 %-99 %] 98 % (11/28 0817) Last BM Date : 09/21/24 General:    white male in NAD Neurologic:  Alert and oriented,  grossly normal neurologically. Psych:  Cooperative. Normal mood and affect.  Intake/Output from previous day: 11/27 0701 - 11/28 0700 In: 552.5 [I.V.:352.5; IV Piggyback:200] Out: 200 [Urine:200] Intake/Output this shift: No intake/output data recorded.  Lab Results: Recent Labs    09/20/24 1936 09/21/24 0720 09/22/24 0528  WBC 10.9* 8.0 11.2*  HGB 13.4 12.7* 11.8*  HCT 39.7 37.9* 35.0*  PLT 222 184 186   BMET Recent Labs    09/20/24 1936 09/21/24 0720 09/22/24 0528  NA 130* 133* 133*  K 4.0 3.9 3.7  CL 94* 98 100  CO2 26 23 21*  GLUCOSE 118* 83 76  BUN 16 12 13   CREATININE 1.17 1.07 0.90  CALCIUM 9.7 8.5* 8.3*   LFT Recent Labs    09/22/24 0528  PROT 6.0*  ALBUMIN 3.3*  AST 72*  ALT 196*  ALKPHOS 136*  BILITOT 1.5*   PT/INR Recent Labs    09/22/24 0528  LABPROT 16.7*  INR 1.3*    Studies/Results: ECHOCARDIOGRAM LIMITED Result Date: 09/21/2024    ECHOCARDIOGRAM LIMITED REPORT   Patient Name:   Lance Hubbard Date of Exam: 09/21/2024 Medical Rec #:  [id number]     Height:       74.0 in Accession #:    [id number]    Weight:       220.0 lb Date of Birth:  [birth year]    BSA:          2.263 m Patient Age:    83 years      BP:           113/65 mmHg Patient Gender: M             HR:           61 bpm. Exam Location:  Inpatient Procedure: Limited Echo, Cardiac Doppler and Color Doppler (Both Spectral and             Color Flow Doppler were utilized during procedure). Indications:    Pre-operative cardiovascular examination Z01.810  History:        Patient has prior history of Echocardiogram examinations, most                 recent 09/26/2019. Arrythmias:Atrial Fibrillation; Risk                 Factors:Hypertension.  Sonographer:    Aida Pizza RCS Referring Phys: [id number] MAURICIO DANIEL ARRIEN IMPRESSIONS  1. Left ventricular ejection fraction, by estimation, is 30 to 35%. The left ventricle has moderately decreased function. The left ventricle demonstrates global hypokinesis. Left ventricular diastolic parameters are indeterminate. Elevated left ventricular end-diastolic pressure.  2. Right ventricular systolic function is normal. The right ventricular size is normal. There is severely elevated pulmonary artery systolic pressure. The estimated right ventricular systolic pressure is 65.1 mmHg.  3. Left atrial size was  moderately dilated.  4. The mitral valve is normal in structure. Trivial mitral valve regurgitation. No evidence of mitral stenosis.  5. The aortic valve is normal in structure. There is mild calcification of the aortic valve. Aortic valve regurgitation is not visualized. Aortic valve sclerosis/calcification is present, without any evidence of aortic stenosis.  6. The inferior vena cava is dilated in size with <50% respiratory variability, suggesting right atrial pressure of 15 mmHg. FINDINGS  Left Ventricle: Left ventricular ejection fraction, by estimation, is 30 to 35%. The left ventricle has moderately decreased function. The left ventricle demonstrates global hypokinesis. The left ventricular internal cavity size was normal in size. There is no left ventricular hypertrophy. Left ventricular diastolic parameters are indeterminate. Elevated left ventricular end-diastolic pressure. Right Ventricle: The right ventricular size is normal. No increase in right ventricular wall thickness. Right ventricular  systolic function is normal. There is severely elevated pulmonary artery systolic pressure. The tricuspid regurgitant velocity is 3.54 m/s, and with an assumed right atrial pressure of 15 mmHg, the estimated right ventricular systolic pressure is 65.1 mmHg. Left Atrium: Left atrial size was moderately dilated. Right Atrium: Right atrial size was normal in size. Pericardium: There is no evidence of pericardial effusion. Mitral Valve: The mitral valve is normal in structure. Trivial mitral valve regurgitation. No evidence of mitral valve stenosis. Tricuspid Valve: The tricuspid valve is normal in structure. Tricuspid valve regurgitation is mild . No evidence of tricuspid stenosis. Aortic Valve: The aortic valve is normal in structure. There is mild calcification of the aortic valve. Aortic valve regurgitation is not visualized. Aortic valve sclerosis/calcification is present, without any evidence of aortic stenosis. Pulmonic Valve: The pulmonic valve was normal in structure. Pulmonic valve regurgitation is trivial. No evidence of pulmonic stenosis. Aorta: The aortic root is normal in size and structure. Venous: The inferior vena cava is dilated in size with less than 50% respiratory variability, suggesting right atrial pressure of 15 mmHg. IAS/Shunts: No atrial level shunt detected by color flow Doppler. LEFT VENTRICLE PLAX 2D LVIDd:         6.20 cm LVIDs:         4.30 cm LV PW:         1.00 cm LV IVS:        1.00 cm LVOT diam:     2.30 cm LVOT Area:     4.15 cm  RIGHT VENTRICLE TAPSE (M-mode): 2.5 cm LEFT ATRIUM         Index LA diam:    5.40 cm 2.39 cm/m   AORTA Ao Root diam: 3.30 cm MITRAL VALVE                TRICUSPID VALVE MV Area (PHT): 4.60 cm     TR Peak grad:   50.1 mmHg MV Decel Time: 165 msec     TR Vmax:        354.00 cm/s MV E velocity: 114.00 cm/s MV A velocity: 54.80 cm/s   SHUNTS MV E/A ratio:  2.08         Systemic Diam: 2.30 cm Toribio Fuel MD Electronically signed by Toribio Fuel MD  Signature Date/Time: 09/21/2024/2:11:02 PM    Final    CT ABDOMEN PELVIS W CONTRAST Result Date: 09/20/2024 EXAM: CT ABDOMEN AND PELVIS WITH CONTRAST 09/20/2024 09:23:03 PM TECHNIQUE: CT of the abdomen and pelvis was performed with the administration of 100 mL of iohexol  (OMNIPAQUE ) 300 MG/ML solution. Multiplanar reformatted images are provided for review. Automated exposure control, iterative  reconstruction, and/or weight-based adjustment of the mA/kV was utilized to reduce the radiation dose to as low as reasonably achievable. COMPARISON: Comparison with 03/26/2005. CLINICAL HISTORY: Bowel obstruction suspected; upper abd pain, constipation. FINDINGS: LOWER CHEST: Small right pleural effusion with basilar atelectasis or consolidation. Small esophageal hiatal hernia. LIVER: Circumscribed cyst in the left lobe of the liver. No imaging follow-up is indicated. GALLBLADDER AND BILE DUCTS: Cholelithiasis with several stones in the gallbladder. Mild gallbladder wall thickening and pericholecystic edema. Changes may indicate acute cholecystitis in the appropriate clinical setting. There is a common duct stone in the distal common bile duct with mild bile duct dilatation. SPLEEN: Spleen is normal. PANCREAS: Infiltration and edema around the pancreas consistent with acute pancreatitis. No pancreatic ductal dilatation. No loculated collections. ADRENAL GLANDS: Adrenal glands are normal. KIDNEYS, URETERS AND BLADDER: The kidneys are normal. No stones in the kidneys or ureters. No hydronephrosis. No perinephric or periureteral stranding. Bladder is normal. GI AND BOWEL: Stomach demonstrates no acute abnormality. There is no bowel obstruction. PERITONEUM AND RETROPERITONEUM: No ascites. No free air. VASCULATURE: Calcification of the aorta. No aneurysm. LYMPH NODES: No significant lymphadenopathy. REPRODUCTIVE ORGANS: Prostate gland is markedly enlarged, measuring 7 cm in diameter. BONES AND SOFT TISSUES: Degenerative  changes in the spine. Small right inguinal hernia containing fat. No acute osseous abnormality. No focal soft tissue abnormality. IMPRESSION: 1. Findings consistent with acute pancreatitis without pancreatic ductal dilatation or loculated collections. 2. Cholelithiasis with several gallbladder stones, mild gallbladder wall thickening, and pericholecystic edema, suggestive of acute cholecystitis, with a distal common bile duct stone and mild biliary dilatation. 3. Small right pleural effusion with basilar atelectasis or consolidation. 4. Small right inguinal hernia containing fat. Electronically signed by: Elsie Gravely MD 09/20/2024 09:31 PM EST RP Workstation: [id number]       Assessment / Plan:    83 year old male here with the following:  Gallstone pancreatitis Choledocholithiasis Anticoagulated  Liver enzymes have significantly downtrended, and his pain resolved.  Clinically it is likely that the gallstone passed from his common bile duct.  Surgery planning lap chole today with IOC which I think is reasonable next step.  If by chance he has a retained stone in his bile duct please let us  know, and we will plan for ERCP in a setting.  Hopefully ERCP is not needed at this point and IOC is clear.  Call with questions.  Marcey Naval, MD [facility]

## 2024-09-22 NOTE — Anesthesia Preprocedure Evaluation (Addendum)
 Anesthesia Evaluation  Patient identified by MRN, date of birth, ID band Patient awake    Reviewed: Allergy & Precautions, NPO status , Patient's Chart, lab work & pertinent test results, reviewed documented beta blocker date and time   Airway Mallampati: II  TM Distance: >3 FB Neck ROM: Full    Dental  (+) Teeth Intact, Dental Advisory Given   Pulmonary former smoker   breath sounds clear to auscultation       Cardiovascular hypertension, Pt. on home beta blockers and Pt. on medications + dysrhythmias Atrial Fibrillation  Rhythm:Irregular Rate:Abnormal     Neuro/Psych    GI/Hepatic negative GI ROS, Neg liver ROS,,,  Endo/Other  negative endocrine ROS    Renal/GU negative Renal ROS     Musculoskeletal  (+) Arthritis ,    Abdominal   Peds  Hematology  (+) Blood dyscrasia, anemia   Anesthesia Other Findings   Reproductive/Obstetrics                              Anesthesia Physical Anesthesia Plan  ASA: 3  Anesthesia Plan: General   Post-op Pain Management: Minimal or no pain anticipated   Induction: Intravenous  PONV Risk Score and Plan: 2 and Ondansetron , Midazolam  and Treatment may vary due to age or medical condition  Airway Management Planned: Oral ETT  Additional Equipment: None  Intra-op Plan:   Post-operative Plan: Extubation in OR  Informed Consent: I have reviewed the patients History and Physical, chart, labs and discussed the procedure including the risks, benefits and alternatives for the proposed anesthesia with the patient or authorized representative who has indicated his/her understanding and acceptance.       Plan Discussed with: CRNA  Anesthesia Plan Comments:         Anesthesia Quick Evaluation

## 2024-09-22 NOTE — Anesthesia Postprocedure Evaluation (Signed)
 Anesthesia Post Note  Patient: Lance Hubbard  Procedure(s) Performed: LAPAROSCOPIC CHOLECYSTECTOMY (Abdomen)     Patient location during evaluation: PACU Anesthesia Type: General Level of consciousness: awake and alert Pain management: pain level controlled Vital Signs Assessment: post-procedure vital signs reviewed and stable Respiratory status: spontaneous breathing, nonlabored ventilation and respiratory function stable Cardiovascular status: blood pressure returned to baseline and stable Postop Assessment: no apparent nausea or vomiting Anesthetic complications: no   No notable events documented.  Last Vitals:  Vitals:   09/22/24 1540 09/22/24 1556  BP: 124/60 124/74  Pulse: 89 87  Resp: 17 18  Temp:  (!) 36.4 C  SpO2: 96% 97%    Last Pain:  Vitals:   09/22/24 1556  TempSrc: Oral  PainSc: 0-No pain   Pain Goal: Patients Stated Pain Goal: 0 (09/22/24 1556)                 Butler Levander Pinal

## 2024-09-22 NOTE — Progress Notes (Signed)
 Pt and wife asked about home medications, specifically pt's Eliquis .  This RN paged on call hospitalist to inquire.  Received response that general surgery will have to decide when it is safe to resume Eliquis .

## 2024-09-22 NOTE — Op Note (Addendum)
 Patient: Lance Hubbard MRN: [id number] DOB: 1941-05-20 Sex: male Operation/Procedure Date: 09/22/2024 Surgeons and Role:    * Polly, Cordella LABOR, MD - Primary  Pre-operative Diagnoses: Cholecystitis Postoperative Diagnoses: Cholecystitis  Procedure performed: Procedures:   * LAPAROSCOPIC CHOLECYSTECTOMY  Anesthesia: General endotracheal anesthesia  Indications: Lance Hubbard is an 83 year old male who initially presented to the ED with abdominal pain. He underwent a workup that was consistent with gallstone pancreatitis. His abdominal pain improved and it was recommended that he undergo a cholecystectomy. Preoperatively, I discussed in detail the risks, benefits, alternatives, and potential complications. The patient understands and requests to proceed.  Operative Findings: Significant inflammation consistent with acute cholecystitis. Short cystic duct with significant fibrosis made IOC prohibitive. Critical view obtained prior to placing clips. Cystic duct secured with PDS endoloop. Cyst on the liver surface consistent with CT findings.   Operative Narrative: The patient was positively identified and was taken to the operating room and placed supine on the operating table. A time-out was performed confirming correct patient and procedure. We also confirmed initiation of deep venous thrombosis prophylaxis and wound prophylaxis. After successful induction of general endotracheal anesthesia, the arms were carefully padded. An orogastric tube and footboard were placed. The abdomen was prepped and draped in the usual sterile surgical fashion.  We began our peritoneal access with a veress needle inserted at Palmer's point.  After aspiration showed return of air bubbles and there was a positive saline drop test, the insufflation was connected and the abdomen brought to a pressure of .  We then used an opti-view technique to place a 5mm port just to the right of midline, superior to the  umbilicus.  A laparoscope was introduced into the abdomen, and there were no signs of injury from entry.  A 12mm port was placed in the subxiphoid position.  Two additional 5mm ports were placed in the RUQ.  A 360-degree visualization with a 30-degree 5-mm laparoscope revealed grossly normal intra-abdominal contents.   The patient was placed in the head up position and tilted slightly to the left. The dome of the gallbladder was then grasped, elevated, and retracted anteriorly and cephalad.  The infundibulum was retracted laterally and inferiorly exposing Calot's triangle. The investing visceral peritoneal attachments overlying the infundibulum of the gallbladder were incised using the hook electrocautery and dissected free from the gallbladder itself. We developed two structures going into the gallbladder consistent with the cystic duct and cystic artery. The gallbladder was dissected off the fossa, establishing the critical view of safety. The cystic artery was isolated, clipped, and transected. There was a significant amount of inflammation adjacent to the cystic duct and the duct itself appeared relatively short. I was able to carefully dissect it free and could see that it was clearly entering the gallbladder. However, given the fibrosis and short duct size I did not feel that an IOC was safe. The cystic duct was triply clipped and cut. It was somewhat dilated so I decided to place a PDS endo-loop to ligate it. The gallbladder was elevated off the gallbladder fossa using the hook Bovie. The gallbladder was then exteriorized through the subxiphoid port site using an EndoCatch bag. The fossa was examined and there was a small area of ooze that was controlled with electrocautery. The area was irrigated until the effluent ran clear. Arista was placed on the fossa.The 12mm port site was closed using an 0-vicryl on a suture passer.We placed 0.25% Marcaine  with epinephrine  at each incision site  for local anesthesia.  The skin was closed using 4-0 Monocryl subcuticular suture. Dermabond was applied. The patient tolerated the procedure well, was extubated, and taken to the recovery room.  Estimated Blood Loss: 75mL Specimens: Gallbladder Implants: None Drains: None Complications: None Condition of the patient: Good, extubated Disposition: PACU  CASE DATA: Type of patient?: DOW CASE (Surgical Hospitalist [facility] Inpatient) Status of Case? URGENT Add On Infection Present At Time Of Surgery (PATOS)?  Purulence within the gallbladder   Cordella DELENA Idler Date: 09/22/2024 Time: 2:38 PM

## 2024-09-22 NOTE — Anesthesia Procedure Notes (Signed)
 Procedure Name: Intubation Date/Time: 09/22/2024 12:59 PM  Performed by: Marva Lonni PARAS, CRNAPre-anesthesia Checklist: Patient identified, Emergency Drugs available, Suction available and Patient being monitored Patient Re-evaluated:Patient Re-evaluated prior to induction Oxygen Delivery Method: Circle System Utilized Preoxygenation: Pre-oxygenation with 100% oxygen Induction Type: IV induction Ventilation: Mask ventilation without difficulty Tube type: Oral Tube size: 7.5 mm Number of attempts: 1 Airway Equipment and Method: Stylet and Oral airway Placement Confirmation: ETT inserted through vocal cords under direct vision, positive ETCO2 and breath sounds checked- equal and bilateral Secured at: 23 cm Tube secured with: Tape Dental Injury: Teeth and Oropharynx as per pre-operative assessment

## 2024-09-22 NOTE — Progress Notes (Signed)
 Subjective/Chief Complaint: Feels well this morning, no pain   Objective: Vital signs in last 24 hours: Temp:  [98 F (36.7 C)-98.6 F (37 C)] 98 F (36.7 C) (11/28 0547) Pulse Rate:  [62-72] 66 (11/28 0547) Resp:  [16-18] 18 (11/28 0547) BP: (113-147)/(51-80) 119/67 (11/28 0547) SpO2:  [96 %-99 %] 97 % (11/28 0547) Last BM Date : 09/21/24  Intake/Output from previous day: 11/27 0701 - 11/28 0700 In: 552.5 [I.V.:352.5; IV Piggyback:200] Out: 200 [Urine:200] Intake/Output this shift: No intake/output data recorded.  Alert, well-appearing Unlabored respirations Abdomen is soft, nontender  Lab Results:  Recent Labs    09/21/24 0720 09/22/24 0528  WBC 8.0 11.2*  HGB 12.7* 11.8*  HCT 37.9* 35.0*  PLT 184 186   BMET Recent Labs    09/21/24 0720 09/22/24 0528  NA 133* 133*  K 3.9 3.7  CL 98 100  CO2 23 21*  GLUCOSE 83 76  BUN 12 13  CREATININE 1.07 0.90  CALCIUM 8.5* 8.3*   PT/INR Recent Labs    09/22/24 0528  LABPROT 16.7*  INR 1.3*   ABG No results for input(s): PHART, HCO3 in the last 72 hours.  Invalid input(s): PCO2, PO2  Studies/Results: ECHOCARDIOGRAM LIMITED Result Date: 09/21/2024    ECHOCARDIOGRAM LIMITED REPORT   Patient Name:   Lance Hubbard Date of Exam: 09/21/2024 Medical Rec #:  [id number]     Height:       74.0 in Accession #:    [id number]    Weight:       220.0 lb Date of Birth:  [birth year]    BSA:          2.263 m Patient Age:    83 years      BP:           113/65 mmHg Patient Gender: M             HR:           61 bpm. Exam Location:  Inpatient Procedure: Limited Echo, Cardiac Doppler and Color Doppler (Both Spectral and            Color Flow Doppler were utilized during procedure). Indications:    Pre-operative cardiovascular examination Z01.810  History:        Patient has prior history of Echocardiogram examinations, most                 recent 09/26/2019. Arrythmias:Atrial Fibrillation; Risk                  Factors:Hypertension.  Sonographer:    Aida Pizza RCS Referring Phys: [id number] MAURICIO DANIEL ARRIEN IMPRESSIONS  1. Left ventricular ejection fraction, by estimation, is 30 to 35%. The left ventricle has moderately decreased function. The left ventricle demonstrates global hypokinesis. Left ventricular diastolic parameters are indeterminate. Elevated left ventricular end-diastolic pressure.  2. Right ventricular systolic function is normal. The right ventricular size is normal. There is severely elevated pulmonary artery systolic pressure. The estimated right ventricular systolic pressure is 65.1 mmHg.  3. Left atrial size was moderately dilated.  4. The mitral valve is normal in structure. Trivial mitral valve regurgitation. No evidence of mitral stenosis.  5. The aortic valve is normal in structure. There is mild calcification of the aortic valve. Aortic valve regurgitation is not visualized. Aortic valve sclerosis/calcification is present, without any evidence of aortic stenosis.  6. The inferior vena cava is dilated in size with <50% respiratory variability, suggesting right atrial pressure of  15 mmHg. FINDINGS  Left Ventricle: Left ventricular ejection fraction, by estimation, is 30 to 35%. The left ventricle has moderately decreased function. The left ventricle demonstrates global hypokinesis. The left ventricular internal cavity size was normal in size. There is no left ventricular hypertrophy. Left ventricular diastolic parameters are indeterminate. Elevated left ventricular end-diastolic pressure. Right Ventricle: The right ventricular size is normal. No increase in right ventricular wall thickness. Right ventricular systolic function is normal. There is severely elevated pulmonary artery systolic pressure. The tricuspid regurgitant velocity is 3.54 m/s, and with an assumed right atrial pressure of 15 mmHg, the estimated right ventricular systolic pressure is 65.1 mmHg. Left Atrium: Left atrial size was  moderately dilated. Right Atrium: Right atrial size was normal in size. Pericardium: There is no evidence of pericardial effusion. Mitral Valve: The mitral valve is normal in structure. Trivial mitral valve regurgitation. No evidence of mitral valve stenosis. Tricuspid Valve: The tricuspid valve is normal in structure. Tricuspid valve regurgitation is mild . No evidence of tricuspid stenosis. Aortic Valve: The aortic valve is normal in structure. There is mild calcification of the aortic valve. Aortic valve regurgitation is not visualized. Aortic valve sclerosis/calcification is present, without any evidence of aortic stenosis. Pulmonic Valve: The pulmonic valve was normal in structure. Pulmonic valve regurgitation is trivial. No evidence of pulmonic stenosis. Aorta: The aortic root is normal in size and structure. Venous: The inferior vena cava is dilated in size with less than 50% respiratory variability, suggesting right atrial pressure of 15 mmHg. IAS/Shunts: No atrial level shunt detected by color flow Doppler. LEFT VENTRICLE PLAX 2D LVIDd:         6.20 cm LVIDs:         4.30 cm LV PW:         1.00 cm LV IVS:        1.00 cm LVOT diam:     2.30 cm LVOT Area:     4.15 cm  RIGHT VENTRICLE TAPSE (M-mode): 2.5 cm LEFT ATRIUM         Index LA diam:    5.40 cm 2.39 cm/m   AORTA Ao Root diam: 3.30 cm MITRAL VALVE                TRICUSPID VALVE MV Area (PHT): 4.60 cm     TR Peak grad:   50.1 mmHg MV Decel Time: 165 msec     TR Vmax:        354.00 cm/s MV E velocity: 114.00 cm/s MV A velocity: 54.80 cm/s   SHUNTS MV E/A ratio:  2.08         Systemic Diam: 2.30 cm Toribio Fuel MD Electronically signed by Toribio Fuel MD Signature Date/Time: 09/21/2024/2:11:02 PM    Final    CT ABDOMEN PELVIS W CONTRAST Result Date: 09/20/2024 EXAM: CT ABDOMEN AND PELVIS WITH CONTRAST 09/20/2024 09:23:03 PM TECHNIQUE: CT of the abdomen and pelvis was performed with the administration of 100 mL of iohexol  (OMNIPAQUE ) 300  MG/ML solution. Multiplanar reformatted images are provided for review. Automated exposure control, iterative reconstruction, and/or weight-based adjustment of the mA/kV was utilized to reduce the radiation dose to as low as reasonably achievable. COMPARISON: Comparison with 03/26/2005. CLINICAL HISTORY: Bowel obstruction suspected; upper abd pain, constipation. FINDINGS: LOWER CHEST: Small right pleural effusion with basilar atelectasis or consolidation. Small esophageal hiatal hernia. LIVER: Circumscribed cyst in the left lobe of the liver. No imaging follow-up is indicated. GALLBLADDER AND BILE DUCTS: Cholelithiasis with several stones in  the gallbladder. Mild gallbladder wall thickening and pericholecystic edema. Changes may indicate acute cholecystitis in the appropriate clinical setting. There is a common duct stone in the distal common bile duct with mild bile duct dilatation. SPLEEN: Spleen is normal. PANCREAS: Infiltration and edema around the pancreas consistent with acute pancreatitis. No pancreatic ductal dilatation. No loculated collections. ADRENAL GLANDS: Adrenal glands are normal. KIDNEYS, URETERS AND BLADDER: The kidneys are normal. No stones in the kidneys or ureters. No hydronephrosis. No perinephric or periureteral stranding. Bladder is normal. GI AND BOWEL: Stomach demonstrates no acute abnormality. There is no bowel obstruction. PERITONEUM AND RETROPERITONEUM: No ascites. No free air. VASCULATURE: Calcification of the aorta. No aneurysm. LYMPH NODES: No significant lymphadenopathy. REPRODUCTIVE ORGANS: Prostate gland is markedly enlarged, measuring 7 cm in diameter. BONES AND SOFT TISSUES: Degenerative changes in the spine. Small right inguinal hernia containing fat. No acute osseous abnormality. No focal soft tissue abnormality. IMPRESSION: 1. Findings consistent with acute pancreatitis without pancreatic ductal dilatation or loculated collections. 2. Cholelithiasis with several gallbladder  stones, mild gallbladder wall thickening, and pericholecystic edema, suggestive of acute cholecystitis, with a distal common bile duct stone and mild biliary dilatation. 3. Small right pleural effusion with basilar atelectasis or consolidation. 4. Small right inguinal hernia containing fat. Electronically signed by: Elsie Gravely MD 09/20/2024 09:31 PM EST RP Workstation: [id number]    Anti-infectives: Anti-infectives (From admission, onward)    Start     Dose/Rate Route Frequency Ordered Stop   09/21/24 1000  cefTRIAXone  (ROCEPHIN ) 2 g in sodium chloride  0.9 % 100 mL IVPB        2 g 200 mL/hr over 30 Minutes Intravenous Daily 09/21/24 0633     09/21/24 0645  metroNIDAZOLE  (FLAGYL ) IVPB 500 mg        500 mg 100 mL/hr over 60 Minutes Intravenous 2 times daily 09/21/24 9366     09/20/24 2200  ciprofloxacin  (CIPRO ) IVPB 400 mg       Placed in And Linked Group   400 mg 200 mL/hr over 60 Minutes Intravenous  Once 09/20/24 2158 09/20/24 2335   09/20/24 2200  metroNIDAZOLE  (FLAGYL ) IVPB 500 mg       Placed in And Linked Group   500 mg 100 mL/hr over 60 Minutes Intravenous  Once 09/20/24 2158 09/21/24 0038       Assessment/Plan:  83 year old male with history of A-fib on Eliquis  (held since admission) and 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  White count up slightly at 11.2 from 8 - Suspect that he has passed the gallstone and recommend proceeding with laparoscopic cholecystectomy with intraoperative cholangiogram.  I reviewed the relevant anatomy and surgical technique with the patient yesterday as well as discussed risks of surgery including bleeding, faction, pain, scarring, intraabdominal injury specifically to the common bile duct and sequelae, bile leak, conversion to open surgery, subtotal  cholecystectomy, blood clot, pneumonia, heart attack, stroke, failure to resolve symptoms, etc. Questions welcomed and answered.  Will tentatively plan surgery today pending or availability with Dr. Polly.   LOS: 1 day    Mitzie DELENA Freund 09/22/2024

## 2024-09-22 NOTE — Progress Notes (Signed)
 Progress Note   Patient: Lance Hubbard FMW:[id number] DOB: 1941-01-15 DOA: 09/20/2024     1 DOS: the patient was seen and examined on 09/22/2024   Brief hospital course: [name] was admitted to the hospital with the working diagnosis of acute pancreatitis.   83 yo male with the past medical history of hypertension, CML, hypothyroidism, hyperlipidemia and atrial fibrillation who presented with abdominal pain, nausea and vomiting. Reported intermittent epigastric abdominal pain for the last 6 weeks, but this time pain was more persistent, severe and associated with nausea and vomiting, prompting him to come to the ed.  On his initial physical examination his blood pressure was 148/75, HR 73, RR 18 and 02 saturation 100% on room air Lungs with no wheezing or rhonchi, heart with S1 and S2 present and regular, abdomen with tenderness to palpation at the epigastrium with no rebound or guarding, no lower extremity edema.   Na 130, K 4.0 Cl 94 bicarbonate 26 glucose 118, bun 16 cr 1,17  ALK P 192, lipase > 2,800  AST 220 ALT 399 total bilirubin 3,6  Wbc 10,9 hgb 13.5 plt 222  Urine analysis SG 1,025, protein trace, negative glucose, negative leukocytes, negative hgb.   EKG 82 bpm, normal axis, normal intervals, qtc 450, sinus rhythm with PAC and PVH, with no significant ST segment or T wave changes.   CT abdomen and pelvis with acute pancreatitis without pancreatic ductal dilatation or loculated collections.  Cholelithiasis with several gallbladder stone, mild gallbladder wall thickening and pericholecystic edema, suggesting acute cholecystitis, with a distal common bile duct stone and mild bilary dilatation.  Small right pleural effusion.  Small right inguinal hernia containing fat.   Patient placed on IV fluids and IV antibiotic therapy NPO  Consulted GI for possible ERCP 11/28 patient with improved abdominal pain, liver enzymes and bilirubins improving, plan for cholecystectomy with intra  operative cholangiogram.   Assessment and Plan: * Acute gallstone pancreatitis Gallstone pancreatitis and cholecystitis.  Elevated liver enzymes due to choledocholithiasis   Improved abdominal pain and liver enzymes profile.  AST 72, ALT 196, ALK P 136, Total bilirubin 1,5  Wbc 11.2, afebrile.   IV antibiotic therapy with Ceftriaxone  and metronidazole .  Continue NPO for now.  As needed analgesics and antiemetics Scheduled proton pump inhibitor.  Plan for cholecystectomy with intra operative cholangiogram today.   HTN (hypertension) Continue to hold on entresto  for now, to prevent hypotension Continue blood pressure monitoring   Chronic systolic CHF (congestive heart failure) (HCC) Echocardiogram with reduced LV systolic function 30 to 35%, global hypokinesis, RV systolic function preserved, RVSP 65.1 mmHg, LA with moderate dilatation, no significant valvular disease.   No signs of decompensation Plan to hold on entersto for now due to risk of hypotension.  Discontinue IV fluids to prevent hypervolemia.   Paroxysmal atrial fibrillation (HCC) Patient on sinus rhythm with frequent PVC and PAC.  He is not on AV blockade at home Currently on hold apixaban  in preparation for surgical procedure today.  Keep K at 4 and Mg at 2  Hyponatremia Follow up renal function with serum cr at 0.90 with K at 3,7 and serum bicarbonate at 21  Na 133  Plan to add Kcl 40 meq IV to keep K at 4 Check Mg Hold on IV fluids Follow up renal function and electrolytes in am.   CML (chronic myelocytic leukemia) (HCC) Cell count stable, plan to follow up as outpatient.       Subjective: Patient with no chest  pain and no dyspnea, no abdominal pain, non nausea or vomiting, positive bowel movement.   Physical Exam: Vitals:   09/21/24 2016 09/21/24 2348 09/22/24 0547 09/22/24 0817  BP: 133/80 (!) 113/51 119/67 (!) 124/55  Pulse: 69 62 66 63  Resp: 16 18 18 16   Temp: 98 F (36.7 C) 98.6 F (37 C)  98 F (36.7 C) 98 F (36.7 C)  TempSrc: Oral Oral Oral Oral  SpO2: 99% 99% 97% 98%  Weight:      Height:       Neurology awake and alert ENT with no pallor or icterus Cardiovascular with S1 and S2 present and regular, positive extra beats no gallops, rubs or murmurs Respiratory with no rales or wheezing, no rhonchi  Abdomen with no distention No lower extremity edema   Data Reviewed:    Family Communication: no family at the bedside   Disposition: Status is: Inpatient Remains inpatient appropriate because: cholecystectomy   Planned Discharge Destination: Home    Author: Elidia Toribio Furnace, MD 09/22/2024 9:32 AM  For on call review [email or web address].

## 2024-09-23 ENCOUNTER — Encounter (HOSPITAL_COMMUNITY): Payer: Self-pay | Admitting: General Surgery

## 2024-09-23 ENCOUNTER — Inpatient Hospital Stay (HOSPITAL_COMMUNITY)

## 2024-09-23 DIAGNOSIS — I159 Secondary hypertension, unspecified: Secondary | ICD-10-CM | POA: Diagnosis not present

## 2024-09-23 DIAGNOSIS — K851 Biliary acute pancreatitis without necrosis or infection: Secondary | ICD-10-CM | POA: Diagnosis not present

## 2024-09-23 DIAGNOSIS — I480 Paroxysmal atrial fibrillation: Secondary | ICD-10-CM | POA: Diagnosis not present

## 2024-09-23 DIAGNOSIS — I5022 Chronic systolic (congestive) heart failure: Secondary | ICD-10-CM | POA: Diagnosis not present

## 2024-09-23 LAB — COMPREHENSIVE METABOLIC PANEL WITH GFR
ALT: 164 U/L — ABNORMAL HIGH (ref 0–44)
AST: 58 U/L — ABNORMAL HIGH (ref 15–41)
Albumin: 3.2 g/dL — ABNORMAL LOW (ref 3.5–5.0)
Alkaline Phosphatase: 110 U/L (ref 38–126)
Anion gap: 10 (ref 5–15)
BUN: 12 mg/dL (ref 8–23)
CO2: 22 mmol/L (ref 22–32)
Calcium: 8 mg/dL — ABNORMAL LOW (ref 8.9–10.3)
Chloride: 101 mmol/L (ref 98–111)
Creatinine, Ser: 1.02 mg/dL (ref 0.61–1.24)
GFR, Estimated: >60 mL/min (ref >60)
Glucose, Bld: 103 mg/dL — ABNORMAL HIGH (ref 70–99)
Potassium: 3.9 mmol/L (ref 3.5–5.1)
Sodium: 133 mmol/L — ABNORMAL LOW (ref 135–145)
Total Bilirubin: 1.4 mg/dL — ABNORMAL HIGH (ref 0.0–1.2)
Total Protein: 5.9 g/dL — ABNORMAL LOW (ref 6.5–8.1)

## 2024-09-23 LAB — CBC
HCT: 34.9 % — ABNORMAL LOW (ref 39.0–52.0)
Hemoglobin: 11.9 g/dL — ABNORMAL LOW (ref 13.0–17.0)
MCH: 29.5 pg (ref 26.0–34.0)
MCHC: 34.1 g/dL (ref 30.0–36.0)
MCV: 86.6 fL (ref 80.0–100.0)
Platelets: 192 K/uL (ref 150–400)
RBC: 4.03 MIL/uL — ABNORMAL LOW (ref 4.22–5.81)
RDW: 13.9 % (ref 11.5–15.5)
WBC: 11.3 K/uL — ABNORMAL HIGH (ref 4.0–10.5)
nRBC: 0 % (ref 0.0–0.2)

## 2024-09-23 LAB — MAGNESIUM: Magnesium: 2 mg/dL (ref 1.7–2.4)

## 2024-09-23 MED ORDER — METRONIDAZOLE 500 MG PO TABS
500.0000 mg | ORAL_TABLET | Freq: Two times a day (BID) | ORAL | Status: DC
  Administered 2024-09-23 – 2024-09-24 (×2): 500 mg via ORAL
  Filled 2024-09-23 (×2): qty 1

## 2024-09-23 MED ORDER — APIXABAN 5 MG PO TABS
5.0000 mg | ORAL_TABLET | Freq: Two times a day (BID) | ORAL | Status: DC
  Administered 2024-09-23 – 2024-09-24 (×2): 5 mg via ORAL
  Filled 2024-09-23 (×2): qty 1

## 2024-09-23 MED ORDER — CIPROFLOXACIN HCL 500 MG PO TABS
500.0000 mg | ORAL_TABLET | Freq: Two times a day (BID) | ORAL | Status: DC
  Administered 2024-09-23 – 2024-09-24 (×2): 500 mg via ORAL
  Filled 2024-09-23 (×2): qty 1

## 2024-09-23 MED ORDER — OXYCODONE HCL 5 MG PO CAPS: 5.0000 mg | ORAL_CAPSULE | Freq: Three times a day (TID) | ORAL | 0 refills | Status: DC | PRN

## 2024-09-23 MED ORDER — DOCUSATE SODIUM 100 MG PO CAPS: 100.0000 mg | ORAL_CAPSULE | Freq: Two times a day (BID) | ORAL | 0 refills | Status: DC

## 2024-09-23 NOTE — Progress Notes (Signed)
 Progress Note   Patient: Lance Hubbard FMW:[id number] DOB: 05-28-1941 DOA: 09/20/2024     2 DOS: the patient was seen and examined on 09/23/2024   Brief hospital course: Mr. Lance Hubbard was admitted to the hospital with the working diagnosis of acute pancreatitis.   83 yo male with the past medical history of hypertension, CML, hypothyroidism, hyperlipidemia and atrial fibrillation who presented with abdominal pain, nausea and vomiting. Reported intermittent epigastric abdominal pain for the last 6 weeks, but this time pain was more persistent, severe and associated with nausea and vomiting, prompting him to come to the ed.  On his initial physical examination his blood pressure was 148/75, HR 73, RR 18 and 02 saturation 100% on room air Lungs with no wheezing or rhonchi, heart with S1 and S2 present and regular, abdomen with tenderness to palpation at the epigastrium with no rebound or guarding, no lower extremity edema.   Na 130, K 4.0 Cl 94 bicarbonate 26 glucose 118, bun 16 cr 1,17  ALK P 192, lipase > 2,800  AST 220 ALT 399 total bilirubin 3,6  Wbc 10,9 hgb 13.5 plt 222  Urine analysis SG 1,025, protein trace, negative glucose, negative leukocytes, negative hgb.   EKG 82 bpm, normal axis, normal intervals, qtc 450, sinus rhythm with PAC and PVH, with no significant ST segment or T wave changes.   CT abdomen and pelvis with acute pancreatitis without pancreatic ductal dilatation or loculated collections.  Cholelithiasis with several gallbladder stone, mild gallbladder wall thickening and pericholecystic edema, suggesting acute cholecystitis, with a distal common bile duct stone and mild bilary dilatation.  Small right pleural effusion.  Small right inguinal hernia containing fat.   Patient placed on IV fluids and IV antibiotic therapy NPO  Consulted GI for possible ERCP 11/28 patient with improved abdominal pain, liver enzymes and bilirubins improving.  Laparoscopic cholecystectomy   11/29 resuming apixaban .   Assessment and Plan: * Acute gallstone pancreatitis Gallstone pancreatitis and cholecystitis.  Elevated liver enzymes due to choledocholithiasis   11/28 laparoscopic cholecystectomy, due to inflammation not performed intra operative cholangiogram.  Wbc  is 11.3, AST and ALT trending down to 58 and 164 respectively, total bilirubin down to 1,4  Patient tolerating well regular diet.  Patient has been on IV antibiotic therapy with Ceftriaxone  and metronidazole , surgery recommended 4 days of antibiotics post op, will plan to change to po ciprofloxacin  and metronidazole .   As needed analgesics and antiemetics Scheduled proton pump inhibitor.  Follow up cell count and temperature curve.   HTN (hypertension) Continue to hold on entresto  for now, to prevent hypotension Continue blood pressure monitoring   Chronic systolic CHF (congestive heart failure) (HCC) Echocardiogram with reduced LV systolic function 30 to 35%, global hypokinesis, RV systolic function preserved, RVSP 65.1 mmHg, LA with moderate dilatation, no significant valvular disease.   No signs of decompensation Plan to hold on entersto for now due to risk of hypotension.   Paroxysmal atrial fibrillation (HCC) Patient on sinus rhythm with frequent PVC and PAC.  He is not on AV blockade at home  Plan to resume anticoagulation this evening.  Continue telemetry monitoring   Hyponatremia Stable renal function and electrolytes, with serum cr at 1,0 with K at 3,9 and serum bicarbonate at 22 Na 133   Follow up renal function and electrolytes in am.  Diet advanced to regular.   CML (chronic myelocytic leukemia) (HCC) Cell count stable, plan to follow up as outpatient.  Subjective: Patient with no chest pain and no dyspnea, posit operative abdominal pain is controlled and mild in intensity.   Physical Exam: Vitals:   09/22/24 1556 09/22/24 2042 09/23/24 0425 09/23/24 0747  BP: 124/74  116/70 121/67 124/72  Pulse: 87 73 72 84  Resp: 18 16 18 17   Temp: (!) 97.5 F (36.4 C) 98.3 F (36.8 C) 98.2 F (36.8 C) 97.7 F (36.5 C)  TempSrc: Oral Oral Oral Oral  SpO2: 97% 98% 97% 99%  Weight:      Height:       Neurology awake and alert ENT with no pallor or icterus Cardiovascular with S1 and S2 present and regular, with no gallops or rubs, positive extra beats Respiratory with no rales or wheezing, no rhonchi, abdomen with no distention, soft and non tender to superficial palpation   No lower extremity edema.   Data Reviewed:    Family Communication: no family at the bedside   Disposition: Status is: Inpatient Remains inpatient appropriate because: recovering cholecystitis, plan for dc home tomorrow   Planned Discharge Destination: Home     Author: Elidia Toribio Furnace, MD 09/23/2024 2:17 PM  For on call review [email or web address].

## 2024-09-23 NOTE — Progress Notes (Addendum)
 1 Day Post-Op   Subjective/Chief Complaint: Feels well this morning, pain is well-controlled, only the very slightest bit of nausea.  Tolerating regular diet without any issue.  Objective: Vital signs in last 24 hours: Temp:  [97.5 F (36.4 C)-98.3 F (36.8 C)] 97.7 F (36.5 C) (11/29 0747) Pulse Rate:  [72-100] 84 (11/29 0747) Resp:  [12-21] 17 (11/29 0747) BP: (116-153)/(60-87) 124/72 (11/29 0747) SpO2:  [88 %-99 %] 99 % (11/29 0747) Weight:  [99.8 kg] (P) 99.8 kg (11/28 1053) Last BM Date : 09/22/24  Intake/Output from previous day: 11/28 0701 - 11/29 0700 In: 1600.5 [P.O.:360; I.V.:900; IV Piggyback:340.5] Out: -  Intake/Output this shift: Total I/O In: 360 [P.O.:360] Out: -   Alert, well-appearing Unlabored respirations Abdomen is soft, nondistended, appropriately tender.  Evolving ecchymosis around the epigastric port site but no cellulitis or hematoma.  Lab Results:  Recent Labs    09/22/24 0528 09/23/24 0431  WBC 11.2* 11.3*  HGB 11.8* 11.9*  HCT 35.0* 34.9*  PLT 186 192   BMET Recent Labs    09/22/24 0528 09/23/24 0431  NA 133* 133*  K 3.7 3.9  CL 100 101  CO2 21* 22  GLUCOSE 76 103*  BUN 13 12  CREATININE 0.90 1.02  CALCIUM 8.3* 8.0*   PT/INR Recent Labs    09/22/24 0528  LABPROT 16.7*  INR 1.3*   ABG No results for input(s): PHART, HCO3 in the last 72 hours.  Invalid input(s): PCO2, PO2  Studies/Results: ECHOCARDIOGRAM LIMITED Result Date: 09/21/2024    ECHOCARDIOGRAM LIMITED REPORT   Patient Name:   JADD GASIOR Date of Exam: 09/21/2024 Medical Rec #:  [id number]     Height:       74.0 in Accession #:    [id number]    Weight:       220.0 lb Date of Birth:  1941/01/01    BSA:          2.263 m Patient Age:    83 years      BP:           113/65 mmHg Patient Gender: M             HR:           61 bpm. Exam Location:  Inpatient Procedure: Limited Echo, Cardiac Doppler and Color Doppler (Both Spectral and            Color Flow  Doppler were utilized during procedure). Indications:    Pre-operative cardiovascular examination Z01.810  History:        Patient has prior history of Echocardiogram examinations, most                 recent 09/26/2019. Arrythmias:Atrial Fibrillation; Risk                 Factors:Hypertension.  Sonographer:    Aida Pizza RCS Referring Phys: [id number] MAURICIO DANIEL ARRIEN IMPRESSIONS  1. Left ventricular ejection fraction, by estimation, is 30 to 35%. The left ventricle has moderately decreased function. The left ventricle demonstrates global hypokinesis. Left ventricular diastolic parameters are indeterminate. Elevated left ventricular end-diastolic pressure.  2. Right ventricular systolic function is normal. The right ventricular size is normal. There is severely elevated pulmonary artery systolic pressure. The estimated right ventricular systolic pressure is 65.1 mmHg.  3. Left atrial size was moderately dilated.  4. The mitral valve is normal in structure. Trivial mitral valve regurgitation. No evidence of mitral stenosis.  5. The aortic valve is  normal in structure. There is mild calcification of the aortic valve. Aortic valve regurgitation is not visualized. Aortic valve sclerosis/calcification is present, without any evidence of aortic stenosis.  6. The inferior vena cava is dilated in size with <50% respiratory variability, suggesting right atrial pressure of 15 mmHg. FINDINGS  Left Ventricle: Left ventricular ejection fraction, by estimation, is 30 to 35%. The left ventricle has moderately decreased function. The left ventricle demonstrates global hypokinesis. The left ventricular internal cavity size was normal in size. There is no left ventricular hypertrophy. Left ventricular diastolic parameters are indeterminate. Elevated left ventricular end-diastolic pressure. Right Ventricle: The right ventricular size is normal. No increase in right ventricular wall thickness. Right ventricular systolic function  is normal. There is severely elevated pulmonary artery systolic pressure. The tricuspid regurgitant velocity is 3.54 m/s, and with an assumed right atrial pressure of 15 mmHg, the estimated right ventricular systolic pressure is 65.1 mmHg. Left Atrium: Left atrial size was moderately dilated. Right Atrium: Right atrial size was normal in size. Pericardium: There is no evidence of pericardial effusion. Mitral Valve: The mitral valve is normal in structure. Trivial mitral valve regurgitation. No evidence of mitral valve stenosis. Tricuspid Valve: The tricuspid valve is normal in structure. Tricuspid valve regurgitation is mild . No evidence of tricuspid stenosis. Aortic Valve: The aortic valve is normal in structure. There is mild calcification of the aortic valve. Aortic valve regurgitation is not visualized. Aortic valve sclerosis/calcification is present, without any evidence of aortic stenosis. Pulmonic Valve: The pulmonic valve was normal in structure. Pulmonic valve regurgitation is trivial. No evidence of pulmonic stenosis. Aorta: The aortic root is normal in size and structure. Venous: The inferior vena cava is dilated in size with less than 50% respiratory variability, suggesting right atrial pressure of 15 mmHg. IAS/Shunts: No atrial level shunt detected by color flow Doppler. LEFT VENTRICLE PLAX 2D LVIDd:         6.20 cm LVIDs:         4.30 cm LV PW:         1.00 cm LV IVS:        1.00 cm LVOT diam:     2.30 cm LVOT Area:     4.15 cm  RIGHT VENTRICLE TAPSE (M-mode): 2.5 cm LEFT ATRIUM         Index LA diam:    5.40 cm 2.39 cm/m   AORTA Ao Root diam: 3.30 cm MITRAL VALVE                TRICUSPID VALVE MV Area (PHT): 4.60 cm     TR Peak grad:   50.1 mmHg MV Decel Time: 165 msec     TR Vmax:        354.00 cm/s MV E velocity: 114.00 cm/s MV A velocity: 54.80 cm/s   SHUNTS MV E/A ratio:  2.08         Systemic Diam: 2.30 cm Toribio Fuel MD Electronically signed by Toribio Fuel MD Signature Date/Time:  09/21/2024/2:11:02 PM    Final     Anti-infectives: Anti-infectives (From admission, onward)    Start     Dose/Rate Route Frequency Ordered Stop   09/21/24 1000  cefTRIAXone  (ROCEPHIN ) 2 g in sodium chloride  0.9 % 100 mL IVPB        2 g 200 mL/hr over 30 Minutes Intravenous Daily 09/21/24 0633     09/21/24 0645  metroNIDAZOLE  (FLAGYL ) IVPB 500 mg        500 mg 100  mL/hr over 60 Minutes Intravenous 2 times daily 09/21/24 9366     09/20/24 2200  ciprofloxacin  (CIPRO ) IVPB 400 mg       Placed in And Linked Group   400 mg 200 mL/hr over 60 Minutes Intravenous  Once 09/20/24 2158 09/20/24 2335   09/20/24 2200  metroNIDAZOLE  (FLAGYL ) IVPB 500 mg       Placed in And Linked Group   500 mg 100 mL/hr over 60 Minutes Intravenous  Once 09/20/24 2158 09/21/24 0038       Assessment/Plan:  83 year old male with history of A-fib on Eliquis  (held since admission) and 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 Polly with intraoperative findings notable for significant inflammation consistent with acute cholecystitis, anatomy not amenable to cholangiogram - LFTs continue to improve this morning and white count/hemoglobin stable - Appears to have an ultrasound from this morning, unclear why this was done -Okay to resume Eliquis  this evening - Okay from surgery standpoint for discharge home today.   LOS: 2 days    Lance Hubbard 09/23/2024

## 2024-09-23 NOTE — Plan of Care (Signed)

## 2024-09-23 NOTE — Discharge Instructions (Signed)
 [facility] ______[facility] LAPAROSCOPIC SURGERY: POST OP INSTRUCTIONS Always review your discharge instruction sheet given to you by the facility where your surgery was performed. IF YOU HAVE DISABILITY OR FAMILY LEAVE FORMS, YOU MUST BRING THEM TO THE OFFICE FOR PROCESSING.   DO NOT GIVE THEM TO YOUR DOCTOR.  A prescription for pain medication may be given to you upon discharge.  Take your pain medication as prescribed, if needed.  If narcotic pain medicine is not needed, then you may take acetaminophen  (Tylenol ) or ibuprofen  (Advil ) as needed. Take your usually prescribed medications unless otherwise directed. If you need a refill on your pain medication, please contact your pharmacy.  They will contact our office to request authorization. Prescriptions will not be filled after 5pm or on week-ends. You should follow a light diet the first few days after arrival home, such as soup and crackers, etc.  Be sure to include lots of fluids daily. Most patients will experience some swelling and bruising in the area of the incisions.  Ice packs will help.  Swelling and bruising can take several days to resolve.  It is common to experience some constipation if taking pain medication after surgery.  Increasing fluid intake and taking a stool softener (such as Colace) will usually help or prevent this problem from occurring.  A mild laxative (Milk of Magnesia or Miralax) should be taken according to package instructions if there are no bowel movements after 48 hours. Unless discharge instructions indicate otherwise, you may remove your bandages 24-48 hours after surgery, and you may shower at that time.  You may have steri-strips (small skin tapes) in place directly over the incision.  These strips should be left on the skin for 7-10 days.  If your surgeon used skin glue on the incision, you may shower in 24 hours.  The glue will flake off over the next 2-3 weeks.  Any sutures or staples will be  removed at the office during your follow-up visit. ACTIVITIES:  You may resume regular (light) daily activities beginning the next day--such as daily self-care, walking, climbing stairs--gradually increasing activities as tolerated.  You may have sexual intercourse when it is comfortable.  Refrain from any heavy lifting or straining until approved by your doctor. You may drive when you are no longer taking prescription pain medication, you can comfortably wear a seatbelt, and you can safely maneuver your car and apply brakes. RETURN TO WORK:  __________________________________________________________ Lance Hubbard should see your doctor in the office for a follow-up appointment approximately 2-3 weeks after your surgery.  Make sure that you call for this appointment within a day or two after you arrive home to insure a convenient appointment time. OTHER INSTRUCTIONS: __________________________________________________________________________________________________________________________ __________________________________________________________________________________________________________________________ WHEN TO CALL YOUR DOCTOR: Fever over 101.0 Inability to urinate Continued bleeding from incision. Increased pain, redness, or drainage from the incision. Increasing abdominal pain  The clinic staff is available to answer your questions during regular business hours.  Please don't hesitate to call and ask to speak to one of the nurses for clinical concerns.  If you have a medical emergency, go to the nearest emergency room or call 911.  A surgeon from [facility] is always on call at the hospital. [address], [address], [place], [place]  [address] ? [address] [address], [place], [place]   [address] [phone] ? [phone] ? FAX [phone] Web site: [email or web address]

## 2024-09-24 ENCOUNTER — Other Ambulatory Visit (HOSPITAL_COMMUNITY): Payer: Self-pay

## 2024-09-24 DIAGNOSIS — K851 Biliary acute pancreatitis without necrosis or infection: Secondary | ICD-10-CM | POA: Diagnosis not present

## 2024-09-24 DIAGNOSIS — I159 Secondary hypertension, unspecified: Secondary | ICD-10-CM | POA: Diagnosis not present

## 2024-09-24 DIAGNOSIS — I5022 Chronic systolic (congestive) heart failure: Secondary | ICD-10-CM | POA: Diagnosis not present

## 2024-09-24 DIAGNOSIS — I480 Paroxysmal atrial fibrillation: Secondary | ICD-10-CM | POA: Diagnosis not present

## 2024-09-24 DIAGNOSIS — E039 Hypothyroidism, unspecified: Secondary | ICD-10-CM

## 2024-09-24 LAB — CBC
HCT: 34.2 % — ABNORMAL LOW (ref 39.0–52.0)
Hemoglobin: 11.7 g/dL — ABNORMAL LOW (ref 13.0–17.0)
MCH: 29.8 pg (ref 26.0–34.0)
MCHC: 34.2 g/dL (ref 30.0–36.0)
MCV: 87.2 fL (ref 80.0–100.0)
Platelets: 182 K/uL (ref 150–400)
RBC: 3.92 MIL/uL — ABNORMAL LOW (ref 4.22–5.81)
RDW: 14 % (ref 11.5–15.5)
WBC: 11.5 K/uL — ABNORMAL HIGH (ref 4.0–10.5)
nRBC: 0 % (ref 0.0–0.2)

## 2024-09-24 LAB — BASIC METABOLIC PANEL WITH GFR
Anion gap: 8 (ref 5–15)
BUN: 13 mg/dL (ref 8–23)
CO2: 22 mmol/L (ref 22–32)
Calcium: 8 mg/dL — ABNORMAL LOW (ref 8.9–10.3)
Chloride: 103 mmol/L (ref 98–111)
Creatinine, Ser: 0.89 mg/dL (ref 0.61–1.24)
GFR, Estimated: >60 mL/min (ref >60)
Glucose, Bld: 107 mg/dL — ABNORMAL HIGH (ref 70–99)
Potassium: 3.8 mmol/L (ref 3.5–5.1)
Sodium: 133 mmol/L — ABNORMAL LOW (ref 135–145)

## 2024-09-24 LAB — MAGNESIUM: Magnesium: 2 mg/dL (ref 1.7–2.4)

## 2024-09-24 MED ORDER — CIPROFLOXACIN HCL 500 MG PO TABS
500.0000 mg | ORAL_TABLET | Freq: Two times a day (BID) | ORAL | 0 refills | Status: DC
  Filled 2024-09-24: qty 6, 3d supply, fill #0

## 2024-09-24 MED ORDER — DOCUSATE SODIUM 100 MG PO CAPS
100.0000 mg | ORAL_CAPSULE | Freq: Two times a day (BID) | ORAL | 0 refills | Status: DC
  Filled 2024-09-24: qty 30, 15d supply, fill #0

## 2024-09-24 MED ORDER — ACETAMINOPHEN 325 MG PO TABS: 650.0000 mg | ORAL_TABLET | Freq: Four times a day (QID) | ORAL | Status: AC | PRN

## 2024-09-24 MED ORDER — OXYCODONE HCL 5 MG PO CAPS
5.0000 mg | ORAL_CAPSULE | Freq: Three times a day (TID) | ORAL | 0 refills | Status: DC | PRN
  Filled 2024-09-24: qty 15, 5d supply, fill #0

## 2024-09-24 MED ORDER — OXYCODONE HCL 5 MG PO TABS
5.0000 mg | ORAL_TABLET | Freq: Three times a day (TID) | ORAL | 0 refills | Status: DC | PRN
  Filled 2024-09-24: qty 10, 4d supply, fill #0

## 2024-09-24 MED ORDER — METRONIDAZOLE 500 MG PO TABS
500.0000 mg | ORAL_TABLET | Freq: Two times a day (BID) | ORAL | 0 refills | Status: DC
  Filled 2024-09-24: qty 6, 3d supply, fill #0

## 2024-09-24 MED ORDER — INFLUENZA VAC SPLIT HIGH-DOSE 0.5 ML IM SUSY
0.5000 mL | PREFILLED_SYRINGE | INTRAMUSCULAR | Status: AC
  Administered 2024-09-24: 0.5 mL via INTRAMUSCULAR
  Filled 2024-09-24: qty 0.5

## 2024-09-24 NOTE — Discharge Summary (Addendum)
 Physician Discharge Summary   Patient: Lance Hubbard MRN: [id number] DOB: 06-19-1941  Admit date:     09/20/2024  Discharge date: 09/24/24  Discharge Physician: Lance Hubbard   PCP: Lance Charleston, MD   Recommendations at discharge:   Patient will continue antibiotic therapy with ciprofloxacin  and metronidazole  for 3 more days.  Follow up echocardiogram in 3 months, if persistent low EF, may need to up escalate guideline directed medical therapy.  Follow up completed metabolic panel in 7 days as outpatient.  Follow up with Dr Lance in 7 to 10 days. Follow up with Surgery as scheduled.   Follow up with Cardiology as scheduled.   Discharge Diagnoses: Principal Problem:   Acute gallstone pancreatitis Active Problems:   HTN (hypertension)   Chronic systolic CHF (congestive heart failure) (HCC)   Paroxysmal atrial fibrillation (HCC)   Hyponatremia   CML (chronic myelocytic leukemia) (HCC)   Choledocholithiasis   Elevated liver enzymes   Anticoagulated  Resolved Problems:   * No resolved hospital problems. [facility] Course: [name] was admitted to the hospital with the working diagnosis of acute gallstone pancreatitis.   83 yo male with the past medical history of hypertension, CML, hypothyroidism, hyperlipidemia and atrial fibrillation who presented with abdominal pain, nausea and vomiting. Reported intermittent epigastric abdominal pain for the last 6 weeks, but this time pain was more persistent, severe and associated with nausea and vomiting, prompting him to come to the ed.  On his initial physical examination his blood pressure was 148/75, HR 73, RR 18 and 02 saturation 100% on room air Lungs with no wheezing or rhonchi, heart with S1 and S2 present and regular, abdomen with tenderness to palpation at the epigastrium with no rebound or guarding, no lower extremity edema.   Na 130, K 4.0 Cl 94 bicarbonate 26 glucose 118, bun 16 cr 1,17  ALK P 192, lipase >  2,800  AST 220 ALT 399 total bilirubin 3,6  Wbc 10,9 hgb 13.5 plt 222  Urine analysis SG 1,025, protein trace, negative glucose, negative leukocytes, negative hgb.   EKG 82 bpm, normal axis, normal intervals, qtc 450, sinus rhythm with PAC and PVH, with no significant ST segment or T wave changes.   CT abdomen and pelvis with acute pancreatitis without pancreatic ductal dilatation or loculated collections.  Cholelithiasis with several gallbladder stone, mild gallbladder wall thickening and pericholecystic edema, suggesting acute cholecystitis, with a distal common bile duct stone and mild bilary dilatation.  Small right pleural effusion.  Small right inguinal hernia containing fat.   Patient placed on IV fluids and IV antibiotic therapy NPO  Consulted GI for possible ERCP 11/28 patient with improved abdominal pain, liver enzymes and bilirubins improving.  Laparoscopic cholecystectomy  11/29 resuming apixaban . No clinical signs of choledocholithiasis, possible stone had pass. Follow up abdominal US  with common bile duct diameter of 3 mm.  11/30 patient clinically stable for discharge home.   Assessment and Plan: * Acute gallstone pancreatitis Gallstone pancreatitis and cholecystitis.  Elevated liver enzymes due to choledocholithiasis   11/28 laparoscopic cholecystectomy, due to inflammation not performed intra operative cholangiogram.  Wbc  is 11.5, AST and ALT trending down to 58 and 164 respectively, total bilirubin down to 1,4  Patient tolerating well regular diet. Follow up liver US  with common bile duct diameter measuring 3 mm.   Patient has been on IV antibiotic therapy with Ceftriaxone  and metronidazole , surgery recommended 4 days of antibiotics post op, will plan to continue with  po ciprofloxacin  and metronidazole .   Continue pain control with acetaminophen  and oxycodone  as needed.   HTN (hypertension) At the time of discharge patient will resume taking entresto  for blood  pressure control.  This agent was held during his hospitalization to prevent hypotension.   Chronic systolic CHF (congestive heart failure) (HCC) Echocardiogram with reduced LV systolic function 30 to 35%, global hypokinesis, RV systolic function preserved, RVSP 65.1 mmHg, LA with moderate dilatation, no significant valvular disease.   No signs of decompensation Patient will resume entresto  for heart failure.  Will need repeat echocardiogram in 3 months, if persistent low EF, may need up escalation of guideline directed medical therapy.   Paroxysmal atrial fibrillation (HCC) Patient on sinus rhythm with frequent PVC and PAC.  He is not on AV blockade at home  Patient had apixaban  held pre op, with good toleration. Now anticoagulation has been resumed.  Plan to continue anticoagulation at home.   Hyponatremia Patient had gentle IV fluids pre operative, at the time of his discharge his serum cr is 0,89 with K at 3,8 and serum bicarbonate at 22 Na 133 and Mg 2,9   Plan to follow up renal function and electrolytes as outpatient.   Hypothyroidism Continue levothyroxine .   CML (chronic myelocytic leukemia) (HCC) Cell count stable, plan to follow up as outpatient.  Resume bosutinib.       Consultants: surgery and GI  Procedures performed: laparoscopic cholecystectomy   Disposition: Home Diet recommendation:  Cardiac diet DISCHARGE MEDICATION: Allergies as of 09/24/2024       Reactions   Amoxicillin Hives, Other (See Comments)   REACTION: whelps/pain in knees and ankles   Penicillin G    Other reaction(s): hives        Medication List     TAKE these medications    acetaminophen  325 MG tablet Commonly known as: TYLENOL  Take 2 tablets (650 mg total) by mouth every 6 (six) hours as needed for mild pain (pain score 1-3) or moderate pain (pain score 4-6). What changed:  medication strength how much to take   apixaban  5 MG Tabs tablet Commonly known as: ELIQUIS  Take  1 tablet (5 mg total) by mouth every 12 (twelve) hours.   Bosulif 400 MG tablet Generic drug: bosutinib Take 400 mg by mouth every evening.   ciprofloxacin  500 MG tablet Commonly known as: CIPRO  Take 1 tablet (500 mg total) by mouth 2 (two) times daily for 3 days.   clobetasol  cream 0.05 % Commonly known as: TEMOVATE  Apply 1 application topically 2 (two) times daily as needed (rash).   docusate sodium 100 MG capsule Commonly known as: Colace Take 1 capsule (100 mg total) by mouth 2 (two) times daily. Okay to decrease to once daily or stop taking if having loose bowel movements   Entresto  49-51 MG Generic drug: sacubitril-valsartan Take 0.5 tablets by mouth 2 (two) times daily.   levothyroxine  25 MCG tablet Commonly known as: SYNTHROID  Take 25 mcg by mouth daily before breakfast.   metroNIDAZOLE  500 MG tablet Commonly known as: FLAGYL  Take 1 tablet (500 mg total) by mouth every 12 (twelve) hours for 3 days.   oxyCODONE  5 MG immediate release tablet Commonly known as: Oxy IR/ROXICODONE  Take 1 tablet (5 mg total) by mouth every 8 (eight) hours as needed for severe pain (pain score 7-10) (For pain not relieved with Tylenol , ibuprofen, rest, ice,).   rosuvastatin 5 MG tablet Commonly known as: CRESTOR Take 5 mg by mouth at bedtime. What changed: when  to take this        Follow-up Information     Maczis, Puja Gosai, PA-C Follow up in 2 week(s).   Specialty: General Surgery Why: Our office should reach out to you to set up a postop check.  If you do not hear from us  by Monday afternoon, please call to arrange an appointment at a time that is convenient for you. Contact information: [address] [facility] [place] [address] [phone]                Discharge Exam: Filed Weights   09/20/24 1933 09/22/24 1053  Weight: 99.8 kg (P) 99.8 kg   BP (!) 143/72 (BP Location: Right Arm)   Pulse 78   Temp (!) 97.5 F (36.4 C)  (Axillary)   Resp 16   Ht (P) 6' 2 (1.88 m)   Wt (P) 99.8 kg   SpO2 98%   BMI (P) 28.25 kg/m   Patient with no chest pain and no dyspnea, no PND, orthopnea or lower extremity edema.  No abdominal pain, no nausea or vomiting.   Neurology awake and alert ENT with no pallor or icterus Cardiovascular with S1 and S2 present and regular with no gallops, rubs or murmurs Respiratory with no rales or wheezing, no rhonchi  Abdomen is not distended, soft and not tender to superficial palpation  No lower extremity edema.   Condition at discharge: stable  The results of significant diagnostics from this hospitalization (including imaging, microbiology, ancillary and laboratory) are listed below for reference.   Imaging Studies: US  Abdomen Limited RUQ (LIVER/GB) Result Date: 09/23/2024 CLINICAL DATA:  Elevated liver function tests status post cholecystectomy EXAM: ULTRASOUND ABDOMEN LIMITED RIGHT UPPER QUADRANT COMPARISON:  CT abdomen and pelvis dated 09/20/2024 FINDINGS: Gallbladder: Status post cholecystectomy. Small volume fluid at the cholecystectomy site. 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 Portal vein is patent on color Doppler imaging with normal direction of blood flow towards the liver. Other: Partially imaged right pleural effusion. IMPRESSION: 1. Status post cholecystectomy with small volume fluid at the cholecystectomy site. 2. Partially imaged right pleural effusion. Electronically Signed   By: Limin  Xu M.D.   On: 09/23/2024 13:19   ECHOCARDIOGRAM LIMITED Result Date: 09/21/2024    ECHOCARDIOGRAM LIMITED REPORT   Patient Name:   Lance Hubbard Date of Exam: 09/21/2024 Medical Rec #:  [id number]     Height:       74.0 in Accession #:    [id number]    Weight:       220.0 lb Date of Birth:  1941-04-24    BSA:          2.263 m Patient Age:    82 years      BP:           113/65 mmHg Patient Gender: M             HR:           61 bpm. Exam  Location:  Inpatient Procedure: Limited Echo, Cardiac Doppler and Color Doppler (Both Spectral and            Color Flow Doppler were utilized during procedure). Indications:    Pre-operative cardiovascular examination Z01.810  History:        Patient has prior history of Echocardiogram examinations, most  recent 09/26/2019. Arrythmias:Atrial Fibrillation; Risk                 Factors:Hypertension.  Sonographer:    Aida Pizza RCS Referring Phys: [id number] Canyon Lohr DANIEL Claudy Abdallah IMPRESSIONS  1. Left ventricular ejection fraction, by estimation, is 30 to 35%. The left ventricle has moderately decreased function. The left ventricle demonstrates global hypokinesis. Left ventricular diastolic parameters are indeterminate. Elevated left ventricular end-diastolic pressure.  2. Right ventricular systolic function is normal. The right ventricular size is normal. There is severely elevated pulmonary artery systolic pressure. The estimated right ventricular systolic pressure is 65.1 mmHg.  3. Left atrial size was moderately dilated.  4. The mitral valve is normal in structure. Trivial mitral valve regurgitation. No evidence of mitral stenosis.  5. The aortic valve is normal in structure. There is mild calcification of the aortic valve. Aortic valve regurgitation is not visualized. Aortic valve sclerosis/calcification is present, without any evidence of aortic stenosis.  6. The inferior vena cava is dilated in size with <50% respiratory variability, suggesting right atrial pressure of 15 mmHg. FINDINGS  Left Ventricle: Left ventricular ejection fraction, by estimation, is 30 to 35%. The left ventricle has moderately decreased function. The left ventricle demonstrates global hypokinesis. The left ventricular internal cavity size was normal in size. There is no left ventricular hypertrophy. Left ventricular diastolic parameters are indeterminate. Elevated left ventricular end-diastolic pressure. Right Ventricle:  The right ventricular size is normal. No increase in right ventricular wall thickness. Right ventricular systolic function is normal. There is severely elevated pulmonary artery systolic pressure. The tricuspid regurgitant velocity is 3.54 m/s, and with an assumed right atrial pressure of 15 mmHg, the estimated right ventricular systolic pressure is 65.1 mmHg. Left Atrium: Left atrial size was moderately dilated. Right Atrium: Right atrial size was normal in size. Pericardium: There is no evidence of pericardial effusion. Mitral Valve: The mitral valve is normal in structure. Trivial mitral valve regurgitation. No evidence of mitral valve stenosis. Tricuspid Valve: The tricuspid valve is normal in structure. Tricuspid valve regurgitation is mild . No evidence of tricuspid stenosis. Aortic Valve: The aortic valve is normal in structure. There is mild calcification of the aortic valve. Aortic valve regurgitation is not visualized. Aortic valve sclerosis/calcification is present, without any evidence of aortic stenosis. Pulmonic Valve: The pulmonic valve was normal in structure. Pulmonic valve regurgitation is trivial. No evidence of pulmonic stenosis. Aorta: The aortic root is normal in size and structure. Venous: The inferior vena cava is dilated in size with less than 50% respiratory variability, suggesting right atrial pressure of 15 mmHg. IAS/Shunts: No atrial level shunt detected by color flow Doppler. LEFT VENTRICLE PLAX 2D LVIDd:         6.20 cm LVIDs:         4.30 cm LV PW:         1.00 cm LV IVS:        1.00 cm LVOT diam:     2.30 cm LVOT Area:     4.15 cm  RIGHT VENTRICLE TAPSE (M-mode): 2.5 cm LEFT ATRIUM         Index LA diam:    5.40 cm 2.39 cm/m   AORTA Ao Root diam: 3.30 cm MITRAL VALVE                TRICUSPID VALVE MV Area (PHT): 4.60 cm     TR Peak grad:   50.1 mmHg MV Decel Time: 165 msec     TR Vmax:  354.00 cm/s MV E velocity: 114.00 cm/s MV A velocity: 54.80 cm/s   SHUNTS MV E/A ratio:   2.08         Systemic Diam: 2.30 cm Toribio Fuel MD Electronically signed by Toribio Fuel MD Signature Date/Time: 09/21/2024/2:11:02 PM    Final    CT ABDOMEN PELVIS W CONTRAST Result Date: 09/20/2024 EXAM: CT ABDOMEN AND PELVIS WITH CONTRAST 09/20/2024 09:23:03 PM TECHNIQUE: CT of the abdomen and pelvis was performed with the administration of 100 mL of iohexol  (OMNIPAQUE ) 300 MG/ML solution. Multiplanar reformatted images are provided for review. Automated exposure control, iterative reconstruction, and/or weight-based adjustment of the mA/kV was utilized to reduce the radiation dose to as low as reasonably achievable. COMPARISON: Comparison with 03/26/2005. CLINICAL HISTORY: Bowel obstruction suspected; upper abd pain, constipation. FINDINGS: LOWER CHEST: Small right pleural effusion with basilar atelectasis or consolidation. Small esophageal hiatal hernia. LIVER: Circumscribed cyst in the left lobe of the liver. No imaging follow-up is indicated. GALLBLADDER AND BILE DUCTS: Cholelithiasis with several stones in the gallbladder. Mild gallbladder wall thickening and pericholecystic edema. Changes may indicate acute cholecystitis in the appropriate clinical setting. There is a common duct stone in the distal common bile duct with mild bile duct dilatation. SPLEEN: Spleen is normal. PANCREAS: Infiltration and edema around the pancreas consistent with acute pancreatitis. No pancreatic ductal dilatation. No loculated collections. ADRENAL GLANDS: Adrenal glands are normal. KIDNEYS, URETERS AND BLADDER: The kidneys are normal. No stones in the kidneys or ureters. No hydronephrosis. No perinephric or periureteral stranding. Bladder is normal. GI AND BOWEL: Stomach demonstrates no acute abnormality. There is no bowel obstruction. PERITONEUM AND RETROPERITONEUM: No ascites. No free air. VASCULATURE: Calcification of the aorta. No aneurysm. LYMPH NODES: No significant lymphadenopathy. REPRODUCTIVE ORGANS:  Prostate gland is markedly enlarged, measuring 7 cm in diameter. BONES AND SOFT TISSUES: Degenerative changes in the spine. Small right inguinal hernia containing fat. No acute osseous abnormality. No focal soft tissue abnormality. IMPRESSION: 1. Findings consistent with acute pancreatitis without pancreatic ductal dilatation or loculated collections. 2. Cholelithiasis with several gallbladder stones, mild gallbladder wall thickening, and pericholecystic edema, suggestive of acute cholecystitis, with a distal common bile duct stone and mild biliary dilatation. 3. Small right pleural effusion with basilar atelectasis or consolidation. 4. Small right inguinal hernia containing fat. Electronically signed by: Elsie Gravely MD 09/20/2024 09:31 PM EST RP Workstation: [id number]    Microbiology: Results for orders placed or performed during the hospital encounter of 09/20/24  Surgical pcr screen     Status: None   Collection Time: 09/22/24  8:58 AM   Specimen: Nasal Mucosa; Nasal Swab  Result Value Ref Range Status   MRSA, PCR NEGATIVE NEGATIVE Final   Staphylococcus aureus NEGATIVE NEGATIVE Final    Comment: (NOTE) The Xpert SA Assay (FDA approved for NASAL specimens in patients 21 years of age and older), is one component of a comprehensive surveillance program. It is not intended to diagnose infection nor to guide or monitor treatment. Performed at [facility], [address], [place], [place] [address]     Labs: CBC: Recent Labs  Lab 09/20/24 1936 09/21/24 0720 09/22/24 0528 09/23/24 0431 09/24/24 0318  WBC 10.9* 8.0 11.2* 11.3* 11.5*  NEUTROABS  --  5.6  --   --   --   HGB 13.4 12.7* 11.8* 11.9* 11.7*  HCT 39.7 37.9* 35.0* 34.9* 34.2*  MCV 87.3 87.1 86.4 86.6 87.2  PLT 222 184 186 192 182   Basic Metabolic Panel: Recent  Labs  Lab 09/20/24 1936 09/20/24 2016 09/21/24 0720 09/22/24 0528 09/23/24 0431 09/24/24 0318  NA 130*  --  133* 133* 133* 133*  K 4.0  --  3.9  3.7 3.9 3.8  CL 94*  --  98 100 101 103  CO2 26  --  23 21* 22 22  GLUCOSE 118*  --  83 76 103* 107*  BUN 16  --  12 13 12 13   CREATININE 1.17  --  1.07 0.90 1.02 0.89  CALCIUM 9.7  --  8.5* 8.3* 8.0* 8.0*  MG  --  2.1  --  2.0 2.0 2.0   Liver Function Tests: Recent Labs  Lab 09/20/24 1936 09/21/24 0720 09/22/24 0528 09/23/24 0431  AST 220* 126* 72* 58*  ALT 399* 265* 196* 164*  ALKPHOS 192* 149* 136* 110  BILITOT 3.6* 4.1* 1.5* 1.4*  PROT 7.8 6.5 6.0* 5.9*  ALBUMIN 4.4 3.6 3.3* 3.2*   CBG: No results for input(s): GLUCAP in the last 168 hours.  Discharge time spent: greater than 30 minutes.  Signed: Elidia Toribio Furnace, MD [facility] 09/24/2024

## 2024-09-24 NOTE — Evaluation (Signed)
 Physical Therapy Brief Evaluation and Discharge Note Patient Details Name: Lance Hubbard MRN: [id number] DOB: 06-19-1941 Today's Date: 09/24/2024   History of Present Illness  Pt is 83 year old presented to [facility] on  09/20/24 for abd pain with nausea and vomiting. Pt with acute gallston pancreatitis. Underwent laparoscopic cholecystectomy on 11/28. PMH - CHF, HTN, CML, hypothyroidism, afib.  Clinical Impression  Pt doing well with mobility and no further PT needed.  Ready for dc from PT standpoint.        PT Assessment Patient does not need any further PT services  Assistance Needed at Discharge  None    Equipment Recommendations None recommended by PT  Recommendations for Other Services       Precautions/Restrictions Precautions Precautions: None Restrictions Weight Bearing Restrictions Per Provider Order: No        Mobility  Bed Mobility Rolling: Independent Supine/Sidelying to sit: Independent Sit to supine/sidelying: Independent    Transfers Overall transfer level: Modified independent Equipment used: None                    Ambulation/Gait Ambulation/Gait assistance: Modified independent (Device/Increase time) Gait Distance (Feet): 450 Feet Assistive device: Straight cane Gait Pattern/deviations: Step-through pattern, Decreased stride length Gait Speed: Below normal    Home Activity Instructions    Stairs            Modified Rankin (Stroke Patients Only)        Balance Overall balance assessment: Mild deficits observed, not formally tested                        Pertinent Vitals/Pain PT - Brief Vital Signs All Vital Signs Stable: Yes Pain Assessment Pain Assessment: No/denies pain     Home Living Family/patient expects to be discharged to:: Private residence Living Arrangements: Spouse/significant other Available Help at Discharge: Family;Available 24 hours/day Home Environment: Stairs to enter;Rail - left;Stairs  in home (stays on main level)  Stairs-Number of Steps: 3 Home Equipment: Cane - single point        Prior Function Level of Independence: Independent with assistive device(s) Comments: Uses cane at times    UE/LE Assessment   UE ROM/Strength/Tone/Coordination: WFL    LE ROM/Strength/Tone/Coordination: Impaired LE ROM/Strength/Tone/Coordination 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    Communication   Communication Communication: No apparent difficulties     Cognition Overall Cognitive Status: Appears within functional limits for tasks assessed/performed       General Comments      Exercises     Assessment/Plan    PT Problem List         PT Visit Diagnosis Other abnormalities of gait and mobility (R26.89)    No Skilled PT Patient at baseline level of functioning;Patient is modified independent with all activity/mobility   Co-evaluation                AMPAC 6 Clicks Help needed turning from your back to your side while in a flat bed without using bedrails?: None Help needed moving from lying on your back to sitting on the side of a flat bed without using bedrails?: None Help needed moving to and from a bed to a chair (including a wheelchair)?: None Help needed standing up from a chair using your arms (e.g., wheelchair or bedside chair)?: None Help needed to walk in hospital room?: None Help needed climbing 3-5 steps with a railing? : None 6 Click Score: 24  End of Session   Activity Tolerance: Patient tolerated treatment well Patient left: in bed;with call bell/phone within reach (MD present)   PT Visit Diagnosis: Other abnormalities of gait and mobility (R26.89)     Time: 9156-9142 PT Time Calculation (min) (ACUTE ONLY): 14 min  Charges:   PT Evaluation $PT Eval Low Complexity: 1 Low      [facility] PT Acute Rehabilitation Services Office [phone]   [facility] ORN [facility]  09/24/2024, 9:02 AM

## 2024-09-24 NOTE — Assessment & Plan Note (Signed)
 Continue levothyroxine 

## 2024-09-24 NOTE — Progress Notes (Signed)
 2 Days Post-Op   Subjective/Chief Complaint: Feels good, had a good day yesterday watching football  Objective: Vital signs in last 24 hours: Temp:  [97.5 F (36.4 C)-98.3 F (36.8 C)] 97.5 F (36.4 C) (11/30 0844) Pulse Rate:  [74-84] 78 (11/30 0844) Resp:  [16-18] 16 (11/30 0844) BP: (121-143)/(67-77) 143/72 (11/30 0844) SpO2:  [96 %-99 %] 98 % (11/30 0844) Last BM Date : 09/22/24  Intake/Output from previous day: 11/29 0701 - 11/30 0700 In: 360 [P.O.:360] Out: -  Intake/Output this shift: No intake/output data recorded.  Alert, well-appearing Unlabored respirations Abdomen is soft, nondistended, generally, appropriately tender.  Minimal evolving ecchymosis around the epigastric port site but no cellulitis or hematoma.  Lab Results:  Recent Labs    09/23/24 0431 09/24/24 0318  WBC 11.3* 11.5*  HGB 11.9* 11.7*  HCT 34.9* 34.2*  PLT 192 182   BMET Recent Labs    09/23/24 0431 09/24/24 0318  NA 133* 133*  K 3.9 3.8  CL 101 103  CO2 22 22  GLUCOSE 103* 107*  BUN 12 13  CREATININE 1.02 0.89  CALCIUM 8.0* 8.0*   PT/INR Recent Labs    09/22/24 0528  LABPROT 16.7*  INR 1.3*   ABG No results for input(s): PHART, HCO3 in the last 72 hours.  Invalid input(s): PCO2, PO2  Studies/Results: US  Abdomen Limited RUQ (LIVER/GB) Result Date: 09/23/2024 CLINICAL DATA:  Elevated liver function tests status post cholecystectomy EXAM: ULTRASOUND ABDOMEN LIMITED RIGHT UPPER QUADRANT COMPARISON:  CT abdomen and pelvis dated 09/20/2024 FINDINGS: Gallbladder: Status post cholecystectomy. Small volume fluid at the cholecystectomy site. 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 Portal vein is patent on color Doppler imaging with normal direction of blood flow towards the liver. Other: Partially imaged right pleural effusion. IMPRESSION: 1. Status post cholecystectomy with small volume fluid at the cholecystectomy site. 2.  Partially imaged right pleural effusion. Electronically Signed   By: Limin  Xu M.D.   On: 09/23/2024 13:19    Anti-infectives: Anti-infectives (From admission, onward)    Start     Dose/Rate Route Frequency Ordered Stop   09/24/24 0000  metroNIDAZOLE  (FLAGYL ) 500 MG tablet        500 mg Oral Every 12 hours 09/24/24 0916 09/27/24 2359   09/24/24 0000  ciprofloxacin  (CIPRO ) 500 MG tablet        500 mg Oral 2 times daily 09/24/24 0916 09/27/24 2359   09/23/24 2200  metroNIDAZOLE  (FLAGYL ) tablet 500 mg        500 mg Oral Every 12 hours 09/23/24 1426     09/23/24 2000  ciprofloxacin  (CIPRO ) tablet 500 mg        500 mg Oral 2 times daily 09/23/24 1426     09/21/24 1000  cefTRIAXone  (ROCEPHIN ) 2 g in sodium chloride  0.9 % 100 mL IVPB  Status:  Discontinued        2 g 200 mL/hr over 30 Minutes Intravenous Daily 09/21/24 0633 09/23/24 1426   09/21/24 0645  metroNIDAZOLE  (FLAGYL ) IVPB 500 mg  Status:  Discontinued        500 mg 100 mL/hr over 60 Minutes Intravenous 2 times daily 09/21/24 9366 09/23/24 1426   09/20/24 2200  ciprofloxacin  (CIPRO ) IVPB 400 mg       Placed in And Linked Group   400 mg 200 mL/hr over 60 Minutes Intravenous  Once 09/20/24 2158 09/20/24 2335   09/20/24 2200  metroNIDAZOLE  (FLAGYL ) IVPB  500 mg       Placed in And Linked Group   500 mg 100 mL/hr over 60 Minutes Intravenous  Once 09/20/24 2158 09/21/24 0038       Assessment/Plan:  83 year old male with history of A-fib on Eliquis  (held since admission) and 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 Polly with intraoperative findings notable for significant inflammation consistent with acute cholecystitis, anatomy not amenable to cholangiogram - LFTs continue to improve this morning and white count/hemoglobin stable - Appears to have an ultrasound from this morning, unclear why this was done - Eliquis  resumed  11/29 - Okay from surgery standpoint for discharge home today.   LOS: 3 days    Lance Hubbard 09/24/2024

## 2024-09-25 LAB — SURGICAL PATHOLOGY

## 2024-09-27 ENCOUNTER — Telehealth: Payer: Self-pay | Admitting: Nurse Practitioner

## 2024-09-27 ENCOUNTER — Inpatient Hospital Stay (HOSPITAL_COMMUNITY)
Admission: EM | Admit: 2024-09-27 | Discharge: 2024-10-11 | DRG: 853 | Disposition: A | Attending: Internal Medicine | Admitting: Internal Medicine

## 2024-09-27 ENCOUNTER — Emergency Department (HOSPITAL_COMMUNITY)

## 2024-09-27 ENCOUNTER — Other Ambulatory Visit: Payer: Self-pay

## 2024-09-27 DIAGNOSIS — K839 Disease of biliary tract, unspecified: Secondary | ICD-10-CM

## 2024-09-27 DIAGNOSIS — Z79899 Other long term (current) drug therapy: Secondary | ICD-10-CM

## 2024-09-27 DIAGNOSIS — I471 Supraventricular tachycardia, unspecified: Secondary | ICD-10-CM | POA: Diagnosis not present

## 2024-09-27 DIAGNOSIS — K921 Melena: Secondary | ICD-10-CM

## 2024-09-27 DIAGNOSIS — A419 Sepsis, unspecified organism: Principal | ICD-10-CM | POA: Diagnosis present

## 2024-09-27 DIAGNOSIS — R188 Other ascites: Secondary | ICD-10-CM | POA: Diagnosis present

## 2024-09-27 DIAGNOSIS — Z1152 Encounter for screening for COVID-19: Secondary | ICD-10-CM

## 2024-09-27 DIAGNOSIS — K2289 Other specified disease of esophagus: Secondary | ICD-10-CM | POA: Diagnosis present

## 2024-09-27 DIAGNOSIS — K9186 Retained cholelithiasis following cholecystectomy: Secondary | ICD-10-CM | POA: Diagnosis present

## 2024-09-27 DIAGNOSIS — Z8249 Family history of ischemic heart disease and other diseases of the circulatory system: Secondary | ICD-10-CM

## 2024-09-27 DIAGNOSIS — E871 Hypo-osmolality and hyponatremia: Secondary | ICD-10-CM | POA: Diagnosis present

## 2024-09-27 DIAGNOSIS — E785 Hyperlipidemia, unspecified: Secondary | ICD-10-CM

## 2024-09-27 DIAGNOSIS — Z880 Allergy status to penicillin: Secondary | ICD-10-CM

## 2024-09-27 DIAGNOSIS — K2970 Gastritis, unspecified, without bleeding: Secondary | ICD-10-CM | POA: Diagnosis present

## 2024-09-27 DIAGNOSIS — Z806 Family history of leukemia: Secondary | ICD-10-CM

## 2024-09-27 DIAGNOSIS — K668 Other specified disorders of peritoneum: Secondary | ICD-10-CM

## 2024-09-27 DIAGNOSIS — I959 Hypotension, unspecified: Secondary | ICD-10-CM | POA: Diagnosis present

## 2024-09-27 DIAGNOSIS — C9210 Chronic myeloid leukemia, BCR/ABL-positive, not having achieved remission: Secondary | ICD-10-CM | POA: Diagnosis present

## 2024-09-27 DIAGNOSIS — I5022 Chronic systolic (congestive) heart failure: Secondary | ICD-10-CM | POA: Diagnosis present

## 2024-09-27 DIAGNOSIS — K5900 Constipation, unspecified: Secondary | ICD-10-CM | POA: Diagnosis present

## 2024-09-27 DIAGNOSIS — I2510 Atherosclerotic heart disease of native coronary artery without angina pectoris: Secondary | ICD-10-CM | POA: Diagnosis present

## 2024-09-27 DIAGNOSIS — R1011 Right upper quadrant pain: Secondary | ICD-10-CM

## 2024-09-27 DIAGNOSIS — N179 Acute kidney failure, unspecified: Secondary | ICD-10-CM | POA: Diagnosis present

## 2024-09-27 DIAGNOSIS — I5023 Acute on chronic systolic (congestive) heart failure: Secondary | ICD-10-CM | POA: Diagnosis present

## 2024-09-27 DIAGNOSIS — C9211 Chronic myeloid leukemia, BCR/ABL-positive, in remission: Secondary | ICD-10-CM | POA: Diagnosis present

## 2024-09-27 DIAGNOSIS — Z87891 Personal history of nicotine dependence: Secondary | ICD-10-CM

## 2024-09-27 DIAGNOSIS — K449 Diaphragmatic hernia without obstruction or gangrene: Secondary | ICD-10-CM | POA: Diagnosis present

## 2024-09-27 DIAGNOSIS — K91840 Postprocedural hemorrhage and hematoma of a digestive system organ or structure following a digestive system procedure: Secondary | ICD-10-CM | POA: Insufficient documentation

## 2024-09-27 DIAGNOSIS — R7989 Other specified abnormal findings of blood chemistry: Secondary | ICD-10-CM

## 2024-09-27 DIAGNOSIS — Z7901 Long term (current) use of anticoagulants: Secondary | ICD-10-CM

## 2024-09-27 DIAGNOSIS — I272 Pulmonary hypertension, unspecified: Secondary | ICD-10-CM

## 2024-09-27 DIAGNOSIS — I4891 Unspecified atrial fibrillation: Secondary | ICD-10-CM | POA: Diagnosis present

## 2024-09-27 DIAGNOSIS — T502X5A Adverse effect of carbonic-anhydrase inhibitors, benzothiadiazides and other diuretics, initial encounter: Secondary | ICD-10-CM | POA: Diagnosis not present

## 2024-09-27 DIAGNOSIS — L02211 Cutaneous abscess of abdominal wall: Secondary | ICD-10-CM | POA: Diagnosis present

## 2024-09-27 DIAGNOSIS — I493 Ventricular premature depolarization: Secondary | ICD-10-CM | POA: Diagnosis present

## 2024-09-27 DIAGNOSIS — E039 Hypothyroidism, unspecified: Secondary | ICD-10-CM | POA: Diagnosis present

## 2024-09-27 DIAGNOSIS — E876 Hypokalemia: Secondary | ICD-10-CM | POA: Diagnosis present

## 2024-09-27 DIAGNOSIS — D638 Anemia in other chronic diseases classified elsewhere: Secondary | ICD-10-CM | POA: Diagnosis present

## 2024-09-27 DIAGNOSIS — I110 Hypertensive heart disease with heart failure: Secondary | ICD-10-CM | POA: Diagnosis present

## 2024-09-27 DIAGNOSIS — K2980 Duodenitis without bleeding: Secondary | ICD-10-CM | POA: Diagnosis present

## 2024-09-27 DIAGNOSIS — D62 Acute posthemorrhagic anemia: Secondary | ICD-10-CM | POA: Diagnosis present

## 2024-09-27 DIAGNOSIS — I480 Paroxysmal atrial fibrillation: Principal | ICD-10-CM

## 2024-09-27 DIAGNOSIS — L89156 Pressure-induced deep tissue damage of sacral region: Secondary | ICD-10-CM | POA: Diagnosis present

## 2024-09-27 DIAGNOSIS — Z9049 Acquired absence of other specified parts of digestive tract: Secondary | ICD-10-CM

## 2024-09-27 DIAGNOSIS — I420 Dilated cardiomyopathy: Secondary | ICD-10-CM | POA: Diagnosis present

## 2024-09-27 DIAGNOSIS — Z79890 Hormone replacement therapy (postmenopausal): Secondary | ICD-10-CM

## 2024-09-27 DIAGNOSIS — E7800 Pure hypercholesterolemia, unspecified: Secondary | ICD-10-CM | POA: Diagnosis present

## 2024-09-27 DIAGNOSIS — I4819 Other persistent atrial fibrillation: Secondary | ICD-10-CM | POA: Diagnosis present

## 2024-09-27 DIAGNOSIS — Y836 Removal of other organ (partial) (total) as the cause of abnormal reaction of the patient, or of later complication, without mention of misadventure at the time of the procedure: Secondary | ICD-10-CM | POA: Diagnosis present

## 2024-09-27 DIAGNOSIS — E8809 Other disorders of plasma-protein metabolism, not elsewhere classified: Secondary | ICD-10-CM | POA: Diagnosis present

## 2024-09-27 LAB — CBC WITH DIFFERENTIAL/PLATELET
Abs Immature Granulocytes: 0.1 K/uL — ABNORMAL HIGH (ref 0.00–0.07)
Basophils Absolute: 0 K/uL (ref 0.0–0.1)
Basophils Relative: 0 %
Eosinophils Absolute: 0 K/uL (ref 0.0–0.5)
Eosinophils Relative: 0 %
HCT: 36.6 % — ABNORMAL LOW (ref 39.0–52.0)
Hemoglobin: 12.4 g/dL — ABNORMAL LOW (ref 13.0–17.0)
Immature Granulocytes: 1 %
Lymphocytes Relative: 5 %
Lymphs Abs: 0.8 K/uL (ref 0.7–4.0)
MCH: 29 pg (ref 26.0–34.0)
MCHC: 33.9 g/dL (ref 30.0–36.0)
MCV: 85.7 fL (ref 80.0–100.0)
Monocytes Absolute: 1.6 K/uL — ABNORMAL HIGH (ref 0.1–1.0)
Monocytes Relative: 10 %
Neutro Abs: 14.2 K/uL — ABNORMAL HIGH (ref 1.7–7.7)
Neutrophils Relative %: 84 %
Platelets: 311 K/uL (ref 150–400)
RBC: 4.27 MIL/uL (ref 4.22–5.81)
RDW: 14.2 % (ref 11.5–15.5)
WBC: 16.8 K/uL — ABNORMAL HIGH (ref 4.0–10.5)
nRBC: 0 % (ref 0.0–0.2)

## 2024-09-27 LAB — COMPREHENSIVE METABOLIC PANEL WITH GFR
ALT: 102 U/L — ABNORMAL HIGH (ref 0–44)
AST: 69 U/L — ABNORMAL HIGH (ref 15–41)
Albumin: 2.8 g/dL — ABNORMAL LOW (ref 3.5–5.0)
Alkaline Phosphatase: 213 U/L — ABNORMAL HIGH (ref 38–126)
Anion gap: 7 (ref 5–15)
BUN: 10 mg/dL (ref 8–23)
CO2: 25 mmol/L (ref 22–32)
Calcium: 8.3 mg/dL — ABNORMAL LOW (ref 8.9–10.3)
Chloride: 98 mmol/L (ref 98–111)
Creatinine, Ser: 1.39 mg/dL — ABNORMAL HIGH (ref 0.61–1.24)
GFR, Estimated: 51 mL/min — ABNORMAL LOW (ref >60)
Glucose, Bld: 125 mg/dL — ABNORMAL HIGH (ref 70–99)
Potassium: 3.7 mmol/L (ref 3.5–5.1)
Sodium: 130 mmol/L — ABNORMAL LOW (ref 135–145)
Total Bilirubin: 3.9 mg/dL — ABNORMAL HIGH (ref 0.0–1.2)
Total Protein: 6 g/dL — ABNORMAL LOW (ref 6.5–8.1)

## 2024-09-27 LAB — I-STAT CG4 LACTIC ACID, ED: Lactic Acid, Venous: 1.1 mmol/L (ref 0.5–1.9)

## 2024-09-27 LAB — LIPASE, BLOOD: Lipase: 146 U/L — ABNORMAL HIGH (ref 11–51)

## 2024-09-27 LAB — PROTIME-INR
INR: 1.5 — ABNORMAL HIGH (ref 0.8–1.2)
Prothrombin Time: 19 s — ABNORMAL HIGH (ref 11.4–15.2)

## 2024-09-27 LAB — RESP PANEL BY RT-PCR (RSV, FLU A&B, COVID)  RVPGX2
Influenza A by PCR: NEGATIVE
Influenza B by PCR: NEGATIVE
Resp Syncytial Virus by PCR: NEGATIVE
SARS Coronavirus 2 by RT PCR: NEGATIVE

## 2024-09-27 LAB — MAGNESIUM: Magnesium: 1.9 mg/dL (ref 1.7–2.4)

## 2024-09-27 MED ORDER — MORPHINE SULFATE (PF) 4 MG/ML IV SOLN
4.0000 mg | Freq: Once | INTRAVENOUS | Status: AC
  Administered 2024-09-27: 4 mg via INTRAVENOUS
  Filled 2024-09-27: qty 1

## 2024-09-27 MED ORDER — FENTANYL CITRATE (PF) 50 MCG/ML IJ SOSY
50.0000 ug | PREFILLED_SYRINGE | Freq: Once | INTRAMUSCULAR | Status: AC
  Administered 2024-09-27: 50 ug via INTRAVENOUS
  Filled 2024-09-27: qty 1

## 2024-09-27 MED ORDER — DILTIAZEM HCL 25 MG/5ML IV SOLN: 20.0000 mg | Freq: Once | INTRAVENOUS | Status: DC

## 2024-09-27 MED ORDER — LACTATED RINGERS IV SOLN: INTRAVENOUS | Status: DC

## 2024-09-27 MED ORDER — METOCLOPRAMIDE HCL 5 MG/ML IJ SOLN
10.0000 mg | Freq: Once | INTRAMUSCULAR | Status: AC
  Administered 2024-09-27: 10 mg via INTRAVENOUS
  Filled 2024-09-27: qty 2

## 2024-09-27 MED ORDER — IOHEXOL 350 MG/ML SOLN
75.0000 mL | Freq: Once | INTRAVENOUS | Status: AC | PRN
  Administered 2024-09-27: 75 mL via INTRAVENOUS

## 2024-09-27 MED ORDER — ONDANSETRON HCL 4 MG/2ML IJ SOLN
4.0000 mg | Freq: Once | INTRAMUSCULAR | Status: AC
  Administered 2024-09-27: 4 mg via INTRAVENOUS
  Filled 2024-09-27: qty 2

## 2024-09-27 NOTE — ED Provider Notes (Incomplete)
 [place] EMERGENCY DEPARTMENT AT [facility] Provider Note   CSN: [id number] Arrival date & time: 09/27/24  2112     Patient presents with: Abdominal Pain   Lance Hubbard is a 83 y.o. male who presents emergency department for severe abdominal pain and hypotension.  He is status post cholecystectomy.  Patient had his gallbladder removed on November 28.  He has been home for the past several days with very poor appetite and constant pain.  Today was the first that he is not taking any pain meds.  He states that this morning at breakfast he had 2 servings of oatmeal and then had onset of severe pain in his epigastrium rating out to the right lateral abdomen.  He states that it was sharp and breath taking.  He reports that it was the exact same pain he had with his gallbladder attacks.  This evening he tried to eat about 1/4 cup of chicken soup and had onset of the same severe pain.  Patient's wife apparently called his cardiology group earlier today and there is note stating that if he is hypotensive with poor appetite and nurse practitioner was encouraging them to call EMS and bring him in immediately.  He has had some nausea but denies vomiting.  Pain is moderate at this time but he has episodes of severe pain.  It does not radiate to his back.  Review of EMR shows that the patient was seen by both surgery and GI for his elevated liver enzymes.  He was supposed to have an intraoperative cholangiogram but apparently his anatomy was not amenable to this procedure and given his downtrending liver enzymes he was released from the hospital with patient that he likely passed a gallstone..  {Add pertinent medical, surgical, social history, OB history to HPI:32947}  Abdominal Pain      Prior to Admission medications   Medication Sig Start Date End Date Taking? Authorizing Provider  acetaminophen  (TYLENOL ) 325 MG tablet Take 2 tablets (650 mg total) by mouth every 6 (six) hours as needed  for mild pain (pain score 1-3) or moderate pain (pain score 4-6). 09/24/24   Arrien, Elidia Sieving, MD  apixaban  (ELIQUIS ) 5 MG TABS tablet Take 1 tablet (5 mg total) by mouth every 12 (twelve) hours. 12/20/13   Levern Hutching, MD  bosutinib (BOSULIF) 400 MG tablet Take 400 mg by mouth every evening.    [provider]  ciprofloxacin  (CIPRO ) 500 MG tablet Take 1 tablet (500 mg total) by mouth 2 (two) times daily for 3 days. 09/24/24 09/27/24  Arrien, Mauricio Daniel, MD  clobetasol  cream (TEMOVATE ) 0.05 % Apply 1 application topically 2 (two) times daily as needed (rash).    [provider]  docusate sodium  (COLACE) 100 MG capsule Take 1 capsule (100 mg total) by mouth 2 (two) times daily. Okay to decrease to once daily or stop taking if having loose bowel movements 09/24/24 10/24/24  Arrien, Elidia Sieving, MD  levothyroxine  (SYNTHROID ) 25 MCG tablet Take 25 mcg by mouth daily before breakfast. 03/19/21   [provider]  metroNIDAZOLE  (FLAGYL ) 500 MG tablet Take 1 tablet (500 mg total) by mouth every 12 (twelve) hours for 3 days. 09/24/24 09/27/24  Arrien, Elidia Sieving, MD  oxyCODONE  (OXY IR/ROXICODONE ) 5 MG immediate release tablet Take 1 tablet (5 mg total) by mouth every 8 (eight) hours as needed for severe pain (pain score 7-10) (For pain not relieved with Tylenol , ibuprofen, rest, ice,). 09/24/24   Arrien, Mauricio  Toribio, MD  rosuvastatin (CRESTOR) 5 MG tablet Take 5 mg by mouth at bedtime. Patient taking differently: Take 5 mg by mouth every other day.    [provider]  sacubitril-valsartan (ENTRESTO ) 49-51 MG Take 0.5 tablets by mouth 2 (two) times daily.    [provider]    Allergies: Amoxicillin and Penicillin g    Review of Systems  Gastrointestinal:  Positive for abdominal pain.    Updated Vital Signs BP (!) 110/99 (BP Location: Right Arm)   Pulse (!) 163   Temp 98.5 F (36.9 C) (Oral)   Resp 18   SpO2 98%   Physical  Exam  (all labs ordered are listed, but only abnormal results are displayed) Labs Reviewed  CULTURE, BLOOD (ROUTINE X 2)  CULTURE, BLOOD (ROUTINE X 2)  CBC WITH DIFFERENTIAL/PLATELET  COMPREHENSIVE METABOLIC PANEL WITH GFR  LIPASE, BLOOD  URINALYSIS, ROUTINE W REFLEX MICROSCOPIC  I-STAT CG4 LACTIC ACID, ED    EKG: EKG Interpretation Date/Time:  Wednesday September 27 2024 21:38:37 EST Ventricular Rate:  153 PR Interval:    QRS Duration:  101 QT Interval:  308 QTC Calculation: 492 R Axis:   87  Text Interpretation: Atrial fibrillation with rapid V-rate Ventricular premature complex Low voltage, extremity leads ST depression, probably rate related Since last tracing rate faster Otherwise no significant change Confirmed by Emil Share [phone]) on 09/27/2024 9:40:35 PM  Radiology: No results found.  {Document cardiac monitor, telemetry assessment procedure when appropriate:32947} Procedures   Medications Ordered in the ED  fentaNYL  (SUBLIMAZE ) injection 50 mcg (has no administration in time range)  metoCLOPramide (REGLAN) injection 10 mg (has no administration in time range)      {Click here for ABCD2, HEART and other calculators REFRESH Note before signing:1}                              Medical Decision Making Amount and/or Complexity of Data Reviewed Labs: ordered. Radiology: ordered. ECG/medicine tests: ordered.  Risk Prescription drug management.   ***  {Document critical care time when appropriate  Document review of labs and clinical decision tools ie CHADS2VASC2, etc  Document your independent review of radiology images and any outside records  Document your discussion with family members, caretakers and with consultants  Document social determinants of health affecting pt's care  Document your decision making why or why not admission, treatments were needed:32947:::1}   Final diagnoses:  None    ED Discharge Orders     None

## 2024-09-27 NOTE — ED Notes (Signed)
 Patient transported to CT

## 2024-09-27 NOTE — ED Triage Notes (Signed)
 Pt BIB [facility] coming from home with c/o uncontrollable R flank pain since yesterday night that has increased in severity. Pt's gallbladder removed on Friday and discharged yesterday. Abx round finished. Pain 1/10 on arrival. AO x4.  EMS: 130HR w/ A FIB  Fentanyl  500mL NS

## 2024-09-27 NOTE — ED Provider Notes (Signed)
 [place] EMERGENCY DEPARTMENT AT [facility] Provider Note   CSN: [id number] Arrival date & time: 09/27/24  2112     Patient presents with: Abdominal Pain   Lance Hubbard is a 83 y.o. male who presents emergency department for severe abdominal pain and hypotension.  He is status post cholecystectomy.  Patient had his gallbladder removed on November 28.  He has been home for the past several days with very poor appetite and constant pain.  Today was the first that he is not taking any pain meds.  He states that this morning at breakfast he had 2 servings of oatmeal and then had onset of severe pain in his epigastrium rating out to the right lateral abdomen.  He states that it was sharp and breath taking.  He reports that it was the exact same pain he had with his gallbladder attacks.  This evening he tried to eat about 1/4 cup of chicken soup and had onset of the same severe pain.  Patient's wife apparently called his cardiology group earlier today and there is note stating that if he is hypotensive with poor appetite and nurse practitioner was encouraging them to call EMS and bring him in immediately.  He has had some nausea but denies vomiting.  Pain is moderate at this time but he has episodes of severe pain.  It does not radiate to his back.  Review of EMR shows that the patient was seen by both surgery and GI for his elevated liver enzymes.  He was supposed to have an intraoperative cholangiogram but apparently his anatomy was not amenable to this procedure and given his downtrending liver enzymes he was released from the hospital with patient that he likely passed a gallstone..  {Add pertinent medical, surgical, social history, OB history to HPI:32947}  Abdominal Pain      Prior to Admission medications   Medication Sig Start Date End Date Taking? Authorizing Provider  acetaminophen  (TYLENOL ) 325 MG tablet Take 2 tablets (650 mg total) by mouth every 6 (six) hours as needed  for mild pain (pain score 1-3) or moderate pain (pain score 4-6). 09/24/24   Arrien, Elidia Sieving, MD  apixaban  (ELIQUIS ) 5 MG TABS tablet Take 1 tablet (5 mg total) by mouth every 12 (twelve) hours. 12/20/13   Levern Hutching, MD  bosutinib (BOSULIF ) 400 MG tablet Take 400 mg by mouth every evening.    [provider]  ciprofloxacin  (CIPRO ) 500 MG tablet Take 1 tablet (500 mg total) by mouth 2 (two) times daily for 3 days. 09/24/24 09/27/24  Arrien, Mauricio Daniel, MD  clobetasol cream (TEMOVATE) 0.05 % Apply 1 application topically 2 (two) times daily as needed (rash).    [provider]  docusate sodium  (COLACE) 100 MG capsule Take 1 capsule (100 mg total) by mouth 2 (two) times daily. Okay to decrease to once daily or stop taking if having loose bowel movements 09/24/24 10/24/24  Arrien, Elidia Sieving, MD  levothyroxine  (SYNTHROID ) 25 MCG tablet Take 25 mcg by mouth daily before breakfast. 03/19/21   [provider]  metroNIDAZOLE  (FLAGYL ) 500 MG tablet Take 1 tablet (500 mg total) by mouth every 12 (twelve) hours for 3 days. 09/24/24 09/27/24  Arrien, Elidia Sieving, MD  oxyCODONE  (OXY IR/ROXICODONE ) 5 MG immediate release tablet Take 1 tablet (5 mg total) by mouth every 8 (eight) hours as needed for severe pain (pain score 7-10) (For pain not relieved with Tylenol , ibuprofen, rest, ice,). 09/24/24   Arrien, Mauricio  Toribio, MD  rosuvastatin  (CRESTOR ) 5 MG tablet Take 5 mg by mouth at bedtime. Patient taking differently: Take 5 mg by mouth every other day.    [provider]  sacubitril-valsartan (ENTRESTO ) 49-51 MG Take 0.5 tablets by mouth 2 (two) times daily.    [provider]    Allergies: Amoxicillin and Penicillin g    Review of Systems  Gastrointestinal:  Positive for abdominal pain.    Updated Vital Signs BP (!) 110/99 (BP Location: Right Arm)   Pulse (!) 163   Temp 98.5 F (36.9 C) (Oral)   Resp 18   SpO2 98%   Physical  Exam  (all labs ordered are listed, but only abnormal results are displayed) Labs Reviewed  CULTURE, BLOOD (ROUTINE X 2)  CULTURE, BLOOD (ROUTINE X 2)  CBC WITH DIFFERENTIAL/PLATELET  COMPREHENSIVE METABOLIC PANEL WITH GFR  LIPASE, BLOOD  URINALYSIS, ROUTINE W REFLEX MICROSCOPIC  I-STAT CG4 LACTIC ACID, ED    EKG: EKG Interpretation Date/Time:  Wednesday September 27 2024 21:38:37 EST Ventricular Rate:  153 PR Interval:    QRS Duration:  101 QT Interval:  308 QTC Calculation: 492 R Axis:   87  Text Interpretation: Atrial fibrillation with rapid V-rate Ventricular premature complex Low voltage, extremity leads ST depression, probably rate related Since last tracing rate faster Otherwise no significant change Confirmed by Emil Share [phone]) on 09/27/2024 9:40:35 PM  Radiology: No results found.  {Document cardiac monitor, telemetry assessment procedure when appropriate:32947} Procedures   Medications Ordered in the ED  fentaNYL  (SUBLIMAZE ) injection 50 mcg (has no administration in time range)  metoCLOPramide  (REGLAN ) injection 10 mg (has no administration in time range)    Clinical Course as of 09/27/24 2355  Wed Sep 27, 2024  2238 WBC(!): 16.8 [AH]  2238 Creatinine(!): 1.39 Patient's creatinine elevated appears to have an AKI [AH]  2240 Total Bilirubin(!): 3.9 [AH]  2240 AST(!): 69 [AH]  2240 ALT(!): 102 [AH]  2240 Alkaline Phosphatase(!): 213 [AH]  2240 Total Bilirubin(!): 3.9 Patient's liver enzymes, alkaline phosphatase and bilirubin trending back up [AH]  2240 Lipase(!): 146 Lipase minimally higher than it was at discharge [AH]  2245 Patient in A-fib with RVR.  Current plan is to complete a bolus of fluid if he continues to have RVR after that point we will order some calcium  channel blockers for rate control [AH]  2309 Patient seen by Dr. Teresa- recommends medicine admission -  [AH]  2353 Patient's heart rate still elevated I have ordered Cardizem  I consulted  with Dr. Devon who will not accept the patient until he has received treatment for his A-fib [AH]  2354 CT ABDOMEN PELVIS W CONTRAST I visualized interpreted CT abdomen pelvis which which shows choledocholithiasis [AH]    Clinical Course User Index [AH] Arloa Chroman, PA-C   {Click here for ABCD2, HEART and other calculators REFRESH Note before signing:1}                              Medical Decision Making Amount and/or Complexity of Data Reviewed Labs: ordered. Radiology: ordered. ECG/medicine tests: ordered.  Risk Prescription drug management.   ***  {Document critical care time when appropriate  Document review of labs and clinical decision tools ie CHADS2VASC2, etc  Document your independent review of radiology images and any outside records  Document your discussion with family members, caretakers and with consultants  Document social determinants of health affecting pt's care  Document  your decision making why or why not admission, treatments were needed:32947:::1}   Final diagnoses:  None    ED Discharge Orders     None

## 2024-09-27 NOTE — Telephone Encounter (Signed)
   Patient's wife called this evening to report that patient had cholecystectomy on November 28 and his appetite has been poor since.  Today, he was feeling very weak and she checked his heart rate and blood pressure, finding him to be tachycardic up to 140 bpm, with a blood pressure as low as 70/40.  Patient does have a history of atrial fibrillation status post catheter ablation.  I strongly recommended that they call EMS to be evaluated and likely transported to the emergency department for further evaluation.  Caller verbalized understanding and was grateful for the call back.  Lonni Meager, NP 09/27/2024, 7:52 PM

## 2024-09-27 NOTE — Consult Note (Signed)
 CC/Reason for consult: Cholecystectomy 11/28, now with   HPI: Lance Hubbard is an 83 y.o. 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 Polly. Intraoperatively noted significant inflammation consistent with acute cholecystitis however anatomy was not amenable to cholangiogram. He was admitted postoperatively and improved. LFTs downtrending. Eliquis  resumed 11/29.   Did not undergo ERCP last admission  Represented to ER via EMS with weakness, tachycardia and reported hypotension at home in setting of afib. They had reached out to cardiology whom recommended ER evaluation.  States his symptoms feel identical to when he had elevated bilirubin and a potentially stuck stone before. No n/v. Soreness in RUQ that really began this afternoon - felt great this morning.  Past Medical History:  Diagnosis Date   Arthritis    CML (chronic myelocytic leukemia) (HCC)    in remission   History of kidney stones    Hypertension    Persistent atrial fibrillation (HCC)    Atrial Fibrillation    Past Surgical History:  Procedure Laterality Date   ATRIAL FIBRILLATION ABLATION N/A 05/29/2021   Procedure: ATRIAL FIBRILLATION ABLATION;  Surgeon: Kelsie Agent, MD;  Location: [facility];  Service: Cardiovascular;  Laterality: N/A;   CARDIOVERSION N/A 12/18/2013   Procedure: CARDIOVERSION;  Surgeon: Salena GORMAN Negri, MD;  Location: [facility] ENDOSCOPY;  Service: Cardiovascular;  Laterality: N/A;  9:58 Lido 40 mg, IV Propofol  40 mg, IV for anesthesia,  synched cardioversio @ 120 joules with successful NSR    CARDIOVERSION N/A 01/18/2024   Procedure: CARDIOVERSION;  Surgeon: Jeffrie Oneil BROCKS, MD;  Location: [facility];  Service: Cardiovascular;  Laterality: N/A;   CHOLECYSTECTOMY N/A 09/22/2024   Procedure: LAPAROSCOPIC CHOLECYSTECTOMY;  Surgeon: Polly Cordella LABOR,  MD;  Location: [facility] OR;  Service: General;  Laterality: N/A;   COLONOSCOPY     KNEE ARTHROSCOPY     knee dsurgery     LEFT HEART CATHETERIZATION WITH CORONARY ANGIOGRAM N/A 12/19/2013   Procedure: LEFT HEART CATHETERIZATION WITH CORONARY ANGIOGRAM;  Surgeon: Rober LOISE Chroman, MD;  Location: [facility] CATH LAB;  Service: Cardiovascular;  Laterality: N/A;   LUMBAR LAMINECTOMY/DECOMPRESSION MICRODISCECTOMY N/A 05/17/2019   Procedure: Lumbar Four Lumbar Five Lumbar Laminectomy;  Surgeon: Alix Charleston, MD;  Location: [facility] OR;  Service: Neurosurgery;  Laterality: N/A;  Lumbar 4 Lumbar 5 Lumbar Laminectomy   TEE WITHOUT CARDIOVERSION N/A 12/18/2013   Procedure: TRANSESOPHAGEAL ECHOCARDIOGRAM (TEE);  Surgeon: Salena GORMAN Negri, MD;  Location: [facility] ENDOSCOPY;  Service: Cardiovascular;  Laterality: N/A;    Family History  Problem Relation Age of Onset   Heart disease Father    Hypertension Sister     Social:  reports that he quit smoking about 5 years ago. His smoking use included pipe. He has never used smokeless tobacco. He reports that he does not currently use alcohol. He reports that he does not use drugs.  Allergies:  Allergies  Allergen Reactions   Amoxicillin Hives and Other (See Comments)    REACTION: whelps/pain in knees and ankles   Penicillin G     Other reaction(s): hives    Medications: I have reviewed the patient's current medications.  Results for orders placed or performed during the hospital encounter of 09/27/24 (from the past 48 hours)  CBC with Differential     Status: Abnormal   Collection Time: 09/27/24  9:32 PM  Result Value Ref Range   WBC 16.8 (  H) 4.0 - 10.5 K/uL   RBC 4.27 4.22 - 5.81 MIL/uL   Hemoglobin 12.4 (L) 13.0 - 17.0 g/dL   HCT 63.3 (L) 60.9 - 47.9 %   MCV 85.7 80.0 - 100.0 fL   MCH 29.0 26.0 - 34.0 pg   MCHC 33.9 30.0 - 36.0 g/dL   RDW 85.7 88.4 - 84.4 %   Platelets 311 150 - 400 K/uL   nRBC 0.0 0.0 - 0.2 %   Neutrophils Relative % 84 %   Neutro Abs 14.2 (H)  1.7 - 7.7 K/uL   Lymphocytes Relative 5 %   Lymphs Abs 0.8 0.7 - 4.0 K/uL   Monocytes Relative 10 %   Monocytes Absolute 1.6 (H) 0.1 - 1.0 K/uL   Eosinophils Relative 0 %   Eosinophils Absolute 0.0 0.0 - 0.5 K/uL   Basophils Relative 0 %   Basophils Absolute 0.0 0.0 - 0.1 K/uL   Immature Granulocytes 1 %   Abs Immature Granulocytes 0.10 (H) 0.00 - 0.07 K/uL    Comment: Performed at [facility], [address], [place], [place] [address]  I-Stat CG4 Lactic Acid     Status: None   Collection Time: 09/27/24  9:49 PM  Result Value Ref Range   Lactic Acid, Venous 1.1 0.5 - 1.9 mmol/L    No results found.  PE Blood pressure 93/62, pulse (!) 146, temperature 98.5 F (36.9 C), temperature source Oral, resp. rate (!) 29, height 6' 2 (1.88 m), weight 99.8 kg, SpO2 100%. Constitutional: NAD; conversant Eyes: Moist conjunctiva; not appreciably icteric CV: irreg rate/rhythm GI: Abd soft, mildly ttp in RUQ; no rebound/guarding; incisions with expected ecchymoses, healing appropriately. Psychiatric: Appropriate affect  Results for orders placed or performed during the hospital encounter of 09/27/24 (from the past 48 hours)  CBC with Differential     Status: Abnormal   Collection Time: 09/27/24  9:32 PM  Result Value Ref Range   WBC 16.8 (H) 4.0 - 10.5 K/uL   RBC 4.27 4.22 - 5.81 MIL/uL   Hemoglobin 12.4 (L) 13.0 - 17.0 g/dL   HCT 63.3 (L) 60.9 - 47.9 %   MCV 85.7 80.0 - 100.0 fL   MCH 29.0 26.0 - 34.0 pg   MCHC 33.9 30.0 - 36.0 g/dL   RDW 85.7 88.4 - 84.4 %   Platelets 311 150 - 400 K/uL   nRBC 0.0 0.0 - 0.2 %   Neutrophils Relative % 84 %   Neutro Abs 14.2 (H) 1.7 - 7.7 K/uL   Lymphocytes Relative 5 %   Lymphs Abs 0.8 0.7 - 4.0 K/uL   Monocytes Relative 10 %   Monocytes Absolute 1.6 (H) 0.1 - 1.0 K/uL   Eosinophils Relative 0 %   Eosinophils Absolute 0.0 0.0 - 0.5 K/uL   Basophils Relative 0 %   Basophils Absolute 0.0 0.0 - 0.1 K/uL   Immature Granulocytes 1 %   Abs  Immature Granulocytes 0.10 (H) 0.00 - 0.07 K/uL    Comment: Performed at [facility], [address], [place], [place] [address]  I-Stat CG4 Lactic Acid     Status: None   Collection Time: 09/27/24  9:49 PM  Result Value Ref Range   Lactic Acid, Venous 1.1 0.5 - 1.9 mmol/L    No results found.   A/P: Lance Hubbard is an 83 y.o. male with afib (on Eliquis , resumed 11/29), CML, HTN, HLD, hypothyroidism - now POD#5 from lap chole for choledocho + gallstone  pancreatitis; surgical anatomy was not amenable to IOC  - Suspect retained stone based on elevated bilirubin, dilated bile ducts on CT and known history of choledocholithiasis.  Fluid collection in the gallbladder fossa 5 days out from surgery in the setting of anticoagulation would not be unexpected at present and I think bile leak less likely although ERCP could help clarify.  If ERCP were to demonstrate bile leak, could consider IR drainage of the gallbladder fossa at that time. On my read of the CT, there is an evident stone appreciated in the distal CBD, Discussed with radiology Dr. Charletta and they plan to addend their note to reflect.  - Agree with admission to medicine, would recommend GI consultation for consideration of ERCP. Anticoagulation would need to be held as per GI - We will follow with you  Lonni Pizza, MD [facility], [facility]

## 2024-09-28 ENCOUNTER — Inpatient Hospital Stay (HOSPITAL_COMMUNITY)

## 2024-09-28 ENCOUNTER — Encounter (HOSPITAL_COMMUNITY): Payer: Self-pay | Admitting: Family Medicine

## 2024-09-28 DIAGNOSIS — I471 Supraventricular tachycardia, unspecified: Secondary | ICD-10-CM | POA: Diagnosis not present

## 2024-09-28 DIAGNOSIS — K668 Other specified disorders of peritoneum: Secondary | ICD-10-CM | POA: Diagnosis not present

## 2024-09-28 DIAGNOSIS — R188 Other ascites: Secondary | ICD-10-CM | POA: Diagnosis present

## 2024-09-28 DIAGNOSIS — I4819 Other persistent atrial fibrillation: Secondary | ICD-10-CM | POA: Diagnosis present

## 2024-09-28 DIAGNOSIS — A419 Sepsis, unspecified organism: Secondary | ICD-10-CM | POA: Diagnosis present

## 2024-09-28 DIAGNOSIS — I159 Secondary hypertension, unspecified: Secondary | ICD-10-CM | POA: Diagnosis not present

## 2024-09-28 DIAGNOSIS — R1011 Right upper quadrant pain: Secondary | ICD-10-CM

## 2024-09-28 DIAGNOSIS — E876 Hypokalemia: Secondary | ICD-10-CM | POA: Diagnosis present

## 2024-09-28 DIAGNOSIS — K2289 Other specified disease of esophagus: Secondary | ICD-10-CM | POA: Diagnosis not present

## 2024-09-28 DIAGNOSIS — D62 Acute posthemorrhagic anemia: Secondary | ICD-10-CM | POA: Diagnosis present

## 2024-09-28 DIAGNOSIS — C9210 Chronic myeloid leukemia, BCR/ABL-positive, not having achieved remission: Secondary | ICD-10-CM | POA: Diagnosis not present

## 2024-09-28 DIAGNOSIS — I5023 Acute on chronic systolic (congestive) heart failure: Secondary | ICD-10-CM | POA: Diagnosis not present

## 2024-09-28 DIAGNOSIS — Z9889 Other specified postprocedural states: Secondary | ICD-10-CM | POA: Diagnosis not present

## 2024-09-28 DIAGNOSIS — C9211 Chronic myeloid leukemia, BCR/ABL-positive, in remission: Secondary | ICD-10-CM | POA: Diagnosis present

## 2024-09-28 DIAGNOSIS — L89156 Pressure-induced deep tissue damage of sacral region: Secondary | ICD-10-CM | POA: Diagnosis present

## 2024-09-28 DIAGNOSIS — I4891 Unspecified atrial fibrillation: Secondary | ICD-10-CM

## 2024-09-28 DIAGNOSIS — K2970 Gastritis, unspecified, without bleeding: Secondary | ICD-10-CM | POA: Diagnosis not present

## 2024-09-28 DIAGNOSIS — E785 Hyperlipidemia, unspecified: Secondary | ICD-10-CM

## 2024-09-28 DIAGNOSIS — K838 Other specified diseases of biliary tract: Secondary | ICD-10-CM | POA: Diagnosis not present

## 2024-09-28 DIAGNOSIS — Z87891 Personal history of nicotine dependence: Secondary | ICD-10-CM | POA: Diagnosis not present

## 2024-09-28 DIAGNOSIS — Z1152 Encounter for screening for COVID-19: Secondary | ICD-10-CM | POA: Diagnosis not present

## 2024-09-28 DIAGNOSIS — K8050 Calculus of bile duct without cholangitis or cholecystitis without obstruction: Secondary | ICD-10-CM | POA: Diagnosis not present

## 2024-09-28 DIAGNOSIS — K9186 Retained cholelithiasis following cholecystectomy: Secondary | ICD-10-CM | POA: Diagnosis present

## 2024-09-28 DIAGNOSIS — E7800 Pure hypercholesterolemia, unspecified: Secondary | ICD-10-CM | POA: Diagnosis present

## 2024-09-28 DIAGNOSIS — Z806 Family history of leukemia: Secondary | ICD-10-CM

## 2024-09-28 DIAGNOSIS — I110 Hypertensive heart disease with heart failure: Secondary | ICD-10-CM | POA: Diagnosis present

## 2024-09-28 DIAGNOSIS — D638 Anemia in other chronic diseases classified elsewhere: Secondary | ICD-10-CM | POA: Diagnosis present

## 2024-09-28 DIAGNOSIS — E8809 Other disorders of plasma-protein metabolism, not elsewhere classified: Secondary | ICD-10-CM | POA: Diagnosis present

## 2024-09-28 DIAGNOSIS — N179 Acute kidney failure, unspecified: Secondary | ICD-10-CM | POA: Diagnosis present

## 2024-09-28 DIAGNOSIS — I420 Dilated cardiomyopathy: Secondary | ICD-10-CM | POA: Diagnosis present

## 2024-09-28 DIAGNOSIS — K921 Melena: Secondary | ICD-10-CM | POA: Diagnosis not present

## 2024-09-28 DIAGNOSIS — K2980 Duodenitis without bleeding: Secondary | ICD-10-CM | POA: Diagnosis present

## 2024-09-28 DIAGNOSIS — K449 Diaphragmatic hernia without obstruction or gangrene: Secondary | ICD-10-CM | POA: Diagnosis not present

## 2024-09-28 DIAGNOSIS — K839 Disease of biliary tract, unspecified: Secondary | ICD-10-CM | POA: Diagnosis not present

## 2024-09-28 DIAGNOSIS — E039 Hypothyroidism, unspecified: Secondary | ICD-10-CM | POA: Diagnosis present

## 2024-09-28 DIAGNOSIS — I272 Pulmonary hypertension, unspecified: Secondary | ICD-10-CM | POA: Diagnosis present

## 2024-09-28 DIAGNOSIS — I10 Essential (primary) hypertension: Secondary | ICD-10-CM | POA: Diagnosis not present

## 2024-09-28 DIAGNOSIS — Z4659 Encounter for fitting and adjustment of other gastrointestinal appliance and device: Secondary | ICD-10-CM | POA: Diagnosis not present

## 2024-09-28 DIAGNOSIS — Z7901 Long term (current) use of anticoagulants: Secondary | ICD-10-CM | POA: Diagnosis not present

## 2024-09-28 DIAGNOSIS — I5022 Chronic systolic (congestive) heart failure: Secondary | ICD-10-CM

## 2024-09-28 DIAGNOSIS — Y836 Removal of other organ (partial) (total) as the cause of abnormal reaction of the patient, or of later complication, without mention of misadventure at the time of the procedure: Secondary | ICD-10-CM | POA: Diagnosis present

## 2024-09-28 DIAGNOSIS — E871 Hypo-osmolality and hyponatremia: Secondary | ICD-10-CM | POA: Diagnosis present

## 2024-09-28 DIAGNOSIS — L02211 Cutaneous abscess of abdominal wall: Secondary | ICD-10-CM | POA: Diagnosis present

## 2024-09-28 DIAGNOSIS — R7989 Other specified abnormal findings of blood chemistry: Secondary | ICD-10-CM

## 2024-09-28 DIAGNOSIS — I2510 Atherosclerotic heart disease of native coronary artery without angina pectoris: Secondary | ICD-10-CM | POA: Diagnosis not present

## 2024-09-28 DIAGNOSIS — K91840 Postprocedural hemorrhage and hematoma of a digestive system organ or structure following a digestive system procedure: Secondary | ICD-10-CM | POA: Diagnosis not present

## 2024-09-28 DIAGNOSIS — I480 Paroxysmal atrial fibrillation: Secondary | ICD-10-CM | POA: Diagnosis present

## 2024-09-28 LAB — URINALYSIS, W/ REFLEX TO CULTURE (INFECTION SUSPECTED)
Glucose, UA: NEGATIVE mg/dL
Hgb urine dipstick: NEGATIVE
Ketones, ur: NEGATIVE mg/dL
Leukocytes,Ua: NEGATIVE
Nitrite: NEGATIVE
Protein, ur: 30 mg/dL — AB
Specific Gravity, Urine: >1.046 — ABNORMAL HIGH (ref 1.005–1.030)
pH: 5 (ref 5.0–8.0)

## 2024-09-28 LAB — CBC
HCT: 35.6 % — ABNORMAL LOW (ref 39.0–52.0)
Hemoglobin: 12.1 g/dL — ABNORMAL LOW (ref 13.0–17.0)
MCH: 29.2 pg (ref 26.0–34.0)
MCHC: 34 g/dL (ref 30.0–36.0)
MCV: 86 fL (ref 80.0–100.0)
Platelets: 305 K/uL (ref 150–400)
RBC: 4.14 MIL/uL — ABNORMAL LOW (ref 4.22–5.81)
RDW: 14.2 % (ref 11.5–15.5)
WBC: 14.8 K/uL — ABNORMAL HIGH (ref 4.0–10.5)
nRBC: 0 % (ref 0.0–0.2)

## 2024-09-28 LAB — HEPATIC FUNCTION PANEL
ALT: 99 U/L — ABNORMAL HIGH (ref 0–44)
AST: 63 U/L — ABNORMAL HIGH (ref 15–41)
Albumin: 2.7 g/dL — ABNORMAL LOW (ref 3.5–5.0)
Alkaline Phosphatase: 218 U/L — ABNORMAL HIGH (ref 38–126)
Bilirubin, Direct: 1.1 mg/dL — ABNORMAL HIGH (ref 0.0–0.2)
Indirect Bilirubin: 1 mg/dL — ABNORMAL HIGH (ref 0.3–0.9)
Total Bilirubin: 2.1 mg/dL — ABNORMAL HIGH (ref 0.0–1.2)
Total Protein: 6 g/dL — ABNORMAL LOW (ref 6.5–8.1)

## 2024-09-28 LAB — BASIC METABOLIC PANEL WITH GFR
Anion gap: 7 (ref 5–15)
BUN: 12 mg/dL (ref 8–23)
CO2: 21 mmol/L — ABNORMAL LOW (ref 22–32)
Calcium: 8 mg/dL — ABNORMAL LOW (ref 8.9–10.3)
Chloride: 103 mmol/L (ref 98–111)
Creatinine, Ser: 1.27 mg/dL — ABNORMAL HIGH (ref 0.61–1.24)
GFR, Estimated: 56 mL/min — ABNORMAL LOW (ref >60)
Glucose, Bld: 108 mg/dL — ABNORMAL HIGH (ref 70–99)
Potassium: 4 mmol/L (ref 3.5–5.1)
Sodium: 131 mmol/L — ABNORMAL LOW (ref 135–145)

## 2024-09-28 LAB — HEPARIN LEVEL (UNFRACTIONATED): Heparin Unfractionated: >1.1 [IU]/mL — ABNORMAL HIGH (ref 0.30–0.70)

## 2024-09-28 LAB — APTT: aPTT: 50 s — ABNORMAL HIGH (ref 24–36)

## 2024-09-28 LAB — BRAIN NATRIURETIC PEPTIDE: B Natriuretic Peptide: 494.4 pg/mL — ABNORMAL HIGH (ref 0.0–100.0)

## 2024-09-28 LAB — TSH: TSH: 3.099 u[IU]/mL (ref 0.350–4.500)

## 2024-09-28 MED ORDER — LEVOTHYROXINE SODIUM 50 MCG PO TABS
25.0000 ug | ORAL_TABLET | Freq: Every day | ORAL | Status: DC
  Administered 2024-09-28 – 2024-10-11 (×14): 25 ug via ORAL
  Filled 2024-09-28 (×14): qty 1

## 2024-09-28 MED ORDER — ONDANSETRON HCL 4 MG/2ML IJ SOLN: 4.0000 mg | Freq: Four times a day (QID) | INTRAMUSCULAR | Status: DC | PRN

## 2024-09-28 MED ORDER — AMIODARONE LOAD VIA INFUSION
150.0000 mg | Freq: Once | INTRAVENOUS | Status: AC
  Administered 2024-09-28: 150 mg via INTRAVENOUS
  Filled 2024-09-28: qty 83.34

## 2024-09-28 MED ORDER — DOCUSATE SODIUM 100 MG PO CAPS: 100.0000 mg | ORAL_CAPSULE | Freq: Two times a day (BID) | ORAL | Status: DC

## 2024-09-28 MED ORDER — MAGNESIUM HYDROXIDE 400 MG/5ML PO SUSP
30.0000 mL | Freq: Every day | ORAL | Status: DC | PRN
  Administered 2024-10-02 – 2024-10-05 (×2): 30 mL via ORAL
  Filled 2024-09-28 (×2): qty 30

## 2024-09-28 MED ORDER — ALPRAZOLAM 0.25 MG PO TABS: 0.2500 mg | ORAL_TABLET | Freq: Two times a day (BID) | ORAL | Status: DC | PRN

## 2024-09-28 MED ORDER — METRONIDAZOLE 500 MG/100ML IV SOLN
500.0000 mg | Freq: Once | INTRAVENOUS | Status: AC
  Administered 2024-09-28: 500 mg via INTRAVENOUS
  Filled 2024-09-28: qty 100

## 2024-09-28 MED ORDER — VANCOMYCIN HCL 1500 MG/300ML IV SOLN
1500.0000 mg | INTRAVENOUS | Status: DC
  Administered 2024-09-28 – 2024-10-01 (×4): 1500 mg via INTRAVENOUS
  Filled 2024-09-28 (×5): qty 300

## 2024-09-28 MED ORDER — LACTATED RINGERS IV SOLN: INTRAVENOUS | Status: AC

## 2024-09-28 MED ORDER — SENNOSIDES-DOCUSATE SODIUM 8.6-50 MG PO TABS
1.0000 | ORAL_TABLET | Freq: Two times a day (BID) | ORAL | Status: DC
  Administered 2024-09-29 – 2024-10-10 (×20): 1 via ORAL
  Filled 2024-09-28 (×24): qty 1

## 2024-09-28 MED ORDER — SODIUM CHLORIDE 0.9 % IV SOLN
2.0000 g | Freq: Two times a day (BID) | INTRAVENOUS | Status: DC
  Administered 2024-09-28 – 2024-10-02 (×8): 2 g via INTRAVENOUS
  Filled 2024-09-28 (×8): qty 12.5

## 2024-09-28 MED ORDER — AMIODARONE HCL IN DEXTROSE 360-4.14 MG/200ML-% IV SOLN
60.0000 mg/h | INTRAVENOUS | Status: DC
  Administered 2024-09-28 (×2): 30 mg/h via INTRAVENOUS
  Administered 2024-09-29 – 2024-09-30 (×5): 60 mg/h via INTRAVENOUS
  Administered 2024-09-30 – 2024-10-07 (×15): 30 mg/h via INTRAVENOUS
  Filled 2024-09-28 (×21): qty 200

## 2024-09-28 MED ORDER — METRONIDAZOLE 500 MG/100ML IV SOLN
500.0000 mg | Freq: Two times a day (BID) | INTRAVENOUS | Status: DC
  Administered 2024-09-28 – 2024-10-04 (×14): 500 mg via INTRAVENOUS
  Filled 2024-09-28 (×15): qty 100

## 2024-09-28 MED ORDER — POLYETHYLENE GLYCOL 3350 17 G PO PACK
17.0000 g | PACK | Freq: Two times a day (BID) | ORAL | Status: DC
  Administered 2024-09-29 – 2024-09-30 (×3): 17 g via ORAL
  Filled 2024-09-28 (×3): qty 1

## 2024-09-28 MED ORDER — SODIUM CHLORIDE 0.9 % IV BOLUS
500.0000 mL | Freq: Once | INTRAVENOUS | Status: AC
  Administered 2024-09-28: 500 mL via INTRAVENOUS

## 2024-09-28 MED ORDER — HEPARIN (PORCINE) 25000 UT/250ML-% IV SOLN
1600.0000 [IU]/h | INTRAVENOUS | Status: AC
  Administered 2024-09-28: 1600 [IU]/h via INTRAVENOUS
  Administered 2024-09-28: 1500 [IU]/h via INTRAVENOUS
  Administered 2024-09-28: 1600 [IU]/h via INTRAVENOUS
  Filled 2024-09-28 (×2): qty 250

## 2024-09-28 MED ORDER — ROSUVASTATIN CALCIUM 5 MG PO TABS
5.0000 mg | ORAL_TABLET | ORAL | Status: DC
  Administered 2024-09-30 – 2024-10-10 (×6): 5 mg via ORAL
  Filled 2024-09-28 (×7): qty 1

## 2024-09-28 MED ORDER — TRAZODONE HCL 50 MG PO TABS
25.0000 mg | ORAL_TABLET | Freq: Every evening | ORAL | Status: DC | PRN
  Administered 2024-10-01 – 2024-10-10 (×9): 25 mg via ORAL
  Filled 2024-09-28 (×10): qty 1

## 2024-09-28 MED ORDER — OXYCODONE HCL 5 MG PO TABS
5.0000 mg | ORAL_TABLET | ORAL | Status: DC | PRN
  Administered 2024-09-28 (×2): 10 mg via ORAL
  Administered 2024-09-29 (×2): 5 mg via ORAL
  Filled 2024-09-28 (×2): qty 2
  Filled 2024-09-28 (×2): qty 1

## 2024-09-28 MED ORDER — SODIUM CHLORIDE 0.9 % IV SOLN
2.0000 g | Freq: Once | INTRAVENOUS | Status: AC
  Administered 2024-09-28: 2 g via INTRAVENOUS
  Filled 2024-09-28: qty 12.5

## 2024-09-28 MED ORDER — FENTANYL CITRATE (PF) 50 MCG/ML IJ SOSY
50.0000 ug | PREFILLED_SYRINGE | Freq: Once | INTRAMUSCULAR | Status: AC
  Administered 2024-09-28: 50 ug via INTRAVENOUS
  Filled 2024-09-28: qty 1

## 2024-09-28 MED ORDER — AMIODARONE HCL IN DEXTROSE 360-4.14 MG/200ML-% IV SOLN
60.0000 mg/h | INTRAVENOUS | Status: AC
  Administered 2024-09-28 (×2): 60 mg/h via INTRAVENOUS
  Filled 2024-09-28 (×2): qty 200

## 2024-09-28 MED ORDER — ACETAMINOPHEN 325 MG PO TABS: 650.0000 mg | ORAL_TABLET | ORAL | Status: DC | PRN

## 2024-09-28 MED ORDER — DILTIAZEM HCL-DEXTROSE 125-5 MG/125ML-% IV SOLN (PREMIX): 5.0000 mg/h | INTRAVENOUS | Status: DC

## 2024-09-28 MED ORDER — MORPHINE SULFATE (PF) 2 MG/ML IV SOLN
2.0000 mg | INTRAVENOUS | Status: DC | PRN
  Administered 2024-09-28 (×4): 2 mg via INTRAVENOUS
  Filled 2024-09-28 (×5): qty 1

## 2024-09-28 MED ORDER — DIGOXIN 0.25 MG/ML IJ SOLN
0.2500 mg | Freq: Once | INTRAMUSCULAR | Status: AC
  Administered 2024-09-28: 0.25 mg via INTRAVENOUS
  Filled 2024-09-28: qty 2

## 2024-09-28 NOTE — Plan of Care (Signed)
   Problem: Education: Goal: Knowledge of General Education information will improve Description: Including pain rating scale, medication(s)/side effects and non-pharmacologic comfort measures Outcome: Progressing   Problem: Safety: Goal: Ability to remain free from injury will improve Outcome: Progressing   Problem: Skin Integrity: Goal: Risk for impaired skin integrity will decrease Outcome: Progressing

## 2024-09-28 NOTE — Assessment & Plan Note (Addendum)
 Continue levothyroxine 

## 2024-09-28 NOTE — Progress Notes (Addendum)
 Progress Note  Patient Name: Lance Hubbard Date of Encounter: 09/28/2024 [facility] Cardiologist: None   Interval Summary   Continues to have severe abdominal pain. Denies chest pain, shortness of breath, orthopnea, pnd, melena, hematinuria, and hematochezia.  Vital Signs Vitals:   09/28/24 0630 09/28/24 0711 09/28/24 0715 09/28/24 0801  BP: 96/79  104/78 106/80  Pulse: (!) 143  (!) 148 (!) 149  Resp: (!) 30  (!) 24 (!) 23  Temp:  97.6 F (36.4 C)    TempSrc:  Oral    SpO2: 98%  100% 99%  Weight:      Height:        Intake/Output Summary (Last 24 hours) at 09/28/2024 0835 Last data filed at 09/28/2024 0249 Gross per 24 hour  Intake 600 ml  Output --  Net 600 ml      09/27/2024    9:47 PM 09/22/2024   10:53 AM 09/20/2024    7:33 PM  Last 3 Weights  Weight (lbs) 220 lb 220 lb 220 lb  Weight (kg) 99.791 kg 99.791 kg 99.791 kg      Telemetry/ECG  Atrial fibrillation with ventricular rates in the 130-140's  - Personally Reviewed  Physical Exam  GEN: No acute distress.  Alert and orientated on room air. Neck: No JVD Cardiac: Irregularly irregular rhythm, no murmurs, rubs, or gallops.  Respiratory: Clear to auscultation bilaterally. Has difficult taking in deep breath due to abdominal pain. GI: Soft, nontender, non-distended  MS: No edema  Assessment & Plan  Lance Hubbard is a 83 y.o. 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  Persistent atrial fibrillation with RVR CHA2DS2-VASc Score = 3 [CHF History: 0 (EF normalized), HTN History: 1, Diabetes History: 0, Stroke History: 0, Vascular Disease History: 0, Age Score: 2, Gender Score: 0].  Therefore, the patient's annual risk of stroke is 3.2 %. The patient was initially diagnosed with atrial fibrillation in 2015.  Had ablation on 05/2021 and cardioversion on 12/2023.  Was seen by Dr. Inocencio on  06/2024.  A second ablation was considered but planned to continue current management for now.  Prior to admission was on Eliquis . In the setting of likely sepsis possibly from intra-abdominal source.  CT abdomen also mention concerns for right lower lobe pneumonia or atelectasis and abscess after recent cholecystectomy.  There were also concerns of hypotension.  Issues have improved but are still soft.  Most recent BP was 106/80.  Was started on IV heparin  and IV amiodarone  with bolus.  Was also started on IV antibiotics Suspect atrial fibrillation was likely provoked by sepsis. K 4.0, Mg 1.9. TSH WNL. Order chest x-ray. Will avoid calcium channel blockers due to reduced LVEF. Continue IV heparin . Transition back to eliquis  when cleared to do so by surgery. Continue IV amiodarone    HFrEF Hypertension TTE on 11/25 showed a reduced LVEF of 30 to 35%.  Global hypokinesis, elevated pulmonary artery systolic pressure of 65.1 mmHg, trivial MR, and aortic valve calcification without any evidence of stenosis. Will prioritize rate control over her GDMT. Hold home entresto  Suspect will need outpatient ischemic evaluation.   Abdominal abscess Recent cholecystectomy Sepsis Patient recently had a laparoscopic cholecystectomy on 09/22/2024. Presented for concerns of abdominal pain.  Has leukocytosis of 14.8 CT abdomen and pelvis mentions concerns for a possible abscess. On IV antibiotics per primary. Management per primary and  surgery.   Coronary artery calcifications Hyperlipidemia Cardiac CT on 04/2021 for a-fib ablation showed coronary calcifications with a score of 419. Denies any chest pain or shortness of breath.  Lipid panel on 05/2024 showed LDL of 52. Suspect will need outpatient ischemic evaluation. Continue home crestor 5mg  daily. Surgery or primary may hold if concerned for elevated LFT's  Otherwise management per primary.     For questions or updates, please contact [facility] Please consult [email or web address] for contact info under         Signed, Sylvester Minton, PA-C

## 2024-09-28 NOTE — Progress Notes (Signed)
 PROGRESS NOTE  Lance Hubbard FMW:[id number] DOB: [birth year] DOA: 09/27/2024 PCP: Frederik Charleston, MD  HPI/Recap of past 24 hours: Lance Hubbard is a 83 y.o. male with medical history significant for hypertension, chronic atrial fibrillation, CML and osteoarthritis, who presented to the ER with acute onset of severe upper abdominal pain with associated hypotension. Pt is s/p laparoscopic cholecystectomy on 11/28, now reporting subsequent severe epigastric pain radiating to his right lateral abdomen. In the ED, BP was 118/77, HR 171, RR 26. Labs revealed lipase 146 AST 69 ALT 102, total bili of 3.9 up from 1.4, WBC 16.8. EKG showed atrial fibrillation with RVR, PVCs and low voltage QRS.  CT abdomen pelvis showed 2 stones in the distal CBD, with third gallstone within the duodenum all measuring about 6 mm, noted interval change of cholecystectomy with a 6.7 x 2.1 cm fluid collection in the gallbladder fossa, adjacent fat stranding, and fluid tracking inferiorly in the pericolic gutter. Findings may be postoperative however are concerning for developing abscess vs concern for biliary leak, HIDA scan is recommended for further evaluation. Moderate right pleural effusion and atelectasis or pneumonia in the right lower lobe, moderate to large colonic stool burden.  Cardiology, GI and general surgery consulted.  Patient admitted for further management.    Today, patient still complaining of significant abdominal pain worse around right upper and lower quadrants.  Denies any nausea/vomiting, fever/chills.   Assessment/Plan: Principal Problem:   Atrial fibrillation with RVR (HCC) Active Problems:   Elevated LFTs   CML (chronic myelocytic leukemia) (HCC)   Chronic HFrEF (heart failure with reduced ejection fraction) (HCC)   Dyslipidemia   Hypothyroidism   Family history of CML (chronic monocytic leukemia)   Coronary artery calcification seen on CAT scan   Pure hypercholesterolemia  Sepsis likely  choledocholithiasis Elevated LFTs ??Intra-abdominal abscess On presentation, noted to be tachycardic, tachypneic, hypotension with leukocytosis Currently afebrile, with leukocytosis BC x 2 pending Noted elevated LFTs including T. Bili CT abdomen/pelvis showed 2 stones in the distal CBD, with third gallstone within the duodenum all measuring about 6 mm, noted interval change of cholecystectomy with a 6.7 x 2.1 cm fluid collection in the gallbladder fossa, adjacent fat stranding, and fluid tracking inferiorly in the pericolic gutter. Findings may be postoperative however are concerning for developing abscess vs concern for biliary leak, HIDA scan is recommended for further evaluation GI consulted, plan for possible ERCP on 09/29/2024 Continue IV cefepime , Flagyl , vancomycin  Continue IV fluids cautiously given CHF, reduced to 100 cc/h, adjust as needed Pain management Monitor closely  AKI Hyponatremia- chronic Creatinine baseline around 0.9, on admission 1.39 Continue cautious IV fluid given CHF Daily CMP  Chronic atrial fibrillation with RVR Heart rate uncontrolled Cardiology consulted, started on IV amiodarone  drip, heparin  Telemetry   Chronic systolic CHF BNP 505, no previous to compare Chest x-ray showed cardiomegaly, with small right pleural effusion Last echo done 08/2024 showed EF of 30 to 35%, global hypokinesis, 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   Weight as of this encounter: 99.8 kg.     Code Status: Full  Family  Communication: Wife at bedside  Disposition Plan: Status is:  Inpatient Remains inpatient appropriate because: Level of care      Consultants: Cardiology General Surgery GI  Procedures: None  Antimicrobials: Cefepime Flagyl  Vancomycin   DVT prophylaxis: IV heparin    Objective: Vitals:   09/28/24 1030 09/28/24 1117 09/28/24 1120 09/28/24 1212  BP: 98/77  93/77 (!) 101/58  Pulse: (!) 136  (!) 108 (!) 139  Resp: (!) 21  (!) 21   Temp:  99.3 F (37.4 C)    TempSrc:    Oral  SpO2: 98%  98% 97%  Weight:      Height:        Intake/Output Summary (Last 24 hours) at 09/28/2024 1328 Last data filed at 09/28/2024 0249 Gross per 24 hour  Intake 600 ml  Output --  Net 600 ml   Filed Weights   09/27/24 2147  Weight: 99.8 kg    Exam: General: NAD Cardiovascular: S1, S2 present Respiratory: Diminished breath sounds bilaterally Abdomen: Soft, tender, mildly distended, bowel sounds present Musculoskeletal: No bilateral pedal edema noted Skin: Normal Psychiatry: Normal mood    Data Reviewed: CBC: Recent Labs  Lab 09/22/24 0528 09/23/24 0431 09/24/24 0318 09/27/24 2132 09/28/24 0434  WBC 11.2* 11.3* 11.5* 16.8* 14.8*  NEUTROABS  --   --   --  14.2*  --   HGB 11.8* 11.9* 11.7* 12.4* 12.1*  HCT 35.0* 34.9* 34.2* 36.6* 35.6*  MCV 86.4 86.6 87.2 85.7 86.0  PLT 186 192 182 311 305   Basic Metabolic Panel: Recent Labs  Lab 09/22/24 0528 09/23/24 0431 09/24/24 0318 09/27/24 2132 09/28/24 0434  NA 133* 133* 133* 130* 131*  K 3.7 3.9 3.8 3.7 4.0  CL 100 101 103 98 103  CO2 21* 22 22 25  21*  GLUCOSE 76 103* 107* 125* 108*  BUN 13 12 13 10 12   CREATININE 0.90 1.02 0.89 1.39* 1.27*  CALCIUM 8.3* 8.0* 8.0* 8.3* 8.0*  MG 2.0 2.0 2.0 1.9  --    GFR: Estimated Creatinine Clearance: 56.6 mL/min (A) (by C-G formula based on SCr of 1.27 mg/dL (H)). Liver Function Tests: Recent Labs  Lab 09/22/24 0528 09/23/24 0431 09/27/24 2132 09/28/24 1219  AST 72* 58* 69* 63*  ALT 196* 164* 102* 99*  ALKPHOS 136* 110 213* 218*   BILITOT 1.5* 1.4* 3.9* 2.1*  PROT 6.0* 5.9* 6.0* 6.0*  ALBUMIN 3.3* 3.2* 2.8* 2.7*   Recent Labs  Lab 09/22/24 0528 09/27/24 2132  LIPASE 133* 146*   No results for input(s): AMMONIA in the last 168 hours. Coagulation Profile: Recent Labs  Lab 09/22/24 0528 09/27/24 2132  INR 1.3* 1.5*   Cardiac Enzymes: No results for input(s): CKTOTAL, CKMB, CKMBINDEX, TROPONINI in the last 168 hours. BNP (last 3 results) No results for input(s): PROBNP in the last 8760 hours. HbA1C: No results for input(s): HGBA1C in the last 72 hours. CBG: No results for input(s): GLUCAP in the last 168 hours. Lipid Profile: No results for input(s): CHOL, HDL, LDLCALC, TRIG, CHOLHDL, LDLDIRECT in the last 72 hours. Thyroid  Function Tests: Recent Labs    09/28/24 0250  TSH 3.099   Anemia Panel: No results for input(s): VITAMINB12, FOLATE, FERRITIN, TIBC, IRON, RETICCTPCT in the last 72 hours. Urine analysis:    Component Value Date/Time   COLORURINE AMBER (A) 09/27/2024 2348   APPEARANCEUR HAZY (A) 09/27/2024 2348   LABSPEC >1.046 (H) 09/27/2024 2348   PHURINE 5.0 09/27/2024 2348   GLUCOSEU NEGATIVE 09/27/2024 2348  HGBUR NEGATIVE 09/27/2024 2348   BILIRUBINUR SMALL (A) 09/27/2024 2348   KETONESUR NEGATIVE 09/27/2024 2348   PROTEINUR 30 (A) 09/27/2024 2348   NITRITE NEGATIVE 09/27/2024 2348   LEUKOCYTESUR NEGATIVE 09/27/2024 2348   Sepsis Labs: @LABRCNTIP (procalcitonin:4,lacticidven:4)  ) Recent Results (from the past 240 hours)  Surgical pcr screen     Status: None   Collection Time: 09/22/24  8:58 AM   Specimen: Nasal Mucosa; Nasal Swab  Result Value Ref Range Status   MRSA, PCR NEGATIVE NEGATIVE Final   Staphylococcus aureus NEGATIVE NEGATIVE Final    Comment: (NOTE) The Xpert SA Assay (FDA approved for NASAL specimens in patients 68 years of age and older), is one component of a comprehensive surveillance program. It is not intended to  diagnose infection nor to guide or monitor treatment. Performed at [facility], [address] [place], [place] [address]   Blood culture (routine x 2)     Status: None (Preliminary result)   Collection Time: 09/27/24 10:09 PM   Specimen: BLOOD RIGHT ARM  Result Value Ref Range Status   Specimen Description BLOOD RIGHT ARM  Final   Special Requests   Final    BOTTLES DRAWN AEROBIC AND ANAEROBIC Blood Culture adequate volume   Culture   Final    NO GROWTH < 12 HOURS Performed at [facility], [address] [place], [place] [address]    Report Status PENDING  Incomplete  Blood culture (routine x 2)     Status: None (Preliminary result)   Collection Time: 09/27/24 10:09 PM   Specimen: BLOOD RIGHT HAND  Result Value Ref Range Status   Specimen Description BLOOD RIGHT HAND  Final   Special Requests   Final    BOTTLES DRAWN AEROBIC AND ANAEROBIC Blood Culture adequate volume   Culture   Final    NO GROWTH < 12 HOURS Performed at [facility], [address] [place], [place] [address]    Report Status PENDING  Incomplete  Resp panel by RT-PCR (RSV, Flu A&B, Covid) Anterior Nasal Swab     Status: None   Collection Time: 09/27/24 10:21 PM   Specimen: Anterior Nasal Swab  Result Value Ref Range Status   SARS Coronavirus 2 by RT PCR NEGATIVE NEGATIVE Final   Influenza A by PCR NEGATIVE NEGATIVE Final   Influenza B by PCR NEGATIVE NEGATIVE Final    Comment: (NOTE) The Xpert Xpress SARS-CoV-2/FLU/RSV plus assay is intended as an aid in the diagnosis of influenza from Nasopharyngeal swab specimens and should not be used as a sole basis for treatment. Nasal washings and aspirates are unacceptable for Xpert Xpress SARS-CoV-2/FLU/RSV testing.  Fact Sheet for Patients: bloggercourse.com  Fact Sheet for Healthcare Providers: seriousbroker.it  This test is not yet approved or cleared by the [place]  FDA and has  been authorized for detection and/or diagnosis of SARS-CoV-2 by FDA under an Emergency Use Authorization (EUA). This EUA will remain in effect (meaning this test can be used) for the duration of the COVID-19 declaration under Section 564(b)(1) of the Act, 21 U.S.C. section 360bbb-3(b)(1), unless the authorization is terminated or revoked.     Resp Syncytial Virus by PCR NEGATIVE NEGATIVE Final    Comment: (NOTE) Fact Sheet for Patients: bloggercourse.com  Fact Sheet for Healthcare Providers: seriousbroker.it  This test is not yet approved or cleared by the [place]  FDA and has been authorized for detection and/or diagnosis of SARS-CoV-2 by FDA under an Emergency Use Authorization (  EUA). This EUA will remain in effect (meaning this test can be used) for the duration of the COVID-19 declaration under Section 564(b)(1) of the Act, 21 U.S.C. section 360bbb-3(b)(1), unless the authorization is terminated or revoked.  Performed at [facility], [address] [place], [place] [address]       Studies: DG CHEST PORT 1 VIEW Result Date: 09/28/2024 CLINICAL DATA:  AFib. EXAM: PORTABLE CHEST 1 VIEW COMPARISON:  CT abdomen/pelvis dated 09/27/2024. Chest radiograph 09/22/2019. FINDINGS: Low lung volumes. Cardiomegaly. Redemonstrated small layering right pleural effusion with fluid in the minor fissure. Left lung appears clear. No pneumothorax. No acute osseous abnormality. IMPRESSION: 1. Redemonstrated small layering right pleural effusion. 2. Cardiomegaly. Electronically Signed   By: Harrietta Sherry M.D.   On: 09/28/2024 11:21   CT ABDOMEN PELVIS W CONTRAST Addendum Date: 09/27/2024 ADDENDUM: Upon further review and discussion with Dr. Teresa, there are two gallstones both measuring 6 mm in the distal common bile duct at the ampulla of Vater ( series 6 image 66). Likely a 3rd gallstone within the duodenum measuring 6 mm ( 6 / 68 ).  Mild intrahepatic biliary dilation. Electronically signed by: Norman Gatlin MD 09/27/2024 11:45 PM EST RP Workstation: [id number]   Result Date: 09/27/2024 ORIGINAL REPORT EXAM: CT ABDOMEN AND PELVIS WITH CONTRAST 09/27/2024 10:59:59 PM TECHNIQUE: CT of the abdomen and pelvis was performed with the administration of intravenous contrast. 75 mL iohexol  (OMNIPAQUE ) 350 MG/ML injection was administered. Multiplanar reformatted images are provided for review. Automated exposure control, iterative reconstruction, and/or weight-based adjustment of the mA/kV was utilized to reduce the radiation dose to as low as reasonably achievable. COMPARISON: None available. CLINICAL HISTORY: Abdominal pain, post-op. FINDINGS: LOWER CHEST: Round consolidation in the right lower lobe. Associated moderate right pleural effusion. LIVER: The liver is unremarkable. GALLBLADDER AND BILE DUCTS: Interval change of cholecystectomy. There is a 6.7 x 2.1 cm fluid collection in the gallbladder fossa. Adjacent fat stranding and fluid tracking inferiorly in the pericolic gutter. SPLEEN: No acute abnormality. PANCREAS: No acute abnormality. ADRENAL GLANDS: No acute abnormality. KIDNEYS, URETERS AND BLADDER: No stones in the kidneys or ureters. No hydronephrosis. No perinephric or periureteral stranding. Urinary bladder is unremarkable. GI AND BOWEL: Stomach demonstrates no acute abnormality. There is no bowel obstruction. Moderate to large stool burden in the right colon. Wall thickening and hyper-enhancement of the 2nd and 3rd portions of the duodenum. PERITONEUM AND RETROPERITONEUM: No ascites. No free air. Fluid tracking inferiorly in the pericolic gutter. VASCULATURE: Aorta is normal in caliber. LYMPH NODES: No lymphadenopathy. REPRODUCTIVE ORGANS: Enlarged prostate. BONES AND SOFT TISSUES: No acute osseous abnormality. Small subcutaneous nodules and loculated gas in the anterior subcutaneous fat likely due to injections. Moderate right and  small left fat-containing inguinal hernias. IMPRESSION: 1. Interval change of cholecystectomy with a 6.7 x 2.1 cm fluid collection in the gallbladder fossa, adjacent fat stranding, and fluid tracking inferiorly in the pericolic gutter. Findings may be postoperative however are concerning for developing abscess. If there is concern for biliary leak, HIDA scan is recommended for further evaluation. 2. Reactive duodenitis. 3. Moderate right pleural effusion and atelectasis or pneumonia in the right lower lobe. 4. Moderate to large colonic stool burden. 5. Enlarged prostate. Electronically signed by: Norman Gatlin MD 09/27/2024 11:18 PM EST RP Workstation: [id number]    Scheduled Meds:  docusate sodium   100 mg Oral BID   levothyroxine   25 mcg Oral QAC breakfast   polyethylene glycol  17 g Oral BID  rosuvastatin   5 mg Oral QODAY    Continuous Infusions:  amiodarone  30 mg/hr (09/28/24 1251)   ceFEPime  (MAXIPIME ) IV 2 g (09/28/24 1313)   heparin  1,500 Units/hr (09/28/24 1110)   lactated ringers  100 mL/hr at 09/28/24 1027   metronidazole  Stopped (09/28/24 1018)   vancomycin  Stopped (09/28/24 1233)     LOS: 0 days     Lance JINNY Cage, MD [facility]  If 7PM-7AM, please contact night-coverage [email or web address] 09/28/2024, 1:28 PM

## 2024-09-28 NOTE — ED Notes (Signed)
 Pt refusing to eat or drink due to the pain.

## 2024-09-28 NOTE — Progress Notes (Signed)
 Received message by bedside nurse reporting uncontrolled rates.  He is in A-fib RVR heart rate between 140-160, essentially uncontrolled since at least 1300 today.  Fortunately asymptomatic, appears to be euvolemic, not in any respiratory distress.  BP has been soft.  He is already on IV amiodarone  and IV heparin .  Will increase infusion rate to 60 mg.  Day team tomorrow can decide what he needs moving forward, has pending GI procedure tomorrow.

## 2024-09-28 NOTE — H&P (Addendum)
 [place]   PATIENT NAME: Lance Hubbard    MR#:  [id number]  DATE OF BIRTH:  Mar 22, 1941  DATE OF ADMISSION:  09/27/2024  PRIMARY CARE PHYSICIAN: Frederik Charleston, MD   Patient is coming from: Home  REQUESTING/REFERRING PHYSICIAN: Arloa Chroman, PA-C   CHIEF COMPLAINT:   Chief Complaint  Patient presents with   Abdominal Pain    HISTORY OF PRESENT ILLNESS:  Lance Hubbard is a 83 y.o. male with medical history significant for essential hypertension, chronic atrial fibrillation, CML and osteoarthritis, who presented to the emergency room with acute onset of severe upper abdominal pain with associated hypotension.  She is status post laparoscopic cholecystectomy on 11/28.  Since then he has been having poor appetite and constant pain.  During the morning he ate breakfast and had 2 servings of oatmeal with subsequent severe epigastric pain radiating to his right lateral abdomen.  He describes it as a sharp pain which has been associated with dyspnea.  It was similar to gallbladder attacks.  He ate a quarter cup of chicken soup before he had the onset of severe pain.  When he called her cardiologist he was encouraged to call EMS especially given hypotension.  He admitted to nausea without vomiting.  He had low-grade fever of 99.3 without chills.  There was no radiation to her pain.  No chest pain or palpitations.  No dysuria, oliguria or hematuria or flank pain.  No other bleeding diathesis.  The patient follows with Dr. Inocencio for his EP care.  ED Course: When the patient came to the ER, BP was 118/77 with a heart rate of 171 respiratory rate of 26 and otherwise normal vital signs.  Labs revealed sodium 139 creatinine 1.39 with calcium 8.3 with alk phos of 213 albumin 2.8 lipase 146 AST 69 ALT 102 and total protein 6 with total bili of 3.9 up from 1.4 and 1129/25.  CBC showed leukocytosis 16.8 with mild anemia.  INR was 1.5 and PT 19.  TSH was 3.09 EKG as reviewed by me : EKG showed  atrial fibrillation with RVR of 153 with PVCs and low voltage QRS. Imaging: Abdominal and pelvic CT scan with contrast revealed the following: 1. Interval change of cholecystectomy with a 6.7 x 2.1 cm fluid collection in the gallbladder fossa, adjacent fat stranding, and fluid tracking inferiorly in the pericolic gutter. Findings may be postoperative however are concerning for developing abscess. If there is concern for biliary leak, HIDA scan is recommended for further evaluation. 2. Reactive duodenitis. 3. Moderate right pleural effusion and atelectasis or pneumonia in the right lower lobe. 4. Moderate to large colonic stool burden. 5. Enlarged prostate.  The patient was given 500 mL IV normal saline bolus, 4 mg of IV morphine  sulfate and 4 mg of IV Zofran  as well as 10 mg of IV Reglan 50 mcg of IV fentanyl  twice and 2 g of IV Rocephin .  He was still tachycardic and I ordered 0.25 mg of IV digoxin .  Dr. Dominica with cardiology initially recommended holding off on IV Cardizem  given the patient's soft blood pressure as well as IV amiodarone  given the patient's lack of anticoagulation given his recent procedure.  She stated that there was a blood pressure continues to be soft despite fluids and the patient is still tachycardic then the benefits of IV amiodarone  outweigh the risks especially that the patient was recently on Eliquis  before stopping it.  He was seen by the cardiologist Schwennesen and Dr.  Lonni Pizza with general surgery who thought the patient may be having missed stone and recommended GI consult.  Dr. Albertus was notified about the patient as well.  The patient will be admitted to the progressive unit bed for further evaluation and management.   PAST MEDICAL HISTORY:   Past Medical History:  Diagnosis Date   Arthritis    CML (chronic myelocytic leukemia) (HCC)    in remission   History of kidney stones    Hypertension    Persistent atrial fibrillation (HCC)    Atrial  Fibrillation    PAST SURGICAL HISTORY:   Past Surgical History:  Procedure Laterality Date   ATRIAL FIBRILLATION ABLATION N/A 05/29/2021   Procedure: ATRIAL FIBRILLATION ABLATION;  Surgeon: Kelsie Agent, MD;  Location: [facility] INVASIVE CV [facility];  Service: Cardiovascular;  Laterality: N/A;   CARDIOVERSION N/A 12/18/2013   Procedure: CARDIOVERSION;  Surgeon: Salena GORMAN Negri, MD;  Location: [facility] ENDOSCOPY;  Service: Cardiovascular;  Laterality: N/A;  9:58 Lido 40 mg, IV Propofol  40 mg, IV for anesthesia,  synched cardioversio @ 120 joules with successful NSR    CARDIOVERSION N/A 01/18/2024   Procedure: CARDIOVERSION;  Surgeon: Jeffrie Oneil BROCKS, MD;  Location: [facility] INVASIVE CV LAB;  Service: Cardiovascular;  Laterality: N/A;   CHOLECYSTECTOMY N/A 09/22/2024   Procedure: LAPAROSCOPIC CHOLECYSTECTOMY;  Surgeon: Polly Cordella LABOR, MD;  Location: [facility] OR;  Service: General;  Laterality: N/A;   COLONOSCOPY     KNEE ARTHROSCOPY     knee dsurgery     LEFT HEART CATHETERIZATION WITH CORONARY ANGIOGRAM N/A 12/19/2013   Procedure: LEFT HEART CATHETERIZATION WITH CORONARY ANGIOGRAM;  Surgeon: Rober LOISE Chroman, MD;  Location: [facility] CATH LAB;  Service: Cardiovascular;  Laterality: N/A;   LUMBAR LAMINECTOMY/DECOMPRESSION MICRODISCECTOMY N/A 05/17/2019   Procedure: Lumbar Four Lumbar Five Lumbar Laminectomy;  Surgeon: Alix Charleston, MD;  Location: [facility] OR;  Service: Neurosurgery;  Laterality: N/A;  Lumbar 4 Lumbar 5 Lumbar Laminectomy   TEE WITHOUT CARDIOVERSION N/A 12/18/2013   Procedure: TRANSESOPHAGEAL ECHOCARDIOGRAM (TEE);  Surgeon: Salena GORMAN Negri, MD;  Location: [facility] ENDOSCOPY;  Service: Cardiovascular;  Laterality: N/A;    SOCIAL HISTORY:   Social History   Tobacco Use   Smoking status: Former    Types: Pipe    Quit date: 11/08/2018    Years since quitting: 5.8   Smokeless tobacco: Never   Tobacco comments:    Former smoker 06/26/2021  Substance Use Topics   Alcohol use: Not Currently    Comment: stopped around  2012    FAMILY HISTORY:   Family History  Problem Relation Age of Onset   Heart disease Father    Hypertension Sister     DRUG ALLERGIES:   Allergies  Allergen Reactions   Amoxicillin Hives and Other (See Comments)    REACTION: whelps/pain in knees and ankles   Penicillin G     Other reaction(s): hives    REVIEW OF SYSTEMS:   ROS As per history of present illness. All pertinent systems were reviewed above. Constitutional, HEENT, cardiovascular, respiratory, GI, GU, musculoskeletal, neuro, psychiatric, endocrine, integumentary and hematologic systems were reviewed and are otherwise negative/unremarkable except for positive findings mentioned above in the HPI.   MEDICATIONS AT HOME:   Prior to Admission medications   Medication Sig Start Date End Date Taking? Authorizing Provider  acetaminophen  (TYLENOL ) 325 MG tablet Take 2 tablets (650 mg total) by mouth every 6 (six) hours as needed for mild pain (pain score 1-3) or moderate pain (pain score 4-6). 09/24/24  Arrien, Elidia Sieving, MD  apixaban  (ELIQUIS ) 5 MG TABS tablet Take 1 tablet (5 mg total) by mouth every 12 (twelve) hours. 12/20/13   Levern Hutching, MD  bosutinib (BOSULIF) 400 MG tablet Take 400 mg by mouth every evening.    [provider]  clobetasol  cream (TEMOVATE ) 0.05 % Apply 1 application topically 2 (two) times daily as needed (rash).    [provider]  docusate sodium (COLACE) 100 MG capsule Take 1 capsule (100 mg total) by mouth 2 (two) times daily. Okay to decrease to once daily or stop taking if having loose bowel movements 09/24/24 10/24/24  Arrien, Elidia Sieving, MD  levothyroxine  (SYNTHROID ) 25 MCG tablet Take 25 mcg by mouth daily before breakfast. 03/19/21   [provider]  oxyCODONE  (OXY IR/ROXICODONE ) 5 MG immediate release tablet Take 1 tablet (5 mg total) by mouth every 8 (eight) hours as needed for severe pain (pain score 7-10) (For pain not relieved with Tylenol ,  ibuprofen, rest, ice,). 09/24/24   Arrien, Mauricio Daniel, MD  rosuvastatin (CRESTOR) 5 MG tablet Take 5 mg by mouth at bedtime. Patient taking differently: Take 5 mg by mouth every other day.    [provider]  sacubitril-valsartan (ENTRESTO ) 49-51 MG Take 0.5 tablets by mouth 2 (two) times daily.    [provider]      VITAL SIGNS:  Blood pressure 106/81, pulse (!) 152, temperature 97.8 F (36.6 C), temperature source Oral, resp. rate (!) 27, height 6' 2 (1.88 m), weight 99.8 kg, SpO2 98%.  PHYSICAL EXAMINATION:  Physical Exam  GENERAL:  83 y.o.-year-old Male patient lying in the bed with no acute distress.  EYES: Pupils equal, round, reactive to light and accommodation. No scleral icterus. Extraocular muscles intact.  HEENT: Head atraumatic, normocephalic. Oropharynx and nasopharynx clear.  NECK:  Supple, no jugular venous distention. No thyroid  enlargement, no tenderness.  LUNGS: Normal breath sounds bilaterally, no wheezing, rales,rhonchi or crepitation. No use of accessory muscles of respiration.  CARDIOVASCULAR: Irregularly irregular tachycardic rhythm, S1, S2 normal. No murmurs, rubs, or gallops.  ABDOMEN: Soft, nondistended, with epigastric and right upper quadrant abdominal tenderness without rebound tenderness guarding or rigidity.. Bowel sounds present. No organomegaly or mass.  EXTREMITIES: No pedal edema, cyanosis, or clubbing.  NEUROLOGIC: Cranial nerves II through XII are intact. Muscle strength 5/5 in all extremities. Sensation intact. Gait not checked.  PSYCHIATRIC: The patient is alert and oriented x 3.  Normal affect and good eye contact. SKIN: No obvious rash, lesion, or ulcer.   LABORATORY PANEL:   CBC Recent Labs  Lab 09/28/24 0434  WBC 14.8*  HGB 12.1*  HCT 35.6*  PLT 305   ------------------------------------------------------------------------------------------------------------------  Chemistries  Recent Labs  Lab  09/27/24 2132 09/28/24 0434  NA 130* 131*  K 3.7 4.0  CL 98 103  CO2 25 21*  GLUCOSE 125* 108*  BUN 10 12  CREATININE 1.39* 1.27*  CALCIUM 8.3* 8.0*  MG 1.9  --   AST 69*  --   ALT 102*  --   ALKPHOS 213*  --   BILITOT 3.9*  --    ------------------------------------------------------------------------------------------------------------------  Cardiac Enzymes No results for input(s): TROPONINI in the last 168 hours. ------------------------------------------------------------------------------------------------------------------  RADIOLOGY:  CT ABDOMEN PELVIS W CONTRAST Addendum Date: 09/27/2024 ADDENDUM #1 : ---------------------------------------------------- Upon further review and discussion with Dr. Teresa, there are two gallstones both measuring 6 mm in the distal common bile duct at the ampulla of Vater ( series 6 image 66). Likely a 3rd  gallstone within the duodenum measuring 6 mm ( 6 / 68 ). Mild intrahepatic biliary dilation. Electronically signed by: Norman Gatlin MD 09/27/2024 11:45 PM EST RP Workstation: [id number]   Result Date: 09/27/2024 ORIGINAL REPORT EXAM: CT ABDOMEN AND PELVIS WITH CONTRAST 09/27/2024 10:59:59 PM TECHNIQUE: CT of the abdomen and pelvis was performed with the administration of intravenous contrast. 75 mL iohexol  (OMNIPAQUE ) 350 MG/ML injection was administered. Multiplanar reformatted images are provided for review. Automated exposure control, iterative reconstruction, and/or weight-based adjustment of the mA/kV was utilized to reduce the radiation dose to as low as reasonably achievable. COMPARISON: None available. CLINICAL HISTORY: Abdominal pain, post-op. FINDINGS: LOWER CHEST: Round consolidation in the right lower lobe. Associated moderate right pleural effusion. LIVER: The liver is unremarkable. GALLBLADDER AND BILE DUCTS: Interval change of cholecystectomy. There is a 6.7 x 2.1 cm fluid collection in the gallbladder fossa. Adjacent fat  stranding and fluid tracking inferiorly in the pericolic gutter. SPLEEN: No acute abnormality. PANCREAS: No acute abnormality. ADRENAL GLANDS: No acute abnormality. KIDNEYS, URETERS AND BLADDER: No stones in the kidneys or ureters. No hydronephrosis. No perinephric or periureteral stranding. Urinary bladder is unremarkable. GI AND BOWEL: Stomach demonstrates no acute abnormality. There is no bowel obstruction. Moderate to large stool burden in the right colon. Wall thickening and hyper-enhancement of the 2nd and 3rd portions of the duodenum. PERITONEUM AND RETROPERITONEUM: No ascites. No free air. Fluid tracking inferiorly in the pericolic gutter. VASCULATURE: Aorta is normal in caliber. LYMPH NODES: No lymphadenopathy. REPRODUCTIVE ORGANS: Enlarged prostate. BONES AND SOFT TISSUES: No acute osseous abnormality. Small subcutaneous nodules and loculated gas in the anterior subcutaneous fat likely due to injections. Moderate right and small left fat-containing inguinal hernias. IMPRESSION: 1. Interval change of cholecystectomy with a 6.7 x 2.1 cm fluid collection in the gallbladder fossa, adjacent fat stranding, and fluid tracking inferiorly in the pericolic gutter. Findings may be postoperative however are concerning for developing abscess. If there is concern for biliary leak, HIDA scan is recommended for further evaluation. 2. Reactive duodenitis. 3. Moderate right pleural effusion and atelectasis or pneumonia in the right lower lobe. 4. Moderate to large colonic stool burden. 5. Enlarged prostate. Electronically signed by: Norman Gatlin MD 09/27/2024 11:18 PM EST RP Workstation: [id number]     IMPRESSION AND PLAN:  Assessment and Plan: * Atrial fibrillation with RVR (HCC) - The patient will be admitted to progressive unit bed. - Will continue hydration with IV normal saline. - Will place him on IV amiodarone  per cardiology mentation. - Will optimize electrolytes and check magnesium  level.  His TSH was  normal. - The patient will be placed on IV heparin  to replace Eliquis  for now given expected procedure.  Elevated LFTs - This is concerning for missed stone with choledocholithiasis given his recent laparoscopic cholecystectomy. - GI consult will be obtained. - Dr. Albertus was notified about the patient. - The patient will be kept NPO for potential need for ERCP.  Chronic systolic CHF (congestive heart failure) (HCC) - Will continue Entresto .  CML (chronic myelocytic leukemia) (HCC) - We will hold off bosutinib given expected ERCP cut down on the risk of bleeding as well as Eliquis . - The patient will be placed on IV heparin  as mentioned above.  Dyslipidemia - We will hold off statin therapy given elevated LFTs.  Hypothyroidism - TSH came back normal. -Will continue Synthroid .     DVT prophylaxis:  IV heparin  Advanced Care Planning:  Code Status: full code. Family Communication:  The plan of care  was discussed in details with the patient (and family). I answered all questions. The patient agreed to proceed with the above mentioned plan. Further management will depend upon hospital course. Disposition Plan: Back to previous home environment Consults called: General Surgery, cardiology and GI All the records are reviewed and case discussed with ED provider.  Status is: Inpatient  At the time of the admission, it appears that the appropriate admission status for this patient is inpatient.  This is judged to be reasonable and necessary in order to provide the required intensity of service to ensure the patient's safety given the presenting symptoms, physical exam findings and initial radiographic and laboratory data in the context of comorbid conditions.  The patient requires inpatient status due to high intensity of service, high risk of further deterioration and high frequency of surveillance required.  I certify that at the time of admission, it is my clinical judgment that the  patient will require inpatient hospital care extending more than 2 midnights.                            Dispo: The patient is from: Home              Anticipated d/c is to: Home              Patient currently is not medically stable to d/c.              Difficult to place patient: No  Madison DELENA Peaches M.D on 09/28/2024 at 5:43 AM  [facility]   From 7 PM-7 AM, contact night-coverage [email or web address]  CC: Primary care physician; Frederik Charleston, MD

## 2024-09-28 NOTE — Assessment & Plan Note (Deleted)
-   The patient will be admitted to progressive unit bed. - Will continue hydration with IV normal saline. - Will place him on IV amiodarone  per cardiology mentation. - Will optimize electrolytes and check magnesium  level.  His TSH was normal. - The patient will be placed on IV heparin  to replace Eliquis  for now given expected procedure.

## 2024-09-28 NOTE — Consult Note (Addendum)
 Cardiology Consultation   Patient ID: Lance Hubbard MRN: [id number]; DOB: 1941/10/12  Admit date: 09/27/2024 Date of Consult: 09/28/2024  PCP:  Frederik Charleston, MD   [facility] Providers Cardiologist:  None  Electrophysiologist:  Will Gladis Norton, MD       Patient Profile: Lance Hubbard is a 83 y.o. male with a hx of HTN, HFrEF (EF 30-35% 08/2024), hypothyroidism, CML, HLD, tobacco use, AF s/p PVI 05/2021 who is being seen 09/28/2024 for the evaluation of atrial fibrillation with rVR at the request of Dr. Theadore.  History of Present Illness: Mr. Lance Hubbard presented today with weakness, tachycardia, and hypotension at home with recurrent right upper quadrant pain this afternoon.  He was found to have evidence of fluid collection in gallbladder fossa with suspected retained stone on CT A/P which per surgery could represent bile leak or potentially abscess.  Of note, he was just admitted 08/2024 with acute gallstone pancreatitis and underwent laparoscopic cholecystectomy 09/22/2024.  He underwent limited echo for preoperative eval (prior TTE 2020 EF 60-65%, previously as low as 40-45%) which showed EF newly reduced to 30 to 35%; he was continued on entresto  on discharge.  Workup today notable for AKI, leukocytosis, elevated bilirubin and liver enzymes, normal lactate.  EKG shows A-fib with RVR.  He has been persistently in the 130s to 160s in the ER with blood pressures ranging from the 70s over 50s to 120s over 80s.  On interview, he reports feeling well this AM, then developed severe worsening abdominal pain at 8am after eating oatmeal. He called GI who felt it was gas pain so he took tums; he then had recurrent severe pain after eating lunch. His aide then took his HR which showed rapid HR so he called cardiology who recommend ER presentation. Reports onset of dizziness today when on toilet (none now). Denies chest pain, dyspnea, syncope, BLE edema, orthopnea, palpitations. He denies any  missed doses of apixaban  at home except for evening of admission (since being in ER - last took 12/3 AM dose) though did have interruption during recent admission (on review, appears he was off AC from 11/27 though the evening of 11/29).   He follows with Dr. Norton for his EP care. Diagnosed with AF in 2015, underwent PVI 2022 with recurrent AF in 12/2023 prompting cardioversion 01/18/24 which was successful (no further AF as of office visit 07/11/24). He is currently on apixaban  5mg  BID at home (no BB or CCB).    Past Medical History:  Diagnosis Date   Arthritis    CML (chronic myelocytic leukemia) (HCC)    in remission   History of kidney stones    Hypertension    Persistent atrial fibrillation (HCC)    Atrial Fibrillation    Past Surgical History:  Procedure Laterality Date   ATRIAL FIBRILLATION ABLATION N/A 05/29/2021   Procedure: ATRIAL FIBRILLATION ABLATION;  Surgeon: Kelsie Agent, MD;  Location: [facility] INVASIVE CV LAB;  Service: Cardiovascular;  Laterality: N/A;   CARDIOVERSION N/A 12/18/2013   Procedure: CARDIOVERSION;  Surgeon: Salena GORMAN Negri, MD;  Location: [facility] ENDOSCOPY;  Service: Cardiovascular;  Laterality: N/A;  9:58 Lido 40 mg, IV Propofol  40 mg, IV for anesthesia,  synched cardioversio @ 120 joules with successful NSR    CARDIOVERSION N/A 01/18/2024   Procedure: CARDIOVERSION;  Surgeon: Jeffrie Oneil BROCKS, MD;  Location: [facility] INVASIVE CV LAB;  Service: Cardiovascular;  Laterality: N/A;   CHOLECYSTECTOMY N/A 09/22/2024   Procedure: LAPAROSCOPIC CHOLECYSTECTOMY;  Surgeon: Polly Cordella LABOR, MD;  Location: [facility] OR;  Service: General;  Laterality: N/A;   COLONOSCOPY     KNEE ARTHROSCOPY     knee dsurgery     LEFT HEART CATHETERIZATION WITH CORONARY ANGIOGRAM N/A 12/19/2013   Procedure: LEFT HEART CATHETERIZATION WITH CORONARY ANGIOGRAM;  Surgeon: Rober LOISE Chroman, MD;  Location: [facility] CATH LAB;  Service: Cardiovascular;  Laterality: N/A;   LUMBAR LAMINECTOMY/DECOMPRESSION MICRODISCECTOMY N/A  05/17/2019   Procedure: Lumbar Four Lumbar Five Lumbar Laminectomy;  Surgeon: Alix Charleston, MD;  [facility];  Service: Neurosurgery;  Laterality: N/A;  Lumbar 4 Lumbar 5 Lumbar Laminectomy   TEE WITHOUT CARDIOVERSION N/A 12/18/2013   Procedure: TRANSESOPHAGEAL ECHOCARDIOGRAM (TEE);  Surgeon: Salena GORMAN Negri, MD;  Location: [facility] ENDOSCOPY;  Service: Cardiovascular;  Laterality: N/A;     Home Medications:  Prior to Admission medications   Medication Sig Start Date End Date Taking? Authorizing Provider  acetaminophen  (TYLENOL ) 325 MG tablet Take 2 tablets (650 mg total) by mouth every 6 (six) hours as needed for mild pain (pain score 1-3) or moderate pain (pain score 4-6). 09/24/24   Arrien, Elidia Sieving, MD  apixaban  (ELIQUIS ) 5 MG TABS tablet Take 1 tablet (5 mg total) by mouth every 12 (twelve) hours. 12/20/13   Chroman Rober, MD  bosutinib (BOSULIF) 400 MG tablet Take 400 mg by mouth every evening.    [provider]  clobetasol  cream (TEMOVATE ) 0.05 % Apply 1 application topically 2 (two) times daily as needed (rash).    [provider]  docusate sodium (COLACE) 100 MG capsule Take 1 capsule (100 mg total) by mouth 2 (two) times daily. Okay to decrease to once daily or stop taking if having loose bowel movements 09/24/24 10/24/24  Arrien, Elidia Sieving, MD  levothyroxine  (SYNTHROID ) 25 MCG tablet Take 25 mcg by mouth daily before breakfast. 03/19/21   [provider]  oxyCODONE  (OXY IR/ROXICODONE ) 5 MG immediate release tablet Take 1 tablet (5 mg total) by mouth every 8 (eight) hours as needed for severe pain (pain score 7-10) (For pain not relieved with Tylenol , ibuprofen, rest, ice,). 09/24/24   Arrien, Mauricio Daniel, MD  rosuvastatin (CRESTOR) 5 MG tablet Take 5 mg by mouth at bedtime. Patient taking differently: Take 5 mg by mouth every other day.    [provider]  sacubitril-valsartan (ENTRESTO ) 49-51 MG Take 0.5 tablets by mouth 2 (two) times  daily.    [provider]    Scheduled Meds:  diltiazem   20 mg Intravenous Once   Continuous Infusions:  lactated ringers  150 mL/hr at 09/28/24 0053   metronidazole  500 mg (09/28/24 0146)   PRN Meds:   Allergies:    Allergies  Allergen Reactions   Amoxicillin Hives and Other (See Comments)    REACTION: whelps/pain in knees and ankles   Penicillin G     Other reaction(s): hives    Social History:   Social History   Socioeconomic History   Marital status: Married    Spouse name: Not on file   Number of children: Not on file   Years of education: Not on file   Highest education level: Not on file  Occupational History   Not on file  Tobacco Use   Smoking status: Former    Types: Pipe    Quit date: 11/08/2018    Years since quitting: 5.8   Smokeless tobacco: Never   Tobacco comments:    Former smoker 06/26/2021  Vaping Use   Vaping status: Never Used  Substance and Sexual Activity  Alcohol use: Not Currently    Comment: stopped around 2012   Drug use: No   Sexual activity: Not on file  Other Topics Concern   Not on file  Social History Narrative   Lives in [place] with spouse.   Retired art gallery manager in [employer]   Social Drivers of Health   Financial Resource Strain: Not on file  Food Insecurity: Patient Declined (09/21/2024)   Hunger Vital Sign    Worried About Running Out of Food in the Last Year: Patient declined    Ran Out of Food in the Last Year: Patient declined  Transportation Needs: Patient Declined (09/21/2024)   PRAPARE - Administrator, Civil Service (Medical): Patient declined    Lack of Transportation (Non-Medical): Patient declined  Physical Activity: Not on file  Stress: Not on file  Social Connections: Patient Declined (09/21/2024)   Social Connection and Isolation Panel    Frequency of Communication with Friends and Family: Patient declined    Frequency of Social Gatherings with Friends and Family: Patient  declined    Attends Religious Services: Patient declined    Database Administrator or Organizations: Patient declined    Attends Banker Meetings: Patient declined    Marital Status: Patient declined  Intimate Partner Violence: Patient Declined (09/21/2024)   Humiliation, Afraid, Rape, and Kick questionnaire    Fear of Current or Ex-Partner: Patient declined    Emotionally Abused: Patient declined    Physically Abused: Patient declined    Sexually Abused: Patient declined    Family History:   Family History  Problem Relation Age of Onset   Heart disease Father    Hypertension Sister      ROS:  Please see the history of present illness.  All other ROS reviewed and negative.     Physical Exam/Data: Vitals:   09/28/24 0100 09/28/24 0115 09/28/24 0130 09/28/24 0145  BP: 106/89 102/75 94/70 (!) 88/69  Pulse: (!) 138 (!) 153 (!) 149 (!) 148  Resp: (!) 26  (!) 21 (!) 23  Temp:      TempSrc:      SpO2: 100% 100% 99% 100%  Weight:      Height:        Intake/Output Summary (Last 24 hours) at 09/28/2024 0148 Last data filed at 09/27/2024 2118 Gross per 24 hour  Intake 500 ml  Output --  Net 500 ml      09/27/2024    9:47 PM 09/22/2024   10:53 AM 09/20/2024    7:33 PM  Last 3 Weights  Weight (lbs) 220 lb 220 lb 220 lb  Weight (kg) 99.791 kg 99.791 kg 99.791 kg     Body mass index is 28.25 kg/m.  General:  Well nourished, well developed, in no acute distress HEENT: normal Neck: no JVD Vascular: No carotid bruits; Distal pulses 2+ bilaterally Cardiac:  rapid rate, irregular rhythm. Normal S1, S2; RRR; no murmur  Lungs:  clear to auscultation bilaterally, no wheezing, rhonchi or rales  Abd: soft, distended, diffusely tender but most notably in RUQ, no hepatomegaly  Ext: no edema Musculoskeletal:  No deformities, BUE and BLE strength normal and equal Skin: warm and dry  Neuro:  CNs 2-12 intact, no focal abnormalities noted Psych:  Normal affect   EKG:  The  EKG was personally reviewed and demonstrates:  AF with rate in 150s, single PVC Telemetry:  Telemetry was personally reviewed and demonstrates:  AF with HR mostly in 140s, highest in  170s  Relevant CV Studies: TTE limited 08/2024  1. Left ventricular ejection fraction, by estimation, is 30 to 35%. The  left ventricle has moderately decreased function. The left ventricle  demonstrates global hypokinesis. Left ventricular diastolic parameters are  indeterminate. Elevated left  ventricular end-diastolic pressure.   2. Right ventricular systolic function is normal. The right ventricular  size is normal. There is severely elevated pulmonary artery systolic  pressure. The estimated right ventricular systolic pressure is 65.1 mmHg.   3. Left atrial size was moderately dilated.   4. The mitral valve is normal in structure. Trivial mitral valve  regurgitation. No evidence of mitral stenosis.   5. The aortic valve is normal in structure. There is mild calcification  of the aortic valve. Aortic valve regurgitation is not visualized. Aortic  valve sclerosis/calcification is present, without any evidence of aortic  stenosis.   6. The inferior vena cava is dilated in size with <50% respiratory  variability, suggesting right atrial pressure of 15 mmHg.   Laboratory Data: High Sensitivity Troponin:  No results for input(s): TROPONINIHS in the last 720 hours.   Chemistry Recent Labs  Lab 09/23/24 0431 09/24/24 0318 09/27/24 2132  NA 133* 133* 130*  K 3.9 3.8 3.7  CL 101 103 98  CO2 22 22 25   GLUCOSE 103* 107* 125*  BUN 12 13 10   CREATININE 1.02 0.89 1.39*  CALCIUM 8.0* 8.0* 8.3*  MG 2.0 2.0 1.9  GFRNONAA >60 >60 51*  ANIONGAP 10 8 7     Recent Labs  Lab 09/22/24 0528 09/23/24 0431 09/27/24 2132  PROT 6.0* 5.9* 6.0*  ALBUMIN 3.3* 3.2* 2.8*  AST 72* 58* 69*  ALT 196* 164* 102*  ALKPHOS 136* 110 213*  BILITOT 1.5* 1.4* 3.9*   Lipids No results for input(s): CHOL, TRIG, HDL,  LABVLDL, LDLCALC, CHOLHDL in the last 168 hours.  Hematology Recent Labs  Lab 09/23/24 0431 09/24/24 0318 09/27/24 2132  WBC 11.3* 11.5* 16.8*  RBC 4.03* 3.92* 4.27  HGB 11.9* 11.7* 12.4*  HCT 34.9* 34.2* 36.6*  MCV 86.6 87.2 85.7  MCH 29.5 29.8 29.0  MCHC 34.1 34.2 33.9  RDW 13.9 14.0 14.2  PLT 192 182 311   Thyroid  No results for input(s): TSH, FREET4 in the last 168 hours.  BNPNo results for input(s): BNP, PROBNP in the last 168 hours.  DDimer No results for input(s): DDIMER in the last 168 hours.  Radiology/Studies:  CT ABDOMEN PELVIS W CONTRAST Addendum Date: 09/27/2024 ADDENDUM #1 ADDENDUM: ---------------------------------------------------- Upon further review and discussion with Dr. Teresa, there are two gallstones both measuring 6 mm in the distal common bile duct at the ampulla of Vater ( series 6 image 66). Likely a 3rd gallstone within the duodenum measuring 6 mm ( 6 / 68 ). Mild intrahepatic biliary dilation. Electronically signed by: Norman Gatlin MD 09/27/2024 11:45 PM EST RP Workstation: [id number]   Result Date: 09/27/2024 ORIGINAL REPORT EXAM: CT ABDOMEN AND PELVIS WITH CONTRAST 09/27/2024 10:59:59 PM TECHNIQUE: CT of the abdomen and pelvis was performed with the administration of intravenous contrast. 75 mL iohexol  (OMNIPAQUE ) 350 MG/ML injection was administered. Multiplanar reformatted images are provided for review. Automated exposure control, iterative reconstruction, and/or weight-based adjustment of the mA/kV was utilized to reduce the radiation dose to as low as reasonably achievable. COMPARISON: None available. CLINICAL HISTORY: Abdominal pain, post-op. FINDINGS: LOWER CHEST: Round consolidation in the right lower lobe. Associated moderate right pleural effusion. LIVER: The liver is unremarkable. GALLBLADDER AND BILE DUCTS: Interval change  of cholecystectomy. There is a 6.7 x 2.1 cm fluid collection in the gallbladder fossa. Adjacent fat stranding  and fluid tracking inferiorly in the pericolic gutter. SPLEEN: No acute abnormality. PANCREAS: No acute abnormality. ADRENAL GLANDS: No acute abnormality. KIDNEYS, URETERS AND BLADDER: No stones in the kidneys or ureters. No hydronephrosis. No perinephric or periureteral stranding. Urinary bladder is unremarkable. GI AND BOWEL: Stomach demonstrates no acute abnormality. There is no bowel obstruction. Moderate to large stool burden in the right colon. Wall thickening and hyper-enhancement of the 2nd and 3rd portions of the duodenum. PERITONEUM AND RETROPERITONEUM: No ascites. No free air. Fluid tracking inferiorly in the pericolic gutter. VASCULATURE: Aorta is normal in caliber. LYMPH NODES: No lymphadenopathy. REPRODUCTIVE ORGANS: Enlarged prostate. BONES AND SOFT TISSUES: No acute osseous abnormality. Small subcutaneous nodules and loculated gas in the anterior subcutaneous fat likely due to injections. Moderate right and small left fat-containing inguinal hernias. IMPRESSION: 1. Interval change of cholecystectomy with a 6.7 x 2.1 cm fluid collection in the gallbladder fossa, adjacent fat stranding, and fluid tracking inferiorly in the pericolic gutter. Findings may be postoperative however are concerning for developing abscess. If there is concern for biliary leak, HIDA scan is recommended for further evaluation. 2. Reactive duodenitis. 3. Moderate right pleural effusion and atelectasis or pneumonia in the right lower lobe. 4. Moderate to large colonic stool burden. 5. Enlarged prostate. Electronically signed by: Norman Gatlin MD 09/27/2024 11:18 PM EST RP Workstation: [id number]     Assessment and Plan: Atrial fibrillation with RVR Presents in AF with RVR in 150s in the setting of likely sepsis from intra-abdominal source and severe abdominal pain. Initial BP on lower end of normal with one dip to 70/40s, now improved to 100/80s after IVF with persistent rates in 140s. Given AF triggered by likely sepsis,  rapid HR is at least somewhat compensatory in nature so would not treat aggressively to normal HR without first treating underlying cause with antibiotics and IV fluid resuscitation. With borderline low BP, would avoid all AV nodal blocking agents (especially diltiazem  given reduced EF). Could consider amiodarone  for rate and rhythm control but he is at increased risk of stroke with recent interruption in anticoagulation (CHA2DS2VASc 4). Thus, would opt to first treat underlying cause and add amiodarone  if BP drops again or if he remains in AF with RVR despite management of underlying drivers, understanding that this comes with increased risk of stroke if TEE or CT not done to clear LAA of thrombus. As such, recommend anticoagulation with IV heparin  in the interim in case rhythm control is pursued.  - Agree with IV antibiotics, recommend IVF administration (slow bolus preferred over mIVF) - Hold home apixaban  pre-op (ERCP) - Start IV heparin  for AF (no bolus) - Avoid AV nodal blocking agents, especially diltiazem  - If hemodynamics worsen, start amiodarone  load for AF (5 grams) - Consider TEE or CT to evaluate for LAA thrombus to allow for amiodarone  initiation if remains in AF   2. HFrEF (EF 30-35%) Newly reduced EF as of TTE 08/2024 during admission for gallstone pancreatitis; previous EF nadir 40-45%. Will eventually need workup for etiology of newly reduced EF and initiation of GDMT but not with acute intra-abdominal infection vs bile leak. He does not appear hypervolemic so would fluid resuscitate (albeit slowly) for sepsis.  - OK for gentle IVF resuscitation as indicated  - Hold home entresto  - Eventual ischemic eval and initiation of GDMT   Risk Assessment/Risk Scores:       [place] Heart Association (NYHA) Functional Class NYHA Class I  CHA2DS2-VASc Score = 4   This indicates a 4.8% annual risk of stroke. The patient's score is based upon: CHF History: 1 HTN History: 1 Diabetes  History: 0 Stroke History: 0 Vascular Disease History: 0 Age Score: 2 Gender Score: 0        For questions or updates, please contact [facility] Please consult [email or web address] for contact info under    Signed, Chiquita ONEIDA Sauers, MD  09/28/2024 1:48 AM

## 2024-09-28 NOTE — Progress Notes (Signed)
   09/28/24 1805  Assess: MEWS Score  Temp 98.9 F (37.2 C)  BP 107/79  MAP (mmHg) 88  Pulse Rate (!) 138  ECG Heart Rate (!) 142  Level of Consciousness Alert  SpO2 97 %  O2 Device Room Air  Patient Activity (if Appropriate) In bed  Assess: MEWS Score  MEWS Temp 0  MEWS Systolic 0  MEWS Pulse 3  MEWS RR 1  MEWS LOC 0  MEWS Score 4  MEWS Score Color Red  Assess: if the MEWS score is Yellow or Red  Were vital signs accurate and taken at a resting state? Yes  Does the patient meet 2 or more of the SIRS criteria? No  MEWS guidelines implemented  Yes, red  Treat  MEWS Interventions Considered administering scheduled or prn medications/treatments as ordered  Take Vital Signs  Increase Vital Sign Frequency  Red: Q1hr x2, continue Q4hrs until patient remains green for 12hrs  Escalate  MEWS: Escalate Red: Discuss with charge nurse and notify provider. Consider notifying RRT. If remains red for 2 hours consider need for higher level of care  Notify: Charge Nurse/RN  Name of Charge Nurse/RN Notified Musician  Provider Notification  Provider Name/Title Donnamarie Chancellor MD  Date Provider Notified 09/28/24  Time Provider Notified 1817  Method of Notification  (Secure Chat)  Notification Reason Other (Comment) (Red MEWS)  Assess: SIRS CRITERIA  SIRS Temperature  0  SIRS Respirations  1  SIRS Pulse 1  SIRS WBC 0  SIRS Score Sum  2

## 2024-09-28 NOTE — Assessment & Plan Note (Addendum)
-   We will hold off statin therapy given elevated LFTs

## 2024-09-28 NOTE — Progress Notes (Signed)
 Progress Note     Subjective: Pt reports right sided abdominal pain, more so in the RLQ. Reports he has had some constipation since surgery but had a large BM a few days ago.   Objective: Vital signs in last 24 hours: Temp:  [97.6 F (36.4 C)-98.5 F (36.9 C)] 97.6 F (36.4 C) (12/04 0711) Pulse Rate:  [138-163] 149 (12/04 0801) Resp:  [11-32] 23 (12/04 0801) BP: (79-122)/(58-99) 106/80 (12/04 0801) SpO2:  [98 %-100 %] 99 % (12/04 0801) Weight:  [99.8 kg] 99.8 kg (12/03 2147)    Intake/Output from previous day: 12/03 0701 - 12/04 0700 In: 600 [I.V.:500; IV Piggyback:100] Out: -  Intake/Output this shift: No intake/output data recorded.  PE: General: pleasant, WD, overweight male who is laying in bed in NAD HEENT: sclera anicteric Heart: rate in the 130s Lungs: Respiratory effort nonlabored Abd: soft, ttp in RUQ and RLQ without peritonitis, incisions C/D/I, moderately distended  Psych: A&Ox3 with an appropriate affect.    Lab Results:  Recent Labs    09/27/24 2132 09/28/24 0434  WBC 16.8* 14.8*  HGB 12.4* 12.1*  HCT 36.6* 35.6*  PLT 311 305   BMET Recent Labs    09/27/24 2132 09/28/24 0434  NA 130* 131*  K 3.7 4.0  CL 98 103  CO2 25 21*  GLUCOSE 125* 108*  BUN 10 12  CREATININE 1.39* 1.27*  CALCIUM 8.3* 8.0*   PT/INR Recent Labs    09/27/24 2132  LABPROT 19.0*  INR 1.5*   CMP     Component Value Date/Time   NA 131 (L) 09/28/2024 0434   NA 137 05/06/2021 1108   K 4.0 09/28/2024 0434   CL 103 09/28/2024 0434   CO2 21 (L) 09/28/2024 0434   GLUCOSE 108 (H) 09/28/2024 0434   BUN 12 09/28/2024 0434   BUN 16 05/06/2021 1108   CREATININE 1.27 (H) 09/28/2024 0434   CREATININE 1.04 09/19/2019 1030   CALCIUM 8.0 (L) 09/28/2024 0434   PROT 6.0 (L) 09/27/2024 2132   ALBUMIN 2.8 (L) 09/27/2024 2132   AST 69 (H) 09/27/2024 2132   AST 28 09/19/2019 1030   ALT 102 (H) 09/27/2024 2132   ALT 34 09/19/2019 1030   ALKPHOS 213 (H) 09/27/2024 2132    BILITOT 3.9 (H) 09/27/2024 2132   BILITOT 0.7 09/19/2019 1030   GFRNONAA 56 (L) 09/28/2024 0434   GFRNONAA >60 09/19/2019 1030   GFRAA >60 09/19/2019 1030   Lipase     Component Value Date/Time   LIPASE 146 (H) 09/27/2024 2132       Studies/Results: CT ABDOMEN PELVIS W CONTRAST Addendum Date: 09/27/2024 ADDENDUM #1  ADDENDUM: ---------------------------------------------------- Upon further review and discussion with Dr. Teresa, there are two gallstones both measuring 6 mm in the distal common bile duct at the ampulla of Vater ( series 6 image 66). Likely a 3rd gallstone within the duodenum measuring 6 mm ( 6 / 68 ). Mild intrahepatic biliary dilation. Electronically signed by: Norman Gatlin MD 09/27/2024 11:45 PM EST RP Workstation: [id number]   Result Date: 09/27/2024  ORIGINAL REPORT  EXAM: CT ABDOMEN AND PELVIS WITH CONTRAST 09/27/2024 10:59:59 PM TECHNIQUE: CT of the abdomen and pelvis was performed with the administration of intravenous contrast. 75 mL iohexol  (OMNIPAQUE ) 350 MG/ML injection was administered. Multiplanar reformatted images are provided for review. Automated exposure control, iterative reconstruction, and/or weight-based adjustment of the mA/kV was utilized to reduce the radiation dose to as low as reasonably achievable. COMPARISON: None available. CLINICAL HISTORY:  Abdominal pain, post-op. FINDINGS: LOWER CHEST: Round consolidation in the right lower lobe. Associated moderate right pleural effusion. LIVER: The liver is unremarkable. GALLBLADDER AND BILE DUCTS: Interval change of cholecystectomy. There is a 6.7 x 2.1 cm fluid collection in the gallbladder fossa. Adjacent fat stranding and fluid tracking inferiorly in the pericolic gutter. SPLEEN: No acute abnormality. PANCREAS: No acute abnormality. ADRENAL GLANDS: No acute abnormality. KIDNEYS, URETERS AND BLADDER: No stones in the kidneys or ureters. No hydronephrosis. No perinephric or periureteral stranding. Urinary  bladder is unremarkable. GI AND BOWEL: Stomach demonstrates no acute abnormality. There is no bowel obstruction. Moderate to large stool burden in the right colon. Wall thickening and hyper-enhancement of the 2nd and 3rd portions of the duodenum. PERITONEUM AND RETROPERITONEUM: No ascites. No free air. Fluid tracking inferiorly in the pericolic gutter. VASCULATURE: Aorta is normal in caliber. LYMPH NODES: No lymphadenopathy. REPRODUCTIVE ORGANS: Enlarged prostate. BONES AND SOFT TISSUES: No acute osseous abnormality. Small subcutaneous nodules and loculated gas in the anterior subcutaneous fat likely due to injections. Moderate right and small left fat-containing inguinal hernias. IMPRESSION: 1. Interval change of cholecystectomy with a 6.7 x 2.1 cm fluid collection in the gallbladder fossa, adjacent fat stranding, and fluid tracking inferiorly in the pericolic gutter. Findings may be postoperative however are concerning for developing abscess. If there is concern for biliary leak, HIDA scan is recommended for further evaluation. 2. Reactive duodenitis. 3. Moderate right pleural effusion and atelectasis or pneumonia in the right lower lobe. 4. Moderate to large colonic stool burden. 5. Enlarged prostate. Electronically signed by: Norman Gatlin MD 09/27/2024 11:18 PM EST RP Workstation: [id number]    Anti-infectives: Anti-infectives (From admission, onward)    Start     Dose/Rate Route Frequency Ordered Stop   09/28/24 1200  ceFEPIme (MAXIPIME) 2 g in sodium chloride  0.9 % 100 mL IVPB        2 g 200 mL/hr over 30 Minutes Intravenous Every 12 hours 09/28/24 0810     09/28/24 0815  metroNIDAZOLE  (FLAGYL ) IVPB 500 mg        500 mg 100 mL/hr over 60 Minutes Intravenous Every 12 hours 09/28/24 0803     09/28/24 0815  vancomycin  (VANCOREADY) IVPB 1500 mg/300 mL        1,500 mg 150 mL/hr over 120 Minutes Intravenous Every 24 hours 09/28/24 0810     09/28/24 0015  ceFEPIme (MAXIPIME) 2 g in sodium chloride   0.9 % 100 mL IVPB        2 g 200 mL/hr over 30 Minutes Intravenous  Once 09/28/24 0005 09/28/24 0148   09/28/24 0015  metroNIDAZOLE  (FLAGYL ) IVPB 500 mg        500 mg 100 mL/hr over 60 Minutes Intravenous  Once 09/28/24 0005 09/28/24 0249        Assessment/Plan Biliary pancreatitis   POD6 s/p laparoscopic cholecystectomy  Choledocholithiasis - CT 12/3 with 6.7x2.1 cm collection in GB fossa may be post-op but adjacent fat stranding and fluid tracking inferiorly, possible abscess vs biloma, reactive duodenitis, choledocholithiasis, moderate to large stool burden - WBC 14K from 16K - LFTs pending this AM but Tbili was 3.9 on admit - GI to consult but will need ERCP, monitor LFTs  - ERCP should be helpful in clarifying if bile leak present as well - if suggestive of this would consider IR drainage - surgical team will follow   FEN: HH diet, IVF per [facility] VTE: SCDs, hep gtt ID: [id number]   - per [facility] -  A.  Fib with RVR - cardiology consulted as well  HTN CML Osteoarthritis    LOS: 0 days    Lance Hubbard Louder, [facility] 09/28/2024, 9:01 AM Please see Amion for pager number during day hours 7:00am-4:30pm

## 2024-09-28 NOTE — Assessment & Plan Note (Signed)
 -  We will hold off bosutinib given expected ERCP cut down on the risk of bleeding as well as Eliquis . - The patient will be placed on IV heparin  as mentioned above.

## 2024-09-28 NOTE — Assessment & Plan Note (Signed)
-   Will continue Entresto .

## 2024-09-28 NOTE — ED Provider Notes (Signed)
 Signout received from Abigail Harris, Lance Hubbard.  In short patient is status post cholecystectomy 11/28 presenting with abdominal pain and hypotension.  Workup at this point is concerning for choledocholithiasis.  He appears to be in A-fib with RVR as well.  He is anticoagulated.  Has a white count of 16.8, liver enzymes are uptrending with a total bili  up to 3.9.   At time of signout is receiving antibiotic coverage with Rocephin  and Flagyl .  Receiving first dose of diltiazem . Physical Exam  BP 90/70   Pulse (!) 145   Temp 97.8 F (36.6 C) (Oral)   Resp (!) 23   Ht 6' 2 (1.88 m)   Wt 99.8 kg   SpO2 99%   BMI 28.25 kg/m   Physical Exam Vitals and nursing note reviewed.  Constitutional:      General: He is not in acute distress.    Appearance: He is well-developed.  HENT:     Head: Normocephalic and atraumatic.  Eyes:     Conjunctiva/sclera: Conjunctivae normal.  Cardiovascular:     Rate and Rhythm: Tachycardia present. Rhythm irregular.     Heart sounds: No murmur heard. Pulmonary:     Effort: Pulmonary effort is normal. No respiratory distress.     Breath sounds: Normal breath sounds.  Abdominal:     Palpations: Abdomen is soft.     Tenderness: There is abdominal tenderness.     Comments: Tenderness to right upper quadrant  Musculoskeletal:        General: No swelling.     Cervical back: Neck supple.  Skin:    General: Skin is warm and dry.     Capillary Refill: Capillary refill takes less than 2 seconds.  Neurological:     Mental Status: He is alert.  Psychiatric:        Mood and Affect: Mood normal.     Procedures  Procedures  ED Course / MDM   Clinical Course as of 09/28/24 0232  Wed Sep 27, 2024  2238 WBC(!): 16.8 [AH]  2238 Creatinine(!): 1.39 Patient's creatinine elevated appears to have an AKI [AH]  2240 Total Bilirubin(!): 3.9 [AH]  2240 AST(!): 69 [AH]  2240 ALT(!): 102 [AH]  2240 Alkaline Phosphatase(!): 213 [AH]  2240 Total Bilirubin(!):  3.9 Patient's liver enzymes, alkaline phosphatase and bilirubin trending back up [AH]  2240 Lipase(!): 146 Lipase minimally higher than it was at discharge [AH]  2245 Patient in A-fib with RVR.  Current plan is to complete a bolus of fluid if he continues to have RVR after that point we will order some calcium channel blockers for rate control [AH]  2309 Patient seen by Dr. Teresa- recommends medicine admission -  [AH]  2353 Patient's heart rate still elevated I have ordered Cardizem  I consulted with Dr. Devon who will not accept the patient until he has received treatment for his A-fib [AH]  2354 CT ABDOMEN PELVIS W CONTRAST I visualized interpreted CT abdomen pelvis which which shows choledocholithiasis [AH]  Thu Sep 28, 2024  0057 Discussed patient with cardiology, Dr. Dominica.  Agreed holding diltiazem  likely an advisable given hypotension.  Will come evaluate patient. [JT]  0140 Cardiology recommended continuing maintenance fluids and hold off on Amio given recent Eliquis  hold, and holding off on diltiazem  and beta-blockers given low EF and current hypotension [JT]    Clinical Course User Index [AH] Lance Chroman, Lance Hubbard [JT] Lance Lynwood Hubbard, Lance Hubbard   Medical Decision Making Amount and/or Complexity of Data Reviewed Labs: ordered.  Decision-making details documented in ED Course. Radiology: ordered. Decision-making details documented in ED Course. ECG/medicine tests: ordered.  Risk Prescription drug management. Decision regarding hospitalization.   Patient will be admitted, will require ERCP, Dr. Albertus with GI, made aware.  Has been seen by general surgery, Dr. Teresa.  Cardiology following with A-fib.  Admitted to hospitalist with Dr. Lawence.       Lance Lynwood Hubbard, Lance Hubbard 09/28/24 0232    Lance Ozell HERO, MD 09/28/24 [phone]

## 2024-09-28 NOTE — H&P (View-Only) (Signed)
 Consultation  Referring Provider: TRH/Mansy Primary Care Physician:  Frederik Charleston, MD Primary Gastroenterologist:  Dr.Perry norma  Reason for Consultation: Acute severe epigastric pain, choledocholithiasis  HPI: Lance Hubbard is a 83 y.o. 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  Patient is status post cholecystectomy on 09/22/2024. He had presented with acute cholecystitis, and also had elevated LFTs preoperatively.  Initial CT scan suggested stone in the distal common bile duct. He did not have IOC done at the time of lap chole as his anatomy was not favorable.  He had significant improvement in LFTs prior to discharge and ultrasound on 09/23/2024 showed CBD of 3 mm. He presented to the emergency room last evening after he had onset of acute severe epigastric pain yesterday morning which appeared to be exacerbated by eating.  It improved a bit during the day and then he tried to eat last night/chicken soup and pain became severe again.  He also had a presyncopal episode while in the bathroom, and had sensation of palpitations.  Evaluation in the ER revealed patient in A-fib with RVR rate in the 170s.  Had reported being hypotensive at home but blood pressure normal here. Cardiology is following, he is currently on amiodarone  infusion, and has been started on IV heparin   Last dose of Eliquis  was yesterday morning.  CT abdomen and pelvis-interval cholecystectomy, there is a 6.7 x 2.1 cm fluid collection in the gallbladder fossa there are some adjacent fat stranding and fluid tracking inferiorly into the paracolic gutter there is some wall thickening and hyperenhancement of the 2nd and 3rd portions of the duodenum, moderate right pleural effusion and atelectasis or pneumonia right lower lobe, moderate stool burden     Past Medical History:  Diagnosis Date    Arthritis    CML (chronic myelocytic leukemia) (HCC)    in remission   History of kidney stones    Hypertension    Persistent atrial fibrillation (HCC)    Atrial Fibrillation    Past Surgical History:  Procedure Laterality Date   ATRIAL FIBRILLATION ABLATION N/A 05/29/2021   Procedure: ATRIAL FIBRILLATION ABLATION;  Surgeon: Kelsie Agent, MD;  Location: [facility];  Service: Cardiovascular;  Laterality: N/A;   CARDIOVERSION N/A 12/18/2013   Procedure: CARDIOVERSION;  Surgeon: Salena GORMAN Negri, MD;  Location: [facility];  Service: Cardiovascular;  Laterality: N/A;  9:58 Lido 40 mg, IV Propofol  40 mg, IV for anesthesia,  synched cardioversio @ 120 joules with successful NSR    CARDIOVERSION N/A 01/18/2024   Procedure: CARDIOVERSION;  Surgeon: Jeffrie Oneil BROCKS, MD;  Location: [facility];  Service: Cardiovascular;  Laterality: N/A;   CHOLECYSTECTOMY N/A 09/22/2024   Procedure: LAPAROSCOPIC CHOLECYSTECTOMY;  Surgeon: Polly Cordella LABOR, MD;  Location: [facility] OR;  Service: General;  Laterality: N/A;   COLONOSCOPY     KNEE ARTHROSCOPY     knee dsurgery     LEFT HEART CATHETERIZATION WITH CORONARY ANGIOGRAM N/A 12/19/2013   Procedure: LEFT HEART CATHETERIZATION WITH CORONARY ANGIOGRAM;  Surgeon: Rober LOISE Chroman, MD;  Location: [facility] CATH [facility];  Service: Cardiovascular;  Laterality: N/A;   LUMBAR LAMINECTOMY/DECOMPRESSION MICRODISCECTOMY N/A 05/17/2019   Procedure: Lumbar Four Lumbar Five Lumbar Laminectomy;  Surgeon: Alix Charleston, MD;  Location[facility] OR;  Service: Neurosurgery;  Laterality: N/A;  Lumbar 4 Lumbar 5 Lumbar Laminectomy   TEE WITHOUT CARDIOVERSION N/A 12/18/2013   Procedure: TRANSESOPHAGEAL ECHOCARDIOGRAM (TEE);  Surgeon:  Salena GORMAN Negri, MD;  Location: [facility];  Service: Cardiovascular;  Laterality: N/A;    Prior to Admission medications   Medication Sig Start Date End Date Taking? Authorizing Provider  acetaminophen  (TYLENOL ) 325 MG tablet Take 2 tablets (650 mg total)  by mouth every 6 (six) hours as needed for mild pain (pain score 1-3) or moderate pain (pain score 4-6). 09/24/24  Yes Arrien, Elidia Sieving, MD  apixaban  (ELIQUIS ) 5 MG TABS tablet Take 1 tablet (5 mg total) by mouth every 12 (twelve) hours. 12/20/13  Yes Levern Hutching, MD  bosutinib (BOSULIF) 400 MG tablet Take 400 mg by mouth every evening.   Yes [provider]  levothyroxine  (SYNTHROID ) 25 MCG tablet Take 25 mcg by mouth daily before breakfast. 03/19/21  Yes [provider]  rosuvastatin (CRESTOR) 5 MG tablet Take 5 mg by mouth at bedtime. Patient taking differently: Take 5 mg by mouth every other day.   Yes [provider]  sacubitril-valsartan (ENTRESTO ) 49-51 MG Take 0.5 tablets by mouth 2 (two) times daily.   Yes [provider]  clobetasol  cream (TEMOVATE ) 0.05 % Apply 1 application topically 2 (two) times daily as needed (rash). Patient not taking: Reported on 09/28/2024    [provider]  docusate sodium (COLACE) 100 MG capsule Take 1 capsule (100 mg total) by mouth 2 (two) times daily. Okay to decrease to once daily or stop taking if having loose bowel movements Patient not taking: Reported on 09/28/2024 09/24/24 10/24/24  Arrien, Elidia Sieving, MD  oxyCODONE  (OXY IR/ROXICODONE ) 5 MG immediate release tablet Take 1 tablet (5 mg total) by mouth every 8 (eight) hours as needed for severe pain (pain score 7-10) (For pain not relieved with Tylenol , ibuprofen, rest, ice,). Patient not taking: Reported on 09/28/2024 09/24/24   Arrien, Elidia Sieving, MD    Current Facility-Administered Medications  Medication Dose Route Frequency Provider Last Rate Last Admin   acetaminophen  (TYLENOL ) tablet 650 mg  650 mg Oral Q4H PRN Mansy, Madison LABOR, MD       ALPRAZolam (XANAX) tablet 0.25 mg  0.25 mg Oral BID PRN Mansy, Jan A, MD       amiodarone  (NEXTERONE  PREMIX) 360-4.14 MG/200ML-% (1.8 mg/mL) IV infusion  60 mg/hr Intravenous Continuous Mansy, Jan A, MD  33.3 mL/hr at 09/28/24 0934 60 mg/hr at 09/28/24 0934   Followed by   amiodarone  (NEXTERONE  PREMIX) 360-4.14 MG/200ML-% (1.8 mg/mL) IV infusion  30 mg/hr Intravenous Continuous Mansy, Jan A, MD       ceFEPIme (MAXIPIME) 2 g in sodium chloride  0.9 % 100 mL IVPB  2 g Intravenous Q12H Tanda Powell ORN, RPH       diltiazem  (CARDIZEM ) injection 20 mg  20 mg Intravenous Once Harris, Abigail, PA-C       docusate sodium (COLACE) capsule 100 mg  100 mg Oral BID Mansy, Jan A, MD       heparin  ADULT infusion 100 units/mL (25000 units/250mL)  1,500 Units/hr Intravenous Continuous Laron Agent, RPH 15 mL/hr at 09/28/24 0248 1,500 Units/hr at 09/28/24 0248   lactated ringers  infusion   Intravenous Continuous Ezenduka, Nkeiruka J, MD 100 mL/hr at 09/28/24 1027 New Bag at 09/28/24 1027   levothyroxine  (SYNTHROID ) tablet 25 mcg  25 mcg Oral QAC breakfast Mansy, Jan A, MD   25 mcg at 09/28/24 9386   magnesium  hydroxide (MILK OF MAGNESIA) suspension 30 mL  30 mL Oral Daily PRN Mansy, Jan A, MD       metroNIDAZOLE  (FLAGYL ) IVPB 500  mg  500 mg Intravenous Q12H Ezenduka, Nkeiruka J, MD   Stopped at 09/28/24 1018   morphine  (PF) 2 MG/ML injection 2 mg  2 mg Intravenous Q4H PRN Mansy, Jan A, MD   2 mg at 09/28/24 1024   ondansetron  (ZOFRAN ) injection 4 mg  4 mg Intravenous Q6H PRN Mansy, Jan A, MD       oxyCODONE  (Oxy IR/ROXICODONE ) immediate release tablet 5-10 mg  5-10 mg Oral Q4H PRN Ezenduka, Nkeiruka J, MD   10 mg at 09/28/24 9177   polyethylene glycol (MIRALAX / GLYCOLAX) packet 17 g  17 g Oral BID Vicci Burnard SAUNDERS, PA-C       rosuvastatin (CRESTOR) tablet 5 mg  5 mg Oral QODAY Mansy, Jan A, MD       traZODone (DESYREL) tablet 25 mg  25 mg Oral QHS PRN Mansy, Jan A, MD       vancomycin  (VANCOREADY) IVPB 1500 mg/300 mL  1,500 mg Intravenous Q24H Tanda Powell ORN, RPH 150 mL/hr at 09/28/24 1024 1,500 mg at 09/28/24 1024   Current Outpatient Medications  Medication Sig Dispense Refill   acetaminophen  (TYLENOL )  325 MG tablet Take 2 tablets (650 mg total) by mouth every 6 (six) hours as needed for mild pain (pain score 1-3) or moderate pain (pain score 4-6).     apixaban  (ELIQUIS ) 5 MG TABS tablet Take 1 tablet (5 mg total) by mouth every 12 (twelve) hours. 60 tablet 3   bosutinib (BOSULIF) 400 MG tablet Take 400 mg by mouth every evening.     levothyroxine  (SYNTHROID ) 25 MCG tablet Take 25 mcg by mouth daily before breakfast.     rosuvastatin (CRESTOR) 5 MG tablet Take 5 mg by mouth at bedtime. (Patient taking differently: Take 5 mg by mouth every other day.)     sacubitril-valsartan (ENTRESTO ) 49-51 MG Take 0.5 tablets by mouth 2 (two) times daily.     clobetasol  cream (TEMOVATE ) 0.05 % Apply 1 application topically 2 (two) times daily as needed (rash). (Patient not taking: Reported on 09/28/2024)     docusate sodium (COLACE) 100 MG capsule Take 1 capsule (100 mg total) by mouth 2 (two) times daily. Okay to decrease to once daily or stop taking if having loose bowel movements (Patient not taking: Reported on 09/28/2024) 30 capsule 0   oxyCODONE  (OXY IR/ROXICODONE ) 5 MG immediate release tablet Take 1 tablet (5 mg total) by mouth every 8 (eight) hours as needed for severe pain (pain score 7-10) (For pain not relieved with Tylenol , ibuprofen, rest, ice,). (Patient not taking: Reported on 09/28/2024) 10 tablet 0    Allergies as of 09/27/2024 - Review Complete 09/22/2024  Allergen Reaction Noted   Amoxicillin Hives and Other (See Comments) 06/27/2008   Penicillin g  01/22/2021    Family History  Problem Relation Age of Onset   Heart disease Father    Hypertension Sister     Social History   Socioeconomic History   Marital status: Married    Spouse name: Not on file   Number of children: Not on file   Years of education: Not on file   Highest education level: Not on file  Occupational History   Not on file  Tobacco Use   Smoking status: Former    Types: Pipe    Quit date: 11/08/2018    Years  since quitting: 5.8   Smokeless tobacco: Never   Tobacco comments:    Former smoker 06/26/2021  Vaping Use   Vaping status: Never  Used  Substance and Sexual Activity   Alcohol use: Not Currently    Comment: stopped around 2012   Drug use: No   Sexual activity: Not on file  Other Topics Concern   Not on file  Social History Narrative   Lives in [place] with spouse.   Retired art gallery manager in [employer]   Social Drivers of Corporate Investment Banker Strain: Not on file  Food Insecurity: No Food Insecurity (09/28/2024)   Hunger Vital Sign    Worried About Running Out of Food in the Last Year: Never true    Ran Out of Food in the Last Year: Never true  Transportation Needs: No Transportation Needs (09/28/2024)   PRAPARE - Administrator, Civil Service (Medical): No    Lack of Transportation (Non-Medical): No  Physical Activity: Not on file  Stress: Not on file  Social Connections: Socially Integrated (09/28/2024)   Social Connection and Isolation Panel    Frequency of Communication with Friends and Family: More than three times a week    Frequency of Social Gatherings with Friends and Family: Twice a week    Attends Religious Services: More than 4 times per year    Active Member of [employer] or Organizations: Yes    Attends Engineer, Structural: More than 4 times per year    Marital Status: Married  Catering Manager Violence: Not At Risk (09/28/2024)   Humiliation, Afraid, Rape, and Kick questionnaire    Fear of Current or Ex-Partner: No    Emotionally Abused: No    Physically Abused: No    Sexually Abused: No    Review of Systems: Pertinent positive and negative review of systems were noted in the above HPI section.  All other review of systems was otherwise negative.   Physical Exam: Vital signs in last 24 hours: Temp:  [97.6 F (36.4 C)-98.5 F (36.9 C)] 97.6 F (36.4 C) (12/04 0711) Pulse Rate:  [138-163] 149 (12/04 0900) Resp:  [11-32] 22 (12/04  0900) BP: (79-122)/(58-99) 109/81 (12/04 0900) SpO2:  [98 %-100 %] 100 % (12/04 0900) Weight:  [99.8 kg] 99.8 kg (12/03 2147)   General:   Alert,  Well-developed, well-nourished elderly white male, pleasant and cooperative in NAD Head:  Normocephalic and atraumatic. Eyes:  Sclera clear, no icterus.   Conjunctiva pink. Ears:  Normal auditory acuity. Nose:  No deformity, discharge,  or lesions. Mouth:  No deformity or lesions.   Neck:  Supple; no masses or thyromegaly. Lungs:  Clear throughout to auscultation.   No wheezes, crackles, or rhonchi.  Heart: Tachy irregular regular rate and rhythm; no murmurs, clicks, rubs,  or gallops. Abdomen:  Soft, he is tender in the epigastrium and right upper quadrant but no rebound, bowel sounds are present Rectal:  Deferred  Msk:  Symmetrical without gross deformities. . Pulses:  Normal pulses noted. Extremities:  Without clubbing or edema. Neurologic:  Alert and  oriented x4;  grossly normal neurologically. Skin:  Intact without significant lesions or rashes.. Psych:  Alert and cooperative. Normal mood and affect.  Intake/Output from previous day: 12/03 0701 - 12/04 0700 In: 600 [I.V.:500; IV Piggyback:100] Out: -  Intake/Output this shift: No intake/output data recorded.  Lab Results: Recent Labs    09/27/24 2132 09/28/24 0434  WBC 16.8* 14.8*  HGB 12.4* 12.1*  HCT 36.6* 35.6*  PLT 311 305   BMET Recent Labs    09/27/24 2132 09/28/24 0434  NA 130* 131*  K 3.7 4.0  CL 98 103  CO2 25 21*  GLUCOSE 125* 108*  BUN 10 12  CREATININE 1.39* 1.27*  CALCIUM 8.3* 8.0*   LFT Recent Labs    09/27/24 2132  PROT 6.0*  ALBUMIN 2.8*  AST 69*  ALT 102*  ALKPHOS 213*  BILITOT 3.9*   PT/INR Recent Labs    09/27/24 2132  LABPROT 19.0*  INR 1.5*      IMPRESSION:  #1  83 yo white male status post laparoscopic cholecystectomy 09/22/2024 for acute cholecystitis.  Patient did have elevated LFTs preoperatively and initial CT scan  suggested a stone in the distal common bile duct. At surgery IOC not able to be done due to his anatomy.  He had significant improvement in LFTs prior to discharge and ultrasound on 09/23/2024 showed CBD of 3 mm.  Patient comes to the emergency room now with onset of acute severe epigastric and right upper quadrant pain yesterday exacerbated by eating and persistent.  He also had a presyncopal episode while in the bathroom prior to admission with sensation of palpitations.  Found to be in A-fib with RVR with rate in the 170s, there was concern for hypotension at home but blood pressures here normal  Cardiology following, now on IV heparin  and have initiated amiodarone  infusion  #2 choledocholithiasis identified on CT, LFTs again elevated with T. bili of 3.9 Patient also has a fluid collection in the gallbladder fossa tracking into the right paracolic gutter unclear if this could represent possible bile leak/early abscess.  He has been afebrile here, does have leukocytosis with WBC 14.8 here  Being covered with broad-spectrum antibiotics Feels better today but has been medicated, no nausea and vomiting and abdominal pain still present but not as severe  #3 anticoagulation-on Eliquis  last dose yesterday morning Currently on IV heparin   #4 history of ureterolithiasis #5.  Chronic myelocytic leukemia in remission  PLAN: Clear liquid diet today, n.p.o. past midnight Await blood cultures, continue IV cefepime Repeat labs in a.m. Continue to hold Eliquis  Patient has been scheduled for ERCP with stone extraction with Dr. Wilhelmenia tomorrow 09/29/2024/afternoon.  Procedure has been discussed in detail with the patient including indications risks and benefits.  We specifically discussed anesthesia risks, 3 to 4% risk of pancreatitis, bleeding, perforation and procedure failure.  He is agreeable to proceed  Will need to hold heparin  for at least 4 hours prior to procedure  GI will follow with  you     Amy Esterwood PA-C 09/28/2024, 10:39 AM    Attending physician's note  I personally saw the patient and performed a substantive portion of the medical decision making process for this encounter (including a complete performance of the key components : MDM, Hx and Exam), in conjunction with the APP.  I agree with the APP's note, impression, and  the management plan for the number and complexity of problems addressed at the encounter for the patient and take responsibility for that plan with its inherent risk of complications, morbidity, or mortality with additional input as follows.    83 year old male status post lap chole 09/22/2024 for acute cholecystitis came into ER with complaints of acute severe epigastric and right upper quadrant pain Noted residual CBD stone on CT, elevated LFT and leukocytosis  Continue antibiotics Hold Eliquis  Plan for ERCP tomorrow with Dr. Wilhelmenia N.p.o. after midnight  A-fib with RVR, currently rate controlled on amiodarone  infusion and digoxin  He is also on IV heparin , hold IV heparin  6 hours prior to the procedure  The  risks and benefits as well as alternatives of endoscopic procedure(s) have been discussed and reviewed. All questions answered. The patient agrees to proceed.   The patient's family at bedside was provided an opportunity to ask questions and all were answered. They agreed with the plan and demonstrated an understanding of the instructions.  LOIS Wilkie Mcgee , MD [phone]

## 2024-09-28 NOTE — Progress Notes (Signed)
 Pharmacy Antibiotic Note  Lance Hubbard is a 83 y.o. male admitted on 09/27/2024 with intra-abdominal infection.  Pharmacy has been consulted for cefepime and vancomycin  dosing.  Plan: Cefepime 2g q12h.  Vancomycin  1500mg  q24h eAUC: 437, Vd: 0.72, Scr: 1.27. Flagyl  per MD.  Follow culture data for de-escalation.  Monitor renal function for dose adjustments as indicated.   Height: 6' 2 (188 cm) Weight: 99.8 kg (220 lb) IBW/kg (Calculated) : 82.2  Temp (24hrs), Avg:98 F (36.7 C), Min:97.6 F (36.4 C), Max:98.5 F (36.9 C)  Recent Labs  Lab 09/22/24 0528 09/23/24 0431 09/24/24 0318 09/27/24 2132 09/27/24 2149 09/28/24 0434  WBC 11.2* 11.3* 11.5* 16.8*  --  14.8*  CREATININE 0.90 1.02 0.89 1.39*  --  1.27*  LATICACIDVEN  --   --   --   --  1.1  --     Estimated Creatinine Clearance: 56.6 mL/min (A) (by C-G formula based on SCr of 1.27 mg/dL (H)).    Allergies  Allergen Reactions   Amoxicillin Hives and Other (Hubbard Comments)    REACTION: whelps/pain in knees and ankles   Penicillin G     Other reaction(s): hives    Antimicrobials this admission: Cefepime 12/4 >>  Vancomycin  12/4 >>  Flagyl  12/4 >>   Microbiology results: 12/3 BCx:   Thank you for allowing pharmacy to be a part of this patient's care.  Powell Blush, PharmD, BCCCP  09/28/2024 8:10 AM

## 2024-09-28 NOTE — Consult Note (Addendum)
 Consultation  Referring Provider: TRH/Mansy Primary Care Physician:  Frederik Charleston, MD Primary Gastroenterologist:  Dr.Perry norma  Reason for Consultation: Acute severe epigastric pain, choledocholithiasis  HPI: Lance Hubbard is a 83 y.o. 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  Patient is status post cholecystectomy on 09/22/2024. He had presented with acute cholecystitis, and also had elevated LFTs preoperatively.  Initial CT scan suggested stone in the distal common bile duct. He did not have IOC done at the time of lap chole as his anatomy was not favorable.  He had significant improvement in LFTs prior to discharge and ultrasound on 09/23/2024 showed CBD of 3 mm. He presented to the emergency room last evening after he had onset of acute severe epigastric pain yesterday morning which appeared to be exacerbated by eating.  It improved a bit during the day and then he tried to eat last night/chicken soup and pain became severe again.  He also had a presyncopal episode while in the bathroom, and had sensation of palpitations.  Evaluation in the ER revealed patient in A-fib with RVR rate in the 170s.  Had reported being hypotensive at home but blood pressure normal here. Cardiology is following, he is currently on amiodarone  infusion, and has been started on IV heparin   Last dose of Eliquis  was yesterday morning.  CT abdomen and pelvis-interval cholecystectomy, there is a 6.7 x 2.1 cm fluid collection in the gallbladder fossa there are some adjacent fat stranding and fluid tracking inferiorly into the paracolic gutter there is some wall thickening and hyperenhancement of the 2nd and 3rd portions of the duodenum, moderate right pleural effusion and atelectasis or pneumonia right lower lobe, moderate stool burden     Past Medical History:  Diagnosis Date    Arthritis    CML (chronic myelocytic leukemia) (HCC)    in remission   History of kidney stones    Hypertension    Persistent atrial fibrillation (HCC)    Atrial Fibrillation    Past Surgical History:  Procedure Laterality Date   ATRIAL FIBRILLATION ABLATION N/A 05/29/2021   Procedure: ATRIAL FIBRILLATION ABLATION;  Surgeon: Kelsie Agent, MD;  [facility]: [facility];  Service: Cardiovascular;  Laterality: N/A;   CARDIOVERSION N/A 12/18/2013   Procedure: CARDIOVERSION;  Surgeon: Salena GORMAN Negri, MD;  Location: [facility] ENDOSCOPY;  Service: Cardiovascular;  Laterality: N/A;  9:58 Lido 40 mg, IV Propofol  40 mg, IV for anesthesia,  synched cardioversio @ 120 joules with successful NSR    CARDIOVERSION N/A 01/18/2024   Procedure: CARDIOVERSION;  Surgeon: Jeffrie Oneil BROCKS, MD;  Location: [facility];  Service: Cardiovascular;  Laterality: N/A;   CHOLECYSTECTOMY N/A 09/22/2024   Procedure: LAPAROSCOPIC CHOLECYSTECTOMY;  Surgeon: Polly Cordella LABOR, MD;  Location: [facility] OR;  Service: General;  Laterality: N/A;   COLONOSCOPY     KNEE ARTHROSCOPY     knee dsurgery     LEFT HEART CATHETERIZATION WITH CORONARY ANGIOGRAM N/A 12/19/2013   Procedure: LEFT HEART CATHETERIZATION WITH CORONARY ANGIOGRAM;  Surgeon: Rober LOISE Chroman, MD;  Location: [facility] CATH LAB;  Service: Cardiovascular;  Laterality: N/A;   LUMBAR LAMINECTOMY/DECOMPRESSION MICRODISCECTOMY N/A 05/17/2019   Procedure: Lumbar Four Lumbar Five Lumbar Laminectomy;  Surgeon: Alix Charleston, MD;  [facility] OR;  Service: Neurosurgery;  Laterality: N/A;  Lumbar 4 Lumbar 5 Lumbar Laminectomy   TEE WITHOUT CARDIOVERSION N/A 12/18/2013   Procedure: TRANSESOPHAGEAL ECHOCARDIOGRAM (TEE);  Surgeon:  Salena GORMAN Negri, MD;  Location: [facility]OS[facility];  Service: Cardiovascular;  Laterality: N/A;    Prior to Admission medications   Medication Sig Start Date End Date Taking? Authorizing Provider  acetaminophen  (TYLENOL ) 325 MG tablet Take 2 tablets (650 mg total)  by mouth every 6 (six) hours as needed for mild pain (pain score 1-3) or moderate pain (pain score 4-6). 09/24/24  Yes Arrien, Elidia Sieving, MD  apixaban  (ELIQUIS ) 5 MG TABS tablet Take 1 tablet (5 mg total) by mouth every 12 (twelve) hours. 12/20/13  Yes Levern Hutching, MD  bosutinib (BOSULIF) 400 MG tablet Take 400 mg by mouth every evening.   Yes [provider]  levothyroxine  (SYNTHROID ) 25 MCG tablet Take 25 mcg by mouth daily before breakfast. 03/19/21  Yes [provider]  rosuvastatin (CRESTOR) 5 MG tablet Take 5 mg by mouth at bedtime. Patient taking differently: Take 5 mg by mouth every other day.   Yes [provider]  sacubitril-valsartan (ENTRESTO ) 49-51 MG Take 0.5 tablets by mouth 2 (two) times daily.   Yes [provider]  clobetasol  cream (TEMOVATE ) 0.05 % Apply 1 application topically 2 (two) times daily as needed (rash). Patient not taking: Reported on 09/28/2024    [provider]  docusate sodium (COLACE) 100 MG capsule Take 1 capsule (100 mg total) by mouth 2 (two) times daily. Okay to decrease to once daily or stop taking if having loose bowel movements Patient not taking: Reported on 09/28/2024 09/24/24 10/24/24  Arrien, Elidia Sieving, MD  oxyCODONE  (OXY IR/ROXICODONE ) 5 MG immediate release tablet Take 1 tablet (5 mg total) by mouth every 8 (eight) hours as needed for severe pain (pain score 7-10) (For pain not relieved with Tylenol , ibuprofen, rest, ice,). Patient not taking: Reported on 09/28/2024 09/24/24   Arrien, Elidia Sieving, MD    Current Facility-Administered Medications  Medication Dose Route Frequency Provider Last Rate Last Admin   acetaminophen  (TYLENOL ) tablet 650 mg  650 mg Oral Q4H PRN Mansy, Madison LABOR, MD       ALPRAZolam (XANAX) tablet 0.25 mg  0.25 mg Oral BID PRN Mansy, Jan A, MD       amiodarone  (NEXTERONE  PREMIX) 360-4.14 MG/200ML-% (1.8 mg/mL) IV infusion  60 mg/hr Intravenous Continuous Mansy, Jan A, MD  33.3 mL/hr at 09/28/24 0934 60 mg/hr at 09/28/24 0934   Followed by   amiodarone  (NEXTERONE  PREMIX) 360-4.14 MG/200ML-% (1.8 mg/mL) IV infusion  30 mg/hr Intravenous Continuous Mansy, Jan A, MD       ceFEPIme (MAXIPIME) 2 g in sodium chloride  0.9 % 100 mL IVPB  2 g Intravenous Q12H Tanda Powell ORN, RPH       diltiazem  (CARDIZEM ) injection 20 mg  20 mg Intravenous Once Harris, Abigail, PA-C       docusate sodium (COLACE) capsule 100 mg  100 mg Oral BID Mansy, Jan A, MD       heparin  ADULT infusion 100 units/mL (25000 units/250mL)  1,500 Units/hr Intravenous Continuous Laron Agent, RPH 15 mL/hr at 09/28/24 0248 1,500 Units/hr at 09/28/24 0248   lactated ringers  infusion   Intravenous Continuous Ezenduka, Nkeiruka J, MD 100 mL/hr at 09/28/24 1027 New Bag at 09/28/24 1027   levothyroxine  (SYNTHROID ) tablet 25 mcg  25 mcg Oral QAC breakfast Mansy, Jan A, MD   25 mcg at 09/28/24 9386   magnesium  hydroxide (MILK OF MAGNESIA) suspension 30 mL  30 mL Oral Daily PRN Mansy, Jan A, MD       metroNIDAZOLE  (FLAGYL ) IVPB 500  mg  500 mg Intravenous Q12H Ezenduka, Nkeiruka J, MD   Stopped at 09/28/24 1018   morphine  (PF) 2 MG/ML injection 2 mg  2 mg Intravenous Q4H PRN Mansy, Jan A, MD   2 mg at 09/28/24 1024   ondansetron  (ZOFRAN ) injection 4 mg  4 mg Intravenous Q6H PRN Mansy, Jan A, MD       oxyCODONE  (Oxy IR/ROXICODONE ) immediate release tablet 5-10 mg  5-10 mg Oral Q4H PRN Ezenduka, Nkeiruka J, MD   10 mg at 09/28/24 9177   polyethylene glycol (MIRALAX / GLYCOLAX) packet 17 g  17 g Oral BID Vicci Burnard SAUNDERS, PA-C       rosuvastatin (CRESTOR) tablet 5 mg  5 mg Oral QODAY Mansy, Jan A, MD       traZODone (DESYREL) tablet 25 mg  25 mg Oral QHS PRN Mansy, Jan A, MD       vancomycin  (VANCOREADY) IVPB 1500 mg/300 mL  1,500 mg Intravenous Q24H Tanda Powell ORN, RPH 150 mL/hr at 09/28/24 1024 1,500 mg at 09/28/24 1024   Current Outpatient Medications  Medication Sig Dispense Refill   acetaminophen  (TYLENOL )  325 MG tablet Take 2 tablets (650 mg total) by mouth every 6 (six) hours as needed for mild pain (pain score 1-3) or moderate pain (pain score 4-6).     apixaban  (ELIQUIS ) 5 MG TABS tablet Take 1 tablet (5 mg total) by mouth every 12 (twelve) hours. 60 tablet 3   bosutinib (BOSULIF) 400 MG tablet Take 400 mg by mouth every evening.     levothyroxine  (SYNTHROID ) 25 MCG tablet Take 25 mcg by mouth daily before breakfast.     rosuvastatin (CRESTOR) 5 MG tablet Take 5 mg by mouth at bedtime. (Patient taking differently: Take 5 mg by mouth every other day.)     sacubitril-valsartan (ENTRESTO ) 49-51 MG Take 0.5 tablets by mouth 2 (two) times daily.     clobetasol  cream (TEMOVATE ) 0.05 % Apply 1 application topically 2 (two) times daily as needed (rash). (Patient not taking: Reported on 09/28/2024)     docusate sodium (COLACE) 100 MG capsule Take 1 capsule (100 mg total) by mouth 2 (two) times daily. Okay to decrease to once daily or stop taking if having loose bowel movements (Patient not taking: Reported on 09/28/2024) 30 capsule 0   oxyCODONE  (OXY IR/ROXICODONE ) 5 MG immediate release tablet Take 1 tablet (5 mg total) by mouth every 8 (eight) hours as needed for severe pain (pain score 7-10) (For pain not relieved with Tylenol , ibuprofen, rest, ice,). (Patient not taking: Reported on 09/28/2024) 10 tablet 0    Allergies as of 09/27/2024 - Review Complete 09/22/2024  Allergen Reaction Noted   Amoxicillin Hives and Other (See Comments) 06/27/2008   Penicillin g  01/22/2021    Family History  Problem Relation Age of Onset   Heart disease Father    Hypertension Sister     Social History   Socioeconomic History   Marital status: Married    Spouse name: Not on file   Number of children: Not on file   Years of education: Not on file   Highest education level: Not on file  Occupational History   Not on file  Tobacco Use   Smoking status: Former    Types: Pipe    Quit date: 11/08/2018    Years  since quitting: 5.8   Smokeless tobacco: Never   Tobacco comments:    Former smoker 06/26/2021  Vaping Use   Vaping status: Never  Used  Substance and Sexual Activity   Alcohol use: Not Currently    Comment: stopped around 2012   Drug use: No   Sexual activity: Not on file  Other Topics Concern   Not on file  Social History Narrative   Lives in [place] with spouse.   Retired art gallery manager in [employer]   Social Drivers of Corporate Investment Banker Strain: Not on file  Food Insecurity: No Food Insecurity (09/28/2024)   Hunger Vital Sign    Worried About Running Out of Food in the Last Year: Never true    Ran Out of Food in the Last Year: Never true  Transportation Needs: No Transportation Needs (09/28/2024)   PRAPARE - Administrator, Civil Service (Medical): No    Lack of Transportation (Non-Medical): No  Physical Activity: Not on file  Stress: Not on file  Social Connections: Socially Integrated (09/28/2024)   Social Connection and Isolation Panel    Frequency of Communication with Friends and Family: More than three times a week    Frequency of Social Gatherings with Friends and Family: Twice a week    Attends Religious Services: More than 4 times per year    Active Member of [employer] or Organizations: Yes    Attends Engineer, Structural: More than 4 times per year    Marital Status: Married  Catering Manager Violence: Not At Risk (09/28/2024)   Humiliation, Afraid, Rape, and Kick questionnaire    Fear of Current or Ex-Partner: No    Emotionally Abused: No    Physically Abused: No    Sexually Abused: No    Review of Systems: Pertinent positive and negative review of systems were noted in the above HPI section.  All other review of systems was otherwise negative.   Physical Exam: Vital signs in last 24 hours: Temp:  [97.6 F (36.4 C)-98.5 F (36.9 C)] 97.6 F (36.4 C) (12/04 0711) Pulse Rate:  [138-163] 149 (12/04 0900) Resp:  [11-32] 22 (12/04  0900) BP: (79-122)/(58-99) 109/81 (12/04 0900) SpO2:  [98 %-100 %] 100 % (12/04 0900) Weight:  [99.8 kg] 99.8 kg (12/03 2147)   General:   Alert,  Well-developed, well-nourished elderly white male, pleasant and cooperative in NAD Head:  Normocephalic and atraumatic. Eyes:  Sclera clear, no icterus.   Conjunctiva pink. Ears:  Normal auditory acuity. Nose:  No deformity, discharge,  or lesions. Mouth:  No deformity or lesions.   Neck:  Supple; no masses or thyromegaly. Lungs:  Clear throughout to auscultation.   No wheezes, crackles, or rhonchi.  Heart: Tachy irregular regular rate and rhythm; no murmurs, clicks, rubs,  or gallops. Abdomen:  Soft, he is tender in the epigastrium and right upper quadrant but no rebound, bowel sounds are present Rectal:  Deferred  Msk:  Symmetrical without gross deformities. . Pulses:  Normal pulses noted. Extremities:  Without clubbing or edema. Neurologic:  Alert and  oriented x4;  grossly normal neurologically. Skin:  Intact without significant lesions or rashes.. Psych:  Alert and cooperative. Normal mood and affect.  Intake/Output from previous day: 12/03 0701 - 12/04 0700 In: 600 [I.V.:500; IV Piggyback:100] Out: -  Intake/Output this shift: No intake/output data recorded.  Lab Results: Recent Labs    09/27/24 2132 09/28/24 0434  WBC 16.8* 14.8*  HGB 12.4* 12.1*  HCT 36.6* 35.6*  PLT 311 305   BMET Recent Labs    09/27/24 2132 09/28/24 0434  NA 130* 131*  K 3.7 4.0  CL 98 103  CO2 25 21*  GLUCOSE 125* 108*  BUN 10 12  CREATININE 1.39* 1.27*  CALCIUM 8.3* 8.0*   LFT Recent Labs    09/27/24 2132  PROT 6.0*  ALBUMIN 2.8*  AST 69*  ALT 102*  ALKPHOS 213*  BILITOT 3.9*   PT/INR Recent Labs    09/27/24 2132  LABPROT 19.0*  INR 1.5*      IMPRESSION:  #1  83 yo white male status post laparoscopic cholecystectomy 09/22/2024 for acute cholecystitis.  Patient did have elevated LFTs preoperatively and initial CT scan  suggested a stone in the distal common bile duct. At surgery IOC not able to be done due to his anatomy.  He had significant improvement in LFTs prior to discharge and ultrasound on 09/23/2024 showed CBD of 3 mm.  Patient comes to the emergency room now with onset of acute severe epigastric and right upper quadrant pain yesterday exacerbated by eating and persistent.  He also had a presyncopal episode while in the bathroom prior to admission with sensation of palpitations.  Found to be in A-fib with RVR with rate in the 170s, there was concern for hypotension at home but blood pressures here normal  Cardiology following, now on IV heparin  and have initiated amiodarone  infusion  #2 choledocholithiasis identified on CT, LFTs again elevated with T. bili of 3.9 Patient also has a fluid collection in the gallbladder fossa tracking into the right paracolic gutter unclear if this could represent possible bile leak/early abscess.  He has been afebrile here, does have leukocytosis with WBC 14.8 here  Being covered with broad-spectrum antibiotics Feels better today but has been medicated, no nausea and vomiting and abdominal pain still present but not as severe  #3 anticoagulation-on Eliquis  last dose yesterday morning Currently on IV heparin   #4 history of ureterolithiasis #5.  Chronic myelocytic leukemia in remission  PLAN: Clear liquid diet today, n.p.o. past midnight Await blood cultures, continue IV cefepime Repeat labs in a.m. Continue to hold Eliquis  Patient has been scheduled for ERCP with stone extraction with Dr. Wilhelmenia tomorrow 09/29/2024/afternoon.  Procedure has been discussed in detail with the patient including indications risks and benefits.  We specifically discussed anesthesia risks, 3 to 4% risk of pancreatitis, bleeding, perforation and procedure failure.  He is agreeable to proceed  Will need to hold heparin  for at least 4 hours prior to procedure  GI will follow with  you     Amy Esterwood PA-C 09/28/2024, 10:39 AM    Attending physician's note  I personally saw the patient and performed a substantive portion of the medical decision making process for this encounter (including a complete performance of the key components : MDM, Hx and Exam), in conjunction with the APP.  I agree with the APP's note, impression, and  the management plan for the number and complexity of problems addressed at the encounter for the patient and take responsibility for that plan with its inherent risk of complications, morbidity, or mortality with additional input as follows.    83 year old male status post lap chole 09/22/2024 for acute cholecystitis came into ER with complaints of acute severe epigastric and right upper quadrant pain Noted residual CBD stone on CT, elevated LFT and leukocytosis  Continue antibiotics Hold Eliquis  Plan for ERCP tomorrow with Dr. Wilhelmenia N.p.o. after midnight  A-fib with RVR, currently rate controlled on amiodarone  infusion and digoxin  He is also on IV heparin , hold IV heparin  6 hours prior to the procedure  The  risks and benefits as well as alternatives of endoscopic procedure(s) have been discussed and reviewed. All questions answered. The patient agrees to proceed.   The patient's family at bedside was provided an opportunity to ask questions and all were answered. They agreed with the plan and demonstrated an understanding of the instructions.  LOIS Wilkie Mcgee , MD [phone]

## 2024-09-28 NOTE — Progress Notes (Signed)
 PHARMACY - ANTICOAGULATION CONSULT NOTE  Pharmacy Consult for heparin  Indication: atrial fibrillation  Patient Measurements: Height: 6' 2 (188 cm) Weight: 99.8 kg (220 lb) IBW/kg (Calculated) : 82.2 HEPARIN  DW (KG): 99.8  Vital Signs: Temp: 99.3 F (37.4 C) (12/04 1117) Temp Source: Oral (12/04 1212) BP: 101/58 (12/04 1212) Pulse Rate: 139 (12/04 1212)  Labs: Recent Labs    09/27/24 2132 09/28/24 0434 09/28/24 1219  HGB 12.4* 12.1*  --   HCT 36.6* 35.6*  --   PLT 311 305  --   APTT  --   --  50*  LABPROT 19.0*  --   --   INR 1.5*  --   --   HEPARINUNFRC  --   --  >1.10*  CREATININE 1.39* 1.27*  --     Estimated Creatinine Clearance: 56.6 mL/min (A) (by C-G formula based on SCr of 1.27 mg/dL (H)).   Medical History: Past Medical History:  Diagnosis Date   Arthritis    CML (chronic myelocytic leukemia) (HCC)    in remission   History of kidney stones    Hypertension    Persistent atrial fibrillation [facility])    Atrial Fibrillation   Assessment: 23 yoM presented with abdominal pain and hypotension now s/p cholecystectomy on 11/28 and in afib RVR. Pharmacy has been consulted to dose heparin  for atrial fibrillation for possible ERCP.  Hgb 12s, plts WNL Apixaban  PTA for afib: last dose 12/3 AM  Heaprin drip 1500 uts/hr with heparin  level > 1.1 as expected with recent apixaban  use.  Aptt 50sec slightly < goal.   Goal of Therapy:  Heparin  level 0.3-0.7 units/ml aPTT 66-102 seconds Monitor platelets by anticoagulation protocol: Yes   Plan:  Increase heparin  infusion at 1600 units/hr Heparin  drip to stop at 0800 for ERCP 12/5 - follow up anticoagulaiton after Continue to monitor H&H and platelets Follow up ability to transition back to eliquis  after ERCP   Olam Chalk Pharm.D. CPP, BCPS Clinical Pharmacist [phone] 09/28/2024 2:26 PM

## 2024-09-28 NOTE — Assessment & Plan Note (Signed)
-   We will hold off bosutinib given expected ERCP cut down on the risk of bleeding as well as Eliquis . - The patient will be placed on IV heparin  as mentioned above.

## 2024-09-28 NOTE — TOC Initial Note (Signed)
 Transition of Care [facility]) - Initial/Assessment Note    Patient Details  Name: Lance Hubbard MRN: [id number] Date of Birth: 1941-05-11  [facility] of [facility]) CM/SW Contact:    Sudie Erminio Deems, RN Phone Number: 09/28/2024, 3:57 PM  Clinical Narrative:  Risk for readmission assessment completed. Patient presented with abdominal pain-surgery and GI consulted.  PTA patient was from home with spouse. Patient has DME cane in the home. Patient has a PCP and has no issues with transportation to appointments. Patient is not active with any HH Services. ICM will continue to follow for additional disposition needs as the patient progresses.                 Expected Discharge Plan: Home/Self Care Barriers to Discharge: Continued Medical Work up   Patient Goals and CMS Choice Patient states their goals for this hospitalization and ongoing recovery are:: Plans to retutrn home once stable          Expected Discharge Plan and Services In-house Referral: NA Discharge Planning Services: CM Consult   Living arrangements for the past 2 months: Single Family Home                   DME Agency: NA       HH Arranged: NA          Prior Living Arrangements/Services Living arrangements for the past 2 months: Single Family Home Lives with:: Spouse Patient language and need for interpreter reviewed:: Yes Do you feel safe going back to the place where you live?: Yes      Need for Family Participation in Patient Care: Yes (Comment) Care giver support system in place?: Yes (comment) Current home services: DME (cane) Criminal Activity/Legal Involvement Pertinent to Current Situation/Hospitalization: No - Comment as needed  Activities of Daily Living   ADL Screening (condition at time of admission) Independently performs ADLs?: Yes (appropriate for developmental age) Is the patient deaf or have difficulty hearing?: No Does the patient have difficulty seeing, even when wearing  glasses/contacts?: No Does the patient have difficulty concentrating, remembering, or making decisions?: No  Permission Sought/Granted Permission sought to share information with : Family Supports, Case Manager                Emotional Assessment Appearance:: Appears stated age Attitude/Demeanor/Rapport: Engaged Affect (typically observed): Appropriate Orientation: : Oriented to Self, Oriented to Place, Oriented to  Time, Oriented to Situation Alcohol / Substance Use: Not Applicable Psych Involvement: No (comment)  Admission diagnosis:  Paroxysmal atrial fibrillation (HCC) [I48.0] Atrial fibrillation with RVR (HCC) [I48.91] Patient Active Problem List   Diagnosis Date Noted   Elevated LFTs 09/28/2024   Dyslipidemia 09/28/2024   Family history of CML (chronic monocytic leukemia) 09/28/2024   Coronary artery calcification seen on CAT scan 09/28/2024   Pure hypercholesterolemia 09/28/2024   Hypothyroidism 09/24/2024   Hyponatremia 09/21/2024   Choledocholithiasis 09/21/2024   Elevated liver enzymes 09/21/2024   Anticoagulated 09/21/2024   Acute gallstone pancreatitis 09/20/2024   Paroxysmal atrial fibrillation (HCC) 04/09/2021   Secondary hypercoagulable state 04/09/2021   Weakness of both lower extremities 04/01/2021   Constipation 07/20/2019   Normocytic anemia 06/29/2019   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 [place] [address] Phone: [phone] Fax:  [phone]     Social Drivers of Health (SDOH) Social History: SDOH Screenings   Food Insecurity: No Food Insecurity (09/28/2024)  Housing: Low Risk  (09/28/2024)  Transportation Needs: No Transportation Needs (09/28/2024)  Utilities: Not At Risk (09/28/2024)  Social Connections: Socially Integrated (09/28/2024)  Tobacco Use: Medium Risk (09/22/2024)   SDOH Interventions:     Readmission Risk Interventions    09/28/2024    3:55 PM  Readmission Risk Prevention Plan  Transportation Screening Complete  HRI or Home Care Consult Complete  Social Work Consult for Recovery Care Planning/Counseling Complete  Palliative Care Screening Not Applicable  Medication Review Oceanographer) Referral to Pharmacy

## 2024-09-28 NOTE — Assessment & Plan Note (Deleted)
-   This is concerning for missed stone with choledocholithiasis given his recent laparoscopic cholecystectomy. - GI consult will be obtained. - Dr. Albertus was notified about the patient. - The patient will be kept NPO for potential need for ERCP.

## 2024-09-28 NOTE — ED Notes (Signed)
 Pt states he had diarrhea yesterday and does not want any stool softener at this time.

## 2024-09-28 NOTE — ED Provider Notes (Incomplete)
 Signout received from Abigail Harris, PA-C.  In short patient is status post cholecystectomy 11/28 presenting with abdominal pain and hypotension.  Workup at this point is concerning for choledocholithiasis.  He appears to be in A-fib with RVR as well.  He is anticoagulated.  Has a white count of 16.8, liver enzymes are uptrending with a total bili  up to 3.9. Physical Exam  BP 110/69   Pulse (!) 151   Temp 98.2 F (36.8 C) (Oral)   Resp (!) 23   Ht 6' 2 (1.88 m)   Wt 99.8 kg   SpO2 100%   BMI 28.25 kg/m   Physical Exam  Procedures  Procedures  ED Course / MDM   Clinical Course as of 09/28/24 0002  Wed Sep 27, 2024  2238 WBC(!): 16.8 [AH]  2238 Creatinine(!): 1.39 Patient's creatinine elevated appears to have an AKI [AH]  2240 Total Bilirubin(!): 3.9 [AH]  2240 AST(!): 69 [AH]  2240 ALT(!): 102 [AH]  2240 Alkaline Phosphatase(!): 213 [AH]  2240 Total Bilirubin(!): 3.9 Patient's liver enzymes, alkaline phosphatase and bilirubin trending back up [AH]  2240 Lipase(!): 146 Lipase minimally higher than it was at discharge [AH]  2245 Patient in A-fib with RVR.  Current plan is to complete a bolus of fluid if he continues to have RVR after that point we will order some calcium channel blockers for rate control [AH]  2309 Patient seen by Dr. Teresa- recommends medicine admission -  [AH]  2353 Patient's heart rate still elevated I have ordered Cardizem  I consulted with Dr. Devon who will not accept the patient until he has received treatment for his A-fib [AH]  2354 CT ABDOMEN PELVIS W CONTRAST I visualized interpreted CT abdomen pelvis which which shows choledocholithiasis [AH]    Clinical Course User Index [AH] Arloa Chroman, PA-C   Medical Decision Making Amount and/or Complexity of Data Reviewed Labs: ordered. Decision-making details documented in ED Course. Radiology: ordered. Decision-making details documented in ED Course. ECG/medicine tests:  ordered.  Risk Prescription drug management.   ***

## 2024-09-28 NOTE — Progress Notes (Addendum)
 PHARMACY - ANTICOAGULATION CONSULT NOTE  Pharmacy Consult for heparin  Indication: atrial fibrillation  Patient Measurements: Height: 6' 2 (188 cm) Weight: 99.8 kg (220 lb) IBW/kg (Calculated) : 82.2 HEPARIN  DW (KG): 99.8  Vital Signs: Temp: 98.2 F (36.8 C) (12/03 2228) Temp Source: Oral (12/03 2228) BP: 88/69 (12/04 0145) Pulse Rate: 148 (12/04 0145)  Labs: Recent Labs    09/27/24 2132  HGB 12.4*  HCT 36.6*  PLT 311  LABPROT 19.0*  INR 1.5*  CREATININE 1.39*    Estimated Creatinine Clearance: 51.7 mL/min (A) (by C-G formula based on SCr of 1.39 mg/dL (H)).   Medical History: Past Medical History:  Diagnosis Date   Arthritis    CML (chronic myelocytic leukemia) (HCC)    in remission   History of kidney stones    Hypertension    Persistent atrial fibrillation [facility])    Atrial Fibrillation   Assessment: 57 yoM presented with abdominal pain and hypotension now s/p cholecystectomy on 11/28 and in afib RVR. Pharmacy has been consulted to dose heparin  for atrial fibrillation for possible ERCP.  Hgb 12s, plts WNL Apixaban  PTA for afib: last dose 12/3 AM  Goal of Therapy:  Heparin  level 0.3-0.7 units/ml aPTT 66-102 seconds Monitor platelets by anticoagulation protocol: Yes   Plan:  No bolus given recent eliquis  use Start heparin  infusion at 1500 units/hr Check aPTT and anti-Xa level in 8 hours and daily while on heparin  Monitor with aPTTs until correlates with heparin  level Continue to monitor H&H and platelets Follow up ability to transition back to eliquis  after ERCP  Lynwood Poplar, PharmD, BCPS Clinical Pharmacist 09/28/2024 2:15 AM

## 2024-09-28 NOTE — ED Notes (Signed)
 PT requesting pain medication. Attending MD messaged via secure chat.

## 2024-09-29 ENCOUNTER — Inpatient Hospital Stay (HOSPITAL_COMMUNITY)

## 2024-09-29 ENCOUNTER — Inpatient Hospital Stay (HOSPITAL_COMMUNITY): Admitting: Anesthesiology

## 2024-09-29 ENCOUNTER — Encounter (HOSPITAL_COMMUNITY): Admission: EM | Disposition: A | Payer: Self-pay | Source: Home / Self Care | Attending: Internal Medicine

## 2024-09-29 ENCOUNTER — Encounter (HOSPITAL_COMMUNITY): Payer: Self-pay | Admitting: Family Medicine

## 2024-09-29 DIAGNOSIS — I4891 Unspecified atrial fibrillation: Secondary | ICD-10-CM | POA: Diagnosis not present

## 2024-09-29 DIAGNOSIS — K8050 Calculus of bile duct without cholangitis or cholecystitis without obstruction: Secondary | ICD-10-CM

## 2024-09-29 DIAGNOSIS — K838 Other specified diseases of biliary tract: Secondary | ICD-10-CM | POA: Diagnosis not present

## 2024-09-29 HISTORY — PX: ERCP: SHX5425

## 2024-09-29 LAB — CBC WITH DIFFERENTIAL/PLATELET
Abs Immature Granulocytes: 0.05 K/uL (ref 0.00–0.07)
Basophils Absolute: 0 K/uL (ref 0.0–0.1)
Basophils Relative: 0 %
Eosinophils Absolute: 0.1 K/uL (ref 0.0–0.5)
Eosinophils Relative: 1 %
HCT: 31.5 % — ABNORMAL LOW (ref 39.0–52.0)
Hemoglobin: 10.6 g/dL — ABNORMAL LOW (ref 13.0–17.0)
Immature Granulocytes: 1 %
Lymphocytes Relative: 10 %
Lymphs Abs: 1.1 K/uL (ref 0.7–4.0)
MCH: 29.2 pg (ref 26.0–34.0)
MCHC: 33.7 g/dL (ref 30.0–36.0)
MCV: 86.8 fL (ref 80.0–100.0)
Monocytes Absolute: 1.2 K/uL — ABNORMAL HIGH (ref 0.1–1.0)
Monocytes Relative: 12 %
Neutro Abs: 8.2 K/uL — ABNORMAL HIGH (ref 1.7–7.7)
Neutrophils Relative %: 76 %
Platelets: 269 K/uL (ref 150–400)
RBC: 3.63 MIL/uL — ABNORMAL LOW (ref 4.22–5.81)
RDW: 14.4 % (ref 11.5–15.5)
WBC: 10.7 K/uL — ABNORMAL HIGH (ref 4.0–10.5)
nRBC: 0 % (ref 0.0–0.2)

## 2024-09-29 LAB — COMPREHENSIVE METABOLIC PANEL WITH GFR
ALT: 80 U/L — ABNORMAL HIGH (ref 0–44)
AST: 38 U/L (ref 15–41)
Albumin: 2.3 g/dL — ABNORMAL LOW (ref 3.5–5.0)
Alkaline Phosphatase: 168 U/L — ABNORMAL HIGH (ref 38–126)
Anion gap: 11 (ref 5–15)
BUN: 16 mg/dL (ref 8–23)
CO2: 22 mmol/L (ref 22–32)
Calcium: 7.7 mg/dL — ABNORMAL LOW (ref 8.9–10.3)
Chloride: 98 mmol/L (ref 98–111)
Creatinine, Ser: 1.06 mg/dL (ref 0.61–1.24)
GFR, Estimated: >60 mL/min (ref >60)
Glucose, Bld: 100 mg/dL — ABNORMAL HIGH (ref 70–99)
Potassium: 3.9 mmol/L (ref 3.5–5.1)
Sodium: 131 mmol/L — ABNORMAL LOW (ref 135–145)
Total Bilirubin: 1.4 mg/dL — ABNORMAL HIGH (ref 0.0–1.2)
Total Protein: 5.6 g/dL — ABNORMAL LOW (ref 6.5–8.1)

## 2024-09-29 SURGERY — ERCP, WITH INTERVENTION IF INDICATED
Anesthesia: General

## 2024-09-29 MED ORDER — LACTATED RINGERS IV SOLN: INTRAVENOUS | Status: DC

## 2024-09-29 MED ORDER — LACTATED RINGERS IV SOLN: INTRAVENOUS | Status: DC | PRN

## 2024-09-29 MED ORDER — ROCURONIUM BROMIDE 10 MG/ML (PF) SYRINGE
PREFILLED_SYRINGE | INTRAVENOUS | Status: DC | PRN
  Administered 2024-09-29: 10 mg via INTRAVENOUS
  Administered 2024-09-29: 50 mg via INTRAVENOUS

## 2024-09-29 MED ORDER — FENTANYL CITRATE (PF) 250 MCG/5ML IJ SOLN
INTRAMUSCULAR | Status: DC | PRN
  Administered 2024-09-29 (×2): 50 ug via INTRAVENOUS

## 2024-09-29 MED ORDER — DICLOFENAC SUPPOSITORY 100 MG
RECTAL | Status: DC | PRN
  Administered 2024-09-29: 100 mg via RECTAL

## 2024-09-29 MED ORDER — INDOMETHACIN 50 MG RE SUPP: 100.0000 mg | Freq: Once | RECTAL | Status: DC

## 2024-09-29 MED ORDER — AMIODARONE LOAD VIA INFUSION
150.0000 mg | Freq: Once | INTRAVENOUS | Status: AC
  Administered 2024-09-29: 150 mg via INTRAVENOUS
  Filled 2024-09-29: qty 83.34

## 2024-09-29 MED ORDER — DEXAMETHASONE SOD PHOSPHATE PF 10 MG/ML IJ SOLN
INTRAMUSCULAR | Status: DC | PRN
  Administered 2024-09-29: 10 mg via INTRAVENOUS

## 2024-09-29 MED ORDER — VASOPRESSIN 20 UNIT/ML IV SOLN
INTRAVENOUS | Status: DC | PRN
  Administered 2024-09-29 (×2): 1 [IU] via INTRAVENOUS

## 2024-09-29 MED ORDER — GLUCAGON HCL RDNA (DIAGNOSTIC) 1 MG IJ SOLR
INTRAMUSCULAR | Status: DC | PRN
  Administered 2024-09-29 (×7): .25 mg via INTRAVENOUS

## 2024-09-29 MED ORDER — PHENYLEPHRINE HCL-NACL 20-0.9 MG/250ML-% IV SOLN
INTRAVENOUS | Status: DC | PRN
  Administered 2024-09-29: 50 ug/min via INTRAVENOUS

## 2024-09-29 MED ORDER — PHENYLEPHRINE 80 MCG/ML (10ML) SYRINGE FOR IV PUSH (FOR BLOOD PRESSURE SUPPORT)
PREFILLED_SYRINGE | INTRAVENOUS | Status: DC | PRN
  Administered 2024-09-29 (×9): 80 ug via INTRAVENOUS

## 2024-09-29 MED ORDER — GLUCAGON HCL RDNA (DIAGNOSTIC) 1 MG IJ SOLR
INTRAMUSCULAR | Status: AC
  Filled 2024-09-29: qty 1

## 2024-09-29 MED ORDER — ETOMIDATE 2 MG/ML IV SOLN
INTRAVENOUS | Status: DC | PRN
  Administered 2024-09-29: 18 mg via INTRAVENOUS

## 2024-09-29 MED ORDER — HEPARIN (PORCINE) 25000 UT/250ML-% IV SOLN
2150.0000 [IU]/h | INTRAVENOUS | Status: DC
  Administered 2024-09-30: 1750 [IU]/h via INTRAVENOUS
  Administered 2024-09-30: 1600 [IU]/h via INTRAVENOUS
  Administered 2024-10-01 – 2024-10-02 (×4): 2100 [IU]/h via INTRAVENOUS
  Administered 2024-10-03: 2150 [IU]/h via INTRAVENOUS
  Filled 2024-09-29 (×8): qty 250

## 2024-09-29 MED ORDER — FENTANYL CITRATE (PF) 100 MCG/2ML IJ SOLN
INTRAMUSCULAR | Status: AC
  Filled 2024-09-29: qty 2

## 2024-09-29 MED ORDER — AMIODARONE IV BOLUS ONLY 150 MG/100ML: 150.0000 mg | Freq: Once | INTRAVENOUS | Status: DC

## 2024-09-29 MED ORDER — EPINEPHRINE 1 MG/10ML IV SOSY
PREFILLED_SYRINGE | INTRAVENOUS | Status: AC
  Filled 2024-09-29: qty 10

## 2024-09-29 MED ORDER — SODIUM CHLORIDE 0.9 % IV SOLN
INTRAVENOUS | Status: DC | PRN
  Administered 2024-09-29: 35 mL

## 2024-09-29 MED ORDER — ONDANSETRON HCL 4 MG/2ML IJ SOLN
INTRAMUSCULAR | Status: DC | PRN
  Administered 2024-09-29: 4 mg via INTRAVENOUS

## 2024-09-29 MED ORDER — LIDOCAINE 2% (20 MG/ML) 5 ML SYRINGE
INTRAMUSCULAR | Status: DC | PRN
  Administered 2024-09-29: 100 mg via INTRAVENOUS

## 2024-09-29 MED ORDER — SUGAMMADEX SODIUM 200 MG/2ML IV SOLN
INTRAVENOUS | Status: DC | PRN
  Administered 2024-09-29: 200 mg via INTRAVENOUS

## 2024-09-29 NOTE — Progress Notes (Signed)
 PROGRESS NOTE  Lance Hubbard FMW:[id number] DOB: 23-Nov-1940 DOA: 09/27/2024 PCP: Frederik Charleston, MD  HPI/Recap of past 24 hours: Lance Hubbard is a 83 y.o. male with medical history significant for hypertension, chronic atrial fibrillation, CML and osteoarthritis, who presented to the ER with acute onset of severe upper abdominal pain with associated hypotension. Pt is s/p laparoscopic cholecystectomy on 11/28, now reporting subsequent severe epigastric pain radiating to his right lateral abdomen. In the ED, BP was 118/77, HR 171, RR 26. Labs revealed lipase 146 AST 69 ALT 102, total bili of 3.9 up from 1.4, WBC 16.8. EKG showed atrial fibrillation with RVR, PVCs and low voltage QRS.  CT abdomen pelvis showed 2 stones in the distal CBD, with third gallstone within the duodenum all measuring about 6 mm, noted interval change of cholecystectomy with a 6.7 x 2.1 cm fluid collection in the gallbladder fossa, adjacent fat stranding, and fluid tracking inferiorly in the pericolic gutter. Findings may be postoperative however are concerning for developing abscess vs concern for biliary leak, HIDA scan is recommended for further evaluation. Moderate right pleural effusion and atelectasis or pneumonia in the right lower lobe, moderate to large colonic stool burden.  Cardiology, GI and general surgery consulted.  Patient admitted for further management.     This am, reports improved abd pain. HR uncontrolled, denies any chest pain, n/v.    Assessment/Plan: Principal Problem:   Atrial fibrillation with RVR (HCC) Active Problems:   Elevated LFTs   CML (chronic myelocytic leukemia) (HCC)   Chronic HFrEF (heart failure with reduced ejection fraction) (HCC)   Dyslipidemia   Hypothyroidism   Family history of CML (chronic monocytic leukemia)   Coronary artery calcification seen on CAT scan   Pure hypercholesterolemia   RUQ pain  Sepsis likely choledocholithiasis Elevated LFTs ??Intra-abdominal  abscess On presentation, noted to be tachycardic, tachypneic, hypotension with leukocytosis Currently afebrile, with leukocytosis BC x 2 NGTD Noted elevated LFTs including T. Bili CT abdomen/pelvis showed 2 stones in the distal CBD, with third gallstone within the duodenum all measuring about 6 mm, noted interval change of cholecystectomy with a 6.7 x 2.1 cm fluid collection in the gallbladder fossa, adjacent fat stranding, and fluid tracking inferiorly in the pericolic gutter. Findings may be postoperative however are concerning for developing abscess vs concern for biliary leak, HIDA scan is recommended for further evaluation GI consulted, for possible ERCP on 09/29/2024 Continue IV cefepime , Flagyl , vancomycin  D/C IV fluids for now, given CHF Pain management Monitor closely  AKI Hyponatremia- chronic Creatinine baseline around 0.9, on admission 1.39 Daily CMP  Chronic atrial fibrillation with RVR Heart rate uncontrolled Cardiology consulted, started on IV amiodarone  drip, heparin  Telemetry   Chronic systolic CHF BNP 505, no previous to compare Chest x-ray showed cardiomegaly, with small right pleural effusion Last echo done 08/2024 showed EF of 30 to 35%, global hypokinesis, 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   Weight as of this encounter: 99.8 kg.     Code Status: Full  Family Communication: Wife at bedside  Disposition Plan: Status is: Inpatient Remains inpatient appropriate because:  Level of care      Consultants: Cardiology General  Surgery GI  Procedures: None  Antimicrobials: Cefepime  Flagyl  Vancomycin   DVT prophylaxis: IV heparin    Objective: Vitals:   09/29/24 0515 09/29/24 0742 09/29/24 1303 09/29/24 1425  BP:  114/79 111/87 (!) 114/93  Pulse: 96 (!) 105 (!) 124 (!) 110  Resp:  18 18 (!) 22  Temp:  98.2 F (36.8 C) 98 F (36.7 C) 97.6 F (36.4 C)  TempSrc:  Oral Oral Temporal  SpO2: 98% 100% 97% 95%  Weight:    99.8 kg  Height:    6' 2 (1.88 m)    Intake/Output Summary (Last 24 hours) at 09/29/2024 1618 Last data filed at 09/29/2024 1509 Gross per 24 hour  Intake 4939.13 ml  Output 150 ml  Net 4789.13 ml   Filed Weights   09/27/24 2147 09/29/24 1425  Weight: 99.8 kg 99.8 kg    Exam: General: NAD Cardiovascular: S1, S2 present Respiratory: Diminished breath sounds bilaterally Abdomen: Soft, nontender, mildly distended, bowel sounds present Musculoskeletal: No bilateral pedal edema noted Skin: Normal Psychiatry: Normal mood    Data Reviewed: CBC: Recent Labs  Lab 09/23/24 0431 09/24/24 0318 09/27/24 2132 09/28/24 0434 09/29/24 0402  WBC 11.3* 11.5* 16.8* 14.8* 10.7*  NEUTROABS  --   --  14.2*  --  8.2*  HGB 11.9* 11.7* 12.4* 12.1* 10.6*  HCT 34.9* 34.2* 36.6* 35.6* 31.5*  MCV 86.6 87.2 85.7 86.0 86.8  PLT 192 182 311 305 269   Basic Metabolic Panel: Recent Labs  Lab 09/23/24 0431 09/24/24 0318 09/27/24 2132 09/28/24 0434 09/29/24 0402  NA 133* 133* 130* 131* 131*  K 3.9 3.8 3.7 4.0 3.9  CL 101 103 98 103 98  CO2 22 22 25  21* 22  GLUCOSE 103* 107* 125* 108* 100*  BUN 12 13 10 12 16   CREATININE 1.02 0.89 1.39* 1.27* 1.06  CALCIUM  8.0* 8.0* 8.3* 8.0* 7.7*  MG 2.0 2.0 1.9  --   --    GFR: Estimated Creatinine Clearance: 67.8 mL/min (by C-G formula based on SCr of 1.06 mg/dL). Liver Function Tests: Recent Labs  Lab 09/23/24 0431 09/27/24 2132 09/28/24 1219 09/29/24 0402  AST 58* 69* 63* 38  ALT 164* 102* 99* 80*  ALKPHOS 110 213* 218* 168*  BILITOT  1.4* 3.9* 2.1* 1.4*  PROT 5.9* 6.0* 6.0* 5.6*  ALBUMIN 3.2* 2.8* 2.7* 2.3*   Recent Labs  Lab 09/27/24 2132  LIPASE 146*   No results for input(s): AMMONIA in the last 168 hours. Coagulation Profile: Recent Labs  Lab 09/27/24 2132  INR 1.5*   Cardiac Enzymes: No results for input(s): CKTOTAL, CKMB, CKMBINDEX, TROPONINI in the last 168 hours. BNP (last 3 results) No results for input(s): PROBNP in the last 8760 hours. HbA1C: No results for input(s): HGBA1C in the last 72 hours. CBG: No results for input(s): GLUCAP in the last 168 hours. Lipid Profile: No results for input(s): CHOL, HDL, LDLCALC, TRIG, CHOLHDL, LDLDIRECT in the last 72 hours. Thyroid  Function Tests: Recent Labs    09/28/24 0250  TSH 3.099   Anemia Panel: No results for input(s): VITAMINB12, FOLATE, FERRITIN, TIBC, IRON, RETICCTPCT in the last 72 hours. Urine analysis:    Component Value Date/Time   COLORURINE AMBER (A) 09/27/2024 2348   APPEARANCEUR HAZY (A) 09/27/2024 2348   LABSPEC >1.046 (H) 09/27/2024 2348   PHURINE 5.0 09/27/2024 2348   GLUCOSEU NEGATIVE 09/27/2024 2348   HGBUR NEGATIVE 09/27/2024 2348   BILIRUBINUR SMALL (  A) 09/27/2024 2348   KETONESUR NEGATIVE 09/27/2024 2348   PROTEINUR 30 (A) 09/27/2024 2348   NITRITE NEGATIVE 09/27/2024 2348   LEUKOCYTESUR NEGATIVE 09/27/2024 2348   Sepsis Labs: @LABRCNTIP (procalcitonin:4,lacticidven:4)  ) Recent Results (from the past 240 hours)  Surgical pcr screen     Status: None   Collection Time: 09/22/24  8:58 AM   Specimen: Nasal Mucosa; Nasal Swab  Result Value Ref Range Status   MRSA, PCR NEGATIVE NEGATIVE Final   Staphylococcus aureus NEGATIVE NEGATIVE Final    Comment: (NOTE) The Xpert SA Assay (FDA approved for NASAL specimens in patients 49 years of age and older), is one component of a comprehensive surveillance program. It is not intended to diagnose infection nor to guide or monitor  treatment. Performed at [facility], [address], [place], [place] [address]   Blood culture (routine x 2)     Status: None (Preliminary result)   Collection Time: 09/27/24 10:09 PM   Specimen: BLOOD RIGHT ARM  Result Value Ref Range Status   Specimen Description BLOOD RIGHT ARM  Final   Special Requests   Final    BOTTLES DRAWN AEROBIC AND ANAEROBIC Blood Culture adequate volume   Culture   Final    NO GROWTH 2 DAYS Performed at [facility], [address] [place], [place] [address]    Report Status PENDING  Incomplete  Blood culture (routine x 2)     Status: None (Preliminary result)   Collection Time: 09/27/24 10:09 PM   Specimen: BLOOD RIGHT HAND  Result Value Ref Range Status   Specimen Description BLOOD RIGHT HAND  Final   Special Requests   Final    BOTTLES DRAWN AEROBIC AND ANAEROBIC Blood Culture adequate volume   Culture   Final    NO GROWTH 2 DAYS Performed at [facility], [address], [place], [place] [address]    Report Status PENDING  Incomplete  Resp panel by RT-PCR (RSV, Flu A&B, Covid) Anterior Nasal Swab     Status: None   Collection Time: 09/27/24 10:21 PM   Specimen: Anterior Nasal Swab  Result Value Ref Range Status   SARS Coronavirus 2 by RT PCR NEGATIVE NEGATIVE Final   Influenza A by PCR NEGATIVE NEGATIVE Final   Influenza B by PCR NEGATIVE NEGATIVE Final    Comment: (NOTE) The Xpert Xpress SARS-CoV-2/FLU/RSV plus assay is intended as an aid in the diagnosis of influenza from Nasopharyngeal swab specimens and should not be used as a sole basis for treatment. Nasal washings and aspirates are unacceptable for Xpert Xpress SARS-CoV-2/FLU/RSV testing.  Fact Sheet for Patients: bloggercourse.com  Fact Sheet for Healthcare Providers: seriousbroker.it  This test is not yet approved or cleared by the [place]  FDA and has been authorized for detection and/or diagnosis of  SARS-CoV-2 by FDA under an Emergency Use Authorization (EUA). This EUA will remain in effect (meaning this test can be used) for the duration of the COVID-19 declaration under Section 564(b)(1) of the Act, 21 U.S.C. section 360bbb-3(b)(1), unless the authorization is terminated or revoked.     Resp Syncytial Virus by PCR NEGATIVE NEGATIVE Final    Comment: (NOTE) Fact Sheet for Patients: bloggercourse.com  Fact Sheet for Healthcare Providers: seriousbroker.it  This test is not yet approved or cleared by the [place]  FDA and has been authorized for detection and/or diagnosis of SARS-CoV-2 by FDA under an Emergency Use Authorization (EUA). This EUA will remain in effect (meaning this test  can be used) for the duration of the COVID-19 declaration under Section 564(b)(1) of the Act, 21 U.S.C. section 360bbb-3(b)(1), unless the authorization is terminated or revoked.  Performed at [facility], [address] [place], [place] [address]       Studies: DG CHEST PORT 1 VIEW Result Date: 09/28/2024 CLINICAL DATA:  AFib. EXAM: PORTABLE CHEST 1 VIEW COMPARISON:  CT abdomen/pelvis dated 09/27/2024. Chest radiograph 09/22/2019. FINDINGS: Low lung volumes. Cardiomegaly. Redemonstrated small layering right pleural effusion with fluid in the minor fissure. Left lung appears clear. No pneumothorax. No acute osseous abnormality. IMPRESSION: 1. Redemonstrated small layering right pleural effusion. 2. Cardiomegaly. Electronically Signed   By: Harrietta Sherry M.D.   On: 09/28/2024 11:21   CT ABDOMEN PELVIS W CONTRAST Addendum Date: 09/27/2024 ADDENDUM: Upon further review and discussion with Dr. Teresa, there are two gallstones both measuring 6 mm in the distal common bile duct at the ampulla of Vater ( series 6 image 66). Likely a 3rd gallstone within the duodenum measuring 6 mm ( 6 / 68 ). Mild intrahepatic biliary dilation. Electronically  signed by: Norman Gatlin MD 09/27/2024 11:45 PM EST RP Workstation: [id number]   Result Date: 09/27/2024 ORIGINAL REPORT EXAM: CT ABDOMEN AND PELVIS WITH CONTRAST 09/27/2024 10:59:59 PM TECHNIQUE: CT of the abdomen and pelvis was performed with the administration of intravenous contrast. 75 mL iohexol  (OMNIPAQUE ) 350 MG/ML injection was administered. Multiplanar reformatted images are provided for review. Automated exposure control, iterative reconstruction, and/or weight-based adjustment of the mA/kV was utilized to reduce the radiation dose to as low as reasonably achievable. COMPARISON: None available. CLINICAL HISTORY: Abdominal pain, post-op. FINDINGS: LOWER CHEST: Round consolidation in the right lower lobe. Associated moderate right pleural effusion. LIVER: The liver is unremarkable. GALLBLADDER AND BILE DUCTS: Interval change of cholecystectomy. There is a 6.7 x 2.1 cm fluid collection in the gallbladder fossa. Adjacent fat stranding and fluid tracking inferiorly in the pericolic gutter. SPLEEN: No acute abnormality. PANCREAS: No acute abnormality. ADRENAL GLANDS: No acute abnormality. KIDNEYS, URETERS AND BLADDER: No stones in the kidneys or ureters. No hydronephrosis. No perinephric or periureteral stranding. Urinary bladder is unremarkable. GI AND BOWEL: Stomach demonstrates no acute abnormality. There is no bowel obstruction. Moderate to large stool burden in the right colon. Wall thickening and hyper-enhancement of the 2nd and 3rd portions of the duodenum. PERITONEUM AND RETROPERITONEUM: No ascites. No free air. Fluid tracking inferiorly in the pericolic gutter. VASCULATURE: Aorta is normal in caliber. LYMPH NODES: No lymphadenopathy. REPRODUCTIVE ORGANS: Enlarged prostate. BONES AND SOFT TISSUES: No acute osseous abnormality. Small subcutaneous nodules and loculated gas in the anterior subcutaneous fat likely due to injections. Moderate right and small left fat-containing inguinal hernias.  IMPRESSION: 1. Interval change of cholecystectomy with a 6.7 x 2.1 cm fluid collection in the gallbladder fossa, adjacent fat stranding, and fluid tracking inferiorly in the pericolic gutter. Findings may be postoperative however are concerning for developing abscess. If there is concern for biliary leak, HIDA scan is recommended for further evaluation. 2. Reactive duodenitis. 3. Moderate right pleural effusion and atelectasis or pneumonia in the right lower lobe. 4. Moderate to large colonic stool burden. 5. Enlarged prostate. Electronically signed by: Norman Gatlin MD 09/27/2024 11:18 PM EST RP Workstation: [id number]    Scheduled Meds:  indomethacin   100 mg Rectal Once   [MAR Hold] levothyroxine   25 mcg Oral QAC breakfast   [MAR Hold] polyethylene glycol  17 g Oral BID   [MAR Hold] rosuvastatin   5 mg Oral  QODAY   [MAR Hold] senna-docusate  1 tablet Oral BID    Continuous Infusions:  amiodarone  60 mg/hr (09/29/24 1512)   [MAR Hold] ceFEPime  (MAXIPIME ) IV Stopped (09/29/24 1258)   lactated ringers  40 mL/hr at 09/29/24 1509   [MAR Hold] metronidazole  Stopped (09/29/24 0839)   [MAR Hold] vancomycin  Stopped (09/29/24 1131)     LOS: 1 day     Lebron JINNY Cage, MD [facility]  If 7PM-7AM, please contact night-coverage [email or web address] 09/29/2024, 4:18 PM

## 2024-09-29 NOTE — Anesthesia Preprocedure Evaluation (Addendum)
 Anesthesia Evaluation  Patient identified by MRN, date of birth, ID band Patient awake    Reviewed: Allergy & Precautions, NPO status , Patient's Chart, lab work & pertinent test results  Airway Mallampati: II  TM Distance: >3 FB Neck ROM: Full    Dental  (+) Teeth Intact, Dental Advisory Given   Pulmonary former smoker   Pulmonary exam normal breath sounds clear to auscultation       Cardiovascular hypertension, Pt. on medications + CAD  + dysrhythmias (Amiodarone  gtt) Atrial Fibrillation  Rhythm:Irregular Rate:Abnormal  2D echo 09/14/2024 showed EF 30 to 35% with global HK, moderate to severe pulmonary hypertension   Neuro/Psych negative neurological ROS  negative psych ROS   GI/Hepatic negative GI ROS,,,Choledocholithiasis, gallbladder fossa fluid collection Biliary pancreatitis   POD 7, s/p laparoscopic cholecystectomy    Endo/Other  Hypothyroidism    Renal/GU negative Renal ROS     Musculoskeletal  (+) Arthritis ,    Abdominal   Peds  Hematology  (+) Blood dyscrasia (Eliquis ), anemia CML   Anesthesia Other Findings Day of surgery medications reviewed with the patient.  Reproductive/Obstetrics                              Anesthesia Physical Anesthesia Plan  ASA: 4  Anesthesia Plan: General   Post-op Pain Management:    Induction: Intravenous  PONV Risk Score and Plan: 2 and Dexamethasone  and Ondansetron   Airway Management Planned: Oral ETT  Additional Equipment:   Intra-op Plan:   Post-operative Plan: Extubation in OR  Informed Consent: I have reviewed the patients History and Physical, chart, labs and discussed the procedure including the risks, benefits and alternatives for the proposed anesthesia with the patient or authorized representative who has indicated his/her understanding and acceptance.     Dental advisory given  Plan Discussed with:  CRNA  Anesthesia Plan Comments:         Anesthesia Quick Evaluation

## 2024-09-29 NOTE — Progress Notes (Signed)
 PHARMACY - ANTICOAGULATION CONSULT NOTE  Pharmacy Consult for heparin  Indication: atrial fibrillation  Patient Measurements: Height: 6' 2 (188 cm) Weight: 99.8 kg (220 lb 0.3 oz) IBW/kg (Calculated) : 82.2 HEPARIN  DW (KG): 99.8  Vital Signs: Temp: 97.6 F (36.4 C) (12/05 1425) Temp Source: Temporal (12/05 1425) BP: 123/95 (12/05 1730) Pulse Rate: 129 (12/05 1735)  Labs: Recent Labs    09/27/24 2132 09/28/24 0434 09/28/24 1219 09/29/24 0402  HGB 12.4* 12.1*  --  10.6*  HCT 36.6* 35.6*  --  31.5*  PLT 311 305  --  269  APTT  --   --  50*  --   LABPROT 19.0*  --   --   --   INR 1.5*  --   --   --   HEPARINUNFRC  --   --  >1.10*  --   CREATININE 1.39* 1.27*  --  1.06    Estimated Creatinine Clearance: 67.8 mL/min (by C-G formula based on SCr of 1.06 mg/dL).   Medical History: Past Medical History:  Diagnosis Date   Arthritis    CML (chronic myelocytic leukemia) (HCC)    in remission   History of kidney stones    Hypertension    Persistent atrial fibrillation [facility])    Atrial Fibrillation   Assessment: 75 yoM on apixaban  PTA for afib, last dose 12/3 AM. Apixaban  held for ERCP.  Pharmacy consulted for heparin .    Per GI, ok to restart heparin  at 3:00 no bolus.   Goal of Therapy:  Heparin  level 0.3-0.7 units/ml aPTT 66-102 seconds Monitor platelets by anticoagulation protocol: Yes   Plan:   Restart heparin  1600 units/hr at 03:00 F/u 8hr aptt/heparin  level F/u aPTT until correlates with heparin  level  Monitor daily aPTT, heparin  level, CBC, signs/symptoms of bleeding  Follow up restart apixaban    Jinnie Door, PharmD, BCPS, BCCP Clinical Pharmacist  Please check AMION for all [facility] Pharmacy phone numbers After 10:00 PM, call Main Pharmacy [phone]

## 2024-09-29 NOTE — Interval H&P Note (Signed)
 History and Physical Interval Note:  09/29/2024 3:15 PM  [name]  has presented today for surgery, with the diagnosis of Choledocholithiasis, gallbladder fossa fluid collection.  The various methods of treatment have been discussed with the patient and family. After consideration of risks, benefits and other options for treatment, the patient has consented to  Procedure(s): ERCP, WITH INTERVENTION IF INDICATED (N/A) as a surgical intervention.  The patient's history has been reviewed, patient examined, no change in status, stable for surgery.  I have reviewed the patient's chart and labs.  Questions were answered to the patient's satisfaction.     The risks of an ERCP were discussed at length, including but not limited to the risk of perforation, bleeding, abdominal pain, post-ERCP pancreatitis (while usually mild can be severe and even life threatening).    [name]

## 2024-09-29 NOTE — Plan of Care (Signed)

## 2024-09-29 NOTE — Progress Notes (Signed)
 Progress Note  Patient Name: Lance Hubbard Date of Encounter: 09/29/2024 [facility] Cardiologist: None   Interval Summary    Had A-fib with RVR last night up into the 140s when he went to get up to go to the bathroom and IV Amio increased to 60 mg/h  HR in the 120's currently.  No belly pain at present.    Vital Signs Vitals:   09/28/24 2339 09/29/24 0039 09/29/24 0456 09/29/24 0515  BP: 99/71  98/70   Pulse: (!) 132 96 (!) 110 96  Resp: 18  20   Temp: 98.3 F (36.8 C)  98.3 F (36.8 C)   TempSrc: Oral  Oral   SpO2: 97% 97% 98% 98%  Weight:      Height:        Intake/Output Summary (Last 24 hours) at 09/29/2024 0740 Last data filed at 09/29/2024 0645 Gross per 24 hour  Intake 0 ml  Output 150 ml  Net -150 ml      09/27/2024    9:47 PM 09/22/2024   10:53 AM 09/20/2024    7:33 PM  Last 3 Weights  Weight (lbs) 220 lb 220 lb 220 lb  Weight (kg) 99.791 kg 99.791 kg 99.791 kg      Telemetry/ECG  Atrial fibrillation with RVR in the 120's- Personally Reviewed  Physical Exam  GEN: Well nourished, well developed in no acute distress HEENT: Normal NECK: No JVD; No carotid bruits LYMPHATICS: No lymphadenopathy CARDIAC:irregularly irregular and tachy, no murmurs, rubs, gallops RESPIRATORY:  Clear to auscultation without rales, wheezing or rhonchi  ABDOMEN: Soft, non-tender, non-distended MUSCULOSKELETAL:  No edema; No deformity  SKIN: Warm and dry NEUROLOGIC:  Alert and oriented x 3 PSYCHIATRIC:  Normal affect  Assessment & Plan  [name] is a 83 y.o. 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  Atrial fibrillation with RVR - Status post remote A-fib ablation 06/14/2021 and cardioversion 01/13/2024  - he has not had any recurrence of A-fib until recently when he was hospitalized with gallstone pancreatitis - EKG prior to discharge in  November showed sinus rhythm with PACs - He said he felt onset of palpitations  and knew he was back in A-fib - Suspect that recent acute illness (A-fib last month in the setting of gallstone pancreatitis and now likely sepsis from intra-abdominal source with CT also concerning for right lower lobe pneumonia and abscess after recent cholecystectomy) has triggered his A-fib with RVR - IV Amio drip increased to 60 mg/h last night due to heart rates in the 140s - Heart rate currently in the low 100s to 120's with soft BP at 98/70 mmHg - will bolus with 150mg  IV Amio this am over 20 minutes due to soft BP - Continue IV Amio for now until acute illness has improved heart rate should improve with treatment of sepsis - He was on Eliquis  at home for CHA2DS2-VASc score of 3 which has been held for procedures - Continue IV heparin  drip - Will continue to bolus with Amio 150 mg IV as needed for elevated heart rate   Chronic HFrEF Hypertension - 2D echo 09/14/2024 showed EF 30 to 35% with global HK, moderate to severe pulmonary hypertension - Currently home dose of Entresto  was on hold due to acute illness and soft blood pressure - Will not restart GDMT for now as we need adequate  blood pressure to titrate Amio for heart rate control - Once he is over his acute illness we can get him on adequate GDMT as BP tolerates   Abdominal abscess Recent pancreatitis status post cholecystectomy Sepsis - Status post laparoscopic cholecystectomy 09/22/2024 for gallstone pancreatitis - Now back with increased abdominal pain and leukocytosis - CT with possible abscess near cholecystectomy site - Continue IV antibiotics per TRH and surgery - Possible plan for ERCP and drainage of abscess   Coronary artery calcifications Hyperlipidemia -Noted to have coronary artery calcifications on CT 05/14/2021 for A-fib ablation.  Coronary calcium  score was 419 - Denies any anginal chest pain - Continue Crestor  5 mg daily - No  ASA due to DOAC use for A-fib     Otherwise management per primary.   She I have been rounder to be this week so I will do rounder be this weekend For questions or updates, please contact [facility] Please consult [email or web address] for contact info under         Signed, Wilbert Bihari, MD

## 2024-09-29 NOTE — Progress Notes (Signed)
 Progress Note     Subjective: Pain has resolved overnight.  Wife at bedside.  Urine still very dark.  No N/V, some diarrhea  Objective: Vital signs in last 24 hours: Temp:  [98.2 F (36.8 C)-99.3 F (37.4 C)] 98.2 F (36.8 C) (12/05 0742) Pulse Rate:  [96-151] 105 (12/05 0742) Resp:  [18-24] 18 (12/05 0742) BP: (93-114)/(58-81) 114/79 (12/05 0742) SpO2:  [97 %-100 %] 100 % (12/05 0742) Last BM Date : 09/28/24  Intake/Output from previous day: 12/04 0701 - 12/05 0700 In: 0  Out: 150 [Urine:150] Intake/Output this shift: No intake/output data recorded.  PE: General: pleasant, WD, overweight male who is laying in bed in NAD HEENT: sclera anicteric Lungs: Respiratory effort nonlabored Abd: soft, NT, incisions C/D/I, obese Psych: A&Ox3 with an appropriate affect.    Lab Results:  Recent Labs    09/28/24 0434 09/29/24 0402  WBC 14.8* 10.7*  HGB 12.1* 10.6*  HCT 35.6* 31.5*  PLT 305 269   BMET Recent Labs    09/28/24 0434 09/29/24 0402  NA 131* 131*  K 4.0 3.9  CL 103 98  CO2 21* 22  GLUCOSE 108* 100*  BUN 12 16  CREATININE 1.27* 1.06  CALCIUM  8.0* 7.7*   PT/INR Recent Labs    09/27/24 2132  LABPROT 19.0*  INR 1.5*   CMP     Component Value Date/Time   NA 131 (L) 09/29/2024 0402   NA 137 05/06/2021 1108   K 3.9 09/29/2024 0402   CL 98 09/29/2024 0402   CO2 22 09/29/2024 0402   GLUCOSE 100 (H) 09/29/2024 0402   BUN 16 09/29/2024 0402   BUN 16 05/06/2021 1108   CREATININE 1.06 09/29/2024 0402   CREATININE 1.04 09/19/2019 1030   CALCIUM  7.7 (L) 09/29/2024 0402   PROT 5.6 (L) 09/29/2024 0402   ALBUMIN 2.3 (L) 09/29/2024 0402   AST 38 09/29/2024 0402   AST 28 09/19/2019 1030   ALT 80 (H) 09/29/2024 0402   ALT 34 09/19/2019 1030   ALKPHOS 168 (H) 09/29/2024 0402   BILITOT 1.4 (H) 09/29/2024 0402   BILITOT 0.7 09/19/2019 1030   GFRNONAA >60 09/29/2024 0402   GFRNONAA >60 09/19/2019 1030   GFRAA >60 09/19/2019 1030   Lipase      Component Value Date/Time   LIPASE 146 (H) 09/27/2024 2132       Studies/Results: CT ABDOMEN PELVIS W CONTRAST Addendum Date: 09/27/2024 ADDENDUM #1  ADDENDUM: ---------------------------------------------------- Upon further review and discussion with Dr. Teresa, there are two gallstones both measuring 6 mm in the distal common bile duct at the ampulla of Vater ( series 6 image 66). Likely a 3rd gallstone within the duodenum measuring 6 mm ( 6 / 68 ). Mild intrahepatic biliary dilation. Electronically signed by: Norman Gatlin MD 09/27/2024 11:45 PM EST RP Workstation: [id number]   Result Date: 09/27/2024  ORIGINAL REPORT  EXAM: CT ABDOMEN AND PELVIS WITH CONTRAST 09/27/2024 10:59:59 PM TECHNIQUE: CT of the abdomen and pelvis was performed with the administration of intravenous contrast. 75 mL iohexol  (OMNIPAQUE ) 350 MG/ML injection was administered. Multiplanar reformatted images are provided for review. Automated exposure control, iterative reconstruction, and/or weight-based adjustment of the mA/kV was utilized to reduce the radiation dose to as low as reasonably achievable. COMPARISON: None available. CLINICAL HISTORY: Abdominal pain, post-op. FINDINGS: LOWER CHEST: Round consolidation in the right lower lobe. Associated moderate right pleural effusion. LIVER: The liver is unremarkable. GALLBLADDER AND BILE DUCTS: Interval change of cholecystectomy. There is a 6.7  x 2.1 cm fluid collection in the gallbladder fossa. Adjacent fat stranding and fluid tracking inferiorly in the pericolic gutter. SPLEEN: No acute abnormality. PANCREAS: No acute abnormality. ADRENAL GLANDS: No acute abnormality. KIDNEYS, URETERS AND BLADDER: No stones in the kidneys or ureters. No hydronephrosis. No perinephric or periureteral stranding. Urinary bladder is unremarkable. GI AND BOWEL: Stomach demonstrates no acute abnormality. There is no bowel obstruction. Moderate to large stool burden in the right colon. Wall thickening  and hyper-enhancement of the 2nd and 3rd portions of the duodenum. PERITONEUM AND RETROPERITONEUM: No ascites. No free air. Fluid tracking inferiorly in the pericolic gutter. VASCULATURE: Aorta is normal in caliber. LYMPH NODES: No lymphadenopathy. REPRODUCTIVE ORGANS: Enlarged prostate. BONES AND SOFT TISSUES: No acute osseous abnormality. Small subcutaneous nodules and loculated gas in the anterior subcutaneous fat likely due to injections. Moderate right and small left fat-containing inguinal hernias. IMPRESSION: 1. Interval change of cholecystectomy with a 6.7 x 2.1 cm fluid collection in the gallbladder fossa, adjacent fat stranding, and fluid tracking inferiorly in the pericolic gutter. Findings may be postoperative however are concerning for developing abscess. If there is concern for biliary leak, HIDA scan is recommended for further evaluation. 2. Reactive duodenitis. 3. Moderate right pleural effusion and atelectasis or pneumonia in the right lower lobe. 4. Moderate to large colonic stool burden. 5. Enlarged prostate. Electronically signed by: Norman Gatlin MD 09/27/2024 11:18 PM EST RP Workstation: [id number]    Anti-infectives: Anti-infectives (From admission, onward)    Start     Dose/Rate Route Frequency Ordered Stop   09/28/24 1200  ceFEPIme  (MAXIPIME ) 2 g in sodium chloride  0.9 % 100 mL IVPB        2 g 200 mL/hr over 30 Minutes Intravenous Every 12 hours 09/28/24 0810     09/28/24 0815  metroNIDAZOLE  (FLAGYL ) IVPB 500 mg        500 mg 100 mL/hr over 60 Minutes Intravenous Every 12 hours 09/28/24 0803     09/28/24 0815  vancomycin  (VANCOREADY) IVPB 1500 mg/300 mL        1,500 mg 150 mL/hr over 120 Minutes Intravenous Every 24 hours 09/28/24 0810     09/28/24 0015  ceFEPIme  (MAXIPIME ) 2 g in sodium chloride  0.9 % 100 mL IVPB        2 g 200 mL/hr over 30 Minutes Intravenous  Once 09/28/24 0005 09/28/24 0148   09/28/24 0015  metroNIDAZOLE  (FLAGYL ) IVPB 500 mg        500 mg 100 mL/hr  over 60 Minutes Intravenous  Once 09/28/24 0005 09/28/24 0249        Assessment/Plan Biliary pancreatitis   POD 7, s/p laparoscopic cholecystectomy  Choledocholithiasis - CT 12/3 with 6.7x2.1 cm collection in GB fossa may be post-op but adjacent fat stranding and fluid tracking inferiorly, possible abscess vs biloma, reactive duodenitis, choledocholithiasis, moderate to large stool burden - WBC 10 today, AF - LFTs downtrending today - GI for ERCP today.  Will follow up on results to assure IOC with no evidence of bile leak.  If not, this fluid in the fossa is more likely hematoma vs irrigant/normal post op finding.  Do not suspect infection/abscess - ERCP should be helpful in clarifying if bile leak present as well - if suggestive of this would consider IR drainage - surgical team will follow  -abx per primary teams  FEN: NPO for ERCP, IVF per TRH VTE: SCDs, hep gtt ID: cefepime /vanc/flagyl   - per [facility] -  A. Fib with RVR - cardiology  follwing HTN CML Osteoarthritis    LOS: 1 day    Burnard FORBES Banter, [facility] 09/29/2024, 8:52 AM Please see Amion for pager number during day hours 7:00am-4:30pm

## 2024-09-29 NOTE — Transfer of Care (Signed)
 Immediate Anesthesia Transfer of Care Note  Patient: Lance Hubbard  Procedure(s) Performed: ERCP, WITH INTERVENTION IF INDICATED  Patient Location: PACU  Anesthesia Type:General  Level of Consciousness: awake, alert , oriented, and patient cooperative  Airway & Oxygen Therapy: Patient Spontanous Breathing and Patient connected to face mask oxygen  Post-op Assessment: Report given to RN, Post -op Vital signs reviewed and stable, Patient moving all extremities, and Patient moving all extremities X 4  Post vital signs: Reviewed and stable  Last Vitals:  Vitals Value Taken Time  BP 123/95 09/29/24 17:30  Temp    Pulse 110 09/29/24 17:36  Resp 14 09/29/24 17:35  SpO2 91 % 09/29/24 17:36  Vitals shown include unfiled device data.  Last Pain:  Vitals:   09/29/24 1425  TempSrc: Temporal  PainSc: 0-No pain         Complications: No notable events documented.

## 2024-09-29 NOTE — Op Note (Addendum)
 [facility] Patient Name: Lance Hubbard Procedure Date : 09/29/2024 MRN: [id number] Attending MD: Aloha Finner , MD, [id number] Date of Birth: 12-27-1940 CSN: [id number] Age: 83 Admit Type: Inpatient Procedure:                ERCP Indications:              Bile duct stone(s), Biliary dilation on Computed                            Tomogram Scan, Evaluation and possible treatment of                            bile duct stone(s) Providers:                Aloha Finner, MD, Jacquelyn Jaci Pierce,                            RN, Felice Sar, Technician Referring MD:              Medicines:                General Anesthesia, Diclofenac  100 mg rectal,                            Glucagon  1.75 mg IV, Patient already on antibiotics Complications:            No immediate complications. Estimated Blood Loss:     Estimated blood loss was minimal. Procedure:                Pre-Anesthesia Assessment:                           - Prior to the procedure, a History and Physical                            was performed, and patient medications and                            allergies were reviewed. The patient's tolerance of                            previous anesthesia was also reviewed. The risks                            and benefits of the procedure and the sedation                            options and risks were discussed with the patient.                            All questions were answered, and informed consent                            was obtained. Prior Anticoagulants: The patient has  taken heparin , last dose was day of procedure. ASA                            Grade Assessment: III - A patient with severe                            systemic disease. After reviewing the risks and                            benefits, the patient was deemed in satisfactory                            condition to undergo the procedure.                            After obtaining informed consent, the scope was                            passed under direct vision. Throughout the                            procedure, the patient's blood pressure, pulse, and                            oxygen saturations were monitored continuously. The                            [employer] D single use                            duodenoscope was introduced through the mouth, and                            used to inject contrast into and used to inject                            contrast into the bile duct and ventral pancreatic                            duct. The ERCP was determined to be ASGE Difficulty                            Grade 3 due to challenging cannulation. Successful                            completion of the procedure was aided by performing                            the maneuvers documented (below) in this report.                            The patient tolerated the procedure. Scope In: Scope Out: Findings:      A scout  film of the abdomen was obtained. Surgical clips, consistent       with a previous cholecystectomy, were seen in the area of the right       upper quadrant of the abdomen.      The esophagus was successfully intubated under direct vision without       detailed examination of the pharynx, larynx, and associated structures,       and upper GI tract. The major papilla was congested with large       intraduodenal portion. Repeated attempts at biliary cannulation were not       successful while using a wire-guided approach. This eventually led to       placement of the wire within the pancreatic duct. Decision was made to       pursue a double-wire approach. The 0.035 inch hydrajagwire was left       within the pancreatic duct.      The bile duct could not be cannulated with the short-nosed traction       sphincterotome. Superficial cannulation of and contrast injection into       the ventral pancreatic duct  alone was accomplished with the short-nosed       traction sphincterotome. Contrast injection led to the ventral       pancreatic duct being delineated but not the bile duct.      One 4 Fr by 7 cm temporary plastic pancreatic stent with a single       external pigtail was placed into the ventral pancreatic duct. The stent       was in good position.      Using a wire guided technique the bile duct could not be cannulated with       the short-nosed traction sphincterotome or angled sphincterotome/wires.      Decision made to attempt biliary pre-cut fistulotomy. This was performed       and measured 15 mm in length was made with a monofilament needle knife       using a freehand technique using ERBE electrocautery. There was no       post-sphincterotomy bleeding. Bile was seen to flow.      A 0.035 inch x 260 cm straight Hydra Jagwire was passed into the biliary       tree. The Hydratome sphincterotome was passed over the guidewire and the       bile duct was then deeply cannulated. Contrast was injected. I       personally interpreted the bile duct images. Ductal flow of contrast was       adequate. Image quality was adequate. Contrast extended to the hepatic       ducts. Opacification of the entire biliary tree except for the       gallbladder was successful. The lower third of the main bile duct       contained filling defect thought to be a stone. Extravasation of       contrast originating from the cystic duct was observed. To discover       objects, the biliary tree was swept with a retrieval balloon. Sludge was       swept from the duct. Two stones were removed. No stones remained. One 10       Fr by 5 cm plastic biliary stent with a single external flap and a       single internal flap was placed into the common bile duct. The stent was  in good position.      A pancreatogram was not performed.      The duodenoscope was withdrawn from the patient. Impression:               -  The major papilla appeared congested.                           - Difficult cannulation as documented above.                           - A filling defect consistent with a stone was seen                            on the cholangiogram.                           - Choledocholithiasis was found. Complete removal                            was accomplished by biliary sphincterotomy.                           - A bile leak was found.                           - One temporary plastic pancreatic stent was placed                            into the ventral pancreatic duct.                           - The biliary tree was swept.                           - One plastic biliary stent was placed into the                            common bile duct. Recommendation:           - The patient will be observed post-procedure,                            until all discharge criteria are met.                           - Return patient to hospital ward for ongoing care.                           - Observe patient's clinical course.                           - Check liver enzymes (AST, ALT, alkaline                            phosphatase, bilirubin) in the morning.                           -  Repeat ERCP to remove stent in 67-months to allow                            biliary leak healing.                           - Watch for pancreatitis, bleeding, perforation,                            and cholangitis.                           - May restart Heparin  @ 300AM without bolus and                            monitor.                           - ADAT.                           - PD stent followup in 2-weeks with KUB to be                            arranged.                           - The findings and recommendations were discussed                            with the patient.                           - The findings and recommendations were discussed                            with the referring  physician. Procedure Code(s):        --- Professional ---                           [phone], Endoscopic retrograde                            cholangiopancreatography (ERCP); with placement of                            endoscopic stent into biliary or pancreatic duct,                            including pre- and post-dilation and guide wire                            passage, when performed, including sphincterotomy,                            when performed, each stent  [id number], 59, Endoscopic retrograde                            cholangiopancreatography (ERCP); with placement of                            endoscopic stent into biliary or pancreatic duct,                            including pre- and post-dilation and guide wire                            passage, when performed, including sphincterotomy,                            when performed, each stent                           [id number], Endoscopic retrograde                            cholangiopancreatography (ERCP); with removal of                            calculi/debris from biliary/pancreatic duct(s)                           74328, 26, Endoscopic catheterization of the                            biliary ductal system, radiological supervision and                            interpretation Diagnosis Code(s):        --- Professional ---                           K83.8, Other specified diseases of biliary tract                           K83.9, Disease of biliary tract, unspecified                           K80.50, Calculus of bile duct without cholangitis                            or cholecystitis without obstruction                           R93.2, Abnormal findings on diagnostic imaging of                            liver and biliary tract CPT copyright 2022 [employer]. All rights reserved. The codes documented in this report are preliminary and upon coder review may  be revised to meet  current compliance requirements. Aloha Finner, MD 09/29/2024 5:25:24 PM Number of Addenda: 0

## 2024-09-30 ENCOUNTER — Inpatient Hospital Stay (HOSPITAL_COMMUNITY)

## 2024-09-30 DIAGNOSIS — I272 Pulmonary hypertension, unspecified: Secondary | ICD-10-CM

## 2024-09-30 DIAGNOSIS — I4891 Unspecified atrial fibrillation: Secondary | ICD-10-CM | POA: Diagnosis not present

## 2024-09-30 DIAGNOSIS — K839 Disease of biliary tract, unspecified: Secondary | ICD-10-CM

## 2024-09-30 DIAGNOSIS — I420 Dilated cardiomyopathy: Secondary | ICD-10-CM

## 2024-09-30 DIAGNOSIS — K668 Other specified disorders of peritoneum: Secondary | ICD-10-CM

## 2024-09-30 DIAGNOSIS — K91840 Postprocedural hemorrhage and hematoma of a digestive system organ or structure following a digestive system procedure: Secondary | ICD-10-CM | POA: Insufficient documentation

## 2024-09-30 LAB — COMPREHENSIVE METABOLIC PANEL WITH GFR
ALT: 68 U/L — ABNORMAL HIGH (ref 0–44)
AST: 25 U/L (ref 15–41)
Albumin: 2.5 g/dL — ABNORMAL LOW (ref 3.5–5.0)
Alkaline Phosphatase: 146 U/L — ABNORMAL HIGH (ref 38–126)
Anion gap: 8 (ref 5–15)
BUN: 20 mg/dL (ref 8–23)
CO2: 21 mmol/L — ABNORMAL LOW (ref 22–32)
Calcium: 7.7 mg/dL — ABNORMAL LOW (ref 8.9–10.3)
Chloride: 98 mmol/L (ref 98–111)
Creatinine, Ser: 1.03 mg/dL (ref 0.61–1.24)
GFR, Estimated: >60 mL/min (ref >60)
Glucose, Bld: 157 mg/dL — ABNORMAL HIGH (ref 70–99)
Potassium: 4 mmol/L (ref 3.5–5.1)
Sodium: 127 mmol/L — ABNORMAL LOW (ref 135–145)
Total Bilirubin: 1.2 mg/dL (ref 0.0–1.2)
Total Protein: 5.9 g/dL — ABNORMAL LOW (ref 6.5–8.1)

## 2024-09-30 LAB — CBC WITH DIFFERENTIAL/PLATELET
Abs Immature Granulocytes: 0.05 K/uL (ref 0.00–0.07)
Basophils Absolute: 0 K/uL (ref 0.0–0.1)
Basophils Relative: 0 %
Eosinophils Absolute: 0 K/uL (ref 0.0–0.5)
Eosinophils Relative: 0 %
HCT: 31.4 % — ABNORMAL LOW (ref 39.0–52.0)
Hemoglobin: 10.8 g/dL — ABNORMAL LOW (ref 13.0–17.0)
Immature Granulocytes: 1 %
Lymphocytes Relative: 4 %
Lymphs Abs: 0.4 K/uL — ABNORMAL LOW (ref 0.7–4.0)
MCH: 29.2 pg (ref 26.0–34.0)
MCHC: 34.4 g/dL (ref 30.0–36.0)
MCV: 84.9 fL (ref 80.0–100.0)
Monocytes Absolute: 0.3 K/uL (ref 0.1–1.0)
Monocytes Relative: 3 %
Neutro Abs: 9 K/uL — ABNORMAL HIGH (ref 1.7–7.7)
Neutrophils Relative %: 92 %
Platelets: 310 K/uL (ref 150–400)
RBC: 3.7 MIL/uL — ABNORMAL LOW (ref 4.22–5.81)
RDW: 14.5 % (ref 11.5–15.5)
WBC: 9.7 K/uL (ref 4.0–10.5)
nRBC: 0 % (ref 0.0–0.2)

## 2024-09-30 LAB — APTT
aPTT: 56 s — ABNORMAL HIGH (ref 24–36)
aPTT: 54 s — ABNORMAL HIGH (ref 24–36)

## 2024-09-30 LAB — URINALYSIS, ROUTINE W REFLEX MICROSCOPIC
Bilirubin Urine: NEGATIVE
Glucose, UA: NEGATIVE mg/dL
Hgb urine dipstick: NEGATIVE
Ketones, ur: 5 mg/dL — AB
Leukocytes,Ua: NEGATIVE
Nitrite: NEGATIVE
Protein, ur: NEGATIVE mg/dL
Specific Gravity, Urine: 1.024 (ref 1.005–1.030)
pH: 5 (ref 5.0–8.0)

## 2024-09-30 LAB — HEPARIN LEVEL (UNFRACTIONATED): Heparin Unfractionated: 0.76 [IU]/mL — ABNORMAL HIGH (ref 0.30–0.70)

## 2024-09-30 MED ORDER — METOPROLOL SUCCINATE ER 25 MG PO TB24
25.0000 mg | ORAL_TABLET | Freq: Every day | ORAL | Status: DC
  Administered 2024-09-30: 25 mg via ORAL
  Filled 2024-09-30: qty 1

## 2024-09-30 MED ORDER — FUROSEMIDE 10 MG/ML IJ SOLN
40.0000 mg | Freq: Two times a day (BID) | INTRAMUSCULAR | Status: DC
  Administered 2024-09-30 (×2): 40 mg via INTRAVENOUS
  Filled 2024-09-30 (×2): qty 4

## 2024-09-30 MED ORDER — AMIODARONE IV BOLUS ONLY 150 MG/100ML: 150.0000 mg | Freq: Once | INTRAVENOUS | Status: DC

## 2024-09-30 MED ORDER — AMIODARONE LOAD VIA INFUSION
150.0000 mg | Freq: Once | INTRAVENOUS | Status: AC
  Administered 2024-09-30: 150 mg via INTRAVENOUS
  Filled 2024-09-30: qty 83.34

## 2024-09-30 NOTE — Progress Notes (Addendum)
 [facility] ROUNDING NOTE   Subjective: Abdominal pain is improving.  Complains of tightness in chest and shortness of breath   Objective: Vital signs in last 24 hours: Temp:  [97.3 F (36.3 C)-98.3 F (36.8 C)] 97.3 F (36.3 C) (12/06 1606) Pulse Rate:  [92-129] 92 (12/06 1606) Resp:  [17-23] 18 (12/06 1606) BP: (100-134)/(61-99) 100/85 (12/06 1606) SpO2:  [90 %-98 %] 98 % (12/06 1606) Last BM Date : 09/29/24 General: NAD Lungs: Bilateral clear Heart: S1-S2, irregular Abdomen: Soft, mild right upper quadrant and epigastric tenderness Ext: No edema    Intake/Output from previous day: 12/05 0701 - 12/06 0700 In: 5439.1 [I.V.:4223.7; IV Piggyback:1215.4] Out: 225 [Urine:225] Intake/Output this shift: Total I/O In: 2208.1 [P.O.:720; I.V.:892.9; IV Piggyback:595.2] Out: 1200 [Urine:1200]   Lab Results: Recent Labs    09/28/24 0434 09/29/24 0402 09/30/24 0418  WBC 14.8* 10.7* 9.7  HGB 12.1* 10.6* 10.8*  PLT 305 269 310  MCV 86.0 86.8 84.9   BMET Recent Labs    09/28/24 0434 09/29/24 0402 09/30/24 0418  NA 131* 131* 127*  K 4.0 3.9 4.0  CL 103 98 98  CO2 21* 22 21*  GLUCOSE 108* 100* 157*  BUN 12 16 20   CREATININE 1.27* 1.06 1.03  CALCIUM  8.0* 7.7* 7.7*   LFT Recent Labs    09/28/24 1219 09/29/24 0402 09/30/24 0418  PROT 6.0* 5.6* 5.9*  ALBUMIN 2.7* 2.3* 2.5*  AST 63* 38 25  ALT 99* 80* 68*  ALKPHOS 218* 168* 146*  BILITOT 2.1* 1.4* 1.2  BILIDIR 1.1*  --   --   IBILI 1.0*  --   --    PT/INR Recent Labs    09/27/24 2132  INR 1.5*      Imaging/Other results: DG ERCP Result Date: 09/30/2024 EXAM: 19 SPOT IMAGES FROM AN ERCP 09/29/2024 05:20:00 PM COMPARISON: None Available. CLINICAL HISTORY: 461500 Elective surgery 461500 Elective surgery 461500 FLUOROSCOPY DOSE AND TYPE: Fluoroscopy time: 10 minutes 47 seconds Radiation Dose Index: Reference Air Kerma (in mGy) = 279.72 FINDINGS: Endoscopy and cannulation of the major papilla  was performed by the gastroenterology service. Spot views are presented for interpretation. These images demonstrate endoscopic retrograde cannulation of the ampulla and subsequent selective cannulation of both the terminal Pancreatic Duct and terminal Common Bile Duct with retrograde contrast injection. No pancreatic divisum. The Pancreatic Duct is nondilated and no intraluminal filling defect is identified. Subsequent images demonstrate guidewire placement and internal biliary stenting of the terminal pancreatic duct with good evacuation of injected contrast. Subsequent retrograde cannulation, guidewire placement, and balloon sweep of the Common Bile Duct. No intra or extrahepatic Biliary Ductal Dilation. There is suspected extravasation of contrast in the region of the Cystic Duct Stump in keeping with a Biliary Leak. Subsequently imaged demonstrates internal biliary stenting with good evacuation of contrast from the Biliary Tree. IMPRESSION: 1. Suspected biliary leak at the cystic duct stump. 2. Internal stenting of the common bile duct and pancreatic duct with good evacuation of contrast. NOTE: Please refer to the procedure report for further details. Electronically signed by: Dorethia Molt MD 09/30/2024 04:32 AM EST RP Workstation: [id number]   DG CHEST PORT 1 VIEW Result Date: 09/30/2024 EXAM: 1 VIEW(S) XRAY OF THE CHEST 09/30/2024 04:08:11 AM COMPARISON: 09/28/2024 CLINICAL HISTORY: Acute dyspnea FINDINGS: LUNGS AND PLEURA: Central pulmonary vascular engorgement without superimposed overt pulmonary edema. Asymmetric opacification of the right lung base likely relates to a posterior layering small to moderate right pleural effusion. Small left pleural effusion noted.  No pneumothorax. HEART AND MEDIASTINUM: Cardiomegaly. No acute abnormality of the mediastinal silhouette. BONES AND SOFT TISSUES: No acute osseous abnormality. IMPRESSION: 1. Asymmetric opacification of the right lung base, likely related to a  posterior layering small to moderate right pleural effusion. 2. Small left pleural effusion. 3. Cardiomegaly and central pulmonary vascular engorgement without superimposed overt pulmonary edema. Electronically signed by: Dorethia Molt MD 09/30/2024 04:26 AM EST RP Workstation: [id number]   DG C-Arm 1-60 Min-No Report Result Date: 09/29/2024 Fluoroscopy was utilized by the requesting physician.  No radiographic interpretation.   DG C-Arm 1-60 Min-No Report Result Date: 09/29/2024 Fluoroscopy was utilized by the requesting physician.  No radiographic interpretation.   DG C-Arm 1-60 Min-No Report Result Date: 09/29/2024 Fluoroscopy was utilized by the requesting physician.  No radiographic interpretation.      Assessment &Plan  83 year old male status post lap chole with retained biliary stone/choledocholithiasis status post ERCP 09/29/2024 by Dr. Wilhelmenia with sphincterotomy, stone extraction and placed plastic biliary stent  He has bile leak from cystic duct and possible biloma/?  Abscess, defer management to surgery, considering drain placement by IR Continue IV antibiotics  Patient will need repeat KUB in 2 weeks and possible repeat to ERCP in 2 months to be arranged by Dr. Wilhelmenia  Continue soft diet Continue IV heparin   Inpatient GI signing off, available if have any questions   I have spent >35 minutes of patient care (this includes precharting, chart review, review of results, face-to-face time used for counseling as well as treatment plan and follow-up. The patient was provided an opportunity to ask questions and all were answered. The patient agreed with the plan and demonstrated an understanding of the instructions.    LOIS Wilkie Mcgee , MD [phone]  [facility]

## 2024-09-30 NOTE — Progress Notes (Signed)
 Mobility Specialist Progress Note:    09/30/24 1447  Mobility  Activity Pivoted/transferred from bed to chair  Level of Assistance Minimal assist, patient does 75% or more  Assistive Device Front wheel walker  Distance Ambulated (ft) 5 ft  Activity Response Tolerated well  Mobility Referral Yes  Mobility visit 1 Mobility  Mobility Specialist Start Time (ACUTE ONLY) 1403  Mobility Specialist Stop Time (ACUTE ONLY) 1420  Mobility Specialist Time Calculation (min) (ACUTE ONLY) 17 min   Rceived pt in bed and agreeable to mobility. Pt required MinA STS, otherwise MinG. Pt c/o SOB; SPO2 @ 95%-97%. Left pt in chair with personal belongings and call light within reach. All needs met.  Lavanda Pollack Mobility Specialist  Please contact via [employer] or  Rehab Office [phone]

## 2024-09-30 NOTE — Progress Notes (Signed)
 PHARMACY - ANTICOAGULATION CONSULT NOTE  Pharmacy Consult for Heparin  Indication: atrial fibrillation  Allergies  Allergen Reactions   Amoxicillin Hives and Other (See Comments)    REACTION: whelps/pain in knees and ankles   Penicillin G     Other reaction(s): hives    Patient Measurements: Height: 6' 2 (188 cm) Weight: 99.8 kg (220 lb 0.3 oz) IBW/kg (Calculated) : 82.2 HEPARIN  DW (KG): 99.8  Vital Signs: Temp: 97.4 F (36.3 C) (12/06 1128) Temp Source: Oral (12/06 1128) BP: 120/89 (12/06 0835) Pulse Rate: 122 (12/06 0835)  Labs: Recent Labs    09/27/24 2132 09/28/24 0434 09/28/24 1219 09/29/24 0402 09/30/24 0418 09/30/24 1050  HGB 12.4* 12.1*  --  10.6* 10.8*  --   HCT 36.6* 35.6*  --  31.5* 31.4*  --   PLT 311 305  --  269 310  --   APTT  --   --  50*  --   --  56*  LABPROT 19.0*  --   --   --   --   --   INR 1.5*  --   --   --   --   --   HEPARINUNFRC  --   --  >1.10*  --   --  0.76*  CREATININE 1.39* 1.27*  --  1.06 1.03  --     Estimated Creatinine Clearance: 69.8 mL/min (by C-G formula based on SCr of 1.03 mg/dL).  Assessment: 44 yoM on apixaban  PTA for afib, last dose 12/3 AM. Apixaban  held for ERCP.  Pharmacy consulted for heparin .  Planning to use aPTTs for heparin  monitoring while heparin  levels are expected to be falsely high from recent apixaban  doses.  Heparin  drip held for ERCP on 12/5 and resumed ~3am on 12/6 per GI. aPTT is subtherapeutic (56 seconds) and heparin  level is 0.76 (falsely elevated) on 1600 units/hr.    Goal of Therapy:  Heparin  level 0.3-0.7 units/ml aPTT 66-102 seconds Monitor platelets by anticoagulation protocol: Yes   Plan:  Increase heparin  drip to 1750 units/hr. aPTT ~8 hrs after rate change. Daily aPTT and heparin  level until correlating; daily CBC. Follow up for resuming apixaban  when able.  Genaro Zebedee Calin, RPh 09/30/2024,1:22 PM

## 2024-09-30 NOTE — Progress Notes (Addendum)
 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 Patient presented to the ED on 12/3 with complaints of weakness, tachycardia, and worsening right sided abdominal pain. Workup revealed WBC of 16.8, elevated AST, ALT and Alk phos, T. Bili of 3.9, lipase of 146, and imaging significant for a 6.7 x 2.1 cm fluid collection in the gallbladder fossa with 2 gallstones measuring 6mm in the distal CBD at the ampulla and likely a 3rd gallstone within the duodenum. GI consulted and patient underwent an ERCP on 12/5 which revealed suspected extravasation of contrast in the region of the cystic duct stump in keeping with a biliary leak. IR subsequently consulted by Dr. SHAUNNA Null for possible percutaneous drain placement. Case and imaging reviewed by Dr. JINNY Hall who does not feel that placement is warranted at this time.   IR to delete the current consult order.   If there is concern for interval growth of gallbladder fossa fluid collection, recommend repeat CT A/P with contrast for additional evaluation. If patient status changes or repeat imaging is positive for interval growth of fluid collection, please place a new IR consult order and reach out to our team.   Team updated and agreeable to plan.  Electronically Signed: Cameren Odwyer M Abiageal Blowe, PA-C 09/30/2024, 11:42 AM

## 2024-09-30 NOTE — Progress Notes (Signed)
 Progress Note  Patient Name: Lance Hubbard Date of Encounter: 09/30/2024 [facility] Cardiologist: None   Interval Summary   Patient underwent EGD yesterday finding stone on cholangiogram and choledocholithiasis with complete removal by biliary stent durotomy.  A bile leak was also found.  Status post pancreatic stent and common bile duct stent.  Plan is for ERCP to remove stent in 2 months.  Denies any chest pain but started developing shortness of breath last night.  Chest x-ray this morning showed asymmetric opacification of the right lung base likely due to small to moderate right pleural effusion, small left pleural effusion, cardiomegaly and central pulmonary vascular engorgement.  Also complains that his abdomen is tight.  Vital Signs Vitals:   09/29/24 2018 09/29/24 2345 09/30/24 0324 09/30/24 0423  BP: (!) 125/99 (!) 134/94  (!) 132/92  Pulse: (!) 125 (!) 112 (!) 103 99  Resp: 20   17  Temp: 98 F (36.7 C) 97.7 F (36.5 C)  98 F (36.7 C)  TempSrc: Oral Oral  Oral  SpO2: 98% 97% 98%   Weight:      Height:        Intake/Output Summary (Last 24 hours) at 09/30/2024 0753 Last data filed at 09/29/2024 1800 Gross per 24 hour  Intake 5439.13 ml  Output 225 ml  Net 5214.13 ml      09/29/2024    2:25 PM 09/27/2024    9:47 PM 09/22/2024   10:53 AM  Last 3 Weights  Weight (lbs) 220 lb 0.3 oz 220 lb 220 lb  Weight (kg) 99.8 kg 99.791 kg 99.791 kg      Telemetry/ECG  Atrial fibrillation with RVR in the low 100s- Personally Reviewed  Physical Exam  GEN: Well nourished, well developed in no acute distress HEENT: Normal NECK: No JVD; No carotid bruits LYMPHATICS: No lymphadenopathy CARDIAC: Irregularly irregular and mildly tachy, no murmurs, rubs, gallops RESPIRATORY: Decreased breath sounds at right base with crackles at right and left base ABDOMEN: Soft, non-tender, but abdomen feels full and firm MUSCULOSKELETAL:  No edema; No deformity  SKIN: Warm and  dry NEUROLOGIC:  Alert and oriented x 3 PSYCHIATRIC:  Normal affect   Assessment & Plan  Lance Hubbard is a 83 y.o. 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  Atrial fibrillation with RVR - Status post remote A-fib ablation 06/14/2021 and cardioversion 01/13/2024  - he has not had any recurrence of A-fib until recently when he was hospitalized with gallstone pancreatitis - EKG prior to discharge in November showed sinus rhythm with PACs - He said he felt onset of palpitations  and knew he was back in A-fib - Suspect that recent acute illness (A-fib last month in the setting of gallstone pancreatitis and now likely sepsis from intra-abdominal source with CT also concerning for right lower lobe pneumonia and abscess after recent cholecystectomy) has triggered his A-fib with RVR - Currently on IV Amio at 60 mg/h - Heart rate now improved into the low 100s but when sitting up and talking goes up into the 120s -Heart rate did not really improve with the higher dose of Amio so I will take his IV Amio back down to 30 mg/h after a bolus of 150 mg -BP is good so we will start with XL 25 mg daily for heart rate control -Continue IV Amio for now until acute  illness has improved heart rate should improve with treatment of sepsis - He was on Eliquis  at home for CHA2DS2-VASc score of 3 which has been held for procedures - Continue IV heparin  drip   Acute on chronic HFrEF Dilated cardiomyopathy Pulmonary hypertension Hypertension - 2D echo 09/14/2024 showed EF 30 to 35% with global HK, moderate to severe pulmonary hypertension - PTA Entresto  on hold due to acute illness and soft blood pressure -Complaint shortness of breath and increased abdominal fullness last night.  Chest x-ray this morning shows small to moderate right pleural effusion/small left pleural effusion and central pulmonary  vascular engorgement with no frank pulmonary edema; O2 saturations 99 % on 2 L [place] - Suspect poorly controlled atrial fibrillation is contributing to CHF - Urine has been very dark for several days and even prior to admission which I suspect is related to bilirubin but will check a UA since he is on IV heparin  - SCr 1.03, NA 127 (down from 131); K+ 4 - With Na trending downward, chest x-ray and exam consistent with volume overload will start Lasix  40 mg IV twice daily - Follow strict I's and O's, daily weights and renal function while diuresing - Will not restart GDMT for now as we need adequate blood pressure to titrate heart rate controlling drugs - Once he is over his acute illness we can get him on adequate GDMT as BP tolerates   Abdominal abscess Recent pancreatitis status post cholecystectomy Sepsis - Status post laparoscopic cholecystectomy 09/22/2024 for gallstone pancreatitis - Now back with increased abdominal pain and leukocytosis - CT with possible abscess near cholecystectomy site - Continue IV antibiotics per TRH and surgery - ERCP yesterday showed choledocholithiasis status post biliary stent and pancreatic stent   Coronary artery calcifications Hyperlipidemia -Noted to have coronary artery calcifications on CT 05/14/2021 for A-fib ablation.  Coronary calcium  score was 419 - Denies any chest pain - Continue Crestor  5 mg daily - No ASA due to DOAC use for A-fib   I spent 35 minutes caring for this patient today face to face, ordering and reviewing labs, reviewing records from 2D echo 09/21/2024, chest x-ray 09/30/2024, endoscopy report from 09/29/2024, seeing the patient, documenting in the record  Otherwise management per primary.   She I have been rounder to be this week so I will do rounder be this weekend For questions or updates, please contact [facility] Please consult [email or web address] for contact info under         Signed, Wilbert Bihari, MD

## 2024-09-30 NOTE — Progress Notes (Signed)
 PHARMACY - ANTICOAGULATION CONSULT NOTE  Pharmacy Consult for Heparin  Indication: atrial fibrillation  Allergies  Allergen Reactions   Amoxicillin Hives and Other (See Comments)    REACTION: whelps/pain in knees and ankles   Penicillin G     Other reaction(s): hives    Patient Measurements: Height: 6' 2 (188 cm) Weight: 99.8 kg (220 lb 0.3 oz) IBW/kg (Calculated) : 82.2 HEPARIN  DW (KG): 99.8  Vital Signs: Temp: 97.6 F (36.4 C) (12/06 2156) Temp Source: Oral (12/06 2156) BP: 115/79 (12/06 2156) Pulse Rate: 100 (12/06 2156)  Labs: Recent Labs    09/28/24 0434 09/28/24 1219 09/29/24 0402 09/30/24 0418 09/30/24 1050 09/30/24 2151  HGB 12.1*  --  10.6* 10.8*  --   --   HCT 35.6*  --  31.5* 31.4*  --   --   PLT 305  --  269 310  --   --   APTT  --  50*  --   --  56* 54*  HEPARINUNFRC  --  >1.10*  --   --  0.76*  --   CREATININE 1.27*  --  1.06 1.03  --   --     Estimated Creatinine Clearance: 69.8 mL/min (by C-G formula based on SCr of 1.03 mg/dL).  Assessment: 83 yoM on apixaban  PTA for afib, last dose 12/3 AM. Apixaban  held for ERCP.  Pharmacy consulted for heparin .  Planning to use aPTTs for heparin  monitoring while heparin  levels are expected to be falsely high from recent apixaban  doses.  Heparin  drip held for ERCP on 12/5 and resumed ~3am on 12/6 per GI.  PM: aPTT is subtherapeutic (54 seconds) after rate increase to 1750 units/hr and last heparin  level is falsely elevated on 1750 units/hr. Per RN, no issues with the heparin  running continuously   Goal of Therapy:  Heparin  level 0.3-0.7 units/ml aPTT 66-102 seconds Monitor platelets by anticoagulation protocol: Yes   Plan:  Increase heparin  drip to 2100 units/hr. aPTT ~8 hrs after rate change. Daily aPTT and heparin  level until correlating; daily CBC. Follow up for resuming apixaban  when able.  Lynwood Poplar, PharmD, BCPS Clinical Pharmacist 09/30/2024 10:46 PM

## 2024-09-30 NOTE — Progress Notes (Signed)
 1 Day Post-Op   Subjective/Chief Complaint: Complains of feeling bloated   Objective: Vital signs in last 24 hours: Temp:  [97.6 F (36.4 C)-98.3 F (36.8 C)] (P) 97.7 F (36.5 C) (12/06 0815) Pulse Rate:  [99-129] 99 (12/06 0423) Resp:  [17-23] 17 (12/06 0423) BP: (111-134)/(61-99) 132/92 (12/06 0423) SpO2:  [90 %-98 %] 98 % (12/06 0324) Weight:  [99.8 kg] 99.8 kg (12/05 1425) Last BM Date : 09/29/24  Intake/Output from previous day: 12/05 0701 - 12/06 0700 In: 5439.1 [I.V.:4223.7; IV Piggyback:1215.4] Out: 225 [Urine:225] Intake/Output this shift: No intake/output data recorded.  General appearance: alert and cooperative Resp: clear to auscultation bilaterally Cardio: irregularly irregular rhythm GI: soft, distended. Minimal tenderness  Lab Results:  Recent Labs    09/29/24 0402 09/30/24 0418  WBC 10.7* 9.7  HGB 10.6* 10.8*  HCT 31.5* 31.4*  PLT 269 310   BMET Recent Labs    09/29/24 0402 09/30/24 0418  NA 131* 127*  K 3.9 4.0  CL 98 98  CO2 22 21*  GLUCOSE 100* 157*  BUN 16 20  CREATININE 1.06 1.03  CALCIUM  7.7* 7.7*   PT/INR Recent Labs    09/27/24 2132  LABPROT 19.0*  INR 1.5*   ABG No results for input(s): PHART, HCO3 in the last 72 hours.  Invalid input(s): PCO2, PO2  Studies/Results: DG ERCP Result Date: 09/30/2024 EXAM: 19 SPOT IMAGES FROM AN ERCP 09/29/2024 05:20:00 PM COMPARISON: None Available. CLINICAL HISTORY: 461500 Elective surgery 461500 Elective surgery 461500 FLUOROSCOPY DOSE AND TYPE: Fluoroscopy time: 10 minutes 47 seconds Radiation Dose Index: Reference Air Kerma (in mGy) = 279.72 FINDINGS: Endoscopy and cannulation of the major papilla was performed by the gastroenterology service. Spot views are presented for interpretation. These images demonstrate endoscopic retrograde cannulation of the ampulla and subsequent selective cannulation of both the terminal Pancreatic Duct and terminal Common Bile Duct with retrograde  contrast injection. No pancreatic divisum. The Pancreatic Duct is nondilated and no intraluminal filling defect is identified. Subsequent images demonstrate guidewire placement and internal biliary stenting of the terminal pancreatic duct with good evacuation of injected contrast. Subsequent retrograde cannulation, guidewire placement, and balloon sweep of the Common Bile Duct. No intra or extrahepatic Biliary Ductal Dilation. There is suspected extravasation of contrast in the region of the Cystic Duct Stump in keeping with a Biliary Leak. Subsequently imaged demonstrates internal biliary stenting with good evacuation of contrast from the Biliary Tree. IMPRESSION: 1. Suspected biliary leak at the cystic duct stump. 2. Internal stenting of the common bile duct and pancreatic duct with good evacuation of contrast. NOTE: Please refer to the procedure report for further details. Electronically signed by: Dorethia Molt MD 09/30/2024 04:32 AM EST RP Workstation: [id number]   DG CHEST PORT 1 VIEW Result Date: 09/30/2024 EXAM: 1 VIEW(S) XRAY OF THE CHEST 09/30/2024 04:08:11 AM COMPARISON: 09/28/2024 CLINICAL HISTORY: Acute dyspnea FINDINGS: LUNGS AND PLEURA: Central pulmonary vascular engorgement without superimposed overt pulmonary edema. Asymmetric opacification of the right lung base likely relates to a posterior layering small to moderate right pleural effusion. Small left pleural effusion noted. No pneumothorax. HEART AND MEDIASTINUM: Cardiomegaly. No acute abnormality of the mediastinal silhouette. BONES AND SOFT TISSUES: No acute osseous abnormality. IMPRESSION: 1. Asymmetric opacification of the right lung base, likely related to a posterior layering small to moderate right pleural effusion. 2. Small left pleural effusion. 3. Cardiomegaly and central pulmonary vascular engorgement without superimposed overt pulmonary edema. Electronically signed by: Dorethia Molt MD 09/30/2024 04:26 AM EST RP Workstation:  [id number]   DG C-Arm 1-60 Min-No Report Result Date: 09/29/2024 Fluoroscopy was utilized by the requesting physician.  No radiographic interpretation.   DG C-Arm 1-60 Min-No Report Result Date: 09/29/2024 Fluoroscopy was utilized by the requesting physician.  No radiographic interpretation.   DG C-Arm 1-60 Min-No Report Result Date: 09/29/2024 Fluoroscopy was utilized by the requesting physician.  No radiographic interpretation.   DG CHEST PORT 1 VIEW Result Date: 09/28/2024 CLINICAL DATA:  AFib. EXAM: PORTABLE CHEST 1 VIEW COMPARISON:  CT abdomen/pelvis dated 09/27/2024. Chest radiograph 09/22/2019. FINDINGS: Low lung volumes. Cardiomegaly. Redemonstrated small layering right pleural effusion with fluid in the minor fissure. Left lung appears clear. No pneumothorax. No acute osseous abnormality. IMPRESSION: 1. Redemonstrated small layering right pleural effusion. 2. Cardiomegaly. Electronically Signed   By: Harrietta Sherry M.D.   On: 09/28/2024 11:21    Anti-infectives: Anti-infectives (From admission, onward)    Start     Dose/Rate Route Frequency Ordered Stop   09/28/24 1200  ceFEPIme  (MAXIPIME ) 2 g in sodium chloride  0.9 % 100 mL IVPB        2 g 200 mL/hr over 30 Minutes Intravenous Every 12 hours 09/28/24 0810     09/28/24 0815  metroNIDAZOLE  (FLAGYL ) IVPB 500 mg        500 mg 100 mL/hr over 60 Minutes Intravenous Every 12 hours 09/28/24 0803     09/28/24 0815  vancomycin  (VANCOREADY) IVPB 1500 mg/300 mL        1,500 mg 150 mL/hr over 120 Minutes Intravenous Every 24 hours 09/28/24 0810     09/28/24 0015  ceFEPIme  (MAXIPIME ) 2 g in sodium chloride  0.9 % 100 mL IVPB        2 g 200 mL/hr over 30 Minutes Intravenous  Once 09/28/24 0005 09/28/24 0148   09/28/24 0015  metroNIDAZOLE  (FLAGYL ) IVPB 500 mg        500 mg 100 mL/hr over 60 Minutes Intravenous  Once 09/28/24 0005 09/28/24 0249       Assessment/Plan: s/p Procedure(s): ERCP, WITH INTERVENTION IF INDICATED (N/A) ERCP  yesterday was successful in clearing duct Small bile leak was noted. Will ask IR if he may need drain although fluid collection from 3 days ago was likely too small to drain May have ileus. Will monitor for return of bowel function Biliary pancreatitis   POD 8, s/p laparoscopic cholecystectomy  Choledocholithiasis - CT 12/3 with 6.7x2.1 cm collection in GB fossa may be post-op but adjacent fat stranding and fluid tracking inferiorly, possible abscess vs biloma, reactive duodenitis, choledocholithiasis, moderate to large stool burden - WBC 9.7 today, AF - LFTs downtrending today - GI for ERCP yesterday.  - ERCP did show bile leak present, consider IR drainage - surgical team will follow  -abx per primary teams   FEN: NPO for ERCP, IVF per [facility] VTE: SCDs, hep gtt ID: cefepime /vanc/flagyl    - per [facility] -  A. Fib with RVR - cardiology follwing HTN CML Osteoarthritis   LOS: 2 days    Lance Hubbard 09/30/2024

## 2024-09-30 NOTE — Plan of Care (Signed)

## 2024-09-30 NOTE — Anesthesia Postprocedure Evaluation (Signed)
 Anesthesia Post Note  Patient: Lance Hubbard  Procedure(s) Performed: ERCP, WITH INTERVENTION IF INDICATED     Patient location during evaluation: PACU Anesthesia Type: General Level of consciousness: awake Pain management: pain level controlled Vital Signs Assessment: post-procedure vital signs reviewed and stable Respiratory status: spontaneous breathing, nonlabored ventilation and respiratory function stable Cardiovascular status: blood pressure returned to baseline and stable Postop Assessment: no apparent nausea or vomiting Anesthetic complications: no   No notable events documented.  Last Vitals:  Vitals:   09/30/24 0324 09/30/24 0423  BP:  (!) 132/92  Pulse: (!) 103 99  Resp:  17  Temp:  36.7 C  SpO2: 98%     Last Pain:  Vitals:   09/30/24 0423  TempSrc: Oral  PainSc:                  Bernardino SQUIBB Harlie Ragle

## 2024-09-30 NOTE — Progress Notes (Signed)
 PROGRESS NOTE  Lance Hubbard FMW:[id number] DOB: 1940-12-17 DOA: 09/27/2024 PCP: Frederik Charleston, MD  HPI/Recap of past 24 hours: Lance Hubbard is a 83 y.o. male with medical history significant for hypertension, chronic atrial fibrillation, CML and osteoarthritis, who presented to the ER with acute onset of severe upper abdominal pain with associated hypotension. Pt is s/p laparoscopic cholecystectomy on 11/28, now reporting subsequent severe epigastric pain radiating to his right lateral abdomen. In the ED, BP was 118/77, HR 171, RR 26. Labs revealed lipase 146 AST 69 ALT 102, total bili of 3.9 up from 1.4, WBC 16.8. EKG showed atrial fibrillation with RVR, PVCs and low voltage QRS.  CT abdomen pelvis showed 2 stones in the distal CBD, with third gallstone within the duodenum all measuring about 6 mm, noted interval change of cholecystectomy with a 6.7 x 2.1 cm fluid collection in the gallbladder fossa, adjacent fat stranding, and fluid tracking inferiorly in the pericolic gutter. Findings may be postoperative however are concerning for developing abscess vs concern for biliary leak, HIDA scan is recommended for further evaluation. Moderate right pleural effusion and atelectasis or pneumonia in the right lower lobe, moderate to large colonic stool burden.  Cardiology, GI and general surgery consulted.  Patient admitted for further management.     Overnight, pt noted to be SOB, denies any chest pain. Still with intermittent liquid stool. Abd pain improving.    Assessment/Plan: Principal Problem:   Atrial fibrillation with RVR (HCC) Active Problems:   Elevated LFTs   CML (chronic myelocytic leukemia) (HCC)   Acute on chronic HFrEF (heart failure with reduced ejection fraction) (HCC)   Dyslipidemia   Hypothyroidism   Family history of CML (chronic monocytic leukemia)   Coronary artery calcification seen on CAT scan   Pure hypercholesterolemia   RUQ pain   Dilated cardiomyopathy (HCC)    Pulmonary hypertension, unspecified (HCC)   Sepsis likely choledocholithiasis, biliary leak Elevated LFTs ??Intra-abdominal abscess On presentation, noted to be tachycardic, tachypneic, hypotension with leukocytosis Currently afebrile, resolving leukocytosis BC x 2 NGTD Noted elevated LFTs including T. Bili CT abdomen/pelvis showed 2 stones in the distal CBD, with third gallstone within the duodenum all measuring about 6 mm, noted interval change of cholecystectomy with a 6.7 x 2.1 cm fluid collection in the gallbladder fossa, adjacent fat stranding, and fluid tracking inferiorly in the pericolic gutter. Findings may be postoperative however are concerning for developing abscess vs concern for biliary leak, HIDA scan is recommended for further evaluation GI consulted, s/p 09/29/2024 showed choledocholithiasis with biliary sphincterotomy, s/p stent in both pancreatic and common bile duct.  Noted biliary leak.  Repeat ERCP to remove stents in 2 months IR consulted for possible percutaneous drain placement, recommended drain not warranted at this time, if concern or signs of clinical deterioration, recommend repeat CT AP with contrast for additional evaluation, if worsening fluid collection, reconsult IR Continue IV cefepime , Flagyl , vancomycin  Pain management Monitor closely  AKI Hyponatremia- chronic Creatinine baseline around 0.9, on admission 1.39 Daily CMP  Chronic atrial fibrillation with RVR Heart rate uncontrolled Cardiology consulted, on IV amiodarone  drip, heparin  Telemetry   Chronic systolic CHF BNP 505, no previous to compare Chest x-ray showed central pulmonary vascular engorgement, small to moderate right pleural effusion Last echo done 08/2024 showed EF of 30 to 35%, global hypokinesis, severe pulmonary hypertension 65.1 mmHg Start IV Lasix  Hold home Entresto  for now given hypotension on presentation   CML (chronic myelocytic leukemia)  Hold PTA bosutinib  Dyslipidemia Hold statins given elevated LFTs   Hypothyroidism Continue Synthroid  once able      Estimated body mass index is 28.25 kg/m as calculated from the following:   Height as of this encounter: 6' 2 (1.88 m).   Weight as of this encounter: 99.8 kg.     Code Status: Full  Family Communication: Wife at bedside  Disposition Plan: Status is: Inpatient Remains inpatient appropriate because: Level of care      Consultants: Cardiology General Surgery GI  Procedures: ERCP  Antimicrobials: Cefepime  Flagyl  Vancomycin   DVT prophylaxis: IV heparin    Objective: Vitals:   09/30/24 0815 09/30/24 0835 09/30/24 1128 09/30/24 1606  BP:  120/89  100/85  Pulse:  (!) 122  92  Resp:  18 18 18   Temp: 97.7 F (36.5 C) 97.7 F (36.5 C) (!) 97.4 F (36.3 C) (!) 97.3 F (36.3 C)  TempSrc:  Oral Oral Oral  SpO2:  97%  98%  Weight:      Height:        Intake/Output Summary (Last 24 hours) at 09/30/2024 1652 Last data filed at 09/30/2024 1605 Gross per 24 hour  Intake 2708.05 ml  Output 1425 ml  Net 1283.05 ml   Filed Weights   09/27/24 2147 09/29/24 1425  Weight: 99.8 kg 99.8 kg    Exam: General: NAD Cardiovascular: S1, S2 present Respiratory: Diminished breath sounds bilaterally Abdomen: Soft, nontender, mildly distended, bowel sounds present Musculoskeletal: No bilateral pedal edema noted Skin: Normal Psychiatry: Normal mood    Data Reviewed: CBC: Recent Labs  Lab 09/24/24 0318 09/27/24 2132 09/28/24 0434 09/29/24 0402 09/30/24 0418  WBC 11.5* 16.8* 14.8* 10.7* 9.7  NEUTROABS  --  14.2*  --  8.2* 9.0*  HGB 11.7* 12.4* 12.1* 10.6* 10.8*  HCT 34.2* 36.6* 35.6* 31.5* 31.4*  MCV 87.2 85.7 86.0 86.8 84.9  PLT 182 311 305 269 310   Basic Metabolic Panel: Recent Labs  Lab 09/24/24 0318 09/27/24 2132 09/28/24 0434 09/29/24 0402 09/30/24 0418  NA 133* 130* 131* 131* 127*  K 3.8 3.7 4.0 3.9 4.0  CL 103 98 103 98 98  CO2 22 25 21* 22 21*   GLUCOSE 107* 125* 108* 100* 157*  BUN 13 10 12 16 20   CREATININE 0.89 1.39* 1.27* 1.06 1.03  CALCIUM  8.0* 8.3* 8.0* 7.7* 7.7*  MG 2.0 1.9  --   --   --    GFR: Estimated Creatinine Clearance: 69.8 mL/min (by C-G formula based on SCr of 1.03 mg/dL). Liver Function Tests: Recent Labs  Lab 09/27/24 2132 09/28/24 1219 09/29/24 0402 09/30/24 0418  AST 69* 63* 38 25  ALT 102* 99* 80* 68*  ALKPHOS 213* 218* 168* 146*  BILITOT 3.9* 2.1* 1.4* 1.2  PROT 6.0* 6.0* 5.6* 5.9*  ALBUMIN 2.8* 2.7* 2.3* 2.5*   Recent Labs  Lab 09/27/24 2132  LIPASE 146*   No results for input(s): AMMONIA in the last 168 hours. Coagulation Profile: Recent Labs  Lab 09/27/24 2132  INR 1.5*   Cardiac Enzymes: No results for input(s): CKTOTAL, CKMB, CKMBINDEX, TROPONINI in the last 168 hours. BNP (last 3 results) No results for input(s): PROBNP in the last 8760 hours. HbA1C: No results for input(s): HGBA1C in the last 72 hours. CBG: No results for input(s): GLUCAP in the last 168 hours. Lipid Profile: No results for input(s): CHOL, HDL, LDLCALC, TRIG, CHOLHDL, LDLDIRECT in the last 72 hours. Thyroid  Function Tests: Recent Labs    09/28/24 0250  TSH 3.099  Anemia Panel: No results for input(s): VITAMINB12, FOLATE, FERRITIN, TIBC, IRON, RETICCTPCT in the last 72 hours. Urine analysis:    Component Value Date/Time   COLORURINE AMBER (A) 09/30/2024 0850   APPEARANCEUR CLEAR 09/30/2024 0850   LABSPEC 1.024 09/30/2024 0850   PHURINE 5.0 09/30/2024 0850   GLUCOSEU NEGATIVE 09/30/2024 0850   HGBUR NEGATIVE 09/30/2024 0850   BILIRUBINUR NEGATIVE 09/30/2024 0850   KETONESUR 5 (A) 09/30/2024 0850   PROTEINUR NEGATIVE 09/30/2024 0850   NITRITE NEGATIVE 09/30/2024 0850   LEUKOCYTESUR NEGATIVE 09/30/2024 0850   Sepsis Labs: @LABRCNTIP (procalcitonin:4,lacticidven:4)  ) Recent Results (from the past 240 hours)  Surgical pcr screen     Status: None    Collection Time: 09/22/24  8:58 AM   Specimen: Nasal Mucosa; Nasal Swab  Result Value Ref Range Status   MRSA, PCR NEGATIVE NEGATIVE Final   Staphylococcus aureus NEGATIVE NEGATIVE Final    Comment: (NOTE) The Xpert SA Assay (FDA approved for NASAL specimens in patients 100 years of age and older), is one component of a comprehensive surveillance program. It is not intended to diagnose infection nor to guide or monitor treatment. Performed at [facility], [address], [place], [place] [address]   Blood culture (routine x 2)     Status: None (Preliminary result)   Collection Time: 09/27/24 10:09 PM   Specimen: BLOOD RIGHT ARM  Result Value Ref Range Status   Specimen Description BLOOD RIGHT ARM  Final   Special Requests   Final    BOTTLES DRAWN AEROBIC AND ANAEROBIC Blood Culture adequate volume   Culture   Final    NO GROWTH 3 DAYS Performed at [facility], [address], [place], [place] [address]    Report Status PENDING  Incomplete  Blood culture (routine x 2)     Status: None (Preliminary result)   Collection Time: 09/27/24 10:09 PM   Specimen: BLOOD RIGHT HAND  Result Value Ref Range Status   Specimen Description BLOOD RIGHT HAND  Final   Special Requests   Final    BOTTLES DRAWN AEROBIC AND ANAEROBIC Blood Culture adequate volume   Culture   Final    NO GROWTH 3 DAYS Performed at [facility], [address] [place], [place] [address]    Report Status PENDING  Incomplete  Resp panel by RT-PCR (RSV, Flu A&B, Covid) Anterior Nasal Swab     Status: None   Collection Time: 09/27/24 10:21 PM   Specimen: Anterior Nasal Swab  Result Value Ref Range Status   SARS Coronavirus 2 by RT PCR NEGATIVE NEGATIVE Final   Influenza A by PCR NEGATIVE NEGATIVE Final   Influenza B by PCR NEGATIVE NEGATIVE Final    Comment: (NOTE) The Xpert Xpress SARS-CoV-2/FLU/RSV plus assay is intended as an aid in the diagnosis of influenza from Nasopharyngeal swab specimens  and should not be used as a sole basis for treatment. Nasal washings and aspirates are unacceptable for Xpert Xpress SARS-CoV-2/FLU/RSV testing.  Fact Sheet for Patients: bloggercourse.com  Fact Sheet for Healthcare Providers: seriousbroker.it  This test is not yet approved or cleared by the [place]  FDA and has been authorized for detection and/or diagnosis of SARS-CoV-2 by FDA under an Emergency Use Authorization (EUA). This EUA will remain in effect (meaning this test can be used) for the duration of the COVID-19 declaration under Section 564(b)(1) of the Act, 21 U.S.C. section 360bbb-3(b)(1), unless the authorization is terminated or revoked.     Resp  Syncytial Virus by PCR NEGATIVE NEGATIVE Final    Comment: (NOTE) Fact Sheet for Patients: bloggercourse.com  Fact Sheet for Healthcare Providers: seriousbroker.it  This test is not yet approved or cleared by the [place]  FDA and has been authorized for detection and/or diagnosis of SARS-CoV-2 by FDA under an Emergency Use Authorization (EUA). This EUA will remain in effect (meaning this test can be used) for the duration of the COVID-19 declaration under Section 564(b)(1) of the Act, 21 U.S.C. section 360bbb-3(b)(1), unless the authorization is terminated or revoked.  Performed at [facility], [address] [place], [place] [address]       Studies: DG CHEST PORT 1 VIEW Result Date: 09/28/2024 CLINICAL DATA:  AFib. EXAM: PORTABLE CHEST 1 VIEW COMPARISON:  CT abdomen/pelvis dated 09/27/2024. Chest radiograph 09/22/2019. FINDINGS: Low lung volumes. Cardiomegaly. Redemonstrated small layering right pleural effusion with fluid in the minor fissure. Left lung appears clear. No pneumothorax. No acute osseous abnormality. IMPRESSION: 1. Redemonstrated small layering right pleural effusion. 2. Cardiomegaly.  Electronically Signed   By: Harrietta Sherry M.D.   On: 09/28/2024 11:21   CT ABDOMEN PELVIS W CONTRAST Addendum Date: 09/27/2024 ADDENDUM: Upon further review and discussion with Dr. Teresa, there are two gallstones both measuring 6 mm in the distal common bile duct at the ampulla of Vater ( series 6 image 66). Likely a 3rd gallstone within the duodenum measuring 6 mm ( 6 / 68 ). Mild intrahepatic biliary dilation. Electronically signed by: Norman Gatlin MD 09/27/2024 11:45 PM EST RP Workstation: [id number]   Result Date: 09/27/2024 ORIGINAL REPORT EXAM: CT ABDOMEN AND PELVIS WITH CONTRAST 09/27/2024 10:59:59 PM TECHNIQUE: CT of the abdomen and pelvis was performed with the administration of intravenous contrast. 75 mL iohexol  (OMNIPAQUE ) 350 MG/ML injection was administered. Multiplanar reformatted images are provided for review. Automated exposure control, iterative reconstruction, and/or weight-based adjustment of the mA/kV was utilized to reduce the radiation dose to as low as reasonably achievable. COMPARISON: None available. CLINICAL HISTORY: Abdominal pain, post-op. FINDINGS: LOWER CHEST: Round consolidation in the right lower lobe. Associated moderate right pleural effusion. LIVER: The liver is unremarkable. GALLBLADDER AND BILE DUCTS: Interval change of cholecystectomy. There is a 6.7 x 2.1 cm fluid collection in the gallbladder fossa. Adjacent fat stranding and fluid tracking inferiorly in the pericolic gutter. SPLEEN: No acute abnormality. PANCREAS: No acute abnormality. ADRENAL GLANDS: No acute abnormality. KIDNEYS, URETERS AND BLADDER: No stones in the kidneys or ureters. No hydronephrosis. No perinephric or periureteral stranding. Urinary bladder is unremarkable. GI AND BOWEL: Stomach demonstrates no acute abnormality. There is no bowel obstruction. Moderate to large stool burden in the right colon. Wall thickening and hyper-enhancement of the 2nd and 3rd portions of the duodenum. PERITONEUM AND  RETROPERITONEUM: No ascites. No free air. Fluid tracking inferiorly in the pericolic gutter. VASCULATURE: Aorta is normal in caliber. LYMPH NODES: No lymphadenopathy. REPRODUCTIVE ORGANS: Enlarged prostate. BONES AND SOFT TISSUES: No acute osseous abnormality. Small subcutaneous nodules and loculated gas in the anterior subcutaneous fat likely due to injections. Moderate right and small left fat-containing inguinal hernias. IMPRESSION: 1. Interval change of cholecystectomy with a 6.7 x 2.1 cm fluid collection in the gallbladder fossa, adjacent fat stranding, and fluid tracking inferiorly in the pericolic gutter. Findings may be postoperative however are concerning for developing abscess. If there is concern for biliary leak, HIDA scan is recommended for further evaluation. 2. Reactive duodenitis. 3. Moderate right pleural effusion and atelectasis or pneumonia in the right lower lobe. 4.  Moderate to large colonic stool burden. 5. Enlarged prostate. Electronically signed by: Norman Gatlin MD 09/27/2024 11:18 PM EST RP Workstation: [id number]    Scheduled Meds:  furosemide   40 mg Intravenous BID   levothyroxine   25 mcg Oral QAC breakfast   metoprolol  succinate  25 mg Oral Daily   polyethylene glycol  17 g Oral BID   rosuvastatin   5 mg Oral QODAY   senna-docusate  1 tablet Oral BID    Continuous Infusions:  amiodarone  30 mg/hr (09/30/24 1327)   ceFEPime  (MAXIPIME ) IV 2 g (09/30/24 1334)   heparin  1,750 Units/hr (09/30/24 1334)   metronidazole  Stopped (09/30/24 0946)   vancomycin  Stopped (09/30/24 1224)     LOS: 2 days     Lebron JINNY Cage, MD [facility]  If 7PM-7AM, please contact night-coverage [email or web address] 09/30/2024, 4:52 PM

## 2024-10-01 ENCOUNTER — Other Ambulatory Visit: Payer: Self-pay

## 2024-10-01 ENCOUNTER — Inpatient Hospital Stay (HOSPITAL_COMMUNITY)

## 2024-10-01 DIAGNOSIS — I4891 Unspecified atrial fibrillation: Secondary | ICD-10-CM | POA: Diagnosis not present

## 2024-10-01 LAB — CBC WITH DIFFERENTIAL/PLATELET
Abs Immature Granulocytes: 0.15 K/uL — ABNORMAL HIGH (ref 0.00–0.07)
Basophils Absolute: 0 K/uL (ref 0.0–0.1)
Basophils Relative: 0 %
Eosinophils Absolute: 0 K/uL (ref 0.0–0.5)
Eosinophils Relative: 0 %
HCT: 32.9 % — ABNORMAL LOW (ref 39.0–52.0)
Hemoglobin: 11.3 g/dL — ABNORMAL LOW (ref 13.0–17.0)
Immature Granulocytes: 1 %
Lymphocytes Relative: 6 %
Lymphs Abs: 0.9 K/uL (ref 0.7–4.0)
MCH: 28.9 pg (ref 26.0–34.0)
MCHC: 34.3 g/dL (ref 30.0–36.0)
MCV: 84.1 fL (ref 80.0–100.0)
Monocytes Absolute: 1.3 K/uL — ABNORMAL HIGH (ref 0.1–1.0)
Monocytes Relative: 9 %
Neutro Abs: 12.6 K/uL — ABNORMAL HIGH (ref 1.7–7.7)
Neutrophils Relative %: 84 %
Platelets: 388 K/uL (ref 150–400)
RBC: 3.91 MIL/uL — ABNORMAL LOW (ref 4.22–5.81)
RDW: 14.6 % (ref 11.5–15.5)
WBC: 15 K/uL — ABNORMAL HIGH (ref 4.0–10.5)
nRBC: 0 % (ref 0.0–0.2)

## 2024-10-01 LAB — COMPREHENSIVE METABOLIC PANEL WITH GFR
ALT: 58 U/L — ABNORMAL HIGH (ref 0–44)
AST: 21 U/L (ref 15–41)
Albumin: 2.6 g/dL — ABNORMAL LOW (ref 3.5–5.0)
Alkaline Phosphatase: 121 U/L (ref 38–126)
Anion gap: 6 (ref 5–15)
BUN: 29 mg/dL — ABNORMAL HIGH (ref 8–23)
CO2: 22 mmol/L (ref 22–32)
Calcium: 7.8 mg/dL — ABNORMAL LOW (ref 8.9–10.3)
Chloride: 101 mmol/L (ref 98–111)
Creatinine, Ser: 1.11 mg/dL (ref 0.61–1.24)
GFR, Estimated: >60 mL/min (ref >60)
Glucose, Bld: 120 mg/dL — ABNORMAL HIGH (ref 70–99)
Potassium: 3.8 mmol/L (ref 3.5–5.1)
Sodium: 129 mmol/L — ABNORMAL LOW (ref 135–145)
Total Bilirubin: 0.8 mg/dL (ref 0.0–1.2)
Total Protein: 6.1 g/dL — ABNORMAL LOW (ref 6.5–8.1)

## 2024-10-01 LAB — APTT: aPTT: 99 s — ABNORMAL HIGH (ref 24–36)

## 2024-10-01 LAB — BRAIN NATRIURETIC PEPTIDE: B Natriuretic Peptide: 441.8 pg/mL — ABNORMAL HIGH (ref 0.0–100.0)

## 2024-10-01 LAB — HEPARIN LEVEL (UNFRACTIONATED): Heparin Unfractionated: 0.62 [IU]/mL (ref 0.30–0.70)

## 2024-10-01 MED ORDER — SODIUM CHLORIDE 0.9% FLUSH
10.0000 mL | Freq: Two times a day (BID) | INTRAVENOUS | Status: DC
  Administered 2024-10-01 – 2024-10-10 (×13): 10 mL

## 2024-10-01 MED ORDER — FUROSEMIDE 10 MG/ML IJ SOLN: 80.0000 mg | Freq: Two times a day (BID) | INTRAMUSCULAR | Status: DC

## 2024-10-01 MED ORDER — FUROSEMIDE 10 MG/ML IJ SOLN
80.0000 mg | Freq: Two times a day (BID) | INTRAMUSCULAR | Status: DC
  Administered 2024-10-01 – 2024-10-02 (×4): 80 mg via INTRAVENOUS
  Filled 2024-10-01 (×4): qty 8

## 2024-10-01 MED ORDER — SODIUM CHLORIDE 0.9% FLUSH
10.0000 mL | INTRAVENOUS | Status: DC | PRN
  Administered 2024-10-04: 10 mL

## 2024-10-01 MED ORDER — SODIUM CHLORIDE 0.9% FLUSH
10.0000 mL | Freq: Two times a day (BID) | INTRAVENOUS | Status: DC
  Administered 2024-10-01 – 2024-10-05 (×11): 10 mL
  Administered 2024-10-06: 20 mL
  Administered 2024-10-06: 10 mL
  Administered 2024-10-07: 30 mL
  Administered 2024-10-07: 10 mL
  Administered 2024-10-08: 30 mL
  Administered 2024-10-08: 10 mL
  Administered 2024-10-09: 22:00:00 30 mL
  Administered 2024-10-09 – 2024-10-10 (×2): 10 mL

## 2024-10-01 MED ORDER — IOHEXOL 9 MG/ML PO SOLN
500.0000 mL | ORAL | Status: AC
  Administered 2024-10-01 (×2): 500 mL via ORAL

## 2024-10-01 MED ORDER — FUROSEMIDE 10 MG/ML IJ SOLN: 20.0000 mg | Freq: Once | INTRAMUSCULAR | Status: DC

## 2024-10-01 MED ORDER — IOHEXOL 9 MG/ML PO SOLN
ORAL | Status: AC
  Filled 2024-10-01: qty 1000

## 2024-10-01 MED ORDER — METOPROLOL SUCCINATE ER 25 MG PO TB24
37.5000 mg | ORAL_TABLET | Freq: Every day | ORAL | Status: DC
  Administered 2024-10-01 – 2024-10-03 (×3): 37.5 mg via ORAL
  Filled 2024-10-01 (×3): qty 2

## 2024-10-01 MED ORDER — CHLORHEXIDINE GLUCONATE CLOTH 2 % EX PADS
6.0000 | MEDICATED_PAD | Freq: Every day | CUTANEOUS | Status: DC
  Administered 2024-10-01 – 2024-10-10 (×10): 6 via TOPICAL

## 2024-10-01 MED ORDER — POLYETHYLENE GLYCOL 3350 17 G PO PACK
17.0000 g | PACK | Freq: Every day | ORAL | Status: DC
  Administered 2024-10-01 – 2024-10-07 (×5): 17 g via ORAL
  Filled 2024-10-01 (×7): qty 1

## 2024-10-01 MED ORDER — FUROSEMIDE 10 MG/ML IJ SOLN: 60.0000 mg | Freq: Two times a day (BID) | INTRAMUSCULAR | Status: DC

## 2024-10-01 MED ORDER — SODIUM CHLORIDE 0.9% FLUSH: 10.0000 mL | INTRAVENOUS | Status: DC | PRN

## 2024-10-01 NOTE — Progress Notes (Signed)
 PROGRESS NOTE  Lance Hubbard FMW:[id number] DOB: [birth year] DOA: 09/27/2024 PCP: Frederik Charleston, MD  HPI/Recap of past 24 hours: Lance Hubbard is a 83 y.o. male with medical history significant for hypertension, chronic atrial fibrillation, CML and osteoarthritis, who presented to the ER with acute onset of severe upper abdominal pain with associated hypotension. Pt is s/p laparoscopic cholecystectomy on 11/28, now reporting subsequent severe epigastric pain radiating to his right lateral abdomen. In the ED, BP was 118/77, HR 171, RR 26. Labs revealed lipase 146 AST 69 ALT 102, total bili of 3.9 up from 1.4, WBC 16.8. EKG showed atrial fibrillation with RVR, PVCs and low voltage QRS.  CT abdomen pelvis showed 2 stones in the distal CBD, with third gallstone within the duodenum all measuring about 6 mm, noted interval change of cholecystectomy with a 6.7 x 2.1 cm fluid collection in the gallbladder fossa, adjacent fat stranding, and fluid tracking inferiorly in the pericolic gutter. Findings may be postoperative however are concerning for developing abscess vs concern for biliary leak, HIDA scan is recommended for further evaluation. Moderate right pleural effusion and atelectasis or pneumonia in the right lower lobe, moderate to large colonic stool burden.  Cardiology, GI and general surgery consulted.  Patient admitted for further management.     Today, pt still reports SOB, denies any chest pain. Feels constipated, although having small loose stools which could be overflow.    Assessment/Plan: Principal Problem:   Atrial fibrillation with RVR (HCC) Active Problems:   Elevated LFTs   CML (chronic myelocytic leukemia) (HCC)   Acute on chronic HFrEF (heart failure with reduced ejection fraction) (HCC)   Dyslipidemia   Hypothyroidism   Family history of CML (chronic monocytic leukemia)   Coronary artery calcification seen on CAT scan   Pure hypercholesterolemia   RUQ pain   Dilated  cardiomyopathy (HCC)   Pulmonary hypertension, unspecified (HCC)   Biloma following surgery   Bile leak   Sepsis likely choledocholithiasis, biliary leak Elevated LFTs ??Intra-abdominal abscess On presentation, noted to be tachycardic, tachypneic, hypotension with leukocytosis Currently afebrile, resolving leukocytosis BC x 2 NGTD Noted elevated LFTs including T. Bili CT abdomen/pelvis showed 2 stones in the distal CBD, with third gallstone within the duodenum all measuring about 6 mm, noted interval change of cholecystectomy with a 6.7 x 2.1 cm fluid collection in the gallbladder fossa, adjacent fat stranding, and fluid tracking inferiorly in the pericolic gutter. Findings may be postoperative however are concerning for developing abscess vs concern for biliary leak, HIDA scan is recommended for further evaluation GI consulted, s/p 09/29/2024 showed choledocholithiasis with biliary sphincterotomy, s/p stent in both pancreatic and common bile duct.  Noted biliary leak.  Repeat ERCP to remove stents in 2 months General surgery on board, plan for CT a/p/chest, currently pending IR consulted for possible percutaneous drain placement, recommended drain not warranted at this time, if concern or signs of clinical deterioration, recommend repeat CT AP with contrast for additional evaluation, if worsening fluid collection, reconsult IR Continue IV cefepime , Flagyl , vancomycin  Pain management Monitor closely  AKI Hyponatremia- chronic Creatinine baseline around 0.9, on admission 1.39 Daily CMP  Chronic atrial fibrillation with RVR Heart rate with better control  Cardiology consulted, on IV amiodarone  drip, heparin , continue Toprol  Telemetry   Acute on chronic systolic CHF BNP 505, no previous to compare Chest x-ray showed central pulmonary vascular engorgement, small to moderate right pleural effusion, repeat CXR today Last echo done 08/2024 showed EF of 30 to 35%,  global hypokinesis, severe  pulmonary hypertension 65.1 mmHg Continue IV Lasix  Hold home Entresto  for now    CML (chronic myelocytic leukemia)  Hold PTA bosutinib    Dyslipidemia Hold statins given elevated LFTs   Hypothyroidism Continue Synthroid  once able      Estimated body mass index is 28.25 kg/m as calculated from the following:   Height as of this encounter: 6' 2 (1.88 m).   Weight as of this encounter: 99.8 kg.     Code Status: Full  Family Communication: Wife at bedside  Disposition Plan: Status is: Inpatient Remains inpatient appropriate because: Level of care      Consultants: Cardiology General Surgery GI  Procedures: ERCP  Antimicrobials: Cefepime  Flagyl  Vancomycin   DVT prophylaxis: IV heparin    Objective: Vitals:   10/01/24 0439 10/01/24 0826 10/01/24 0940 10/01/24 1128  BP: 123/86 114/70 (!) 120/97 116/69  Pulse: (!) 103 92 95 (!) 103  Resp: 18 18  16   Temp: (!) 97.4 F (36.3 C) (!) 96.9 F (36.1 C)  98 F (36.7 C)  TempSrc: Oral Oral  Oral  SpO2: 99% 93%  92%  Weight:      Height:        Intake/Output Summary (Last 24 hours) at 10/01/2024 1454 Last data filed at 10/01/2024 1221 Gross per 24 hour  Intake 1387.03 ml  Output 2600 ml  Net -1212.97 ml   Filed Weights   09/27/24 2147 09/29/24 1425  Weight: 99.8 kg 99.8 kg    Exam: General: NAD Cardiovascular: S1, S2 present Respiratory: Diminished breath sounds bilaterally Abdomen: Soft, nontender, mildly distended, bowel sounds present Musculoskeletal: No bilateral pedal edema noted Skin: Normal Psychiatry: Normal mood    Data Reviewed: CBC: Recent Labs  Lab 09/27/24 2132 09/28/24 0434 09/29/24 0402 09/30/24 0418 10/01/24 0505  WBC 16.8* 14.8* 10.7* 9.7 15.0*  NEUTROABS 14.2*  --  8.2* 9.0* 12.6*  HGB 12.4* 12.1* 10.6* 10.8* 11.3*  HCT 36.6* 35.6* 31.5* 31.4* 32.9*  MCV 85.7 86.0 86.8 84.9 84.1  PLT 311 305 269 310 388   Basic Metabolic Panel: Recent Labs  Lab 09/27/24 2132  09/28/24 0434 09/29/24 0402 09/30/24 0418 10/01/24 0505  NA 130* 131* 131* 127* 129*  K 3.7 4.0 3.9 4.0 3.8  CL 98 103 98 98 101  CO2 25 21* 22 21* 22  GLUCOSE 125* 108* 100* 157* 120*  BUN 10 12 16 20  29*  CREATININE 1.39* 1.27* 1.06 1.03 1.11  CALCIUM  8.3* 8.0* 7.7* 7.7* 7.8*  MG 1.9  --   --   --   --    GFR: Estimated Creatinine Clearance: 64.7 mL/min (by C-G formula based on SCr of 1.11 mg/dL). Liver Function Tests: Recent Labs  Lab 09/27/24 2132 09/28/24 1219 09/29/24 0402 09/30/24 0418 10/01/24 0505  AST 69* 63* 38 25 21  ALT 102* 99* 80* 68* 58*  ALKPHOS 213* 218* 168* 146* 121  BILITOT 3.9* 2.1* 1.4* 1.2 0.8  PROT 6.0* 6.0* 5.6* 5.9* 6.1*  ALBUMIN 2.8* 2.7* 2.3* 2.5* 2.6*   Recent Labs  Lab 09/27/24 2132  LIPASE 146*   No results for input(s): AMMONIA in the last 168 hours. Coagulation Profile: Recent Labs  Lab 09/27/24 2132  INR 1.5*   Cardiac Enzymes: No results for input(s): CKTOTAL, CKMB, CKMBINDEX, TROPONINI in the last 168 hours. BNP (last 3 results) No results for input(s): PROBNP in the last 8760 hours. HbA1C: No results for input(s): HGBA1C in the last 72 hours. CBG: No results for input(s):  GLUCAP in the last 168 hours. Lipid Profile: No results for input(s): CHOL, HDL, LDLCALC, TRIG, CHOLHDL, LDLDIRECT in the last 72 hours. Thyroid  Function Tests: No results for input(s): TSH, T4TOTAL, FREET4, T3FREE, THYROIDAB in the last 72 hours.  Anemia Panel: No results for input(s): VITAMINB12, FOLATE, FERRITIN, TIBC, IRON, RETICCTPCT in the last 72 hours. Urine analysis:    Component Value Date/Time   COLORURINE AMBER (A) 09/30/2024 0850   APPEARANCEUR CLEAR 09/30/2024 0850   LABSPEC 1.024 09/30/2024 0850   PHURINE 5.0 09/30/2024 0850   GLUCOSEU NEGATIVE 09/30/2024 0850   HGBUR NEGATIVE 09/30/2024 0850   BILIRUBINUR NEGATIVE 09/30/2024 0850   KETONESUR 5 (A) 09/30/2024 0850   PROTEINUR  NEGATIVE 09/30/2024 0850   NITRITE NEGATIVE 09/30/2024 0850   LEUKOCYTESUR NEGATIVE 09/30/2024 0850   Sepsis Labs: @LABRCNTIP (procalcitonin:4,lacticidven:4)  ) Recent Results (from the past 240 hours)  Surgical pcr screen     Status: None   Collection Time: 09/22/24  8:58 AM   Specimen: Nasal Mucosa; Nasal Swab  Result Value Ref Range Status   MRSA, PCR NEGATIVE NEGATIVE Final   Staphylococcus aureus NEGATIVE NEGATIVE Final    Comment: (NOTE) The Xpert SA Assay (FDA approved for NASAL specimens in patients 70 years of age and older), is one component of a comprehensive surveillance program. It is not intended to diagnose infection nor to guide or monitor treatment. Performed at [facility], [address] [place], [place] [address]   Blood culture (routine x 2)     Status: None (Preliminary result)   Collection Time: 09/27/24 10:09 PM   Specimen: BLOOD RIGHT ARM  Result Value Ref Range Status   Specimen Description BLOOD RIGHT ARM  Final   Special Requests   Final    BOTTLES DRAWN AEROBIC AND ANAEROBIC Blood Culture adequate volume   Culture   Final    NO GROWTH 4 DAYS Performed at [facility], [address], [place], [place] [address]    Report Status PENDING  Incomplete  Blood culture (routine x 2)     Status: None (Preliminary result)   Collection Time: 09/27/24 10:09 PM   Specimen: BLOOD RIGHT HAND  Result Value Ref Range Status   Specimen Description BLOOD RIGHT HAND  Final   Special Requests   Final    BOTTLES DRAWN AEROBIC AND ANAEROBIC Blood Culture adequate volume   Culture   Final    NO GROWTH 4 DAYS Performed at [facility], [address] [place], [place] [address]    Report Status PENDING  Incomplete  Resp panel by RT-PCR (RSV, Flu A&B, Covid) Anterior Nasal Swab     Status: None   Collection Time: 09/27/24 10:21 PM   Specimen: Anterior Nasal Swab  Result Value Ref Range Status   SARS Coronavirus 2 by RT PCR NEGATIVE NEGATIVE  Final   Influenza A by PCR NEGATIVE NEGATIVE Final   Influenza B by PCR NEGATIVE NEGATIVE Final    Comment: (NOTE) The Xpert Xpress SARS-CoV-2/FLU/RSV plus assay is intended as an aid in the diagnosis of influenza from Nasopharyngeal swab specimens and should not be used as a sole basis for treatment. Nasal washings and aspirates are unacceptable for Xpert Xpress SARS-CoV-2/FLU/RSV testing.  Fact Sheet for Patients: bloggercourse.com  Fact Sheet for Healthcare Providers: seriousbroker.it  This test is not yet approved or cleared by the [place]  FDA and has been authorized for detection and/or diagnosis of SARS-CoV-2 by FDA under an Emergency Use Authorization (EUA).  This EUA will remain in effect (meaning this test can be used) for the duration of the COVID-19 declaration under Section 564(b)(1) of the Act, 21 U.S.C. section 360bbb-3(b)(1), unless the authorization is terminated or revoked.     Resp Syncytial Virus by PCR NEGATIVE NEGATIVE Final    Comment: (NOTE) Fact Sheet for Patients: bloggercourse.com  Fact Sheet for Healthcare Providers: seriousbroker.it  This test is not yet approved or cleared by the [place]  FDA and has been authorized for detection and/or diagnosis of SARS-CoV-2 by FDA under an Emergency Use Authorization (EUA). This EUA will remain in effect (meaning this test can be used) for the duration of the COVID-19 declaration under Section 564(b)(1) of the Act, 21 U.S.C. section 360bbb-3(b)(1), unless the authorization is terminated or revoked.  Performed at [facility], [address] [place], [place] [address]       Studies: DG CHEST PORT 1 VIEW Result Date: 09/28/2024 CLINICAL DATA:  AFib. EXAM: PORTABLE CHEST 1 VIEW COMPARISON:  CT abdomen/pelvis dated 09/27/2024. Chest radiograph 09/22/2019. FINDINGS: Low lung volumes.  Cardiomegaly. Redemonstrated small layering right pleural effusion with fluid in the minor fissure. Left lung appears clear. No pneumothorax. No acute osseous abnormality. IMPRESSION: 1. Redemonstrated small layering right pleural effusion. 2. Cardiomegaly. Electronically Signed   By: Harrietta Sherry M.D.   On: 09/28/2024 11:21   CT ABDOMEN PELVIS W CONTRAST Addendum Date: 09/27/2024 ADDENDUM: Upon further review and discussion with Dr. Teresa, there are two gallstones both measuring 6 mm in the distal common bile duct at the ampulla of Vater ( series 6 image 66). Likely a 3rd gallstone within the duodenum measuring 6 mm ( 6 / 68 ). Mild intrahepatic biliary dilation. Electronically signed by: Norman Gatlin MD 09/27/2024 11:45 PM EST RP Workstation: [id number]   Result Date: 09/27/2024 ORIGINAL REPORT EXAM: CT ABDOMEN AND PELVIS WITH CONTRAST 09/27/2024 10:59:59 PM TECHNIQUE: CT of the abdomen and pelvis was performed with the administration of intravenous contrast. 75 mL iohexol  (OMNIPAQUE ) 350 MG/ML injection was administered. Multiplanar reformatted images are provided for review. Automated exposure control, iterative reconstruction, and/or weight-based adjustment of the mA/kV was utilized to reduce the radiation dose to as low as reasonably achievable. COMPARISON: None available. CLINICAL HISTORY: Abdominal pain, post-op. FINDINGS: LOWER CHEST: Round consolidation in the right lower lobe. Associated moderate right pleural effusion. LIVER: The liver is unremarkable. GALLBLADDER AND BILE DUCTS: Interval change of cholecystectomy. There is a 6.7 x 2.1 cm fluid collection in the gallbladder fossa. Adjacent fat stranding and fluid tracking inferiorly in the pericolic gutter. SPLEEN: No acute abnormality. PANCREAS: No acute abnormality. ADRENAL GLANDS: No acute abnormality. KIDNEYS, URETERS AND BLADDER: No stones in the kidneys or ureters. No hydronephrosis. No perinephric or periureteral stranding. Urinary  bladder is unremarkable. GI AND BOWEL: Stomach demonstrates no acute abnormality. There is no bowel obstruction. Moderate to large stool burden in the right colon. Wall thickening and hyper-enhancement of the 2nd and 3rd portions of the duodenum. PERITONEUM AND RETROPERITONEUM: No ascites. No free air. Fluid tracking inferiorly in the pericolic gutter. VASCULATURE: Aorta is normal in caliber. LYMPH NODES: No lymphadenopathy. REPRODUCTIVE ORGANS: Enlarged prostate. BONES AND SOFT TISSUES: No acute osseous abnormality. Small subcutaneous nodules and loculated gas in the anterior subcutaneous fat likely due to injections. Moderate right and small left fat-containing inguinal hernias. IMPRESSION: 1. Interval change of cholecystectomy with a 6.7 x 2.1 cm fluid collection in the gallbladder fossa, adjacent fat stranding, and fluid tracking inferiorly in the pericolic gutter. Findings  may be postoperative however are concerning for developing abscess. If there is concern for biliary leak, HIDA scan is recommended for further evaluation. 2. Reactive duodenitis. 3. Moderate right pleural effusion and atelectasis or pneumonia in the right lower lobe. 4. Moderate to large colonic stool burden. 5. Enlarged prostate. Electronically signed by: Norman Gatlin MD 09/27/2024 11:18 PM EST RP Workstation: [id number]    Scheduled Meds:  Chlorhexidine  Gluconate Cloth  6 each Topical Daily   furosemide   80 mg Intravenous BID   levothyroxine   25 mcg Oral QAC breakfast   metoprolol  succinate  37.5 mg Oral Daily   polyethylene glycol  17 g Oral Daily   rosuvastatin   5 mg Oral QODAY   senna-docusate  1 tablet Oral BID   sodium chloride  flush  10-40 mL Intracatheter Q12H   sodium chloride  flush  10-40 mL Intracatheter Q12H    Continuous Infusions:  amiodarone  30 mg/hr (10/01/24 1151)   ceFEPime  (MAXIPIME ) IV 2 g (10/01/24 1335)   heparin  2,100 Units/hr (10/01/24 1104)   metronidazole  500 mg (10/01/24 0935)   vancomycin   1,500 mg (10/01/24 9061)     LOS: 3 days     Lebron JINNY Cage, MD [facility]  If 7PM-7AM, please contact night-coverage [email or web address] 10/01/2024, 2:54 PM

## 2024-10-01 NOTE — Progress Notes (Signed)
 PT Cancellation Note  Patient Details Name: BRAVE DACK MRN: [id number] DOB: 1941-09-23   Cancelled Treatment:    Reason Eval/Treat Not Completed: Returned for additional session since PT Evaluation limited this morning by need for stat IV; pt currently getting PICC line placed. Will follow up as schedule permits.  Kalese Ensz, PT, DPT Acute Rehabilitation Services  Personal: Secure Chat Rehab Office: [phone]  Darice LITTIE Almas 10/01/2024, 1:18 PM

## 2024-10-01 NOTE — Evaluation (Signed)
 Physical Therapy Evaluation Patient Details Name: Lance Hubbard MRN: [id number] DOB: [birth year] Today's Date: 10/01/2024  History of Present Illness  83 y.o. male admitted 09/27/24 with worsening abdominal pain. Abdominal CT showing multiple gallstones, cholecystectomy changes concerning for abscess vs biliary leak; potential ileus. S/p ERCP 12/5. Course complicated by afib. Of note, s/p lap chole 11/28 for gallstone pancreatitis. Other PMH includes CHF, HTN, CML, afib.   Clinical Impression  Pt presents with an overall decrease in functional mobility secondary to above. PTA, pt mod indep with SPC for community ambulation, lives with wife, enjoys staying active. Today, pt mod indep bed mobility, able to stand with RW, though ambulation limited by arrival of IV team for stat IV. Pt limited by c/o fatigue, weakness, SOB. Pt would benefit from continued acute PT services to maximize functional mobility and independence prior to d/c home.       If plan is discharge home, recommend the following: A little help with bathing/dressing/bathroom;Assistance with cooking/housework;Assist for transportation;Help with stairs or ramp for entrance   Can travel by private vehicle    Yes    Equipment Recommendations  (TBD potential rollator)  Recommendations for Other Services       Functional Status Assessment Patient has had a recent decline in their functional status and demonstrates the ability to make significant improvements in function in a reasonable and predictable amount of time.     Precautions / Restrictions Precautions Precautions: Fall;Other (comment) Precaution/Restrictions Comments: watch HR; abdominal prec for comfort Restrictions Weight Bearing Restrictions Per Provider Order: No      Mobility  Bed Mobility Overal bed mobility: Modified Independent Bed Mobility: Rolling, Sidelying to Sit, Sit to Supine Rolling: Modified independent (Device/Increase time), Used rails Sidelying to  sit: Modified independent (Device/Increase time), HOB elevated, Used rails   Sit to supine: Modified independent (Device/Increase time)   General bed mobility comments: requires encouragement to perform task as indep as possible, ultimately able to go supine<>sit mod indep with rail use, increased effort    Transfers Overall transfer level: Needs assistance Equipment used: Rolling walker (2 wheels) Transfers: Sit to/from Stand Sit to Stand: Supervision           General transfer comment: mod indep lateral scoot towards [facility]    Ambulation/Gait               General Gait Details: pt declined secondary to IV team arrival  Stairs            Wheelchair Mobility     Tilt Bed    Modified Rankin (Stroke Patients Only)       Balance Overall balance assessment: Needs assistance Sitting-balance support: No upper extremity supported, Feet supported Sitting balance-Leahy Scale: Good                                       Pertinent Vitals/Pain Pain Assessment Pain Assessment: No/denies pain Faces Pain Scale: Hurts little more Pain Location: stomach/constipation Pain Descriptors / Indicators: Discomfort Pain Intervention(s): Monitored during session, Limited activity within patient's tolerance    Home Living Family/patient expects to be discharged to:: Private residence Living Arrangements: Spouse/significant other Available Help at Discharge: Family;Available 24 hours/day Type of Home: House Home Access: Stairs to enter   [employer] of Steps: 3-4   Home Layout: One level Home Equipment: Cane - single point;Cane - Programmer, [employer] (2 wheels)      Prior  Function Prior Level of Function : Independent/Modified Independent;Driving             Mobility Comments: indep without DME, uses SPC for community mobility; enjoys water aerobic classes ADLs Comments: Indep with ADLs, IADLs     Extremity/Trunk Assessment   Upper  Extremity Assessment Upper Extremity Assessment: Generalized weakness    Lower Extremity Assessment Lower Extremity Assessment: Generalized weakness    Cervical / Trunk Assessment Cervical / Trunk Assessment: Normal  Communication   Communication Communication: Impaired Factors Affecting Communication: Hearing impaired    Cognition Arousal: Alert Behavior During Therapy: WFL for tasks assessed/performed   PT - Cognitive impairments: No apparent impairments                         Following commands: Intact       Cueing Cueing Techniques: Verbal cues     General Comments General comments (skin integrity, edema, etc.): wife present. educ pt and wife re: role of acute PT, POC, activity recommendations, importance of mobility, potential d/c needs including DME use. session limited by arrival of IV team for stat IV and pt needing to be supine    Exercises     Assessment/Plan    PT Assessment Patient needs continued PT services  PT Problem List Decreased strength;Decreased activity tolerance;Decreased balance;Decreased mobility       PT Treatment Interventions DME instruction;Gait training;Stair training;Functional mobility training;Therapeutic activities;Therapeutic exercise;Balance training;Patient/family education    PT Goals (Current goals can be found in the Care Plan section)  Acute Rehab PT Goals Patient Stated Goal: regain strength PT Goal Formulation: With patient Time For Goal Achievement: 10/15/24 Potential to Achieve Goals: Good    Frequency Min 2X/week     Co-evaluation               AM-PAC PT 6 Clicks Mobility  Outcome Measure Help needed turning from your back to your side while in a flat bed without using bedrails?: None Help needed moving from lying on your back to sitting on the side of a flat bed without using bedrails?: None Help needed moving to and from a bed to a chair (including a wheelchair)?: A Little Help needed  standing up from a chair using your arms (e.g., wheelchair or bedside chair)?: A Little Help needed to walk in hospital room?: A Little Help needed climbing 3-5 steps with a railing? : A Little 6 Click Score: 20    End of Session   Activity Tolerance: Patient limited by fatigue Patient left: in bed;with call bell/phone within reach;with bed alarm set;with family/visitor present;with nursing/sitter in room Nurse Communication: Mobility status PT Visit Diagnosis: Other abnormalities of gait and mobility (R26.89)    Time: 8969-8957 PT Time Calculation (min) (ACUTE ONLY): 12 min   Charges:   PT Evaluation $PT Eval Low Complexity: 1 Low   PT General Charges $$ ACUTE PT VISIT: 1 Visit       Darice Almas, PT, DPT Acute Rehabilitation Services  Personal: Secure Chat Rehab Office: [phone]  Darice LITTIE Almas 10/01/2024, 1:05 PM

## 2024-10-01 NOTE — Progress Notes (Signed)
 Peripherally Inserted Central Catheter Placement  The IV Nurse has discussed with the patient and/or persons authorized to consent for the patient, the purpose of this procedure and the potential benefits and risks involved with this procedure.  The benefits include less needle sticks, lab draws from the catheter, and the patient may be discharged home with the catheter. Risks include, but not limited to, infection, bleeding, blood clot (thrombus formation), and puncture of an artery; nerve damage and irregular heartbeat and possibility to perform a PICC exchange if needed/ordered by physician.  Alternatives to this procedure were also discussed.  Bard Power PICC patient education guide, fact sheet on infection prevention and patient information card has been provided to patient /or left at bedside.    PICC Placement Documentation  PICC Triple Lumen 10/01/24 Left Basilic 46 cm 0 cm (Active)  Indication for Insertion or Continuance of Line Vasoactive infusions 10/01/24 1300  Exposed Catheter (cm) 0 cm 10/01/24 1300  Site Assessment Clean, Dry, Intact 10/01/24 1300  Lumen #1 Status Flushed;Saline locked;Blood return noted 10/01/24 1300  Lumen #2 Status Flushed;Saline locked;Blood return noted 10/01/24 1300  Lumen #3 Status Flushed;Saline locked;Blood return noted 10/01/24 1300  Dressing Type Transparent;Securing device 10/01/24 1300  Dressing Status Antimicrobial disc/dressing in place;Clean, Dry, Intact 10/01/24 1300  Line Care Connections checked and tightened 10/01/24 1300  Line Adjustment (NICU/IV Team Only) No 10/01/24 1300  Dressing Intervention New dressing;Adhesive placed at insertion site (IV team only) 10/01/24 1300  Dressing Change Due 10/08/24 10/01/24 1300       Ethyl Priestly Renee 10/01/2024, 1:10 PM

## 2024-10-01 NOTE — Progress Notes (Signed)
 PHARMACY - ANTICOAGULATION CONSULT NOTE  Pharmacy Consult for Heparin  Indication: atrial fibrillation  Allergies  Allergen Reactions   Amoxicillin Hives and Other (See Comments)    REACTION: whelps/pain in knees and ankles   Penicillin G     Other reaction(s): hives    Patient Measurements: Height: 6' 2 (188 cm) Weight: 99.8 kg (220 lb 0.3 oz) IBW/kg (Calculated) : 82.2 HEPARIN  DW (KG): 99.8  Vital Signs: Temp: 96.9 F (36.1 C) (12/07 0826) Temp Source: Oral (12/07 0826) BP: 120/97 (12/07 0940) Pulse Rate: 95 (12/07 0940)  Labs: Recent Labs    09/28/24 1219 09/29/24 0402 09/29/24 0402 09/30/24 0418 09/30/24 1050 09/30/24 2151 10/01/24 0505 10/01/24 0832  HGB  --  10.6*   < > 10.8*  --   --  11.3*  --   HCT  --  31.5*  --  31.4*  --   --  32.9*  --   PLT  --  269  --  310  --   --  388  --   APTT 50*  --   --   --  56* 54*  --  99*  HEPARINUNFRC >1.10*  --   --   --  0.76*  --  0.62  --   CREATININE  --  1.06  --  1.03  --   --  1.11  --    < > = values in this interval not displayed.    Estimated Creatinine Clearance: 64.7 mL/min (by C-G formula based on SCr of 1.11 mg/dL).  Assessment: 83 yoM on apixaban  PTA for afib, last dose 12/3 AM. Apixaban  held for ERCP.  Pharmacy consulted for heparin .  Planning to use aPTTs for heparin  monitoring while heparin  levels are expected to be falsely high from recent apixaban  doses.  Heparin  drip held for ERCP on 12/5 and resumed ~3am on 12/6 per GI. aPTT is now therapeutic (99 seconds) and heparin  level is also therapeutic (0.62) on 2100 units/hr. Now appear to be correlating. CBC stable.   Goal of Therapy:  Heparin  level 0.3-0.7 units/ml aPTT 66-102 seconds Monitor platelets by anticoagulation protocol: Yes   Plan:  Continue heparin  drip at 2100 units/hr. Daily aPTT and heparin  level for one more day to confirm correlating; daily CBC. Follow up for resuming apixaban  when able.  Genaro Zebedee Calin,  RPh 10/01/2024,9:59 AM

## 2024-10-01 NOTE — Evaluation (Signed)
 Occupational Therapy Evaluation Patient Details Name: Lance Hubbard MRN: [id number] DOB: 1941/10/15 Today's Date: 10/01/2024   History of Present Illness   Pt is an 83 y/o male presenting with abdominal pain. CT abdomen showed multiple gallstones, changes of cholecystectomy with fluid collection. Findings concerning for abscess vs biliary leak. ERCP 12/5. Hospitalization complicated by a fib. Recent laparoscopic cholecystectomy on 11/28. PMH: CHF, HTN, CML, hypothyroidism, afib.     Clinical Impressions PTA, pt lives with spouse, typically Modified Independent with ADLs, IADLs and mobility using cane for community mobility. Pt presents now with deficits in standing balance, strength and endurance. Pt received on BSC, able to manage transfer back to bed using RW with CGA; declined further mobility attempts at this time. Pt requires Min-Mod A for ADL completion. Based on current presentation, anticipate continued progress with OOB activity to DC home with HHOT.  HR 110s-120s     If plan is discharge home, recommend the following:   A little help with bathing/dressing/bathroom;Assistance with cooking/housework;Help with stairs or ramp for entrance     Functional Status Assessment   Patient has had a recent decline in their functional status and demonstrates the ability to make significant improvements in function in a reasonable and predictable amount of time.     Equipment Recommendations   Other (comment) (RW)     Recommendations for Other Services         Precautions/Restrictions   Precautions Precautions: Fall Precaution/Restrictions Comments: watch HR Restrictions Weight Bearing Restrictions Per Provider Order: No     Mobility Bed Mobility Overal bed mobility: Needs Assistance Bed Mobility: Sit to Supine       Sit to supine: Min assist   General bed mobility comments: on BSC on entry. MIn A for BLE back to bed    Transfers Overall transfer level:  Needs assistance Equipment used: Rolling walker (2 wheels) Transfers: Bed to chair/wheelchair/BSC, Sit to/from Stand Sit to Stand: Contact guard assist     Step pivot transfers: Contact guard assist     General transfer comment: cues to keep inside RW at all times during transfer for increased stability      Balance Overall balance assessment: Needs assistance Sitting-balance support: No upper extremity supported, Feet supported Sitting balance-Leahy Scale: Fair     Standing balance support: Bilateral upper extremity supported, During functional activity Standing balance-Leahy Scale: Poor                             ADL either performed or assessed with clinical judgement   ADL Overall ADL's : Needs assistance/impaired Eating/Feeding: Independent   Grooming: Set up;Sitting   Upper Body Bathing: Minimal assistance;Sitting   Lower Body Bathing: Minimal assistance;Sitting/lateral leans;Sit to/from stand   Upper Body Dressing : Set up;Sitting   Lower Body Dressing: Moderate assistance;Sitting/lateral leans;Sit to/from stand Lower Body Dressing Details (indicate cue type and reason): unable to flex trunk to reach feet d/t bloated. unable to cross LE. educated that pt may require assist for this tasks initially at home Toilet Transfer: Contact guard assist;Stand-pivot;BSC/3in1;Rolling walker (2 wheels) Toilet Transfer Details (indicate cue type and reason): cues to push from armrests of BSC with pt able to manage and pivot with CGA using RW Toileting- Clothing Manipulation and Hygiene: Moderate assistance;Sit to/from stand;Sitting/lateral lean Toileting - Clothing Manipulation Details (indicate cue type and reason): RN assisting with hygiene       General ADL Comments: Pt reports feeling weaker and limited by  HR though HR 110s-120s throughout session. Pt declined further ADL assessments and mobility, opting for back to bed transfer.     Vision Ability to See in  Adequate Light: 0 Adequate Patient Visual Report: No change from baseline Vision Assessment?: No apparent visual deficits     Perception         Praxis         Pertinent Vitals/Pain Pain Assessment Pain Assessment: Faces Faces Pain Scale: Hurts a little bit Pain Location: stomach Pain Descriptors / Indicators: Grimacing Pain Intervention(s): Monitored during session     Extremity/Trunk Assessment Upper Extremity Assessment Upper Extremity Assessment: Generalized weakness;Right hand dominant   Lower Extremity Assessment Lower Extremity Assessment: Defer to PT evaluation   Cervical / Trunk Assessment Cervical / Trunk Assessment: Normal   Communication Communication Communication: Impaired Factors Affecting Communication: Hearing impaired   Cognition Arousal: Alert Behavior During Therapy: WFL for tasks assessed/performed Cognition: No apparent impairments                               Following commands: Intact       Cueing  General Comments   Cueing Techniques: Verbal cues;Gestural cues  Wife present initially, expresses frustration over medical mgmt   Exercises     Shoulder Instructions      Home Living Family/patient expects to be discharged to:: Private residence Living Arrangements: Spouse/significant other Available Help at Discharge: Family Type of Home: House Home Access: Stairs to enter Secretary/administrator of Steps: 3-4   Home Layout: One level (+1 step from hallway/kitchen)     Bathroom Shower/Tub: Walk-in shower         Home Equipment: [place] - single point;Cane - quad          Prior Functioning/Environment Prior Level of Function : Independent/Modified Independent;Driving             Mobility Comments: cane outside of the home for mobility, does water aerobic classes ADLs Comments: Indep with ADLs, IADLs    OT Problem List: Decreased strength;Decreased activity tolerance;Impaired balance (sitting and/or  standing);Decreased knowledge of use of DME or AE   OT Treatment/Interventions: Self-care/ADL training;Therapeutic exercise;Energy conservation;DME and/or AE instruction;Therapeutic activities;Patient/family education;Balance training      OT Goals(Current goals can be found in the care plan section)   Acute Rehab OT Goals Patient Stated Goal: see how i feel when i go home OT Goal Formulation: With patient Time For Goal Achievement: 10/15/24 Potential to Achieve Goals: Good ADL Goals Pt Will Perform Lower Body Bathing: with set-up;sit to/from stand;sitting/lateral leans Pt Will Perform Lower Body Dressing: with set-up;sit to/from stand;sitting/lateral leans Pt Will Transfer to Toilet: with supervision;ambulating   OT Frequency:  Min 2X/week    Co-evaluation              AM-PAC OT 6 Clicks Daily Activity     Outcome Measure Help from another person eating meals?: None Help from another person taking care of personal grooming?: A Little Help from another person toileting, which includes using toliet, bedpan, or urinal?: A Lot Help from another person bathing (including washing, rinsing, drying)?: A Little Help from another person to put on and taking off regular upper body clothing?: A Little Help from another person to put on and taking off regular lower body clothing?: A Lot 6 Click Score: 17   End of Session Equipment Utilized During Treatment: Rolling walker (2 wheels);Gait belt Nurse Communication: Mobility status  Activity  Tolerance: Patient limited by fatigue Patient left: in bed;with call bell/phone within reach;with bed alarm set  OT Visit Diagnosis: Unsteadiness on feet (R26.81);Other abnormalities of gait and mobility (R26.89);Muscle weakness (generalized) (M62.81)                Time: 9277-9251 OT Time Calculation (min): 26 min Charges:  OT General Charges $OT Visit: 1 Visit OT Evaluation $OT Eval Low Complexity: 1 Low OT Treatments $Self Care/Home  Management : 8-22 mins  Mliss NOVAK, OTR/L Acute Rehab Services Office: [phone]   Mliss Fish 10/01/2024, 8:11 AM

## 2024-10-01 NOTE — Progress Notes (Signed)
 2 Days Post-Op   Subjective/Chief Complaint: Complains of shortness of breath   Objective: Vital signs in last 24 hours: Temp:  [96.9 F (36.1 C)-97.6 F (36.4 C)] 96.9 F (36.1 C) (12/07 0826) Pulse Rate:  [92-103] 92 (12/07 0826) Resp:  [18] 18 (12/07 0826) BP: (100-123)/(70-86) 114/70 (12/07 0826) SpO2:  [93 %-99 %] 93 % (12/07 0826) Last BM Date : 09/30/24  Intake/Output from previous day: 12/06 0701 - 12/07 0700 In: 3115.1 [P.O.:720; I.V.:1499.9; IV Piggyback:895.2] Out: 1800 [Urine:1800] Intake/Output this shift: No intake/output data recorded.  General appearance: alert and cooperative Resp: clear to auscultation bilaterally Cardio: irregularly irregular rhythm GI: soft, minimal tenderness. Good bs  Lab Results:  Recent Labs    09/30/24 0418 10/01/24 0505  WBC 9.7 15.0*  HGB 10.8* 11.3*  HCT 31.4* 32.9*  PLT 310 388   BMET Recent Labs    09/30/24 0418 10/01/24 0505  NA 127* 129*  K 4.0 3.8  CL 98 101  CO2 21* 22  GLUCOSE 157* 120*  BUN 20 29*  CREATININE 1.03 1.11  CALCIUM  7.7* 7.8*   PT/INR No results for input(s): LABPROT, INR in the last 72 hours. ABG No results for input(s): PHART, HCO3 in the last 72 hours.  Invalid input(s): PCO2, PO2  Studies/Results: DG ERCP Result Date: 09/30/2024 EXAM: 19 SPOT IMAGES FROM AN ERCP 09/29/2024 05:20:00 PM COMPARISON: None Available. CLINICAL HISTORY: 461500 Elective surgery 461500 Elective surgery 461500 FLUOROSCOPY DOSE AND TYPE: Fluoroscopy time: 10 minutes 47 seconds Radiation Dose Index: Reference Air Kerma (in mGy) = 279.72 FINDINGS: Endoscopy and cannulation of the major papilla was performed by the gastroenterology service. Spot views are presented for interpretation. These images demonstrate endoscopic retrograde cannulation of the ampulla and subsequent selective cannulation of both the terminal Pancreatic Duct and terminal Common Bile Duct with retrograde contrast injection. No  pancreatic divisum. The Pancreatic Duct is nondilated and no intraluminal filling defect is identified. Subsequent images demonstrate guidewire placement and internal biliary stenting of the terminal pancreatic duct with good evacuation of injected contrast. Subsequent retrograde cannulation, guidewire placement, and balloon sweep of the Common Bile Duct. No intra or extrahepatic Biliary Ductal Dilation. There is suspected extravasation of contrast in the region of the Cystic Duct Stump in keeping with a Biliary Leak. Subsequently imaged demonstrates internal biliary stenting with good evacuation of contrast from the Biliary Tree. IMPRESSION: 1. Suspected biliary leak at the cystic duct stump. 2. Internal stenting of the common bile duct and pancreatic duct with good evacuation of contrast. NOTE: Please refer to the procedure report for further details. Electronically signed by: Dorethia Molt MD 09/30/2024 04:32 AM EST RP Workstation: [id number]   DG CHEST PORT 1 VIEW Result Date: 09/30/2024 EXAM: 1 VIEW(S) XRAY OF THE CHEST 09/30/2024 04:08:11 AM COMPARISON: 09/28/2024 CLINICAL HISTORY: Acute dyspnea FINDINGS: LUNGS AND PLEURA: Central pulmonary vascular engorgement without superimposed overt pulmonary edema. Asymmetric opacification of the right lung base likely relates to a posterior layering small to moderate right pleural effusion. Small left pleural effusion noted. No pneumothorax. HEART AND MEDIASTINUM: Cardiomegaly. No acute abnormality of the mediastinal silhouette. BONES AND SOFT TISSUES: No acute osseous abnormality. IMPRESSION: 1. Asymmetric opacification of the right lung base, likely related to a posterior layering small to moderate right pleural effusion. 2. Small left pleural effusion. 3. Cardiomegaly and central pulmonary vascular engorgement without superimposed overt pulmonary edema. Electronically signed by: Dorethia Molt MD 09/30/2024 04:26 AM EST RP Workstation: [id number]   DG C-Arm 1-60  Min-No Report Result  Date: 09/29/2024 Fluoroscopy was utilized by the requesting physician.  No radiographic interpretation.   DG C-Arm 1-60 Min-No Report Result Date: 09/29/2024 Fluoroscopy was utilized by the requesting physician.  No radiographic interpretation.   DG C-Arm 1-60 Min-No Report Result Date: 09/29/2024 Fluoroscopy was utilized by the requesting physician.  No radiographic interpretation.    Anti-infectives: Anti-infectives (From admission, onward)    Start     Dose/Rate Route Frequency Ordered Stop   09/28/24 1200  ceFEPIme  (MAXIPIME ) 2 g in sodium chloride  0.9 % 100 mL IVPB        2 g 200 mL/hr over 30 Minutes Intravenous Every 12 hours 09/28/24 0810     09/28/24 0815  metroNIDAZOLE  (FLAGYL ) IVPB 500 mg        500 mg 100 mL/hr over 60 Minutes Intravenous Every 12 hours 09/28/24 0803     09/28/24 0815  vancomycin  (VANCOREADY) IVPB 1500 mg/300 mL        1,500 mg 150 mL/hr over 120 Minutes Intravenous Every 24 hours 09/28/24 0810     09/28/24 0015  ceFEPIme  (MAXIPIME ) 2 g in sodium chloride  0.9 % 100 mL IVPB        2 g 200 mL/hr over 30 Minutes Intravenous  Once 09/28/24 0005 09/28/24 0148   09/28/24 0015  metroNIDAZOLE  (FLAGYL ) IVPB 500 mg        500 mg 100 mL/hr over 60 Minutes Intravenous  Once 09/28/24 0005 09/28/24 0249       Assessment/Plan: s/p Procedure(s): ERCP, WITH INTERVENTION IF INDICATED (N/A) Wbc elevated at 15K. Will get CT chest, abd, pelvis to rule out biloma, pleural effusion, or source of wbc ERCP was successful in clearing duct Small bile leak was noted. Will ask IR if he may need drain although fluid collection from 3 days ago was likely too small to drain May have ileus. Will monitor for return of bowel function Biliary pancreatitis   POD 9, s/p laparoscopic cholecystectomy  Choledocholithiasis - CT 12/3 with 6.7x2.1 cm collection in GB fossa may be post-op but adjacent fat stranding and fluid tracking inferiorly, possible abscess vs  biloma, reactive duodenitis, choledocholithiasis, moderate to large stool burden - WBC 15 today, AF - LFTs downtrending today - GI for ERCP  - ERCP did show bile leak present, consider IR drainage. CT chest, abd, pel today - surgical team will follow  -abx per primary teams   FEN: NPO for ERCP, IVF per TRH VTE: SCDs, hep gtt ID: [email or web address] /vanc/flagyl    - per [facility] -  A. Fib with RVR - cardiology follwing HTN CML Osteoarthritis   LOS: 3 days    Lance Hubbard 10/01/2024

## 2024-10-01 NOTE — Progress Notes (Addendum)
 Progress Note  Patient Name: Lance Hubbard Date of Encounter: 10/01/2024 [facility] Cardiologist: None   Interval Summary   Patient underwent EGD finding stone on cholangiogram and choledocholithiasis with complete removal by biliary stent durotomy.  A bile leak was also found.  Status post pancreatic stent and common bile duct stent.  Plan is for ERCP to remove stent in 2 months.  Denies any chest pain  Had shortness of breath all night and had to sit up in chair.  O2 sats currently 93% on room air but had been 99% overnight   Vital Signs Vitals:   09/30/24 1606 09/30/24 2156 10/01/24 0013 10/01/24 0439  BP: 100/85 115/79  123/86  Pulse: 92 100  (!) 103  Resp: 18 18 18 18   Temp: (!) 97.3 F (36.3 C) 97.6 F (36.4 C) (!) 97.5 F (36.4 C) (!) 97.4 F (36.3 C)  TempSrc: Oral Oral Oral Oral  SpO2: 98% 99%  99%  Weight:      Height:        Intake/Output Summary (Last 24 hours) at 10/01/2024 9177 Last data filed at 10/01/2024 0641 Gross per 24 hour  Intake 3115.08 ml  Output 1800 ml  Net 1315.08 ml      09/29/2024    2:25 PM 09/27/2024    9:47 PM 09/22/2024   10:53 AM  Last 3 Weights  Weight (lbs) 220 lb 0.3 oz 220 lb 220 lb  Weight (kg) 99.8 kg 99.791 kg 99.791 kg      Telemetry/ECG  Atrial fibrillation with frequent PVCs and 4 beat runs of NSVT versus Ashman's phenomenon heart rate in the 90s to low 100s- Personally Reviewed  Physical Exam  GEN: Well nourished, well developed in no acute distress HEENT: Normal NECK: No JVD; No carotid bruits LYMPHATICS: No lymphadenopathy CARDIAC: Irregularly irregular, no murmurs, rubs, gallops RESPIRATORY:  Clear to auscultation without rales, wheezing or rhonchi  ABDOMEN: Soft, non-tender, non-distended MUSCULOSKELETAL:  No edema; No deformity  SKIN: Warm and dry NEUROLOGIC:  Alert and oriented x 3 PSYCHIATRIC:  Normal affect   Assessment & Plan  Lance Hubbard is a 83 y.o. male with a hx of HTN, HFrEF (EF  30-35% 08/2024), hypothyroidism, CML, HLD, tobacco use, AF s/p PVI 05/2021 who is being seen for atrial fibrillation with RVR.  Atrial fibrillation with RVR - Status post remote A-fib ablation 06/14/2021 and cardioversion 01/13/2024  - he has not had any recurrence of A-fib until recently when he was hospitalized with gallstone pancreatitis - EKG prior to discharge in November showed sinus rhythm with PACs - He said he felt onset of palpitations  and knew he was back in A-fib - Suspect that recent acute illness (A-fib last month in the setting of gallstone pancreatitis and now likely sepsis from intra-abdominal source with CT also concerning for right lower lobe pneumonia and abscess after recent cholecystectomy) has triggered his A-fib with RVR - Currently on IV Amio at 30 mg/h - Heart rate currently in the 90s to low 100s  - Transitioned yesterday to Toprol  XL 25 mg daily>> will increase to 37.5 mg daily for better heart rate control - Continue IV Amio for now until acute illness has improved heart rate should improve with treatment of sepsis - He was on Eliquis  at home for CHA2DS2-VASc score of 3 which has been held for procedures - Continue IV heparin  drip and transition back to DOAC once no further procedures needed   Acute on chronic HFrEF Dilated  cardiomyopathy Pulmonary hypertension Hypertension - 2D echo 09/14/2024 showed EF 30 to 35% with global HK, moderate to severe pulmonary hypertension - PTA Entresto  on hold due to acute illness and soft blood pressure - Chest x-ray 12/6 shows small to moderate right pleural effusion/small left pleural effusion and central pulmonary vascular engorgement with no frank pulmonary edema; O2 saturations 98 % on  Room air  - BNP 12/4 was 494 - Suspect poorly controlled atrial fibrillation is contributing to CHF - Urine has been very dark for several days: UA negative for bilirubin or RBCs - SCr 1.03, NA 127 (down from 131); K+ 4 - With Na trending  downward, chest x-ray and exam consistent with volume overload he was started on Lasix  40 mg IV twice daily yesterday - UOP: 1.8 L yesterday but still net +7 L since admission; NA improved to 129; K+ 3.8; SCr 1.1 - Complaining of increased shortness of breath and had to sit in a chair last night although lungs actually sound fairly clear  - Repeat chest x-ray today and BNP - Increase Lasix  to 80 mg IV twice daily - Follow strict I's and O's, daily weights and renal function while diuresing - Increasing Toprol  XL to 37.5 mg daily for better heart rate control - Will not restart GDMT for now as we need adequate blood pressure to titrate heart rate controlling drugs - Once he is over his acute illness we can get him on adequate GDMT as BP tolerates   Abdominal abscess Recent pancreatitis status post cholecystectomy Sepsis - Status post laparoscopic cholecystectomy 09/22/2024 for gallstone pancreatitis - Now back with increased abdominal pain and leukocytosis - CT with possible abscess near cholecystectomy site - Continue IV antibiotics per [facility] and surgery - ERCP 12/5 showed choledocholithiasis status post biliary stent and pancreatic stent   Coronary artery calcifications Hyperlipidemia -Noted to have coronary artery calcifications on CT 05/14/2021 for A-fib ablation.  Coronary calcium  score was 419 - Denies any anginal chest pain - Crestor  5 mg daily - No ASA due to DOAC use for A-fib   I spent 35 minutes caring for this patient today face to face, ordering and reviewing labs, reviewing records from 2D echo 09/21/2024, chest x-ray 09/30/2024, endoscopy report from 09/29/2024, seeing the patient, documenting in the record  Otherwise management per primary.   She I have been rounder to be this week so I will do rounder be this weekend For questions or updates, please contact [facility] Please consult [email or web address] for contact info under         Signed, Wilbert Bihari, MD

## 2024-10-02 ENCOUNTER — Inpatient Hospital Stay (HOSPITAL_COMMUNITY)

## 2024-10-02 ENCOUNTER — Telehealth: Payer: Self-pay

## 2024-10-02 ENCOUNTER — Encounter (HOSPITAL_COMMUNITY): Payer: Self-pay | Admitting: Gastroenterology

## 2024-10-02 DIAGNOSIS — T85528A Displacement of other gastrointestinal prosthetic devices, implants and grafts, initial encounter: Secondary | ICD-10-CM

## 2024-10-02 DIAGNOSIS — K8050 Calculus of bile duct without cholangitis or cholecystitis without obstruction: Secondary | ICD-10-CM

## 2024-10-02 DIAGNOSIS — K851 Biliary acute pancreatitis without necrosis or infection: Secondary | ICD-10-CM

## 2024-10-02 HISTORY — PX: IR THORACENTESIS ASP PLEURAL SPACE W/IMG GUIDE: IMG5380

## 2024-10-02 LAB — COMPREHENSIVE METABOLIC PANEL WITH GFR
ALT: 47 U/L — ABNORMAL HIGH (ref 0–44)
AST: 22 U/L (ref 15–41)
Albumin: 2.3 g/dL — ABNORMAL LOW (ref 3.5–5.0)
Alkaline Phosphatase: 108 U/L (ref 38–126)
Anion gap: 12 (ref 5–15)
BUN: 30 mg/dL — ABNORMAL HIGH (ref 8–23)
CO2: 21 mmol/L — ABNORMAL LOW (ref 22–32)
Calcium: 7.3 mg/dL — ABNORMAL LOW (ref 8.9–10.3)
Chloride: 95 mmol/L — ABNORMAL LOW (ref 98–111)
Creatinine, Ser: 1.08 mg/dL (ref 0.61–1.24)
GFR, Estimated: >60 mL/min (ref >60)
Glucose, Bld: 165 mg/dL — ABNORMAL HIGH (ref 70–99)
Potassium: 3.6 mmol/L (ref 3.5–5.1)
Sodium: 128 mmol/L — ABNORMAL LOW (ref 135–145)
Total Bilirubin: 1 mg/dL (ref 0.0–1.2)
Total Protein: 5.2 g/dL — ABNORMAL LOW (ref 6.5–8.1)

## 2024-10-02 LAB — CBC WITH DIFFERENTIAL/PLATELET
Abs Immature Granulocytes: 0.11 K/uL — ABNORMAL HIGH (ref 0.00–0.07)
Basophils Absolute: 0 K/uL (ref 0.0–0.1)
Basophils Relative: 0 %
Eosinophils Absolute: 0.1 K/uL (ref 0.0–0.5)
Eosinophils Relative: 1 %
HCT: 28.6 % — ABNORMAL LOW (ref 39.0–52.0)
Hemoglobin: 9.9 g/dL — ABNORMAL LOW (ref 13.0–17.0)
Immature Granulocytes: 1 %
Lymphocytes Relative: 11 %
Lymphs Abs: 1.3 K/uL (ref 0.7–4.0)
MCH: 29.6 pg (ref 26.0–34.0)
MCHC: 34.6 g/dL (ref 30.0–36.0)
MCV: 85.6 fL (ref 80.0–100.0)
Monocytes Absolute: 1.2 K/uL — ABNORMAL HIGH (ref 0.1–1.0)
Monocytes Relative: 11 %
Neutro Abs: 8.9 K/uL — ABNORMAL HIGH (ref 1.7–7.7)
Neutrophils Relative %: 76 %
Platelets: 344 K/uL (ref 150–400)
RBC: 3.34 MIL/uL — ABNORMAL LOW (ref 4.22–5.81)
RDW: 14.5 % (ref 11.5–15.5)
WBC: 11.7 K/uL — ABNORMAL HIGH (ref 4.0–10.5)
nRBC: 0 % (ref 0.0–0.2)

## 2024-10-02 LAB — CULTURE, BLOOD (ROUTINE X 2)
Culture: NO GROWTH
Culture: NO GROWTH
Special Requests: ADEQUATE
Special Requests: ADEQUATE

## 2024-10-02 LAB — APTT
aPTT: >200 s (ref 24–36)
aPTT: 68 s — ABNORMAL HIGH (ref 24–36)

## 2024-10-02 LAB — MAGNESIUM: Magnesium: 1.7 mg/dL (ref 1.7–2.4)

## 2024-10-02 LAB — HEPARIN LEVEL (UNFRACTIONATED)
Heparin Unfractionated: >1.1 [IU]/mL — ABNORMAL HIGH (ref 0.30–0.70)
Heparin Unfractionated: 0.31 [IU]/mL (ref 0.30–0.70)

## 2024-10-02 MED ORDER — LIDOCAINE-EPINEPHRINE 1 %-1:100000 IJ SOLN
INTRAMUSCULAR | Status: AC
  Filled 2024-10-02: qty 1

## 2024-10-02 MED ORDER — LIDOCAINE-EPINEPHRINE 1 %-1:100000 IJ SOLN
20.0000 mL | Freq: Once | INTRAMUSCULAR | Status: AC
  Administered 2024-10-02: 10 mL via INTRADERMAL

## 2024-10-02 MED ORDER — BISACODYL 5 MG PO TBEC
10.0000 mg | DELAYED_RELEASE_TABLET | Freq: Once | ORAL | Status: AC
  Administered 2024-10-02: 10 mg via ORAL
  Filled 2024-10-02: qty 2

## 2024-10-02 MED ORDER — SODIUM CHLORIDE 0.9 % IV SOLN
2.0000 g | Freq: Three times a day (TID) | INTRAVENOUS | Status: DC
  Administered 2024-10-02 – 2024-10-03 (×3): 2 g via INTRAVENOUS
  Filled 2024-10-02 (×3): qty 12.5

## 2024-10-02 NOTE — Progress Notes (Signed)
 Progress Note  Patient Name: Lance Hubbard Date of Encounter: 10/02/2024 [facility] Cardiologist: None   Interval Summary   Patient is feeling improved from a shortness of breath standpoint.  Laying flat in bed no orthopnea or chest pain.  Vital Signs Vitals:   10/02/24 0427 10/02/24 0850 10/02/24 1027 10/02/24 1130  BP: 107/73 121/70 117/78 106/76  Pulse:  (!) 109  99  Resp: 18 15  17   Temp: (!) 97.5 F (36.4 C) 97.6 F (36.4 C)  98.2 F (36.8 C)  TempSrc: Oral Oral  Oral  SpO2:  99% 100% 99%  Weight:      Height:        Intake/Output Summary (Last 24 hours) at 10/02/2024 1228 Last data filed at 10/02/2024 0945 Gross per 24 hour  Intake 1901.72 ml  Output 1750 ml  Net 151.72 ml      09/29/2024    2:25 PM 09/27/2024    9:47 PM 09/22/2024   10:53 AM  Last 3 Weights  Weight (lbs) 220 lb 0.3 oz 220 lb 220 lb  Weight (kg) 99.8 kg 99.791 kg 99.791 kg      Telemetry/ECG  Atrial fibrillation heart rate around 105-110- Personally Reviewed  Physical Exam  GEN: No acute distress.   Neck: No JVD Cardiac: IRRR, no murmurs, rubs, or gallops.  Respiratory: Diminished breath sounds on the left side  GI: Soft, nontender, non-distended  MS: Mild edema right side   Patient Profile Patient with past medical history significant for atrial fibrillation status post PVI 05/2021, hypothyroidism, hypertension, CML, hyperlipidemia, tobacco use.  Patient admitted here for sepsis secondary to biliary pancreatitis status post cholecystectomy patient cardiology following for recurrence of atrial fibrillation and HFrEF.  Assessment & Plan   A-fib RVR -Ablation 05/2021, 12/2023 DCCV -NSR 08/2024 Patient here with recurrence of atrial fibrillation in the setting of sepsis.  Heart rates borderline controlled around 105-110 for the most part. Continue with IV heparin  until all of his procedures have been completed and anemia has stabilized. Continue IV heparin , Toprol -XL 37.5 mg,  amiodarone  drip Eventually will likely need DCCV likely outpatient to let him recover from his acute illness although rates have been difficult to control here.  Would need TEE and at least 2 doses of DOAC if he fails rate control here. TSH normal here. Needs to follow-up with EP, may consider repeat ablation.  Acute HFrEF (new) -09/2019 EF normal New onset HFrEF likely related to A-fib RVR and sepsis.  EF 30 to 35% with global hypokinesis.  Normal RV.  Severely elevated RVSP 65.1, moderately dilated LA.  No significant valvular disease.  Shortness of breath has improved, he is mildly volume up but with moderate right and small left side lower effusions that may require thoracentesis.  Diuresing on IV Lasix  80 mg twice daily.  -3.2 L last 24 hours. Continue with IV Lasix  80 mg twice daily GDMT limited by hypotension.  PTA Entresto  held.  On Toprol -XL 37.5 mg daily.  Titrate GDMT more after diuresis and once he stabilizes more. Need to repeat echocardiogram after DCCV. Reassess pulmonary hypertension once he is euvolemic and out of atrial fibrillation, likely some element of hypervolemia.  Pleural effusion General Surgery is going to consult IR to consider thoracentesis.  Coronary calcifications -04/2021 CAC score 419. No anginal complaints. No aspirin  with DOAC.  Continue rosuvastatin  5 mg daily.  Biliary pancreatitis Status post laparoscopic cholecystectomy 09/22/2024.  CT of the abdomen and pelvis suggesting abscess versus biliary leak.  General surgery does not think this will need IR drainage.  Anemia Downtrending hemoglobin, did report 1 dark stool.  Continue to closely monitor for any other signs of bleeding and remain on IV heparin  drip for now.   For questions or updates, please contact [facility] Please consult [email or web address] for contact info under       Signed, Lance LITTIE Nanas, MD     Patient seen and examined.  Agree with above documentation.  On exam,  patient is alert and oriented, irregular rhythm, tachycardic no murmurs, diminished breath sounds, trace edema, + JVD.  Net -1.1 L.  Creatinine stable at 1.1.  Underwent thoracentesis today with 1.4 L removed.  Continue IV Lasix .  Continue Toprol -XL and amiodarone  drip for rate control.  Will likely need DCCV at some point, but will need to hold off until ensure no further surgical issues  Lance LITTIE Nanas, MD

## 2024-10-02 NOTE — Telephone Encounter (Signed)
-----   Message from [facility] sent at 09/30/2024  6:19 AM EST ----- Regarding: Follow-up Lance Hubbard, This patient needs ERCP follow-up for biliary stent removal in 2 months. This patient needs a KUB follow-up in 2 weeks for PD stent evaluation. Thanks. GM

## 2024-10-02 NOTE — Progress Notes (Signed)
 3 Days Post-Op   Subjective/Chief Complaint: Cc remains SOB, which he says is improved compared to yesterday. Worse with movement/mobility. Remains on room air with normal work of breathing.  Reports one watery BM that was black after enema yesterday. States he stills feels pressure like he needs to have a BM. Passing some flatus but not much. Denies nausea/vomiting.  Afebrile, HR 90's, BP 107 systolic On room air WBC 11.7 from 15 Hgb 10.8 > 11.3 > 9.9  LFTs unremarkable  Objective: Vital signs in last 24 hours: Temp:  [96.9 F (36.1 C)-98 F (36.7 C)] 97.5 F (36.4 C) (12/08 0427) Pulse Rate:  [92-103] 98 (12/07 1609) Resp:  [16-18] 18 (12/08 0427) BP: (107-120)/(69-97) 107/73 (12/08 0427) SpO2:  [92 %-100 %] 100 % (12/07 1609) Last BM Date : 10/01/24  Intake/Output from previous day: 12/07 0701 - 12/08 0700 In: 2141.7 [P.O.:960; I.V.:760.7; IV Piggyback:421] Out: 3250 [Urine:3250] Intake/Output this shift: No intake/output data recorded.  General appearance: alert and cooperative Resp: normal effort ORA, clear to auscultation bilaterally anteriorly Cardio: irregularly irregular rhythm HR 108 bpm GI: soft, overall non-tender, abdominal wall ecchymosis no periitonitis Lab Results:  Recent Labs    10/01/24 0505 10/02/24 0545  WBC 15.0* 11.7*  HGB 11.3* 9.9*  HCT 32.9* 28.6*  PLT 388 344   BMET Recent Labs    10/01/24 0505 10/02/24 0545  NA 129* 128*  K 3.8 3.6  CL 101 95*  CO2 22 21*  GLUCOSE 120* 165*  BUN 29* 30*  CREATININE 1.11 1.08  CALCIUM  7.8* 7.3*   PT/INR No results for input(s): LABPROT, INR in the last 72 hours. ABG No results for input(s): PHART, HCO3 in the last 72 hours.  Invalid input(s): PCO2, PO2  Studies/Results: CT CHEST ABDOMEN PELVIS WO CONTRAST Result Date: 10/01/2024 EXAM: CT CHEST, ABDOMEN AND PELVIS WITHOUT CONTRAST 10/01/2024 03:55:06 PM TECHNIQUE: CT of the chest, abdomen and pelvis was performed without the  administration of intravenous contrast. Multiplanar reformatted images are provided for review. Automated exposure control, iterative reconstruction, and/or weight based adjustment of the mA/kV was utilized to reduce the radiation dose to as low as reasonably achievable. COMPARISON: Prior examination of 10/24/2024. CLINICAL HISTORY: rule out biloma rule out biloma FINDINGS: CHEST: MEDIASTINUM AND LYMPH NODES: Extensive multivessel coronary artery calcifications. Global cardiac size within normal limits. No pericardial effusion. The central airways are clear. Central pulmonary arteries are of normal caliber. Mild atherosclerotic calcification within the thoracic aorta. No aortic aneurysm. Left upper extremity PICC line tip seen within the superior vena cava. No mediastinal, hilar or axillary lymphadenopathy. LUNGS AND PLEURA: Moderate right and small left pleural effusions are present with associated bibasilar compressed atelectasis. No superimposed confluent pulmonary infiltrate. No pneumothorax. No central obstructing lesion. ABDOMEN AND PELVIS: LIVER: Since the prior examination of 10/24/2024, internal stents are seen within the distal common bile duct and pancreatic duct extending into the second portion of the duodenum through the ampulla. Previously noted calcified gallstones within the distal common bile duct have been evacuated. Loculated fluid collection within the gallbladder fossa has decreased measuring 2.2 x 5.4 cm. Infiltrative changes within the right upper quadrant which may represent a combination of postsurgical change and bowel related to an underlying biliary leak is again noted. Small right subdiaphragmatic fluid is again identified and appears stable. A simple cyst within the left hepatic lobe. Mild pneumobilia is present in keeping with biliary stenting. The liver is otherwise unremarkable in this noncontrast examination. GALLBLADDER AND BILE DUCTS: Internal stents  are seen within the distal  common bile duct and pancreatic duct extending into the second portion of the duodenum through the ampulla. Previously noted calcified gallstones within the distal common bile duct have been evacuated. Loculated fluid collection within the gallbladder fossa has decreased measuring 2.2 x 5.4 cm. Infiltrative changes within the right upper quadrant which may represent a combination of postsurgical change and bowel related to an underlying biliary leak is again noted. Small right subdiaphragmatic fluid is again identified and appears stable. Mild pneumobilia is present in keeping with biliary stenting. No biliary ductal dilatation. SPLEEN: No acute abnormality. PANCREAS: Internal stents are seen within the distal common bile duct and pancreatic duct extending into the second portion of the duodenum through the ampulla. The pancreas is otherwise unremarkable. ADRENAL GLANDS: No acute abnormality. KIDNEYS, URETERS AND BLADDER: Mild bilateral nonspecific perinephric stranding is unchanged. The kidneys are otherwise unremarkable. No stones in the kidneys or ureters. No hydronephrosis. Marked prostatic hypertrophy. The bladder is not distended. GI AND BOWEL: Stomach demonstrates no acute abnormality. There is no bowel obstruction. REPRODUCTIVE ORGANS: Marked prostatic hypertrophy. PERITONEUM AND RETROPERITONEUM: Mild ascites again noted. No free air. VASCULATURE: Aorta is normal in caliber. Extensive aortoiliac atherosclerotic calcification. No aortic aneurysm. ABDOMINAL AND PELVIS LYMPH NODES: No lymphadenopathy. REPRODUCTIVE ORGANS: Marked prostatic hypertrophy. BONES AND SOFT TISSUES: Moderate right fat-containing inguinal hernia is unchanged. Progressive subcutaneous edema within the body wall in keeping with progressive anasarca. Osseous structures are age-appropriate. No acute bone abnormality. No lytic or blastic bone lesion. IMPRESSION: 1. Moderate right and small left pleural effusions with associated bibasilar  compressed atelectasis. Development of mild ascites and progressive body wall subcutaneous edema in keeping with progressive anasarca. 2. Status post cholecystectomy. Loculated fluid collection within the gallbladder fossa has decreased in size, measuring 2.2 x 5.4 cm. 3. Internal stenting of the procedure or location at the interval the distal common bile duct and pancreatic duct extending into the second portion of the duodenum through the ampulla, with interval evacuation of previously noted calcified gallstones from the distal common bile duct. 4. Right upper quadrant infiltrative changes again noted, reflecting either postsurgical change or inflammatory change related to biliary leak identified on endoscopic examination of September 29, 2024. . 5. Extensive multivessel coronary artery calcifications. 6. Moderate right fat-containing inguinal hernia, unchanged. 7. Marked prostatic hypertrophy. Electronically signed by: Dorethia Molt MD 10/01/2024 08:18 PM EST RP Workstation: [id number]   DG Chest Port 1 View Result Date: 10/01/2024 EXAM: 1 VIEW(S) XRAY OF THE CHEST 10/01/2024 01:26:26 PM COMPARISON: Yesterday. CLINICAL HISTORY: Status post peripherally inserted central catheter (PICC) central line placement. FINDINGS: LINES, TUBES AND DEVICES: Peripherally inserted central catheter (PICC) central line is in appropriate position. LUNGS AND PLEURA: Minimal right basilar subsegmental atelectasis is noted. Small right pleural effusion. No pneumothorax. HEART AND MEDIASTINUM: Stable cardiomediastinal silhouette. BONES AND SOFT TISSUES: No acute osseous abnormality. IMPRESSION: 1. Small right pleural effusion with minimal right basilar subsegmental atelectasis. Electronically signed by: Lynwood Seip MD 10/01/2024 01:48 PM EST RP Workstation: [id number]   US  EKG SITE RITE Result Date: 10/01/2024 If Site Rite image not attached, placement could not be confirmed due to current cardiac  rhythm.   Anti-infectives: Anti-infectives (From admission, onward)    Start     Dose/Rate Route Frequency Ordered Stop   09/28/24 1200  ceFEPIme  (MAXIPIME ) 2 g in sodium chloride  0.9 % 100 mL IVPB        2 g 200 mL/hr over 30 Minutes Intravenous Every 12 hours 09/28/24  9189     09/28/24 0815  metroNIDAZOLE  (FLAGYL ) IVPB 500 mg        500 mg 100 mL/hr over 60 Minutes Intravenous Every 12 hours 09/28/24 0803     09/28/24 0815  vancomycin  (VANCOREADY) IVPB 1500 mg/300 mL        1,500 mg 150 mL/hr over 120 Minutes Intravenous Every 24 hours 09/28/24 0810     09/28/24 0015  ceFEPIme  (MAXIPIME ) 2 g in sodium chloride  0.9 % 100 mL IVPB        2 g 200 mL/hr over 30 Minutes Intravenous  Once 09/28/24 0005 09/28/24 0148   09/28/24 0015  metroNIDAZOLE  (FLAGYL ) IVPB 500 mg        500 mg 100 mL/hr over 60 Minutes Intravenous  Once 09/28/24 0005 09/28/24 0249       Assessment/Plan: s/p Procedure(s): ERCP, WITH INTERVENTION IF INDICATED (N/A) Wbc elevated at 15K. Will get CT chest, abd, pelvis to rule out biloma, pleural effusion, or source of wbc ERCP was successful in clearing duct Small bile leak was noted. Will ask IR if he may need drain although fluid collection from 3 days ago was likely too small to drain May have ileus. Will monitor for return of bowel function Biliary pancreatitis   POD 10, s/p laparoscopic cholecystectomy  Choledocholithiasis - CT 12/3 with 6.7x2.1 cm collection in GB fossa may be post-op but adjacent fat stranding and fluid tracking inferiorly, possible abscess vs biloma, reactive duodenitis, choledocholithiasis, moderate to large stool burden - ERCP 12/5 did show bile leak present - CT C/A/P yesterday 12/7 with moderate right and small left pleural effusions. Interval decrease in RUQ fluid collection (2x5 cm). - given cc of SOB, will ask IR for right thoracentesis for symptomatic relief - given reported history of black watery stool and hgb slightly  downtrending will need to monitor for additional evidence of UGIB. I think we can continue heparin  and monitor for now. If further black stools, hold heparin . He did have difficult ERCP with sphincterotomy so could have some blood loss from this.  - surgical team will follow  - abx per primary teams   FEN: soft diet, IVF per TRH VTE: SCDs, hep gtt ID: cefepime /vanc/flagyl    - per TR[facility] -  A. Fib with RVR - cardiology follwing HTN CML Osteoarthritis   LOS: 4 days    Almarie GORMAN Pringle PA-C 10/02/2024

## 2024-10-02 NOTE — Progress Notes (Signed)
 PHARMACY - ANTICOAGULATION CONSULT NOTE  Pharmacy Consult for Heparin  Indication: atrial fibrillation  Allergies  Allergen Reactions   Amoxicillin Hives and Other (See Comments)    REACTION: whelps/pain in knees and ankles   Penicillin G     Other reaction(s): hives    Patient Measurements: Height: 6' 2 (188 cm) Weight: 99.8 kg (220 lb 0.3 oz) IBW/kg (Calculated) : 82.2 HEPARIN  DW (KG): 99.8  Vital Signs: Temp: 97.6 F (36.4 C) (12/08 0850) Temp Source: Oral (12/08 0850) BP: 121/70 (12/08 0850) Pulse Rate: 109 (12/08 0850)  Labs: Recent Labs    09/30/24 0418 09/30/24 1050 10/01/24 0505 10/01/24 0832 10/02/24 0545 10/02/24 0849  HGB 10.8*  --  11.3*  --  9.9*  --   HCT 31.4*  --  32.9*  --  28.6*  --   PLT 310  --  388  --  344  --   APTT  --    < >  --  99* >200* 68*  HEPARINUNFRC  --    < > 0.62  --  >1.10* 0.31  CREATININE 1.03  --  1.11  --  1.08  --    < > = values in this interval not displayed.    Estimated Creatinine Clearance: 66.5 mL/min (by C-G formula based on SCr of 1.08 mg/dL).  Assessment: 35 yoM on apixaban  PTA for afib, last dose 12/3 AM. Apixaban  held for ERCP.  Pharmacy consulted for heparin .  Planning to use aPTTs for heparin  monitoring while heparin  levels are expected to be falsely high from recent apixaban  doses.  aPTT and heparin  level both therapeutic and correlating. CBC ok. Reports of possible dark stool overnight, continuing heparin  per surgery.  Goal of Therapy:  Heparin  level 0.3-0.7 units/ml aPTT 66-102 seconds Monitor platelets by anticoagulation protocol: Yes   Plan:  Continue heparin  drip at 2100 units/hr Daily heparin  level and CBC  Ozell Jamaica, PharmD, BCPS, [facility] Clinical Pharmacist [phone] Please check AMION for all [facility] Pharmacy numbers 10/02/2024

## 2024-10-02 NOTE — Progress Notes (Signed)
 PHARMACY NOTE:  ANTIMICROBIAL RENAL DOSAGE ADJUSTMENT  Current antimicrobial regimen includes a mismatch between antimicrobial dosage and estimated renal function.  As per policy approved by the Pharmacy & Therapeutics and Medical Executive Committees, the antimicrobial dosage will be adjusted accordingly.  Current antimicrobial dosage:  cefepime  q12h  Indication: IAI  Renal Function:  Estimated Creatinine Clearance: 66.5 mL/min (by C-G formula based on SCr of 1.08 mg/dL). []      On intermittent HD, scheduled: []      On CRRT    Antimicrobial dosage has been changed to:  cefepime  q8h  Additional comments:   Thank you for allowing pharmacy to be a part of this patient's care.  Ozell ONEIDA Jamaica, [facility] 10/02/2024 9:50 AM

## 2024-10-02 NOTE — Plan of Care (Signed)

## 2024-10-02 NOTE — Progress Notes (Signed)
 Progress Note  Patient Name: Lance Hubbard Date of Encounter: 10/02/2024 [facility] Cardiologist: None   Interval Summary   Patient is feeling improved from a shortness of breath standpoint.  Laying flat in bed no orthopnea or chest pain.  Vital Signs Vitals:   10/01/24 1128 10/01/24 1609 10/01/24 2045 10/02/24 0427  BP: 116/69 113/85 (!) 109/93 107/73  Pulse: (!) 103 98    Resp: 16 16 18 18   Temp: 98 F (36.7 C) (!) 97 F (36.1 C) 97.6 F (36.4 C) (!) 97.5 F (36.4 C)  TempSrc: Oral Oral Oral Oral  SpO2: 92% 100%    Weight:      Height:        Intake/Output Summary (Last 24 hours) at 10/02/2024 0747 Last data filed at 10/02/2024 0425 Gross per 24 hour  Intake 2141.72 ml  Output 3250 ml  Net -1108.28 ml      09/29/2024    2:25 PM 09/27/2024    9:47 PM 09/22/2024   10:53 AM  Last 3 Weights  Weight (lbs) 220 lb 0.3 oz 220 lb 220 lb  Weight (kg) 99.8 kg 99.791 kg 99.791 kg      Telemetry/ECG  Atrial fibrillation heart rate around 105-110- Personally Reviewed  Physical Exam  GEN: No acute distress.   Neck: No JVD Cardiac: IRRR, no murmurs, rubs, or gallops.  Respiratory: Diminished breath sounds on the left side  GI: Soft, nontender, non-distended  MS: Mild edema right side   Patient Profile Patient with past medical history significant for atrial fibrillation status post PVI 05/2021, hypothyroidism, hypertension, CML, hyperlipidemia, tobacco use.  Patient admitted here for sepsis secondary to biliary pancreatitis status post cholecystectomy patient cardiology following for recurrence of atrial fibrillation and HFrEF.  Assessment & Plan   A-fib RVR -Ablation 05/2021, 12/2023 DCCV -NSR 08/2024 Patient here with recurrence of atrial fibrillation in the setting of sepsis.  Heart rates borderline controlled around 105-110 for the most part. Continue with IV heparin  until all of his procedures have been completed and anemia has stabilized. Continue IV  heparin , Toprol -XL 37.5 mg. Eventually will likely need DCCV likely outpatient to let him recover from his acute illness although rates have been difficult to control here.  Would need TEE and at least 2 doses of DOAC if he fails rate control here. TSH normal here. Needs to follow-up with EP, may consider repeat ablation.  Acute HFrEF (new) -09/2019 EF normal New onset HFrEF likely related to A-fib RVR and sepsis.  EF 30 to 35% with global hypokinesis.  Normal RV.  Severely elevated RVSP 65.1, moderately dilated LA.  No significant valvular disease.  Shortness of breath has improved, he is mildly volume up but with moderate right and small left side lower effusions that may require thoracentesis.  Diuresing on IV Lasix  80 mg twice daily.  -3.2 L last 24 hours. Continue with IV Lasix  80 mg twice daily GDMT limited by hypotension.  PTA Entresto  held.  On Toprol -XL 37.5 mg daily.  Titrate GDMT more after diuresis and once he stabilizes more. Need to repeat echocardiogram after DCCV. Reassess pulmonary hypertension once he is euvolemic and out of atrial fibrillation, likely some element of hypervolemia.  Pleural effusion General Surgery is going to consult IR to consider thoracentesis.  Coronary calcifications -04/2021 CAC score 419. No anginal complaints. No aspirin  with DOAC.  Continue rosuvastatin  5 mg daily.  Biliary pancreatitis Status post laparoscopic cholecystectomy 09/22/2024.  CT of the abdomen and pelvis suggesting abscess  versus biliary leak.  General surgery does not think this will need IR drainage.  Anemia Downtrending hemoglobin, did report 1 dark stool.  Continue to closely monitor for any other signs of bleeding and remain on IV heparin  drip for now.   For questions or updates, please contact [facility] Please consult [email or web address] for contact info under       Signed, Thom LITTIE Sluder, PA-C

## 2024-10-02 NOTE — TOC Progression Note (Signed)
 Transition of Care [facility]) - Progression Note    Patient Details  Name: Lance Hubbard MRN: [id number] Date of Birth: Oct 03, 1941  Transition of Care [facility]) CM/SW Contact  Graves-Bigelow, Erminio Deems, RN Phone Number: 10/02/2024, 11:38 AM  Clinical Narrative: ICM discussed home health services with the patient and spouse. Patient receives water walking aerobics at [facility] and wants to continues those services. ICM did discuss DME rolling walker with seat and the family wants to think about DME before ordering. ICM did make the patient and spouse aware that if they need HH PT in the future to call the PCP for orders. ICM will continue to follow.   Patient has declined DME rollator. ICM will continue to follow for additional disposition needs.   Expected Discharge Plan: Home/Self Care Barriers to Discharge: Continued Medical Work up  Expected Discharge Plan and Services In-house Referral: NA Discharge Planning Services: CM Consult   Living arrangements for the past 2 months: Single Family Home                   DME Agency: NA       HH Arranged: NA    Social Drivers of Health (SDOH) Interventions SDOH Screenings   Food Insecurity: No Food Insecurity (09/28/2024)  Housing: Low Risk  (09/28/2024)  Transportation Needs: No Transportation Needs (09/28/2024)  Utilities: Not At Risk (09/28/2024)  Social Connections: Socially Integrated (09/28/2024)  Tobacco Use: Medium Risk (09/29/2024)    Readmission Risk Interventions    09/28/2024    3:55 PM  Readmission Risk Prevention Plan  Transportation Screening Complete  HRI or Home Care Consult Complete  Social Work Consult for Recovery Care Planning/Counseling Complete  Palliative Care Screening Not Applicable  Medication Review Oceanographer) Referral to Pharmacy

## 2024-10-02 NOTE — Telephone Encounter (Signed)
 KUB order has been entered   Pt needs ERCP in Feb.

## 2024-10-02 NOTE — Progress Notes (Signed)
 PROGRESS NOTE  Lance Hubbard FMW:[id number] DOB: 1941-04-20 DOA: 09/27/2024 PCP: Frederik Charleston, MD  HPI/Recap of past 24 hours: Lance Hubbard is a 83 y.o. male with medical history significant for hypertension, chronic atrial fibrillation, CML and osteoarthritis, who presented to the ER with acute onset of severe upper abdominal pain with associated hypotension. Pt is s/p laparoscopic cholecystectomy on 11/28, now reporting subsequent severe epigastric pain radiating to his right lateral abdomen. In the ED, BP was 118/77, HR 171, RR 26. Labs revealed lipase 146 AST 69 ALT 102, total bili of 3.9 up from 1.4, WBC 16.8. EKG showed atrial fibrillation with RVR, PVCs and low voltage QRS.  CT abdomen pelvis showed 2 stones in the distal CBD, with third gallstone within the duodenum all measuring about 6 mm, noted interval change of cholecystectomy with a 6.7 x 2.1 cm fluid collection in the gallbladder fossa, adjacent fat stranding, and fluid tracking inferiorly in the pericolic gutter. Findings may be postoperative however are concerning for developing abscess vs concern for biliary leak, HIDA scan is recommended for further evaluation. Moderate right pleural effusion and atelectasis or pneumonia in the right lower lobe, moderate to large colonic stool burden.  Cardiology, GI and general surgery consulted.  Patient admitted for further management.     Today, pt reports SOB has improved.  Had BM after tapwater enema, still feels like he needs to have more BM.  Noted an episode of liquidy dark stool.    Assessment/Plan: Principal Problem:   Atrial fibrillation with RVR (HCC) Active Problems:   Elevated LFTs   CML (chronic myelocytic leukemia) (HCC)   Acute on chronic HFrEF (heart failure with reduced ejection fraction) (HCC)   Dyslipidemia   Hypothyroidism   Family history of CML (chronic monocytic leukemia)   Coronary artery calcification seen on CAT scan   Pure hypercholesterolemia   RUQ  pain   Dilated cardiomyopathy (HCC)   Pulmonary hypertension, unspecified (HCC)   Biloma following surgery   Bile leak   Sepsis likely choledocholithiasis, biliary leak Elevated LFTs- improving Intra-abdominal fluid collection On presentation, noted to be tachycardic, tachypneic, hypotension with leukocytosis Currently afebrile, resolving leukocytosis BC x 2 NGTD Repeat CT abdomen/pelvis/chest on 12/7 showed development of mild ascites and progressive body wall subcutaneous edema in keeping with progressive anasarca. S/p cholecystectomy. Loculated fluid collection within the gallbladder fossa has decreased in size, measuring 2.2 x 5.4 cm. No stone in CBD. GI consulted, s/p ERCP on 09/29/2024 showed choledocholithiasis with biliary sphincterotomy, s/p stent in both pancreatic and common bile duct.  Noted biliary leak.  Repeat ERCP to remove stents in 2 months General surgery on board, appreciate recs IR consulted for possible percutaneous drain placement, recommended drain not warranted at this time, if concern or signs of clinical deterioration, recommend repeat CT AP with contrast for additional evaluation, if worsening fluid collection, reconsult IR Continue IV cefepime , Flagyl , d/c vancomycin  on 12/8 Pain management Monitor closely  Moderate R pleural effusion  Noted on CT chest on 12/7 Gen surg plan to consult IR for possible thoracentesis Continue IV Lasix  as per cardiology  AKI Hyponatremia- chronic Creatinine baseline around 0.9, on admission 1.39 Daily BMP  Chronic atrial fibrillation with RVR Heart rate with better control  Cardiology consulted, on IV amiodarone  drip, heparin , continue Toprol  Telemetry  Anemia of chronic disease/normocytic anemia Hemoglobin baseline around 11-12, dropped to 9.9 on 12/8 Noted an episode of dark liquid stool on 12/7 Monitor closely for melena/hematochezia while on IV heparin  Daily  CBC, repeat today if further episodes of dark stools    Acute on chronic systolic CHF BNP 505, no previous to compare Chest x-ray showed central pulmonary vascular engorgement, small to moderate right pleural effusion Last echo done 08/2024 showed EF of 30 to 35%, global hypokinesis, severe pulmonary hypertension 65.1 mmHg Continue IV Lasix  Hold home Entresto  for now    CML (chronic myelocytic leukemia)  Hold PTA bosutinib    Dyslipidemia Hold statins given elevated LFTs   Hypothyroidism Continue Synthroid  once able      Estimated body mass index is 28.25 kg/m as calculated from the following:   Height as of this encounter: 6' 2 (1.88 m).   Weight as of this encounter: 99.8 kg.     Code Status: Full  Family Communication: Wife at bedside  Disposition Plan: Status is: Inpatient Remains inpatient appropriate because: Level of care      Consultants: Cardiology General Surgery GI  Procedures: ERCP  Antimicrobials: Cefepime  Flagyl    DVT prophylaxis: IV heparin    Objective: Vitals:   10/01/24 1609 10/01/24 2045 10/02/24 0427 10/02/24 0850  BP: 113/85 (!) 109/93 107/73 121/70  Pulse: 98   (!) 109  Resp: 16 18 18 15   Temp: (!) 97 F (36.1 C) 97.6 F (36.4 C) (!) 97.5 F (36.4 C) 97.6 F (36.4 C)  TempSrc: Oral Oral Oral Oral  SpO2: 100%   99%  Weight:      Height:        Intake/Output Summary (Last 24 hours) at 10/02/2024 0954 Last data filed at 10/02/2024 0425 Gross per 24 hour  Intake 1661.72 ml  Output 3250 ml  Net -1588.28 ml   Filed Weights   09/27/24 2147 09/29/24 1425  Weight: 99.8 kg 99.8 kg    Exam: General: NAD Cardiovascular: S1, S2 present Respiratory: Diminished breath sounds worse on the RLL Abdomen: Soft, nontender, nondistended, bowel sounds present Musculoskeletal: No bilateral pedal edema noted Skin: Normal Psychiatry: Normal mood    Data Reviewed: CBC: Recent Labs  Lab 09/27/24 2132 09/28/24 0434 09/29/24 0402 09/30/24 0418 10/01/24 0505 10/02/24 0545  WBC  16.8* 14.8* 10.7* 9.7 15.0* 11.7*  NEUTROABS 14.2*  --  8.2* 9.0* 12.6* 8.9*  HGB 12.4* 12.1* 10.6* 10.8* 11.3* 9.9*  HCT 36.6* 35.6* 31.5* 31.4* 32.9* 28.6*  MCV 85.7 86.0 86.8 84.9 84.1 85.6  PLT 311 305 269 310 388 344   Basic Metabolic Panel: Recent Labs  Lab 09/27/24 2132 09/28/24 0434 09/29/24 0402 09/30/24 0418 10/01/24 0505 10/02/24 0545  NA 130* 131* 131* 127* 129* 128*  K 3.7 4.0 3.9 4.0 3.8 3.6  CL 98 103 98 98 101 95*  CO2 25 21* 22 21* 22 21*  GLUCOSE 125* 108* 100* 157* 120* 165*  BUN 10 12 16 20  29* 30*  CREATININE 1.39* 1.27* 1.06 1.03 1.11 1.08  CALCIUM  8.3* 8.0* 7.7* 7.7* 7.8* 7.3*  MG 1.9  --   --   --   --  1.7   GFR: Estimated Creatinine Clearance: 66.5 mL/min (by C-G formula based on SCr of 1.08 mg/dL). Liver Function Tests: Recent Labs  Lab 09/28/24 1219 09/29/24 0402 09/30/24 0418 10/01/24 0505 10/02/24 0545  AST 63* 38 25 21 22   ALT 99* 80* 68* 58* 47*  ALKPHOS 218* 168* 146* 121 108  BILITOT 2.1* 1.4* 1.2 0.8 1.0  PROT 6.0* 5.6* 5.9* 6.1* 5.2*  ALBUMIN 2.7* 2.3* 2.5* 2.6* 2.3*   Recent Labs  Lab 09/27/24 2132  LIPASE 146*  No results for input(s): AMMONIA in the last 168 hours. Coagulation Profile: Recent Labs  Lab 09/27/24 2132  INR 1.5*   Cardiac Enzymes: No results for input(s): CKTOTAL, CKMB, CKMBINDEX, TROPONINI in the last 168 hours. BNP (last 3 results) No results for input(s): PROBNP in the last 8760 hours. HbA1C: No results for input(s): HGBA1C in the last 72 hours. CBG: No results for input(s): GLUCAP in the last 168 hours. Lipid Profile: No results for input(s): CHOL, HDL, LDLCALC, TRIG, CHOLHDL, LDLDIRECT in the last 72 hours. Thyroid  Function Tests: No results for input(s): TSH, T4TOTAL, FREET4, T3FREE, THYROIDAB in the last 72 hours.  Anemia Panel: No results for input(s): VITAMINB12, FOLATE, FERRITIN, TIBC, IRON, RETICCTPCT in the last 72 hours. Urine  analysis:    Component Value Date/Time   COLORURINE AMBER (A) 09/30/2024 0850   APPEARANCEUR CLEAR 09/30/2024 0850   LABSPEC 1.024 09/30/2024 0850   PHURINE 5.0 09/30/2024 0850   GLUCOSEU NEGATIVE 09/30/2024 0850   HGBUR NEGATIVE 09/30/2024 0850   BILIRUBINUR NEGATIVE 09/30/2024 0850   KETONESUR 5 (A) 09/30/2024 0850   PROTEINUR NEGATIVE 09/30/2024 0850   NITRITE NEGATIVE 09/30/2024 0850   LEUKOCYTESUR NEGATIVE 09/30/2024 0850   Sepsis Labs: @LABRCNTIP (procalcitonin:4,lacticidven:4)  ) Recent Results (from the past 240 hours)  Blood culture (routine x 2)     Status: None   Collection Time: 09/27/24 10:09 PM   Specimen: BLOOD RIGHT ARM  Result Value Ref Range Status   Specimen Description BLOOD RIGHT ARM  Final   Special Requests   Final    BOTTLES DRAWN AEROBIC AND ANAEROBIC Blood Culture adequate volume   Culture   Final    NO GROWTH 5 DAYS Performed at [facility], [address], [place], [place] [address]    Report Status 10/02/2024 FINAL  Final  Blood culture (routine x 2)     Status: None   Collection Time: 09/27/24 10:09 PM   Specimen: BLOOD RIGHT HAND  Result Value Ref Range Status   Specimen Description BLOOD RIGHT HAND  Final   Special Requests   Final    BOTTLES DRAWN AEROBIC AND ANAEROBIC Blood Culture adequate volume   Culture   Final    NO GROWTH 5 DAYS Performed at [facility], [address], [place], [place] [address]    Report Status 10/02/2024 FINAL  Final  Resp panel by RT-PCR (RSV, Flu A&B, Covid) Anterior Nasal Swab     Status: None   Collection Time: 09/27/24 10:21 PM   Specimen: Anterior Nasal Swab  Result Value Ref Range Status   SARS Coronavirus 2 by RT PCR NEGATIVE NEGATIVE Final   Influenza A by PCR NEGATIVE NEGATIVE Final   Influenza B by PCR NEGATIVE NEGATIVE Final    Comment: (NOTE) The Xpert Xpress SARS-CoV-2/FLU/RSV plus assay is intended as an aid in the diagnosis of influenza from Nasopharyngeal swab specimens  and should not be used as a sole basis for treatment. Nasal washings and aspirates are unacceptable for Xpert Xpress SARS-CoV-2/FLU/RSV testing.  Fact Sheet for Patients: bloggercourse.com  Fact Sheet for Healthcare Providers: seriousbroker.it  This test is not yet approved or cleared by the [place]  FDA and has been authorized for detection and/or diagnosis of SARS-CoV-2 by FDA under an Emergency Use Authorization (EUA). This EUA will remain in effect (meaning this test can be used) for the duration of the COVID-19 declaration under Section 564(b)(1) of the Act, 21 U.S.C. section 360bbb-3(b)(1), unless the authorization is terminated or revoked.  Resp Syncytial Virus by PCR NEGATIVE NEGATIVE Final    Comment: (NOTE) Fact Sheet for Patients: bloggercourse.com  Fact Sheet for Healthcare Providers: seriousbroker.it  This test is not yet approved or cleared by the [place]  FDA and has been authorized for detection and/or diagnosis of SARS-CoV-2 by FDA under an Emergency Use Authorization (EUA). This EUA will remain in effect (meaning this test can be used) for the duration of the COVID-19 declaration under Section 564(b)(1) of the Act, 21 U.S.C. section 360bbb-3(b)(1), unless the authorization is terminated or revoked.  Performed at [facility], [address], [place], [place] [address]       Studies: DG CHEST PORT 1 VIEW Result Date: 09/28/2024 CLINICAL DATA:  AFib. EXAM: PORTABLE CHEST 1 VIEW COMPARISON:  CT abdomen/pelvis dated 09/27/2024. Chest radiograph 09/22/2019. FINDINGS: Low lung volumes. Cardiomegaly. Redemonstrated small layering right pleural effusion with fluid in the minor fissure. Left lung appears clear. No pneumothorax. No acute osseous abnormality. IMPRESSION: 1. Redemonstrated small layering right pleural effusion. 2. Cardiomegaly.  Electronically Signed   By: Harrietta Sherry M.D.   On: 09/28/2024 11:21   CT ABDOMEN PELVIS W CONTRAST Addendum Date: 09/27/2024 ADDENDUM: Upon further review and discussion with Dr. Teresa, there are two gallstones both measuring 6 mm in the distal common bile duct at the ampulla of Vater ( series 6 image 66). Likely a 3rd gallstone within the duodenum measuring 6 mm ( 6 / 68 ). Mild intrahepatic biliary dilation. Electronically signed by: Norman Gatlin MD 09/27/2024 11:45 PM EST RP Workstation: [id number]   Result Date: 09/27/2024 ORIGINAL REPORT EXAM: CT ABDOMEN AND PELVIS WITH CONTRAST 09/27/2024 10:59:59 PM TECHNIQUE: CT of the abdomen and pelvis was performed with the administration of intravenous contrast. 75 mL iohexol  (OMNIPAQUE ) 350 MG/ML injection was administered. Multiplanar reformatted images are provided for review. Automated exposure control, iterative reconstruction, and/or weight-based adjustment of the mA/kV was utilized to reduce the radiation dose to as low as reasonably achievable. COMPARISON: None available. CLINICAL HISTORY: Abdominal pain, post-op. FINDINGS: LOWER CHEST: Round consolidation in the right lower lobe. Associated moderate right pleural effusion. LIVER: The liver is unremarkable. GALLBLADDER AND BILE DUCTS: Interval change of cholecystectomy. There is a 6.7 x 2.1 cm fluid collection in the gallbladder fossa. Adjacent fat stranding and fluid tracking inferiorly in the pericolic gutter. SPLEEN: No acute abnormality. PANCREAS: No acute abnormality. ADRENAL GLANDS: No acute abnormality. KIDNEYS, URETERS AND BLADDER: No stones in the kidneys or ureters. No hydronephrosis. No perinephric or periureteral stranding. Urinary bladder is unremarkable. GI AND BOWEL: Stomach demonstrates no acute abnormality. There is no bowel obstruction. Moderate to large stool burden in the right colon. Wall thickening and hyper-enhancement of the 2nd and 3rd portions of the duodenum. PERITONEUM AND  RETROPERITONEUM: No ascites. No free air. Fluid tracking inferiorly in the pericolic gutter. VASCULATURE: Aorta is normal in caliber. LYMPH NODES: No lymphadenopathy. REPRODUCTIVE ORGANS: Enlarged prostate. BONES AND SOFT TISSUES: No acute osseous abnormality. Small subcutaneous nodules and loculated gas in the anterior subcutaneous fat likely due to injections. Moderate right and small left fat-containing inguinal hernias. IMPRESSION: 1. Interval change of cholecystectomy with a 6.7 x 2.1 cm fluid collection in the gallbladder fossa, adjacent fat stranding, and fluid tracking inferiorly in the pericolic gutter. Findings may be postoperative however are concerning for developing abscess. If there is concern for biliary leak, HIDA scan is recommended for further evaluation. 2. Reactive duodenitis. 3. Moderate right pleural effusion and atelectasis or pneumonia in the right lower lobe.  4. Moderate to large colonic stool burden. 5. Enlarged prostate. Electronically signed by: Norman Gatlin MD 09/27/2024 11:18 PM EST RP Workstation: [id number]    Scheduled Meds:  Chlorhexidine  Gluconate Cloth  6 each Topical Daily   furosemide   80 mg Intravenous BID   levothyroxine   25 mcg Oral QAC breakfast   metoprolol  succinate  37.5 mg Oral Daily   polyethylene glycol  17 g Oral Daily   rosuvastatin   5 mg Oral QODAY   senna-docusate  1 tablet Oral BID   sodium chloride  flush  10-40 mL Intracatheter Q12H   sodium chloride  flush  10-40 mL Intracatheter Q12H    Continuous Infusions:  amiodarone  30 mg/hr (10/02/24 0951)   ceFEPime  (MAXIPIME ) IV     heparin  2,100 Units/hr (10/02/24 0425)   metronidazole  Stopped (10/01/24 2323)     LOS: 4 days     Lebron JINNY Cage, MD [facility]  If 7PM-7AM, please contact night-coverage [email or web address] 10/02/2024, 9:54 AM

## 2024-10-02 NOTE — Procedures (Signed)
 PROCEDURE SUMMARY:  Successful image-guided right thoracentesis. Yielded 1.4 L  of hazy amber fluid. Pt tolerated procedure well. No immediate complications. EBL = trace   Specimen was not sent for labs. CXR ordered.  Please see imaging section of Epic for full dictation.  Konstantine Gervasi H Valmai [place] PA-C 10/02/2024 10:29 AM

## 2024-10-02 NOTE — Progress Notes (Signed)
 Physical Therapy Treatment Patient Details Name: Lance Hubbard MRN: [id number] DOB: [birth year] Today's Date: 10/02/2024   History of Present Illness 83 y.o. male admitted 09/27/24 with worsening abdominal pain. Abdominal CT showing multiple gallstones, cholecystectomy changes concerning for abscess vs biliary leak; potential ileus. S/p ERCP 12/5. Course complicated by afib. Of note, s/p lap chole 11/28 for gallstone pancreatitis. Other PMH includes CHF, HTN, CML, afib.    PT Comments  Pt admitted with above diagnosis. Pt was able to progress ambulation with RW with CGA to ambulate with min cues. Has supportive family.  Pt progressing and VSS.  Should progress to home with HHPT f/u.  Pt currently with functional limitations due to the deficits listed below (see PT Problem List). Pt will benefit from acute skilled PT to increase their independence and safety with mobility to allow discharge.       If plan is discharge home, recommend the following: A little help with bathing/dressing/bathroom;Assistance with cooking/housework;Assist for transportation;Help with stairs or ramp for entrance   Can travel by private vehicle        Equipment Recommendations  Rollator (4 wheels)    Recommendations for Other Services       Precautions / Restrictions Precautions Precautions: Fall;Other (comment) Precaution/Restrictions Comments: watch HR; abdominal prec for comfort Restrictions Weight Bearing Restrictions Per Provider Order: No     Mobility  Bed Mobility Overal bed mobility: Modified Independent Bed Mobility: Rolling, Sidelying to Sit, Sit to Supine Rolling: Modified independent (Device/Increase time), Used rails Sidelying to sit: Modified independent (Device/Increase time), HOB elevated, Used rails   Sit to supine: Modified independent (Device/Increase time)   General bed mobility comments: requires encouragement to perform task as indep as possible, ultimately able to go supine<>sit mod  indep with rail use, increased effort    Transfers Overall transfer level: Needs assistance Equipment used: Rolling walker (2 wheels) Transfers: Sit to/from Stand Sit to Stand: Contact guard assist   Step pivot transfers: Contact guard assist       General transfer comment: Cues needed for hand placement and steadying assist to rise.    Ambulation/Gait Ambulation/Gait assistance: Modified independent (Device/Increase time), Contact guard assist Gait Distance (Feet): 20 Feet Assistive device: Rolling walker (2 wheels) Gait Pattern/deviations: Step-through pattern, Decreased stride length, Trunk flexed, Wide base of support, Drifts right/left   Gait velocity interpretation: <1.31 ft/sec, indicative of household ambulator   General Gait Details: Pt only agreeable to walk around bed to the chair as he states he is limited right now in getting too far from bed due to [place] fit. Declined for PT to remove it.  Pt did march in place as well as walk around the bed.  He does seem to fatigue quickly. Needs continued therapy.   Stairs             Wheelchair Mobility     Tilt Bed    Modified Rankin (Stroke Patients Only)       Balance Overall balance assessment: Needs assistance Sitting-balance support: No upper extremity supported, Feet supported Sitting balance-Leahy Scale: Good     Standing balance support: Bilateral upper extremity supported, During functional activity Standing balance-Leahy Scale: Poor Standing balance comment: relies on UE support                            Communication Communication Communication: Impaired Factors Affecting Communication: Hearing impaired  Cognition Arousal: Alert Behavior During Therapy: WFL for tasks assessed/performed  PT - Cognitive impairments: No apparent impairments                         Following commands: Intact      Cueing Cueing Techniques: Verbal cues  Exercises General Exercises -  Lower Extremity Ankle Circles/Pumps: AROM, Both, 10 reps, Seated Hip Flexion/Marching: AROM, Both, 15 reps, Standing    General Comments General comments (skin integrity, edema, etc.): VSS      Pertinent Vitals/Pain Pain Assessment Pain Assessment: No/denies pain    Home Living                          Prior Function            PT Goals (current goals can now be found in the care plan section) Acute Rehab PT Goals Patient Stated Goal: regain strength Progress towards PT goals: Progressing toward goals    Frequency    Min 2X/week      PT Plan      Co-evaluation              AM-PAC PT 6 Clicks Mobility   Outcome Measure  Help needed turning from your back to your side while in a flat bed without using bedrails?: None Help needed moving from lying on your back to sitting on the side of a flat bed without using bedrails?: None Help needed moving to and from a bed to a chair (including a wheelchair)?: A Little Help needed standing up from a chair using your arms (e.g., wheelchair or bedside chair)?: A Little Help needed to walk in hospital room?: A Little Help needed climbing 3-5 steps with a railing? : A Lot 6 Click Score: 19    End of Session Equipment Utilized During Treatment: Gait belt Activity Tolerance: Patient limited by fatigue Patient left: with call bell/phone within reach;in chair;with chair alarm set Nurse Communication: Mobility status PT Visit Diagnosis: Other abnormalities of gait and mobility (R26.89)     Time: 1205-1232 PT Time Calculation (min) (ACUTE ONLY): 27 min  Charges:    $Gait Training: 8-22 mins PT General Charges $$ ACUTE PT VISIT: 1 Visit                     Garold Sheeler M,PT Acute Rehab Services [phone]    Stephane JULIANNA Bevel 10/02/2024, 1:52 PM

## 2024-10-03 DIAGNOSIS — K921 Melena: Secondary | ICD-10-CM

## 2024-10-03 LAB — COMPREHENSIVE METABOLIC PANEL WITH GFR
ALT: 42 U/L (ref 0–44)
AST: 21 U/L (ref 15–41)
Albumin: 2.4 g/dL — ABNORMAL LOW (ref 3.5–5.0)
Alkaline Phosphatase: 91 U/L (ref 38–126)
Anion gap: 10 (ref 5–15)
BUN: 34 mg/dL — ABNORMAL HIGH (ref 8–23)
CO2: 22 mmol/L (ref 22–32)
Calcium: 7.7 mg/dL — ABNORMAL LOW (ref 8.9–10.3)
Chloride: 100 mmol/L (ref 98–111)
Creatinine, Ser: 1.37 mg/dL — ABNORMAL HIGH (ref 0.61–1.24)
GFR, Estimated: 52 mL/min — ABNORMAL LOW (ref >60)
Glucose, Bld: 112 mg/dL — ABNORMAL HIGH (ref 70–99)
Potassium: 3.3 mmol/L — ABNORMAL LOW (ref 3.5–5.1)
Sodium: 132 mmol/L — ABNORMAL LOW (ref 135–145)
Total Bilirubin: 1 mg/dL (ref 0.0–1.2)
Total Protein: 5.3 g/dL — ABNORMAL LOW (ref 6.5–8.1)

## 2024-10-03 LAB — CBC WITH DIFFERENTIAL/PLATELET
Abs Immature Granulocytes: 0.17 K/uL — ABNORMAL HIGH (ref 0.00–0.07)
Basophils Absolute: 0 K/uL (ref 0.0–0.1)
Basophils Relative: 0 %
Eosinophils Absolute: 0.1 K/uL (ref 0.0–0.5)
Eosinophils Relative: 1 %
HCT: 27.8 % — ABNORMAL LOW (ref 39.0–52.0)
Hemoglobin: 9.5 g/dL — ABNORMAL LOW (ref 13.0–17.0)
Immature Granulocytes: 2 %
Lymphocytes Relative: 13 %
Lymphs Abs: 1.4 K/uL (ref 0.7–4.0)
MCH: 29.2 pg (ref 26.0–34.0)
MCHC: 34.2 g/dL (ref 30.0–36.0)
MCV: 85.5 fL (ref 80.0–100.0)
Monocytes Absolute: 1.4 K/uL — ABNORMAL HIGH (ref 0.1–1.0)
Monocytes Relative: 12 %
Neutro Abs: 8.2 K/uL — ABNORMAL HIGH (ref 1.7–7.7)
Neutrophils Relative %: 72 %
Platelets: 322 K/uL (ref 150–400)
RBC: 3.25 MIL/uL — ABNORMAL LOW (ref 4.22–5.81)
RDW: 14.5 % (ref 11.5–15.5)
WBC: 11.3 K/uL — ABNORMAL HIGH (ref 4.0–10.5)
nRBC: 0 % (ref 0.0–0.2)

## 2024-10-03 LAB — APTT: aPTT: 81 s — ABNORMAL HIGH (ref 24–36)

## 2024-10-03 LAB — MAGNESIUM: Magnesium: 2 mg/dL (ref 1.7–2.4)

## 2024-10-03 LAB — HEPARIN LEVEL (UNFRACTIONATED): Heparin Unfractionated: 0.29 [IU]/mL — ABNORMAL LOW (ref 0.30–0.70)

## 2024-10-03 MED ORDER — PANTOPRAZOLE SODIUM 40 MG IV SOLR
40.0000 mg | Freq: Two times a day (BID) | INTRAVENOUS | Status: DC
  Administered 2024-10-03 – 2024-10-07 (×8): 40 mg via INTRAVENOUS
  Filled 2024-10-03 (×8): qty 10

## 2024-10-03 MED ORDER — FUROSEMIDE 10 MG/ML IJ SOLN
80.0000 mg | Freq: Every day | INTRAMUSCULAR | Status: DC
  Administered 2024-10-03 – 2024-10-06 (×4): 80 mg via INTRAVENOUS
  Filled 2024-10-03 (×4): qty 8

## 2024-10-03 MED ORDER — METOPROLOL SUCCINATE ER 50 MG PO TB24
50.0000 mg | ORAL_TABLET | Freq: Every day | ORAL | Status: DC
  Administered 2024-10-04 – 2024-10-08 (×5): 50 mg via ORAL
  Filled 2024-10-03 (×5): qty 1

## 2024-10-03 MED ORDER — SODIUM CHLORIDE 0.9 % IV SOLN
2.0000 g | Freq: Two times a day (BID) | INTRAVENOUS | Status: DC
  Administered 2024-10-03 – 2024-10-04 (×2): 2 g via INTRAVENOUS
  Filled 2024-10-03 (×2): qty 12.5

## 2024-10-03 MED ORDER — POTASSIUM CHLORIDE CRYS ER 20 MEQ PO TBCR
40.0000 meq | EXTENDED_RELEASE_TABLET | Freq: Two times a day (BID) | ORAL | Status: AC
  Administered 2024-10-03 (×2): 40 meq via ORAL
  Filled 2024-10-03 (×2): qty 2

## 2024-10-03 NOTE — Progress Notes (Addendum)
 Progress Note  Patient Name: Lance Hubbard Date of Encounter: 10/03/2024 [facility] Cardiologist: Wilbert Bihari, MD   Interval Summary   Reported feeling better though ongoing shortness of breath. Was able to walk around the unit without significant difficulty.   Vital Signs Vitals:   10/03/24 0323 10/03/24 0331 10/03/24 0629 10/03/24 0834  BP: 100/68   118/87  Pulse:  98  (!) 106  Resp: 17   20  Temp: 97.6 F (36.4 C)   98 F (36.7 C)  TempSrc: Oral   Oral  SpO2:    99%  Weight:   109.7 kg   Height:        Intake/Output Summary (Last 24 hours) at 10/03/2024 0946 Last data filed at 10/03/2024 0630 Gross per 24 hour  Intake 250 ml  Output 6550 ml  Net -6300 ml      10/03/2024    6:29 AM 09/29/2024    2:25 PM 09/27/2024    9:47 PM  Last 3 Weights  Weight (lbs) 241 lb 14.4 oz 220 lb 0.3 oz 220 lb  Weight (kg) 109.725 kg 99.8 kg 99.791 kg      Telemetry/ECG  AF HR ~100, occasional PVC - Personally Reviewed  Physical Exam  GEN: No acute distress.   Neck: JVD with HJR Cardiac: Irregularly, irregular, no murmur. Radial pulses 2+ Respiratory: Crackles to bases GI: Soft, nontender, distended MS: No edema  Assessment & Plan  Patient with past medical history significant for atrial fibrillation status post PVI 05/2021, hypothyroidism, hypertension, CML, hyperlipidemia, tobacco use. Patient admitted for sepsis secondary to biliary pancreatitis status post cholecystectomy. Cardiology following for recurrence of atrial fibrillation and acute HFrEF.  Atrial fibrillation with episode of RVR Currently in AF Chad Vasc score 3 Keep K > 4 and Mag > 2, K repletion ordered  Plan is to pursue DCCV, most likely outpatient once patient is not requiring procedures and hgb is stable. BP improving, may can increase BB tomorrow.  Continue IV heparin  for now, see anemia below.  Continue Toprol , increase dose to 50 mg daily Continue IV amiodarone  for now, would like to convert  to oral soon possibly tomorrow  Acute on chronic HFrEF  Hypoalbuminemia [2.4] Patient reports ongoing shortness of breath though much improved.  Has been receiving IV diuresis. Net IO Since Admission: -389.07 mL [10/03/24 0946] Weight is up from admission +20lbs Cr 1.37 today, 1.08 yesterday On exam, patient still appears volume up though could be confounded by hypoalbuminemia.   Give IV lasix  80 mg this afternoon  Continue Toprol  37.5 mg Hypotension has limited GDMT, PTA entresto  on hold. Would continue to hold to allow BP room for rate control.   Pleural effusion  RT thoracentesis with 1.4L of hazy amber fluid removed. Not sent for labs.  Hyperlipidemia Continue crestor  5 mg   Anemia  Hgb today 9.5, 9.9 yesterday. Patient reports dark stools.  Continue IV heparin  as able.  Per primary  Per primary Sepsis likely choledocholithiasis, biliary leak Elevated LFTs- improving Intra-abdominal fluid collection CML Hypothyroidism AKI   For questions or updates, please contact [facility] Please consult [email or web address] for contact info under       Signed, [name], PA-C    Patient seen and examined.  Agree with above documentation.  On exam, patient is alert and oriented, irregular rhythm, tachycardic, no murmurs, diminished breath sounds, + LE edema and JVD.  Net -6.3 L yesterday.  Creatinine bump 1.08 > 1.37.  Remains hypervolemic  on exam, Lasix  was held this morning.  Was markedly net negative yesterday, will decrease IV Lasix  to 80 mg daily today.  Heart rate elevated, will increase Toprol -XL to 50 mg  [name], MD

## 2024-10-03 NOTE — Progress Notes (Signed)
 Physical Therapy Treatment Patient Details Name: Lance Hubbard MRN: [id number] DOB: 1941/01/27 Today's Date: 10/03/2024   History of Present Illness 83 y.o. male admitted 09/27/24 with worsening abdominal pain. Abdominal CT showing multiple gallstones, cholecystectomy changes concerning for abscess vs biliary leak; potential ileus. S/p ERCP 12/5. Course complicated by afib. Of note, s/p lap chole 11/28 for gallstone pancreatitis. Other PMH includes CHF, HTN, CML, afib.    PT Comments  Pt tolerated session well, completing stair negotiation laterally utilizing railing for bilaterally upper extremity support. Pt declined further ambulation, reporting he recently ambulated with mobility, but was willing to do some standing marches in the room. Pt would benefit from further gait and stair training, and LE strengthening. PT will continue to treat pt while he is admitted. Recommending HHPT at discharge to address remaining mobility deficits and optimize return to PLOF.     If plan is discharge home, recommend the following: A little help with bathing/dressing/bathroom;Assistance with cooking/housework;Assist for transportation;Help with stairs or ramp for entrance   Can travel by private vehicle        Equipment Recommendations  Rollator (4 wheels)    Recommendations for Other Services       Precautions / Restrictions Precautions Precautions: Fall (watch HR) Recall of Precautions/Restrictions: Intact Precaution/Restrictions Comments: watch HR; abdominal prec for comfort Restrictions Weight Bearing Restrictions Per Provider Order: No     Mobility  Bed Mobility Overal bed mobility: Modified Independent             General bed mobility comments: pt received sitting in recliner and returned to recliner at end of session    Transfers Overall transfer level: Needs assistance Equipment used: Rolling walker (2 wheels) Transfers: Sit to/from Stand Sit to Stand: Supervision            General transfer comment: pt completed sit to stand from recliner with RW and no physical assistance. Pt pushes up from recliner with BUE. Increased time to complete.    Ambulation/Gait Ambulation/Gait assistance: Supervision Gait Distance (Feet): 8 Feet Assistive device: Rolling walker (2 wheels) Gait Pattern/deviations: Step-through pattern, Decreased stride length, Trunk flexed, Wide base of support, Drifts right/left Gait velocity: reduced Gait velocity interpretation: <1.8 ft/sec, indicate of risk for recurrent falls   General Gait Details: Pt ambulated 8' around bed with RW and deferred further mobility. Pt then completed standing marching with BUE supported on bed railing. Pt with significant trunk, hip and knee flexion throughout.   Stairs Stairs: Yes Stairs assistance: Contact guard assist Stair Management: One rail Left, Step to pattern, Sideways Number of Stairs: 3 General stair comments: Pt completed stair negotiation laterally, with BUE supported on L bed railing. Pt demonstrates good control throughout with no notable LOB.   Wheelchair Mobility     Tilt Bed    Modified Rankin (Stroke Patients Only)       Balance Overall balance assessment: Needs assistance Sitting-balance support: No upper extremity supported, Feet supported Sitting balance-Leahy Scale: Good Sitting balance - Comments: seated edge of recliner   Standing balance support: Bilateral upper extremity supported, During functional activity, Reliant on assistive device for balance Standing balance-Leahy Scale: Poor Standing balance comment: reliant on external support                            Communication Communication Communication: Impaired Factors Affecting Communication: Hearing impaired  Cognition Arousal: Alert Behavior During Therapy: WFL for tasks assessed/performed   PT - Cognitive impairments:  No apparent impairments                         Following  commands: Intact      Cueing Cueing Techniques: Verbal cues  Exercises General Exercises - Lower Extremity Hip Flexion/Marching: AROM, Both, 20 reps (x2 sets)    General Comments General comments (skin integrity, edema, etc.): HR 114bpm while performing stair negotiation      Pertinent Vitals/Pain Pain Assessment Pain Assessment: 0-10 Pain Score: 2  Faces Pain Scale: Hurts a little bit Pain Location: RUQ Pain Descriptors / Indicators: Discomfort Pain Intervention(s): Monitored during session, Limited activity within patient's tolerance    Home Living                          Prior Function            PT Goals (current goals can now be found in the care plan section) Acute Rehab PT Goals Patient Stated Goal: regain strength PT Goal Formulation: With patient Time For Goal Achievement: 10/15/24 Potential to Achieve Goals: Good Progress towards PT goals: Progressing toward goals    Frequency    Min 2X/week      PT Plan      Co-evaluation              AM-PAC PT 6 Clicks Mobility   Outcome Measure  Help needed turning from your back to your side while in a flat bed without using bedrails?: None Help needed moving from lying on your back to sitting on the side of a flat bed without using bedrails?: None Help needed moving to and from a bed to a chair (including a wheelchair)?: A Little Help needed standing up from a chair using your arms (e.g., wheelchair or bedside chair)?: A Little Help needed to walk in hospital room?: A Little Help needed climbing 3-5 steps with a railing? : A Little 6 Click Score: 20    End of Session   Activity Tolerance: Patient tolerated treatment well Patient left: in chair;with call bell/phone within reach Nurse Communication: Mobility status PT Visit Diagnosis: Other abnormalities of gait and mobility (R26.89)     Time: 8498-8483 PT Time Calculation (min) (ACUTE ONLY): 15 min  Charges:    $Gait Training:  8-22 mins PT General Charges $$ ACUTE PT VISIT: 1 Visit                     Leontine Hilt DPT Acute Rehab Services [phone] Prefer contact via chat    Leontine NOVAK Merritt Mccravy 10/03/2024, 3:23 PM

## 2024-10-03 NOTE — Progress Notes (Signed)
 PHARMACY NOTE:  ANTIMICROBIAL RENAL DOSAGE ADJUSTMENT  Current antimicrobial regimen includes a mismatch between antimicrobial dosage and estimated renal function.  As per policy approved by the Pharmacy & Therapeutics and Medical Executive Committees, the antimicrobial dosage will be adjusted accordingly.  Current antimicrobial dosage:  cefepime  q8h  Indication: IAI  Renal Function:  Estimated Creatinine Clearance: 54.8 mL/min (A) (by C-G formula based on SCr of 1.37 mg/dL (H)). []      On intermittent HD, scheduled: []      On CRRT    Antimicrobial dosage has been changed to:  cefepime  q12h  Additional comments:   Thank you for allowing pharmacy to be a part of this patient's care.  Lance Hubbard, [facility] 10/03/2024 7:38 AM

## 2024-10-03 NOTE — Progress Notes (Signed)
 4 Days Post-Op   Subjective/Chief Complaint: Overall doing better - less SOB s/p thora. Intermittent RUQ discomfort described as a catch. No N/V. BRIGITTE.BUHL yesterday that were dark but he is unsure the color.   afebrile Hgb stable 9.5 from 9.9 WBC 11 from 11   Objective: Vital signs in last 24 hours: Temp:  [97.6 F (36.4 C)-98.2 F (36.8 C)] 98 F (36.7 C) (12/09 0834) Pulse Rate:  [97-117] 106 (12/09 0834) Resp:  [16-20] 20 (12/09 0834) BP: (100-118)/(68-87) 118/87 (12/09 0834) SpO2:  [99 %] 99 % (12/09 0834) Weight:  [109.7 kg] 109.7 kg (12/09 0629) Last BM Date : 10/01/24  Intake/Output from previous day: 12/08 0701 - 12/09 0700 In: 490 [P.O.:490] Out: 6750 [Urine:6750] Intake/Output this shift: No intake/output data recorded.  General appearance: alert and cooperative up in the chair Resp: normal effort ORA Cardio: irregularly irregular rhythm HR 105-120 bpm during my exam GI: soft, overall non-tender, abdominal wall ecchymosis stable, no peritonitis   Lab Results:  Recent Labs    10/02/24 0545 10/03/24 0314  WBC 11.7* 11.3*  HGB 9.9* 9.5*  HCT 28.6* 27.8*  PLT 344 322   BMET Recent Labs    10/02/24 0545 10/03/24 0314  NA 128* 132*  K 3.6 3.3*  CL 95* 100  CO2 21* 22  GLUCOSE 165* 112*  BUN 30* 34*  CREATININE 1.08 1.37*  CALCIUM  7.3* 7.7*   PT/INR No results for input(s): LABPROT, INR in the last 72 hours. ABG No results for input(s): PHART, HCO3 in the last 72 hours.  Invalid input(s): PCO2, PO2  Studies/Results: IR THORACENTESIS ASP PLEURAL SPACE W/IMG GUIDE Result Date: 10/02/2024 INDICATION: 83 year old male with history of heart failure, previous imaging showed right pleural effusion. Request for therapeutic thoracentesis. EXAM: ULTRASOUND GUIDED RIGHT THORACENTESIS MEDICATIONS: 5 mL 1% lidocaine  with epinephrine  COMPLICATIONS: None immediate. PROCEDURE: An ultrasound guided thoracentesis was thoroughly discussed with the patient  and questions answered. The benefits, risks, alternatives and complications were also discussed. The patient understands and wishes to proceed with the procedure. Written consent was obtained. Ultrasound was performed to localize and mark an adequate pocket of fluid in the right chest. The area was then prepped and draped in the normal sterile fashion. 1% Lidocaine  was used for local anesthesia. Under ultrasound guidance a 6 Fr Safe-T-Centesis catheter was introduced. Thoracentesis was performed. The catheter was removed and a dressing applied. FINDINGS: A total of approximately 1.4 L of hazy amber fluid was removed. Post procedure chest X-ray reviewed, negative for pneumothorax. IMPRESSION: Successful ultrasound guided right thoracentesis yielding 1.4 L of pleural fluid. Performed by: Aimee Han, PA-C. Electronically Signed   By: Wilkie Lent M.D.   On: 10/02/2024 12:24   DG Chest 1 View Result Date: 10/02/2024 CLINICAL DATA:  Left thoracentesis. EXAM: CHEST  1 VIEW COMPARISON:  10/01/2024 FINDINGS: Low volume film. No pneumothorax. The cardio pericardial silhouette is enlarged. Retrocardiac atelectasis or infiltrate noted. No substantial pleural effusion after thoracentesis. Left PICC line tip is positioned over the mid SVC level. No evidence for pneumothorax. IMPRESSION: 1. No evidence for pneumothorax after thoracentesis. 2. Retrocardiac atelectasis or infiltrate. Electronically Signed   By: Camellia Candle M.D.   On: 10/02/2024 11:38   CT CHEST ABDOMEN PELVIS WO CONTRAST Result Date: 10/01/2024 EXAM: CT CHEST, ABDOMEN AND PELVIS WITHOUT CONTRAST 10/01/2024 03:55:06 PM TECHNIQUE: CT of the chest, abdomen and pelvis was performed without the administration of intravenous contrast. Multiplanar reformatted images are provided for review. Automated exposure control, iterative reconstruction,  and/or weight based adjustment of the mA/kV was utilized to reduce the radiation dose to as low as reasonably  achievable. COMPARISON: Prior examination of 10/24/2024. CLINICAL HISTORY: rule out biloma rule out biloma FINDINGS: CHEST: MEDIASTINUM AND LYMPH NODES: Extensive multivessel coronary artery calcifications. Global cardiac size within normal limits. No pericardial effusion. The central airways are clear. Central pulmonary arteries are of normal caliber. Mild atherosclerotic calcification within the thoracic aorta. No aortic aneurysm. Left upper extremity PICC line tip seen within the superior vena cava. No mediastinal, hilar or axillary lymphadenopathy. LUNGS AND PLEURA: Moderate right and small left pleural effusions are present with associated bibasilar compressed atelectasis. No superimposed confluent pulmonary infiltrate. No pneumothorax. No central obstructing lesion. ABDOMEN AND PELVIS: LIVER: Since the prior examination of 10/24/2024, internal stents are seen within the distal common bile duct and pancreatic duct extending into the second portion of the duodenum through the ampulla. Previously noted calcified gallstones within the distal common bile duct have been evacuated. Loculated fluid collection within the gallbladder fossa has decreased measuring 2.2 x 5.4 cm. Infiltrative changes within the right upper quadrant which may represent a combination of postsurgical change and bowel related to an underlying biliary leak is again noted. Small right subdiaphragmatic fluid is again identified and appears stable. A simple cyst within the left hepatic lobe. Mild pneumobilia is present in keeping with biliary stenting. The liver is otherwise unremarkable in this noncontrast examination. GALLBLADDER AND BILE DUCTS: Internal stents are seen within the distal common bile duct and pancreatic duct extending into the second portion of the duodenum through the ampulla. Previously noted calcified gallstones within the distal common bile duct have been evacuated. Loculated fluid collection within the gallbladder fossa has  decreased measuring 2.2 x 5.4 cm. Infiltrative changes within the right upper quadrant which may represent a combination of postsurgical change and bowel related to an underlying biliary leak is again noted. Small right subdiaphragmatic fluid is again identified and appears stable. Mild pneumobilia is present in keeping with biliary stenting. No biliary ductal dilatation. SPLEEN: No acute abnormality. PANCREAS: Internal stents are seen within the distal common bile duct and pancreatic duct extending into the second portion of the duodenum through the ampulla. The pancreas is otherwise unremarkable. ADRENAL GLANDS: No acute abnormality. KIDNEYS, URETERS AND BLADDER: Mild bilateral nonspecific perinephric stranding is unchanged. The kidneys are otherwise unremarkable. No stones in the kidneys or ureters. No hydronephrosis. Marked prostatic hypertrophy. The bladder is not distended. GI AND BOWEL: Stomach demonstrates no acute abnormality. There is no bowel obstruction. REPRODUCTIVE ORGANS: Marked prostatic hypertrophy. PERITONEUM AND RETROPERITONEUM: Mild ascites again noted. No free air. VASCULATURE: Aorta is normal in caliber. Extensive aortoiliac atherosclerotic calcification. No aortic aneurysm. ABDOMINAL AND PELVIS LYMPH NODES: No lymphadenopathy. REPRODUCTIVE ORGANS: Marked prostatic hypertrophy. BONES AND SOFT TISSUES: Moderate right fat-containing inguinal hernia is unchanged. Progressive subcutaneous edema within the body wall in keeping with progressive anasarca. Osseous structures are age-appropriate. No acute bone abnormality. No lytic or blastic bone lesion. IMPRESSION: 1. Moderate right and small left pleural effusions with associated bibasilar compressed atelectasis. Development of mild ascites and progressive body wall subcutaneous edema in keeping with progressive anasarca. 2. Status post cholecystectomy. Loculated fluid collection within the gallbladder fossa has decreased in size, measuring 2.2 x 5.4  cm. 3. Internal stenting of the procedure or location at the interval the distal common bile duct and pancreatic duct extending into the second portion of the duodenum through the ampulla, with interval evacuation of previously noted calcified gallstones  from the distal common bile duct. 4. Right upper quadrant infiltrative changes again noted, reflecting either postsurgical change or inflammatory change related to biliary leak identified on endoscopic examination of September 29, 2024. . 5. Extensive multivessel coronary artery calcifications. 6. Moderate right fat-containing inguinal hernia, unchanged. 7. Marked prostatic hypertrophy. Electronically signed by: Dorethia Molt MD 10/01/2024 08:18 PM EST RP Workstation: [id number]   DG Chest Port 1 View Result Date: 10/01/2024 EXAM: 1 VIEW(S) XRAY OF THE CHEST 10/01/2024 01:26:26 PM COMPARISON: Yesterday. CLINICAL HISTORY: Status post peripherally inserted central catheter (PICC) central line placement. FINDINGS: LINES, TUBES AND DEVICES: Peripherally inserted central catheter (PICC) central line is in appropriate position. LUNGS AND PLEURA: Minimal right basilar subsegmental atelectasis is noted. Small right pleural effusion. No pneumothorax. HEART AND MEDIASTINUM: Stable cardiomediastinal silhouette. BONES AND SOFT TISSUES: No acute osseous abnormality. IMPRESSION: 1. Small right pleural effusion with minimal right basilar subsegmental atelectasis. Electronically signed by: Lynwood Seip MD 10/01/2024 01:48 PM EST RP Workstation: [id number]    Anti-infectives: Anti-infectives (From admission, onward)    Start     Dose/Rate Route Frequency Ordered Stop   10/03/24 1445  ceFEPIme  (MAXIPIME ) 2 g in sodium chloride  0.9 % 100 mL IVPB        2 g 200 mL/hr over 30 Minutes Intravenous Every 12 hours 10/03/24 0738     10/02/24 1000  ceFEPIme  (MAXIPIME ) 2 g in sodium chloride  0.9 % 100 mL IVPB  Status:  Discontinued        2 g 200 mL/hr over 30 Minutes Intravenous  Every 8 hours 10/02/24 0950 10/03/24 0738   09/28/24 1200  ceFEPIme  (MAXIPIME ) 2 g in sodium chloride  0.9 % 100 mL IVPB  Status:  Discontinued        2 g 200 mL/hr over 30 Minutes Intravenous Every 12 hours 09/28/24 0810 10/02/24 0950   09/28/24 0815  metroNIDAZOLE  (FLAGYL ) IVPB 500 mg        500 mg 100 mL/hr over 60 Minutes Intravenous Every 12 hours 09/28/24 0803     09/28/24 0815  vancomycin  (VANCOREADY) IVPB 1500 mg/300 mL  Status:  Discontinued        1,500 mg 150 mL/hr over 120 Minutes Intravenous Every 24 hours 09/28/24 0810 10/02/24 0927   09/28/24 0015  ceFEPIme  (MAXIPIME ) 2 g in sodium chloride  0.9 % 100 mL IVPB        2 g 200 mL/hr over 30 Minutes Intravenous  Once 09/28/24 0005 09/28/24 0148   09/28/24 0015  metroNIDAZOLE  (FLAGYL ) IVPB 500 mg        500 mg 100 mL/hr over 60 Minutes Intravenous  Once 09/28/24 0005 09/28/24 0249       Assessment/Plan: s/p Procedure(s): ERCP, WITH INTERVENTION IF INDICATED (N/A) Biliary pancreatitis   POD 11, s/p laparoscopic cholecystectomy  Choledocholithiasis - CT 12/3 with 6.7x2.1 cm collection in GB fossa may be post-op but adjacent fat stranding and fluid tracking inferiorly, possible abscess vs biloma, reactive duodenitis, choledocholithiasis, moderate to large stool burden - ERCP 12/5 did show bile leak present - CT C/A/P 12/7 with moderate right and small left pleural effusions. Interval decrease in RUQ fluid collection (2x5 cm).  - s/p thora 12/8 - bile leak stented, RUQ collection getting smaller. No acute surgical needs. Recommend 5 days of PO abx at discharge (given PCN allergy would do cipro /flagyl  or get recs from pharmacy). He has follow up in our office on 12/23 at 10 AM. CCS will sign off. Please call as needed. Outpatient follow up  with GI for stent retrieval.    FEN: soft diet, IVF per [facility] VTE: SCDs, hep gtt ID: cefepime /vanc/flagyl    - per [facility] -  A. Fib with RVR - cardiology follwing HTN CML Osteoarthritis   LOS:  5 days    Almarie GORMAN Pringle PA-C 10/03/2024

## 2024-10-03 NOTE — Progress Notes (Addendum)
 PROGRESS NOTE  [name] FMW:[id number] DOB: 07/25/1941 DOA: 09/27/2024 PCP: Frederik Charleston, MD  HPI/Recap of past 24 hours: Lance Hubbard is a 83 y.o. male with medical history significant for hypertension, chronic atrial fibrillation, CML and osteoarthritis, who presented to the ER with acute onset of severe upper abdominal pain with associated hypotension. Pt is s/p laparoscopic cholecystectomy on 11/28, now reporting subsequent severe epigastric pain radiating to his right lateral abdomen. In the ED, BP was 118/77, HR 171, RR 26. Labs revealed lipase 146 AST 69 ALT 102, total bili of 3.9 up from 1.4, WBC 16.8. EKG showed atrial fibrillation with RVR, PVCs and low voltage QRS.  CT abdomen pelvis showed 2 stones in the distal CBD, with third gallstone within the duodenum all measuring about 6 mm, noted interval change of cholecystectomy with a 6.7 x 2.1 cm fluid collection in the gallbladder fossa, adjacent fat stranding, and fluid tracking inferiorly in the pericolic gutter. Findings may be postoperative however are concerning for developing abscess vs concern for biliary leak, HIDA scan is recommended for further evaluation. Moderate right pleural effusion and atelectasis or pneumonia in the right lower lobe, moderate to large colonic stool burden.  Cardiology, GI and general surgery consulted.  Patient admitted for further management.     Today, patient reports SOB has improved, but still concerned about his weight gain.  Reports some discomfort around his RUQ abdomen.  Still having liquid stools, unsure of the color.  Patient denies any chest pain, nausea/vomiting, fever/chills.    Assessment/Plan: Principal Problem:   Atrial fibrillation with RVR (HCC) Active Problems:   Elevated LFTs   CML (chronic myelocytic leukemia) (HCC)   Acute on chronic HFrEF (heart failure with reduced ejection fraction) (HCC)   Dyslipidemia   Hypothyroidism   Family history of CML (chronic monocytic  leukemia)   Coronary artery calcification seen on CAT scan   Pure hypercholesterolemia   RUQ pain   Dilated cardiomyopathy (HCC)   Pulmonary hypertension, unspecified (HCC)   Biloma following surgery   Bile leak   Sepsis likely choledocholithiasis, biliary leak Elevated LFTs-resolved Intra-abdominal fluid collection-improving On presentation, noted to be tachycardic, tachypneic, hypotension with leukocytosis Currently afebrile, resolving leukocytosis BC x 2 NGTD GI consulted, s/p ERCP on 09/29/2024 showed choledocholithiasis with biliary sphincterotomy, s/p stent in both pancreatic and common bile duct.  Noted biliary leak.  Repeat ERCP to remove stents in 2 months Repeat CT abdomen/pelvis/chest on 12/7 after ERCP showed development of mild ascites and progressive body wall subcutaneous edema in keeping with progressive anasarca. S/p cholecystectomy. Loculated fluid collection within the gallbladder fossa has decreased in size, measuring 2.2 x 5.4 cm. No stone in CBD General surgery, recommend discharge with 5 days of antibiotics, outpatient follow-up IR consulted for possible percutaneous drain placement, recommended drain not warranted at this time, if concern or signs of clinical deterioration, recommend repeat CT AP with contrast for additional evaluation, if worsening fluid collection, reconsult IR Continue IV cefepime , Flagyl , d/c vancomycin  on 12/8 Daily CBC  Moderate R pleural effusion  Noted on CT chest on 12/7 S/p IR thoracentesis ordered by general surgery on 12/8, removed about 1.4 L of hazy amber fluid, no labs ordered Continue IV Lasix  as per cardiology  AKI Hyponatremia- chronic Creatinine baseline around 0.9, on admission 1.39 Daily BMP  Chronic atrial fibrillation with RVR Heart rate still with poor control Cardiology consulted, on IV amiodarone  drip, heparin , continue Toprol  Plan for outpatient cardioversion Telemetry  Anemia of chronic disease/normocytic  anemia Hemoglobin baseline around 11-12, dropped to 9.9 on 12/8 Noted an episode of dark liquid stool on 12/7 FOBT pending Monitor closely for melena/hematochezia while on IV heparin  Daily CBC   Acute on chronic systolic CHF BNP 505, no previous to compare Chest x-ray showed central pulmonary vascular engorgement, small to moderate right pleural effusion Last echo done 08/2024 showed EF of 30 to 35%, global hypokinesis, severe pulmonary hypertension 65.1 mmHg Continue Lasix  as per cardiology Hold home Entresto  for now   Hypokalemia Replace as needed   CML (chronic myelocytic leukemia)  Hold PTA bosutinib    Dyslipidemia Hold statins given elevated LFTs   Hypothyroidism Continue Synthroid          Estimated body mass index is 31.06 kg/m as calculated from the following:   Height as of this encounter: 6' 2 (1.88 m).   Weight as of this encounter: 109.7 kg.     Code Status: Full  Family Communication: Wife at bedside  Disposition Plan: Status is: Inpatient Remains inpatient appropriate because: Level of care      Consultants: Cardiology General Surgery GI  Procedures: ERCP  Antimicrobials: Cefepime  Flagyl    DVT prophylaxis: IV heparin    Objective: Vitals:   10/03/24 0323 10/03/24 0331 10/03/24 0629 10/03/24 0834  BP: 100/68   118/87  Pulse:  98  (!) 106  Resp: 17   20  Temp: 97.6 F (36.4 C)   98 F (36.7 C)  TempSrc: Oral   Oral  SpO2:    99%  Weight:   109.7 kg   Height:        Intake/Output Summary (Last 24 hours) at 10/03/2024 1145 Last data filed at 10/03/2024 0630 Gross per 24 hour  Intake 250 ml  Output 6550 ml  Net -6300 ml   Filed Weights   09/27/24 2147 09/29/24 1425 10/03/24 0629  Weight: 99.8 kg 99.8 kg 109.7 kg    Exam: General: NAD Cardiovascular: S1, S2 present Respiratory: Diminished breath sounds b/l Abdomen: Soft, nontender, nondistended, bowel sounds present Musculoskeletal: No bilateral pedal edema  noted Skin: Normal Psychiatry: Normal mood    Data Reviewed: CBC: Recent Labs  Lab 09/29/24 0402 09/30/24 0418 10/01/24 0505 10/02/24 0545 10/03/24 0314  WBC 10.7* 9.7 15.0* 11.7* 11.3*  NEUTROABS 8.2* 9.0* 12.6* 8.9* 8.2*  HGB 10.6* 10.8* 11.3* 9.9* 9.5*  HCT 31.5* 31.4* 32.9* 28.6* 27.8*  MCV 86.8 84.9 84.1 85.6 85.5  PLT 269 310 388 344 322   Basic Metabolic Panel: Recent Labs  Lab 09/27/24 2132 09/28/24 0434 09/29/24 0402 09/30/24 0418 10/01/24 0505 10/02/24 0545 10/03/24 0314  NA 130*   < > 131* 127* 129* 128* 132*  K 3.7   < > 3.9 4.0 3.8 3.6 3.3*  CL 98   < > 98 98 101 95* 100  CO2 25   < > 22 21* 22 21* 22  GLUCOSE 125*   < > 100* 157* 120* 165* 112*  BUN 10   < > 16 20 29* 30* 34*  CREATININE 1.39*   < > 1.06 1.03 1.11 1.08 1.37*  CALCIUM  8.3*   < > 7.7* 7.7* 7.8* 7.3* 7.7*  MG 1.9  --   --   --   --  1.7 2.0   < > = values in this interval not displayed.   GFR: Estimated Creatinine Clearance: 54.8 mL/min (A) (by C-G formula based on SCr of 1.37 mg/dL (H)). Liver Function Tests: Recent Labs  Lab 09/29/24 0402 09/30/24 0418 10/01/24  0505 10/02/24 0545 10/03/24 0314  AST 38 25 21 22 21   ALT 80* 68* 58* 47* 42  ALKPHOS 168* 146* 121 108 91  BILITOT 1.4* 1.2 0.8 1.0 1.0  PROT 5.6* 5.9* 6.1* 5.2* 5.3*  ALBUMIN 2.3* 2.5* 2.6* 2.3* 2.4*   Recent Labs  Lab 09/27/24 2132  LIPASE 146*   No results for input(s): AMMONIA in the last 168 hours. Coagulation Profile: Recent Labs  Lab 09/27/24 2132  INR 1.5*   Cardiac Enzymes: No results for input(s): CKTOTAL, CKMB, CKMBINDEX, TROPONINI in the last 168 hours. BNP (last 3 results) No results for input(s): PROBNP in the last 8760 hours. HbA1C: No results for input(s): HGBA1C in the last 72 hours. CBG: No results for input(s): GLUCAP in the last 168 hours. Lipid Profile: No results for input(s): CHOL, HDL, LDLCALC, TRIG, CHOLHDL, LDLDIRECT in the last 72 hours. Thyroid   Function Tests: No results for input(s): TSH, T4TOTAL, FREET4, T3FREE, THYROIDAB in the last 72 hours.  Anemia Panel: No results for input(s): VITAMINB12, FOLATE, FERRITIN, TIBC, IRON, RETICCTPCT in the last 72 hours. Urine analysis:    Component Value Date/Time   COLORURINE AMBER (A) 09/30/2024 0850   APPEARANCEUR CLEAR 09/30/2024 0850   LABSPEC 1.024 09/30/2024 0850   PHURINE 5.0 09/30/2024 0850   GLUCOSEU NEGATIVE 09/30/2024 0850   HGBUR NEGATIVE 09/30/2024 0850   BILIRUBINUR NEGATIVE 09/30/2024 0850   KETONESUR 5 (A) 09/30/2024 0850   PROTEINUR NEGATIVE 09/30/2024 0850   NITRITE NEGATIVE 09/30/2024 0850   LEUKOCYTESUR NEGATIVE 09/30/2024 0850   Sepsis Labs: @LABRCNTIP (procalcitonin:4,lacticidven:4)  ) Recent Results (from the past 240 hours)  Blood culture (routine x 2)     Status: None   Collection Time: 09/27/24 10:09 PM   Specimen: BLOOD RIGHT ARM  Result Value Ref Range Status   Specimen Description BLOOD RIGHT ARM  Final   Special Requests   Final    BOTTLES DRAWN AEROBIC AND ANAEROBIC Blood Culture adequate volume   Culture   Final    NO GROWTH 5 DAYS Performed at [facility], [address], [place], [place] [address]    Report Status 10/02/2024 FINAL  Final  Blood culture (routine x 2)     Status: None   Collection Time: 09/27/24 10:09 PM   Specimen: BLOOD RIGHT HAND  Result Value Ref Range Status   Specimen Description BLOOD RIGHT HAND  Final   Special Requests   Final    BOTTLES DRAWN AEROBIC AND ANAEROBIC Blood Culture adequate volume   Culture   Final    NO GROWTH 5 DAYS Performed at [facility], [address] [place], [place] [address]    Report Status 10/02/2024 FINAL  Final  Resp panel by RT-PCR (RSV, Flu A&B, Covid) Anterior Nasal Swab     Status: None   Collection Time: 09/27/24 10:21 PM   Specimen: Anterior Nasal Swab  Result Value Ref Range Status   SARS Coronavirus 2 by RT PCR NEGATIVE NEGATIVE Final    Influenza A by PCR NEGATIVE NEGATIVE Final   Influenza B by PCR NEGATIVE NEGATIVE Final    Comment: (NOTE) The Xpert Xpress SARS-CoV-2/FLU/RSV plus assay is intended as an aid in the diagnosis of influenza from Nasopharyngeal swab specimens and should not be used as a sole basis for treatment. Nasal washings and aspirates are unacceptable for Xpert Xpress SARS-CoV-2/FLU/RSV testing.  Fact Sheet for Patients: bloggercourse.com  Fact Sheet for Healthcare Providers: seriousbroker.it  This test is not yet approved or  cleared by the [place]  FDA and has been authorized for detection and/or diagnosis of SARS-CoV-2 by FDA under an Emergency Use Authorization (EUA). This EUA will remain in effect (meaning this test can be used) for the duration of the COVID-19 declaration under Section 564(b)(1) of the Act, 21 U.S.C. section 360bbb-3(b)(1), unless the authorization is terminated or revoked.     Resp Syncytial Virus by PCR NEGATIVE NEGATIVE Final    Comment: (NOTE) Fact Sheet for Patients: bloggercourse.com  Fact Sheet for Healthcare Providers: seriousbroker.it  This test is not yet approved or cleared by the [place]  FDA and has been authorized for detection and/or diagnosis of SARS-CoV-2 by FDA under an Emergency Use Authorization (EUA). This EUA will remain in effect (meaning this test can be used) for the duration of the COVID-19 declaration under Section 564(b)(1) of the Act, 21 U.S.C. section 360bbb-3(b)(1), unless the authorization is terminated or revoked.  Performed at [facility], [address] [place], [place] [address]       Studies: DG CHEST PORT 1 VIEW Result Date: 09/28/2024 CLINICAL DATA:  AFib. EXAM: PORTABLE CHEST 1 VIEW COMPARISON:  CT abdomen/pelvis dated 09/27/2024. Chest radiograph 09/22/2019. FINDINGS: Low lung volumes. Cardiomegaly.  Redemonstrated small layering right pleural effusion with fluid in the minor fissure. Left lung appears clear. No pneumothorax. No acute osseous abnormality. IMPRESSION: 1. Redemonstrated small layering right pleural effusion. 2. Cardiomegaly. Electronically Signed   By: Harrietta Sherry M.D.   On: 09/28/2024 11:21   CT ABDOMEN PELVIS W CONTRAST Addendum Date: 09/27/2024 ADDENDUM: Upon further review and discussion with Dr. Teresa, there are two gallstones both measuring 6 mm in the distal common bile duct at the ampulla of Vater ( series 6 image 66). Likely a 3rd gallstone within the duodenum measuring 6 mm ( 6 / 68 ). Mild intrahepatic biliary dilation. Electronically signed by: Norman Gatlin MD 09/27/2024 11:45 PM EST RP Workstation: [id number]   Result Date: 09/27/2024 ORIGINAL REPORT EXAM: CT ABDOMEN AND PELVIS WITH CONTRAST 09/27/2024 10:59:59 PM TECHNIQUE: CT of the abdomen and pelvis was performed with the administration of intravenous contrast. 75 mL iohexol  (OMNIPAQUE ) 350 MG/ML injection was administered. Multiplanar reformatted images are provided for review. Automated exposure control, iterative reconstruction, and/or weight-based adjustment of the mA/kV was utilized to reduce the radiation dose to as low as reasonably achievable. COMPARISON: None available. CLINICAL HISTORY: Abdominal pain, post-op. FINDINGS: LOWER CHEST: Round consolidation in the right lower lobe. Associated moderate right pleural effusion. LIVER: The liver is unremarkable. GALLBLADDER AND BILE DUCTS: Interval change of cholecystectomy. There is a 6.7 x 2.1 cm fluid collection in the gallbladder fossa. Adjacent fat stranding and fluid tracking inferiorly in the pericolic gutter. SPLEEN: No acute abnormality. PANCREAS: No acute abnormality. ADRENAL GLANDS: No acute abnormality. KIDNEYS, URETERS AND BLADDER: No stones in the kidneys or ureters. No hydronephrosis. No perinephric or periureteral stranding. Urinary bladder is  unremarkable. GI AND BOWEL: Stomach demonstrates no acute abnormality. There is no bowel obstruction. Moderate to large stool burden in the right colon. Wall thickening and hyper-enhancement of the 2nd and 3rd portions of the duodenum. PERITONEUM AND RETROPERITONEUM: No ascites. No free air. Fluid tracking inferiorly in the pericolic gutter. VASCULATURE: Aorta is normal in caliber. LYMPH NODES: No lymphadenopathy. REPRODUCTIVE ORGANS: Enlarged prostate. BONES AND SOFT TISSUES: No acute osseous abnormality. Small subcutaneous nodules and loculated gas in the anterior subcutaneous fat likely due to injections. Moderate right and small left fat-containing inguinal hernias. IMPRESSION: 1. Interval change of cholecystectomy  with a 6.7 x 2.1 cm fluid collection in the gallbladder fossa, adjacent fat stranding, and fluid tracking inferiorly in the pericolic gutter. Findings may be postoperative however are concerning for developing abscess. If there is concern for biliary leak, HIDA scan is recommended for further evaluation. 2. Reactive duodenitis. 3. Moderate right pleural effusion and atelectasis or pneumonia in the right lower lobe. 4. Moderate to large colonic stool burden. 5. Enlarged prostate. Electronically signed by: Norman Gatlin MD 09/27/2024 11:18 PM EST RP Workstation: [id number]    Scheduled Meds:  Chlorhexidine  Gluconate Cloth  6 each Topical Daily   levothyroxine   25 mcg Oral QAC breakfast   metoprolol  succinate  37.5 mg Oral Daily   polyethylene glycol  17 g Oral Daily   potassium chloride   40 mEq Oral BID   rosuvastatin   5 mg Oral QODAY   senna-docusate  1 tablet Oral BID   sodium chloride  flush  10-40 mL Intracatheter Q12H   sodium chloride  flush  10-40 mL Intracatheter Q12H    Continuous Infusions:  amiodarone  30 mg/hr (10/03/24 1109)   ceFEPime  (MAXIPIME ) IV     heparin  2,150 Units/hr (10/03/24 1110)   metronidazole  500 mg (10/03/24 0844)     LOS: 5 days     Lebron JINNY Cage, MD [facility]  If 7PM-7AM, please contact night-coverage [email or web address] 10/03/2024, 11:45 AM

## 2024-10-03 NOTE — Progress Notes (Signed)
 Mobility Specialist: Progress Note   10/03/24 1400  Mobility  Activity Ambulated with assistance  Level of Assistance Standby assist, set-up cues, supervision of patient - no hands on  Assistive Device Front wheel walker  Distance Ambulated (ft) 100 ft  Activity Response Tolerated well  Mobility Referral Yes  Mobility visit 1 Mobility  Mobility Specialist Start Time (ACUTE ONLY) 1338  Mobility Specialist Stop Time (ACUTE ONLY) 1351  Mobility Specialist Time Calculation (min) (ACUTE ONLY) 13 min    Pt received in chair, agreeable to mobility session. Wife present and helpful. SV throughout. HR maintained 109-116 throughout ambulation but did jump to 132 at the very end of the session. No complaints other than fatigue. Left in chair with all needs met, call bell in reach.   Lance Hubbard Mobility Specialist Please contact via SecureChat or Rehab office at [phone]

## 2024-10-03 NOTE — Progress Notes (Signed)
 Progress Note   Subjective  We were reconsulted for dark stools and worsening anemia.  Patient states since his ERCP he has had dark stools, does not recall having this before the procedure.  He has been on a heparin  drip in recent days.  He denies any abdominal pain.  He is in A-fib with fluctuating heart rate, followed by cardiology, on amiodarone  drip    Objective   Vital signs in last 24 hours: Temp:  [97.6 F (36.4 C)-98.8 F (37.1 C)] 98.7 F (37.1 C) (12/09 1631) Pulse Rate:  [94-115] 115 (12/09 1631) Resp:  [17-20] 20 (12/09 1631) BP: (99-118)/(68-87) 99/72 (12/09 1631) SpO2:  [99 %-100 %] 100 % (12/09 1631) Weight:  [109.7 kg] 109.7 kg (12/09 0629) Last BM Date : 10/02/24 General:    white male in NAD Neurologic:  Alert and oriented,  grossly normal neurologically. Psych:  Cooperative. Normal mood and affect.  Intake/Output from previous day: 12/08 0701 - 12/09 0700 In: 490 [P.O.:490] Out: 6750 [Urine:6750] Intake/Output this shift: Total I/O In: 480 [P.O.:480] Out: 900 [Urine:900]  Lab Results: Recent Labs    10/01/24 0505 10/02/24 0545 10/03/24 0314  WBC 15.0* 11.7* 11.3*  HGB 11.3* 9.9* 9.5*  HCT 32.9* 28.6* 27.8*  PLT 388 344 322   BMET Recent Labs    10/01/24 0505 10/02/24 0545 10/03/24 0314  NA 129* 128* 132*  K 3.8 3.6 3.3*  CL 101 95* 100  CO2 22 21* 22  GLUCOSE 120* 165* 112*  BUN 29* 30* 34*  CREATININE 1.11 1.08 1.37*  CALCIUM  7.8* 7.3* 7.7*   LFT Recent Labs    10/03/24 0314  PROT 5.3*  ALBUMIN 2.4*  AST 21  ALT 42  ALKPHOS 91  BILITOT 1.0   PT/INR No results for input(s): LABPROT, INR in the last 72 hours.  Studies/Results: IR THORACENTESIS ASP PLEURAL SPACE W/IMG GUIDE Result Date: 10/02/2024 INDICATION: 83 year old male with history of heart failure, previous imaging showed right pleural effusion. Request for therapeutic thoracentesis. EXAM: ULTRASOUND GUIDED RIGHT THORACENTESIS MEDICATIONS: 5 mL 1%  lidocaine  with epinephrine  COMPLICATIONS: None immediate. PROCEDURE: An ultrasound guided thoracentesis was thoroughly discussed with the patient and questions answered. The benefits, risks, alternatives and complications were also discussed. The patient understands and wishes to proceed with the procedure. Written consent was obtained. Ultrasound was performed to localize and mark an adequate pocket of fluid in the right chest. The area was then prepped and draped in the normal sterile fashion. 1% Lidocaine  was used for local anesthesia. Under ultrasound guidance a 6 Fr Safe-T-Centesis catheter was introduced. Thoracentesis was performed. The catheter was removed and a dressing applied. FINDINGS: A total of approximately 1.4 L of hazy amber fluid was removed. Post procedure chest X-ray reviewed, negative for pneumothorax. IMPRESSION: Successful ultrasound guided right thoracentesis yielding 1.4 L of pleural fluid. Performed by: Aimee Han, PA-C. Electronically Signed   By: Wilkie Lent M.D.   On: 10/02/2024 12:24   DG Chest 1 View Result Date: 10/02/2024 CLINICAL DATA:  Left thoracentesis. EXAM: CHEST  1 VIEW COMPARISON:  10/01/2024 FINDINGS: Low volume film. No pneumothorax. The cardio pericardial silhouette is enlarged. Retrocardiac atelectasis or infiltrate noted. No substantial pleural effusion after thoracentesis. Left PICC line tip is positioned over the mid SVC level. No evidence for pneumothorax. IMPRESSION: 1. No evidence for pneumothorax after thoracentesis. 2. Retrocardiac atelectasis or infiltrate. Electronically Signed   By: Camellia Candle M.D.   On: 10/02/2024 11:38  Assessment / Plan:    83 year old male here with the following:  Choledocholithiasis status post cholecystectomy, bile leak Post ERCP with common bile duct stenting for bile leak Melena Anemia Chronic atrial fibrillation with RVR, on heparin  drip and amiodarone   The patient had an ERCP December 5 with removal of  stones from the CBD, bile leak status post common bile duct stenting.  Sphincterotomy was performed as part of this procedure.  He reports dark stools (black) since the exam.  His hemoglobin has drifted from tens to mid nines, BUN has risen from normal to 34.  He has been on a heparin  drip since the procedure.  Quite possible he has a post sphincterotomy bleed in the setting of heparin  drip, although appears to be slow. Recommend holding heparin  drip this evening.  Will switch to clear liquid diet and start IV Protonix  40 mg twice daily this evening.  He otherwise appears stable at this time.  Possible he may resolve this by just holding heparin  drip at this time however given need to resume anticoagulation, EGD reasonable to further evaluate.  I discussed EGD, risk benefits of the procedure and anesthesia with him and his son, questions answered, they are willing to proceed if needed  Will make n.p.o. after midnight, consider EGD tomorrow pending his course.  I will discuss his case with my advanced endoscopy colleagues, would ideally perform his case with side-viewing endoscope to look at the ampulla.  Will follow closely, call with questions or changes in his status in the interim.  This encounter included complex medical decision making and included time reviewing the chart, face-to-face time with the patient, and documenting the encounter. The patient was provided an opportunity to ask questions and all were answered.   Marcey Naval, MD [facility]

## 2024-10-03 NOTE — Progress Notes (Signed)
 PHARMACY - ANTICOAGULATION CONSULT NOTE  Pharmacy Consult for Heparin  Indication: atrial fibrillation  Allergies  Allergen Reactions   Amoxicillin Hives and Other (See Comments)    REACTION: whelps/pain in knees and ankles   Penicillin G     Other reaction(s): hives    Patient Measurements: Height: 6' 2 (188 cm) Weight: 109.7 kg (241 lb 14.4 oz) IBW/kg (Calculated) : 82.2 HEPARIN  DW (KG): 99.8  Vital Signs: Temp: 98 F (36.7 C) (12/09 0834) Temp Source: Oral (12/09 0834) BP: 118/87 (12/09 0834) Pulse Rate: 106 (12/09 0834)  Labs: Recent Labs    10/01/24 0505 10/01/24 0832 10/02/24 0545 10/02/24 0849 10/03/24 0314  HGB 11.3*  --  9.9*  --  9.5*  HCT 32.9*  --  28.6*  --  27.8*  PLT 388  --  344  --  322  APTT  --    < > >200* 68* 81*  HEPARINUNFRC 0.62  --  >1.10* 0.31 0.29*  CREATININE 1.11  --  1.08  --  1.37*   < > = values in this interval not displayed.    Estimated Creatinine Clearance: 54.8 mL/min (A) (by C-G formula based on SCr of 1.37 mg/dL (H)).  Assessment: 37 yoM on apixaban  PTA for afib, last dose 12/3 AM. Apixaban  held for ERCP.  Pharmacy consulted for heparin .  Planning to use aPTTs for heparin  monitoring while heparin  levels are expected to be falsely high from recent apixaban  doses.  aPTT therapeutic, heparin  level slightly below goal and now correlating. CBC low but stable, dark stool noted yesterday, no other S/Sx bleeding and continuing heparin  infusion for now per MD. Will stop aPTT checks and drop heparin  level to lower therapeutic goal.  Goal of Therapy:  Heparin  level 0.3-0.5 units/ml Monitor platelets by anticoagulation protocol: Yes   Plan:  Increase heparin  to 2150 units/h Daily heparin  level and CBC  Ozell Jamaica, PharmD, BCPS, [facility] Clinical Pharmacist [phone] Please check AMION for all [facility] Pharmacy numbers 10/03/2024

## 2024-10-03 NOTE — Plan of Care (Signed)
  Problem: Education: Goal: Knowledge of General Education information will improve Description: Including pain rating scale, medication(s)/side effects and non-pharmacologic comfort measures Outcome: Progressing   Problem: Health Behavior/Discharge Planning: Goal: Ability to manage health-related needs will improve Outcome: Progressing   Problem: Clinical Measurements: Goal: Ability to maintain clinical measurements within normal limits will improve Outcome: Progressing Goal: Will remain free from infection Outcome: Progressing Goal: Diagnostic test results will improve Outcome: Progressing Goal: Respiratory complications will improve Outcome: Progressing Goal: Cardiovascular complication will be avoided Outcome: Progressing   Problem: Activity: Goal: Risk for activity intolerance will decrease Outcome: Progressing   Problem: Nutrition: Goal: Adequate nutrition will be maintained Outcome: Progressing   Problem: Coping: Goal: Level of anxiety will decrease Outcome: Progressing   Problem: Elimination: Goal: Will not experience complications related to bowel motility Outcome: Progressing Goal: Will not experience complications related to urinary retention Outcome: Progressing   Problem: Pain Managment: Goal: General experience of comfort will improve and/or be controlled Outcome: Progressing   Problem: Safety: Goal: Ability to remain free from injury will improve Outcome: Progressing   Problem: Skin Integrity: Goal: Risk for impaired skin integrity will decrease Outcome: Progressing   Problem: Education: Goal: Knowledge of disease or condition will improve Outcome: Progressing Goal: Understanding of medication regimen will improve Outcome: Progressing Goal: Individualized Educational Video(s) Outcome: Progressing   Problem: Activity: Goal: Ability to tolerate increased activity will improve Outcome: Progressing   Problem: Cardiac: Goal: Ability to achieve  and maintain adequate cardiopulmonary perfusion will improve Outcome: Progressing   Problem: Health Behavior/Discharge Planning: Goal: Ability to safely manage health-related needs after discharge will improve Outcome: Progressing   Problem: Education: Goal: Knowledge of General Education information will improve Description: Including pain rating scale, medication(s)/side effects and non-pharmacologic comfort measures Outcome: Progressing   Problem: Health Behavior/Discharge Planning: Goal: Ability to manage health-related needs will improve Outcome: Progressing   Problem: Clinical Measurements: Goal: Ability to maintain clinical measurements within normal limits will improve Outcome: Progressing Goal: Will remain free from infection Outcome: Progressing Goal: Diagnostic test results will improve Outcome: Progressing Goal: Respiratory complications will improve Outcome: Progressing Goal: Cardiovascular complication will be avoided Outcome: Progressing   Problem: Activity: Goal: Risk for activity intolerance will decrease Outcome: Progressing   Problem: Nutrition: Goal: Adequate nutrition will be maintained Outcome: Progressing   Problem: Coping: Goal: Level of anxiety will decrease Outcome: Progressing   Problem: Elimination: Goal: Will not experience complications related to bowel motility Outcome: Progressing Goal: Will not experience complications related to urinary retention Outcome: Progressing   Problem: Pain Managment: Goal: General experience of comfort will improve and/or be controlled Outcome: Progressing   Problem: Safety: Goal: Ability to remain free from injury will improve Outcome: Progressing   Problem: Skin Integrity: Goal: Risk for impaired skin integrity will decrease Outcome: Progressing   Problem: Education: Goal: Knowledge of disease or condition will improve Outcome: Progressing Goal: Understanding of medication regimen will  improve Outcome: Progressing Goal: Individualized Educational Video(s) Outcome: Progressing   Problem: Activity: Goal: Ability to tolerate increased activity will improve Outcome: Progressing   Problem: Cardiac: Goal: Ability to achieve and maintain adequate cardiopulmonary perfusion will improve Outcome: Progressing   Problem: Health Behavior/Discharge Planning: Goal: Ability to safely manage health-related needs after discharge will improve Outcome: Progressing

## 2024-10-03 NOTE — Care Management Important Message (Signed)
 Important Message  Patient Details  Name: Lance Hubbard MRN: [id number] Date of Birth: 1941-07-06   Important Message Given:  Yes - Medicare IM     [name] 10/03/2024, 10:51 AM

## 2024-10-03 NOTE — Plan of Care (Signed)

## 2024-10-03 NOTE — Progress Notes (Signed)
 Occupational Therapy Treatment Patient Details Name: Lance Hubbard MRN: [id number] DOB: 08-19-1941 Today's Date: 10/03/2024   History of present illness 83 y.o. male admitted 09/27/24 with worsening abdominal pain. Abdominal CT showing multiple gallstones, cholecystectomy changes concerning for abscess vs biliary leak; potential ileus. S/p ERCP 12/5. Course complicated by afib. Of note, s/p lap chole 11/28 for gallstone pancreatitis. Other PMH includes CHF, HTN, CML, afib.   OT comments  Pt making gradual progress towards goals. Pt agreeable to progress mobility in the room this morning. Pt able to complete two laps using RW with CGA. Pt continues to report decreased flexibility in ability to manage LB ADLs. Pt's wife present and confirms she can provide light assist for ADLs as needed. Pt agreeable to attempt mobility out of room next time. Continue to recommend HHOT at DC.  SpO2 99-100% on RA, 2/4 DOE with activity. HR 90s at rest, 120s with activity      If plan is discharge home, recommend the following:  A little help with bathing/dressing/bathroom;Assistance with cooking/housework;Help with stairs or ramp for entrance   Equipment Recommendations  None recommended by OT (spouse reports they have a RW at home)    Recommendations for Other Services      Precautions / Restrictions Precautions Precautions: Fall;Other (comment) Precaution/Restrictions Comments: watch HR; abdominal prec for comfort Restrictions Weight Bearing Restrictions Per Provider Order: No       Mobility Bed Mobility Overal bed mobility: Modified Independent Bed Mobility: Supine to Sit                Transfers Overall transfer level: Needs assistance Equipment used: Rolling walker (2 wheels) Transfers: Sit to/from Stand Sit to Stand: Contact guard assist, From elevated surface           General transfer comment: slightly elevated bed. pt tendency to pull from RW rather than push from bed      Balance Overall balance assessment: Needs assistance Sitting-balance support: No upper extremity supported, Feet supported Sitting balance-Leahy Scale: Good     Standing balance support: Bilateral upper extremity supported, During functional activity Standing balance-Leahy Scale: Poor                             ADL either performed or assessed with clinical judgement   ADL Overall ADL's : Needs assistance/impaired                     Lower Body Dressing: Moderate assistance;Sitting/lateral leans;Sit to/from stand Lower Body Dressing Details (indicate cue type and reason): continues with difficulty in reaching B feet. Wife reports she can assist with these tasks if needed at home             Functional mobility during ADLs: Contact guard assist;Rolling walker (2 wheels) General ADL Comments: Pt agreeable for progressive in room mobility using RW. Pt able to manage 2 laps bed <> door using RW with CGA. 2/4 DOE noted but SpO2 WFL on RA.    Extremity/Trunk Assessment Upper Extremity Assessment Upper Extremity Assessment: Generalized weakness;Right hand dominant   Lower Extremity Assessment Lower Extremity Assessment: Defer to PT evaluation        Vision   Vision Assessment?: No apparent visual deficits   Perception     Praxis     Communication Communication Communication: Impaired   Cognition Arousal: Alert Behavior During Therapy: WFL for tasks assessed/performed Cognition: No apparent impairments  Following commands: Intact        Cueing   Cueing Techniques: Verbal cues  Exercises      Shoulder Instructions       General Comments      Pertinent Vitals/ Pain       Pain Assessment Pain Assessment: No/denies pain  Home Living                                          Prior Functioning/Environment              Frequency  Min 2X/week        Progress Toward  Goals  OT Goals(current goals can now be found in the care plan section)  Progress towards OT goals: Progressing toward goals  Acute Rehab OT Goals Patient Stated Goal: home soon, gradually improve strength OT Goal Formulation: With patient Time For Goal Achievement: 10/15/24 Potential to Achieve Goals: Good ADL Goals Pt Will Perform Lower Body Bathing: with set-up;sit to/from stand;sitting/lateral leans Pt Will Perform Lower Body Dressing: with set-up;sit to/from stand;sitting/lateral leans Pt Will Transfer to Toilet: with supervision;ambulating  Plan      Co-evaluation                 AM-PAC OT 6 Clicks Daily Activity     Outcome Measure   Help from another person eating meals?: None Help from another person taking care of personal grooming?: A Little Help from another person toileting, which includes using toliet, bedpan, or urinal?: A Lot Help from another person bathing (including washing, rinsing, drying)?: A Little Help from another person to put on and taking off regular upper body clothing?: A Little Help from another person to put on and taking off regular lower body clothing?: A Lot 6 Click Score: 17    End of Session Equipment Utilized During Treatment: Rolling walker (2 wheels);Gait belt  OT Visit Diagnosis: Unsteadiness on feet (R26.81);Other abnormalities of gait and mobility (R26.89);Muscle weakness (generalized) (M62.81)   Activity Tolerance Patient tolerated treatment well   Patient Left in chair;with call bell/phone within reach;with family/visitor present   Nurse Communication          Time: 9188-9169 OT Time Calculation (min): 19 min  Charges: OT General Charges $OT Visit: 1 Visit OT Treatments $Therapeutic Activity: 8-22 mins  Mliss NOVAK, OTR/L Acute Rehab Services Office: [phone]   Mliss Fish 10/03/2024, 9:24 AM

## 2024-10-04 ENCOUNTER — Inpatient Hospital Stay (HOSPITAL_COMMUNITY)

## 2024-10-04 ENCOUNTER — Encounter (HOSPITAL_COMMUNITY): Payer: Self-pay | Admitting: Family Medicine

## 2024-10-04 ENCOUNTER — Encounter: Payer: Self-pay | Attending: Internal Medicine

## 2024-10-04 ENCOUNTER — Other Ambulatory Visit: Payer: Self-pay

## 2024-10-04 DIAGNOSIS — I2510 Atherosclerotic heart disease of native coronary artery without angina pectoris: Secondary | ICD-10-CM

## 2024-10-04 DIAGNOSIS — D62 Acute posthemorrhagic anemia: Secondary | ICD-10-CM

## 2024-10-04 DIAGNOSIS — K2980 Duodenitis without bleeding: Secondary | ICD-10-CM

## 2024-10-04 DIAGNOSIS — I4891 Unspecified atrial fibrillation: Secondary | ICD-10-CM

## 2024-10-04 DIAGNOSIS — K2289 Other specified disease of esophagus: Secondary | ICD-10-CM

## 2024-10-04 DIAGNOSIS — Z87891 Personal history of nicotine dependence: Secondary | ICD-10-CM | POA: Diagnosis not present

## 2024-10-04 DIAGNOSIS — T85528A Displacement of other gastrointestinal prosthetic devices, implants and grafts, initial encounter: Secondary | ICD-10-CM

## 2024-10-04 DIAGNOSIS — Z9889 Other specified postprocedural states: Secondary | ICD-10-CM

## 2024-10-04 DIAGNOSIS — I10 Essential (primary) hypertension: Secondary | ICD-10-CM | POA: Diagnosis not present

## 2024-10-04 DIAGNOSIS — K839 Disease of biliary tract, unspecified: Secondary | ICD-10-CM | POA: Diagnosis not present

## 2024-10-04 DIAGNOSIS — I5023 Acute on chronic systolic (congestive) heart failure: Secondary | ICD-10-CM

## 2024-10-04 DIAGNOSIS — K2970 Gastritis, unspecified, without bleeding: Secondary | ICD-10-CM

## 2024-10-04 DIAGNOSIS — K8050 Calculus of bile duct without cholangitis or cholecystitis without obstruction: Secondary | ICD-10-CM

## 2024-10-04 DIAGNOSIS — K449 Diaphragmatic hernia without obstruction or gangrene: Secondary | ICD-10-CM | POA: Diagnosis not present

## 2024-10-04 DIAGNOSIS — I159 Secondary hypertension, unspecified: Secondary | ICD-10-CM

## 2024-10-04 DIAGNOSIS — Z4659 Encounter for fitting and adjustment of other gastrointestinal appliance and device: Secondary | ICD-10-CM

## 2024-10-04 DIAGNOSIS — I480 Paroxysmal atrial fibrillation: Secondary | ICD-10-CM

## 2024-10-04 DIAGNOSIS — K921 Melena: Secondary | ICD-10-CM | POA: Diagnosis not present

## 2024-10-04 DIAGNOSIS — E039 Hypothyroidism, unspecified: Secondary | ICD-10-CM

## 2024-10-04 DIAGNOSIS — K851 Biliary acute pancreatitis without necrosis or infection: Secondary | ICD-10-CM

## 2024-10-04 HISTORY — PX: ESOPHAGOGASTRODUODENOSCOPY: SHX5428

## 2024-10-04 HISTORY — PX: FOREIGN BODY REMOVAL: SHX962

## 2024-10-04 LAB — CBC WITH DIFFERENTIAL/PLATELET
Abs Immature Granulocytes: 0.32 K/uL — ABNORMAL HIGH (ref 0.00–0.07)
Basophils Absolute: 0.1 K/uL (ref 0.0–0.1)
Basophils Relative: 0 %
Eosinophils Absolute: 0.3 K/uL (ref 0.0–0.5)
Eosinophils Relative: 2 %
HCT: 26.6 % — ABNORMAL LOW (ref 39.0–52.0)
Hemoglobin: 9 g/dL — ABNORMAL LOW (ref 13.0–17.0)
Immature Granulocytes: 2 %
Lymphocytes Relative: 13 %
Lymphs Abs: 2.1 K/uL (ref 0.7–4.0)
MCH: 29.3 pg (ref 26.0–34.0)
MCHC: 33.8 g/dL (ref 30.0–36.0)
MCV: 86.6 fL (ref 80.0–100.0)
Monocytes Absolute: 1.4 K/uL — ABNORMAL HIGH (ref 0.1–1.0)
Monocytes Relative: 9 %
Neutro Abs: 11.9 K/uL — ABNORMAL HIGH (ref 1.7–7.7)
Neutrophils Relative %: 74 %
Platelets: 437 K/uL — ABNORMAL HIGH (ref 150–400)
RBC: 3.07 MIL/uL — ABNORMAL LOW (ref 4.22–5.81)
RDW: 14.6 % (ref 11.5–15.5)
WBC: 16.1 K/uL — ABNORMAL HIGH (ref 4.0–10.5)
nRBC: 0 % (ref 0.0–0.2)

## 2024-10-04 LAB — BASIC METABOLIC PANEL WITH GFR
Anion gap: 6 (ref 5–15)
BUN: 30 mg/dL — ABNORMAL HIGH (ref 8–23)
CO2: 27 mmol/L (ref 22–32)
Calcium: 8.1 mg/dL — ABNORMAL LOW (ref 8.9–10.3)
Chloride: 101 mmol/L (ref 98–111)
Creatinine, Ser: 1.09 mg/dL (ref 0.61–1.24)
GFR, Estimated: >60 mL/min (ref >60)
Glucose, Bld: 129 mg/dL — ABNORMAL HIGH (ref 70–99)
Potassium: 4.1 mmol/L (ref 3.5–5.1)
Sodium: 134 mmol/L — ABNORMAL LOW (ref 135–145)

## 2024-10-04 LAB — MAGNESIUM: Magnesium: 2 mg/dL (ref 1.7–2.4)

## 2024-10-04 LAB — HEPARIN LEVEL (UNFRACTIONATED): Heparin Unfractionated: <0.1 [IU]/mL — ABNORMAL LOW (ref 0.30–0.70)

## 2024-10-04 SURGERY — EGD (ESOPHAGOGASTRODUODENOSCOPY)
Anesthesia: Monitor Anesthesia Care

## 2024-10-04 MED ORDER — EPINEPHRINE 1 MG/10ML IV SOSY
PREFILLED_SYRINGE | INTRAVENOUS | Status: AC
  Filled 2024-10-04: qty 10

## 2024-10-04 MED ORDER — SODIUM CHLORIDE 0.9 % IV SOLN
2.0000 g | Freq: Three times a day (TID) | INTRAVENOUS | Status: DC
  Administered 2024-10-04 – 2024-10-05 (×3): 2 g via INTRAVENOUS
  Filled 2024-10-04 (×4): qty 12.5

## 2024-10-04 MED ORDER — PROPOFOL 10 MG/ML IV BOLUS
INTRAVENOUS | Status: DC | PRN
  Administered 2024-10-04 (×2): 30 mg via INTRAVENOUS

## 2024-10-04 MED ORDER — LIDOCAINE 2% (20 MG/ML) 5 ML SYRINGE
INTRAMUSCULAR | Status: DC | PRN
  Administered 2024-10-04: 100 mg via INTRAVENOUS

## 2024-10-04 MED ORDER — EPHEDRINE SULFATE (PRESSORS) 25 MG/5ML IV SOSY
PREFILLED_SYRINGE | INTRAVENOUS | Status: DC | PRN
  Administered 2024-10-04: 10 mg via INTRAVENOUS

## 2024-10-04 MED ORDER — PHENYLEPHRINE HCL-NACL 20-0.9 MG/250ML-% IV SOLN
INTRAVENOUS | Status: DC | PRN
  Administered 2024-10-04: 35 ug/min via INTRAVENOUS

## 2024-10-04 MED ORDER — SODIUM CHLORIDE 0.9 % IV SOLN
INTRAVENOUS | Status: AC | PRN
  Administered 2024-10-04: 500 mL via INTRAMUSCULAR

## 2024-10-04 MED ORDER — PROPOFOL 500 MG/50ML IV EMUL
INTRAVENOUS | Status: DC | PRN
  Administered 2024-10-04: 135 ug/kg/min via INTRAVENOUS

## 2024-10-04 NOTE — Assessment & Plan Note (Addendum)
 SP stone extraction by ERCP, noted bile leak and stents placed Sepsis present on admission, now resolved.   12/10 EGD with no active bleeding, pancreatic stent was pulled.  Patient will need repeat ERCP in 2 months for assessment of the bile leak and pulling common bile duct stent if it has healed.   Patient is tolerating po well, has no fever and wbc is 10.8 Will discontinue antibiotic therapy, he had 12 days of effective antibiotic therapy.   Continue with pantoprazole  to po   Acute post operative anemia, follow up hgb at 8.5  No current indication for transfusion.

## 2024-10-04 NOTE — Progress Notes (Addendum)
 Progress Note  Patient Name: Lance Hubbard Date of Encounter: 10/04/2024 [facility] Cardiologist: Wilbert Bihari, MD   Interval Summary   Doing okay today. Reports being hungry, denied shortness of breath, palpitations, abdominal distention or peripheral edema  Vital Signs Vitals:   10/03/24 2015 10/03/24 2028 10/03/24 2345 10/04/24 0517  BP:  100/67 90/60 100/68  Pulse:  (!) 104 (!) 103 (!) 109  Resp:  19 16 18   Temp: 98.1 F (36.7 C) 98.1 F (36.7 C) 98 F (36.7 C) 98 F (36.7 C)  TempSrc:  Oral Oral Oral  SpO2:  100% 99% 99%  Weight:    108.7 kg  Height:        Intake/Output Summary (Last 24 hours) at 10/04/2024 0810 Last data filed at 10/04/2024 0500 Gross per 24 hour  Intake 791.66 ml  Output 1600 ml  Net -808.34 ml      10/04/2024    5:17 AM 10/03/2024    6:29 AM 09/29/2024    2:25 PM  Last 3 Weights  Weight (lbs) 239 lb 11.2 oz 241 lb 14.4 oz 220 lb 0.3 oz  Weight (kg) 108.727 kg 109.725 kg 99.8 kg      Telemetry/ECG  AF HR ~105 - Personally Reviewed  Physical Exam  GEN: No acute distress.   Neck: JVD Cardiac: Irregularly, irregular, no murmur.  Respiratory: Clear to auscultation bilaterally. GI: Soft, nontender, non-distended  MS: No edema  Assessment & Plan  Patient with past medical history significant for atrial fibrillation status post PVI 05/2021, hypothyroidism, hypertension, CML, hyperlipidemia, tobacco use. Patient admitted for sepsis secondary to biliary pancreatitis status post cholecystectomy. Cardiology following for recurrence of atrial fibrillation and acute HFrEF.   Atrial fibrillation with episode of RVR Currently in AF  Chad Vasc score 3 Keep K > 4 and Mag > 2   Plan is to pursue DCCV, most likely outpatient once patient is not requiring procedures and hgb is stable.    IV heparin  on hold 2/2 acute anemia see below Continue Toprol  50 mg daily Continue IV amiodarone    Anemia  Hgb today 9, still down trending.   Patient reported dark stools.   IV heparin  has been stopped. GI following and planning to pursue EGD today Per primary and GI  Acute on chronic HFrEF  Hypoalbuminemia [2.4] Patient reports improvement in shortness of breath  IV diuresis which was reduced yesterday with improvement in renal function  Net IO Since Admission: -1,197.41 mL [10/04/24 0811] Weight is up from admission +18lbs On exam, patient still appears volume up though could be confounded by hypoalbuminemia.    Continue IV lasix  80 mg Continue Toprol  50 mg  Hypotension has limited GDMT, PTA entresto  on hold. Would continue to hold to allow BP room for rate control.    Pleural effusion  RT thoracentesis on 12/8 with 1.4L of hazy amber fluid removed. Not sent for labs.   Hyperlipidemia Continue crestor  5 mg    Per primary Sepsis likely choledocholithiasis, biliary leak Elevated LFTs- improving Intra-abdominal fluid collection CML Hypothyroidism AKI   For questions or updates, please contact [facility] Please consult [email or web address] for contact info under       Signed, [name], PA-C   Patient seen and examined.  Agree with above documentation.  On exam, patient is alert and oriented, irregular rhythm, tachycardic, no murmurs, diminished breath sounds, 1+ edema, + JVD.  Net -800 cc yesterday, creatinine improved (1.4 > 1.1).  Noted to have black  stools, heparin  drip held and GI planning EGD today.  Remains volume overloaded on exam, will continue IV Lasix   [name], MD

## 2024-10-04 NOTE — Telephone Encounter (Signed)
 Attempted to reach the pt and no answer- voice mail not set up.

## 2024-10-04 NOTE — Op Note (Signed)
 [facility] Patient Name: Lance Hubbard Procedure Date : 10/04/2024 MRN: [id number] Attending MD: Aloha Finner , MD, [id number] Date of Birth: 1941-06-08 CSN: [id number] Age: 83 Admit Type: Inpatient Procedure:                Upper GI endoscopy Indications:              Acute post hemorrhagic anemia, Melena, Occult blood                            in stool, s/p recent ERCP fistulotomy Providers:                Aloha Finner, MD, Elspeth SQUIBB. Leigh, MD,                            Mliss Eagles, RN, Corene Southgate, Technician Referring MD:              Medicines:                Monitored Anesthesia Care Complications:            No immediate complications. Estimated Blood Loss:     Estimated blood loss was minimal. Estimated blood                            loss: none. Procedure:                Pre-Anesthesia Assessment:                           - Prior to the procedure, a History and Physical                            was performed, and patient medications and                            allergies were reviewed. The patient's tolerance of                            previous anesthesia was also reviewed. The risks                            and benefits of the procedure and the sedation                            options and risks were discussed with the patient.                            All questions were answered, and informed consent                            was obtained. Prior Anticoagulants: The patient has                            taken heparin , last dose was 1 day prior to  procedure. ASA Grade Assessment: III - A patient                            with severe systemic disease. After reviewing the                            risks and benefits, the patient was deemed in                            satisfactory condition to undergo the procedure.                           After obtaining informed consent, the endoscope was                             passed under direct vision. Throughout the                            procedure, the patient's blood pressure, pulse, and                            oxygen saturations were monitored continuously. The                            [id number]-[id number] ([id number]) Olympus endoscope was introduced                            through the mouth, and advanced to the second part                            of duodenum. The [id number]-[id number] ([id number]) Olympus                            duodenoscope was introduced through the mouth, and                            advanced to the area of papilla. The upper GI                            endoscopy was accomplished without difficulty. The                            patient tolerated the procedure. Scope In: Scope Out: Findings:      No gross lesions were noted in the entire esophagus.      The Z-line was irregular.      A 3 cm hiatal hernia was present.      Patchy mild inflammation characterized by erythema was found in the       entire examined stomach.      Patchy mild inflammation characterized by congestion (edema), erythema       and friability was found in the duodenal bulb, in the first portion of       the duodenum and in the second portion of the duodenum.      A previously placed  plastic biliary stent was seen at the major papilla.      A previously placed plastic pancreatic stent was seen at the major       papilla. Stent removal was accomplished with a regular forceps.      There was evidence of a patent sphincterotomy in the major papilla. This       was characterized by congestion and ulceration. Impression:               - No gross lesions in the entire esophagus. Z-line                            irregular.                           - 3 cm hiatal hernia.                           - Gastritis.                           - Duodenitis in visualized in the proximal                            duodenum..                           - Plastic  biliary stent at the major papilla.                           - Plastic pancreatic stent at the major papilla.                            Removed.                           - Patent sphincterotomy, characterized by                            congestion and ulceration was found. No evidence of                            a visible vessel or spot that would require any                            additional treatment at this time. Recommendation:           - The patient will be observed post-procedure,                            until all discharge criteria are met.                           - Return patient to hospital ward for ongoing care.                           - Advance diet as tolerated.                           -  Continue PPI BID.                           - Sphincterotomy site is in healing process without                            VV that requires additional treatment at this time.                            If able to hold Heparin  for at lest 24-48 more                            hours we will likely have less issues moving                            forward.                           - Continue present medications.                           - Plan for ERCP in 2-3 months for bile leak                            followup.                           - The findings and recommendations were discussed                            with the patient.                           - The findings and recommendations were discussed                            with the referring physician. Procedure Code(s):        --- Professional ---                           [phone], Esophagogastroduodenoscopy, flexible,                            transoral; with removal of foreign body(s) Diagnosis Code(s):        --- Professional ---                           K22.89, Other specified disease of esophagus                           K29.70, Gastritis, unspecified, without bleeding                            K29.80, Duodenitis without bleeding                           Z46.59,  Encounter for fitting and adjustment of                            other gastrointestinal appliance and device                           Z98.890, Other specified postprocedural states                           D62, Acute posthemorrhagic anemia                           K92.1, Melena (includes Hematochezia)                           R19.5, Other fecal abnormalities CPT copyright 2022 [employer]. All rights reserved. The codes documented in this report are preliminary and upon coder review may  be revised to meet current compliance requirements. Aloha Finner, MD 10/04/2024 3:45:27 PM Elspeth SQUIBB. Leigh, MD Number of Addenda: 0

## 2024-10-04 NOTE — Telephone Encounter (Signed)
 ERCP scheduled, pt instructed and medications reviewed.  Patient instructions mailed to home.  Patient to call with any questions or concerns.   The pt has also been advised to come in for xray in 2 weeks.

## 2024-10-04 NOTE — Plan of Care (Signed)

## 2024-10-04 NOTE — Assessment & Plan Note (Addendum)
 Limited echocardiogram with reduced LV systolic function 30 to 35%, global hypokinesis, RV systolic function preserved. LA with moderate dilation,   Urine output is 2,925 ml Systolic blood pressure 102 mmHg range.   Continue with IV furosemide  80 mg IV daily. Continue with spironolactone  for RAAS inhibition. Metoprolol  succinate  Acute cardiogenic pulmonary edema, with right pleural effusion.  Chest radiograph 12/12 with positive cardiomegaly, bilateral hilar vascular congestion and small right pleural effusion.  Today 02 saturation is 99% on room air.

## 2024-10-04 NOTE — Assessment & Plan Note (Addendum)
 Hyponatremia, hypokalemia.   Today renal function with serum cr at 1,33 with K at 3,9 and serum bicarbonate at 29  Na 138 and Mg 2.1   Plan to continue diuresis and follow up renal function and electrolytes in am.

## 2024-10-04 NOTE — Interval H&P Note (Signed)
 History and Physical Interval Note:  10/04/2024 1:04 PM  Lance Hubbard  has presented today for surgery, with the diagnosis of Upper gastrointestinal bleed.  The various methods of treatment have been discussed with the patient and family. After consideration of risks, benefits and other options for treatment, the patient has consented to  Procedure(s): EGD (ESOPHAGOGASTRODUODENOSCOPY) (N/A) as a surgical intervention.  The patient's history has been reviewed, patient examined, no change in status, stable for surgery.  I have reviewed the patient's chart and labs.  Questions were answered to the patient's satisfaction.    EGD and possible ERCP depending on things.  The risks of an ERCP were discussed at length, including but not limited to the risk of perforation, bleeding, abdominal pain, post-ERCP pancreatitis (while usually mild can be severe and even life threatening).  The risks and benefits of endoscopic evaluation/treatment were discussed with the patient and/or family; these include but are not limited to the risk of perforation, infection, bleeding, missed lesions, lack of diagnosis, severe illness requiring hospitalization, as well as anesthesia and sedation related illnesses.  The patient's history has been reviewed, patient examined, no change in status, and deemed stable for procedure.  The patient and/or family was provided an opportunity to ask questions and all were answered.  The patient and/or family is agreeable to proceed.   [name]

## 2024-10-04 NOTE — Hospital Course (Addendum)
 Mr. Hanway was admitted to the hospital with the working diagnosis of choledocholithiasis sp cholecystectomy, complicated with bilary leak.   83 y.o. male with medical history significant for hypertension, chronic atrial fibrillation, CML and osteoarthritis, who presented to the ER with acute onset of severe upper abdominal pain with associated hypotension.  11/28 Laparoscopic cholecystectomy. At home patient having poor appetite, and persistent abdominal pain. On the day of admission he had severe pain after having breakfast, epigastric and radiated to the right upper quadrant. Because severe symptoms EMS was called, he was found in atrial fibrillation with RVR at 130 bpm, he received IV fentanyl , IV fluids 500 cc and was transported to the ED.  On his initial physical examination his blood pressure was 118/77, HR 171, RR 26.  Lungs with no wheezing or rhonchi, heart with S1 and S2 present, tachycardic, irregularly irregular with no gallops, rubs or murmurs, respiratory with no wheezing or rhonchi, abdomen with tenderness at the epigastrium and right upper quadrant, with no rebound or guarding, no lower extremity edema.   Labs revealed lipase 146 AST 69 ALT 102, total bili of 3.9 up from 1.4, WBC 16.8.  EKG showed atrial fibrillation with RVR, PVCs and low voltage QRS.    CT abdomen pelvis showed 2 stones in the distal CBD, with third gallstone within the duodenum all measuring about 6 mm, noted interval change of cholecystectomy with a 6.7 x 2.1 cm fluid collection in the gallbladder fossa, adjacent fat stranding, and fluid tracking inferiorly in the pericolic gutter. Findings may be postoperative however are concerning for developing abscess vs concern for biliary leak, HIDA scan is recommended for further evaluation. Moderate right pleural effusion and atelectasis or pneumonia in the right lower lobe, moderate to large colonic stool burden.  Cardiology, GI and general surgery consulted.  Patient  admitted for further management.   Patient was placed on antibiotic therapy.  12/05 ERCP with choledocholithiasis, underwent complete removal accomplished by biliary sphincterotomy. Bile leak was found. One temporary plastic pancreatic stent was placed into the ventral pancreatic duct. Biliary tree was swept.  One plastic biliary stent was placed into the common bile duct.   12/10 persistent anemia, underwent EGD, with no signs of acute bleeding. 12/11 changed antibiotic therapy to po.  12/12 resume heparin  IV for anticoagulation  12/13 transition to po apixaban , and po pantoprazole .

## 2024-10-04 NOTE — Assessment & Plan Note (Signed)
No chest pain, no acute coronary syndrome.  

## 2024-10-04 NOTE — Assessment & Plan Note (Addendum)
 Continue atrial fibrillation rhythm.  Continue with metoprolol  and amiodarone  (transitioned from IV to po, 200 mg bid)   Today transition from IV heparin  to apixaban .

## 2024-10-04 NOTE — Telephone Encounter (Signed)
 ERCP has been set up for 11/30/24 at [facility] with GM at 8 am   Eliquis  letter sent to Dr Levern

## 2024-10-04 NOTE — H&P (View-Only) (Signed)
° ° ° °   Progress Note   Subjective  Patient had a black stool this AM. Endorses some R flank pain. Otherwise no complaints. HEparin  drip stopped last night. He is NPO   Objective   Vital signs in last 24 hours: Temp:  [98 F (36.7 C)-98.8 F (37.1 C)] 98 F (36.7 C) (12/10 0517) Pulse Rate:  [94-115] 109 (12/10 0517) Resp:  [16-20] 18 (12/10 0517) BP: (90-118)/(60-87) 100/68 (12/10 0517) SpO2:  [99 %-100 %] 99 % (12/10 0517) Weight:  [108.7 kg] 108.7 kg (12/10 0517) Last BM Date : 10/02/24 General:    white male in NAD Abdomen:  Soft, nontender and nondistended.  Neurologic:  Alert and oriented,  grossly normal neurologically. Psych:  Cooperative. Normal mood and affect.  Intake/Output from previous day: 12/09 0701 - 12/10 0700 In: 791.7 [P.O.:480; IV Piggyback:311.7] Out: 1600 [Urine:1600] Intake/Output this shift: No intake/output data recorded.  Lab Results: Recent Labs    10/02/24 0545 10/03/24 0314 10/04/24 0527  WBC 11.7* 11.3* 16.1*  HGB 9.9* 9.5* 9.0*  HCT 28.6* 27.8* 26.6*  PLT 344 322 437*   BMET Recent Labs    10/02/24 0545 10/03/24 0314 10/04/24 0527  NA 128* 132* 134*  K 3.6 3.3* 4.1  CL 95* 100 101  CO2 21* 22 27  GLUCOSE 165* 112* 129*  BUN 30* 34* 30*  CREATININE 1.08 1.37* 1.09  CALCIUM  7.3* 7.7* 8.1*   LFT Recent Labs    10/03/24 0314  PROT 5.3*  ALBUMIN 2.4*  AST 21  ALT 42  ALKPHOS 91  BILITOT 1.0   PT/INR No results for input(s): LABPROT, INR in the last 72 hours.  Studies/Results:     Assessment / Plan:    83 year old male here with the following:   Choledocholithiasis status post cholecystectomy, bile leak Post ERCP with common bile duct stenting for bile leak Melena Anemia Chronic atrial fibrillation with RVR, on heparin  drip and amiodarone    See yesterday's note for full details. Heparin  drip stopped last night. He passed black stool again this AM. Hgb continues to drift down, BUN up. He has been  having some slow bleeding since the ERCP based on history and labs, concern is for postsphincterotomy related bleeding in the setting of a heparin  drip.  He is otherwise stable.  Recommend EGD with side-viewing scope to further evaluate and treat endoscopically if amenable.  I discussed risk benefits of the procedure and anesthesia with him and his wife this morning, they understand and want to proceed.  He is NPO.  Continue to hold heparin  drip.  I started Protonix  40 mg twice daily yesterday.  Continue that for now.  The plan is for one of our advanced endoscopist to be available to pass side-viewing or to look at the ampulla for this exam.  Further recommendations pending the results of the endoscopy  Marcey Naval, MD [facility]

## 2024-10-04 NOTE — Anesthesia Preprocedure Evaluation (Signed)
 Anesthesia Evaluation  Patient identified by MRN, date of birth, ID band Patient awake    Reviewed: Allergy & Precautions, H&P , NPO status , Patient's Chart, lab work & pertinent test results  Airway Mallampati: II   Neck ROM: full    Dental   Pulmonary former smoker   breath sounds clear to auscultation       Cardiovascular hypertension, + dysrhythmias Atrial Fibrillation  Rhythm:regular Rate:Normal     Neuro/Psych    GI/Hepatic   Endo/Other  Hypothyroidism    Renal/GU      Musculoskeletal  (+) Arthritis ,    Abdominal   Peds  Hematology  (+) Blood dyscrasia, anemia CML   Anesthesia Other Findings   Reproductive/Obstetrics                              Anesthesia Physical Anesthesia Plan  ASA: 3  Anesthesia Plan: MAC   Post-op Pain Management:    Induction: Intravenous  PONV Risk Score and Plan: 1 and Propofol  infusion and Treatment may vary due to age or medical condition  Airway Management Planned: Nasal Cannula  Additional Equipment:   Intra-op Plan:   Post-operative Plan:   Informed Consent: I have reviewed the patients History and Physical, chart, labs and discussed the procedure including the risks, benefits and alternatives for the proposed anesthesia with the patient or authorized representative who has indicated his/her understanding and acceptance.     Dental advisory given  Plan Discussed with: CRNA, Anesthesiologist and Surgeon  Anesthesia Plan Comments:         Anesthesia Quick Evaluation

## 2024-10-04 NOTE — Assessment & Plan Note (Signed)
 Continue blood pressure monitoring  Continue metoprolol  50 mg succinate bid .

## 2024-10-04 NOTE — Progress Notes (Signed)
° ° ° °   Progress Note   Subjective  Patient had a black stool this AM. Endorses some R flank pain. Otherwise no complaints. HEparin  drip stopped last night. He is NPO   Objective   Vital signs in last 24 hours: Temp:  [98 F (36.7 C)-98.8 F (37.1 C)] 98 F (36.7 C) (12/10 0517) Pulse Rate:  [94-115] 109 (12/10 0517) Resp:  [16-20] 18 (12/10 0517) BP: (90-118)/(60-87) 100/68 (12/10 0517) SpO2:  [99 %-100 %] 99 % (12/10 0517) Weight:  [108.7 kg] 108.7 kg (12/10 0517) Last BM Date : 10/02/24 General:    white male in NAD Abdomen:  Soft, nontender and nondistended.  Neurologic:  Alert and oriented,  grossly normal neurologically. Psych:  Cooperative. Normal mood and affect.  Intake/Output from previous day: 12/09 0701 - 12/10 0700 In: 791.7 [P.O.:480; IV Piggyback:311.7] Out: 1600 [Urine:1600] Intake/Output this shift: No intake/output data recorded.  Lab Results: Recent Labs    10/02/24 0545 10/03/24 0314 10/04/24 0527  WBC 11.7* 11.3* 16.1*  HGB 9.9* 9.5* 9.0*  HCT 28.6* 27.8* 26.6*  PLT 344 322 437*   BMET Recent Labs    10/02/24 0545 10/03/24 0314 10/04/24 0527  NA 128* 132* 134*  K 3.6 3.3* 4.1  CL 95* 100 101  CO2 21* 22 27  GLUCOSE 165* 112* 129*  BUN 30* 34* 30*  CREATININE 1.08 1.37* 1.09  CALCIUM  7.3* 7.7* 8.1*   LFT Recent Labs    10/03/24 0314  PROT 5.3*  ALBUMIN 2.4*  AST 21  ALT 42  ALKPHOS 91  BILITOT 1.0   PT/INR No results for input(s): LABPROT, INR in the last 72 hours.  Studies/Results:     Assessment / Plan:    83 year old male here with the following:   Choledocholithiasis status post cholecystectomy, bile leak Post ERCP with common bile duct stenting for bile leak Melena Anemia Chronic atrial fibrillation with RVR, on heparin  drip and amiodarone    See yesterday's note for full details. Heparin  drip stopped last night. He passed black stool again this AM. Hgb continues to drift down, BUN up. He has been  having some slow bleeding since the ERCP based on history and labs, concern is for postsphincterotomy related bleeding in the setting of a heparin  drip.  He is otherwise stable.  Recommend EGD with side-viewing scope to further evaluate and treat endoscopically if amenable.  I discussed risk benefits of the procedure and anesthesia with him and his wife this morning, they understand and want to proceed.  He is NPO.  Continue to hold heparin  drip.  I started Protonix  40 mg twice daily yesterday.  Continue that for now.  The plan is for one of our advanced endoscopist to be available to pass side-viewing or to look at the ampulla for this exam.  Further recommendations pending the results of the endoscopy  Marcey Naval, MD [facility]

## 2024-10-04 NOTE — Transfer of Care (Signed)
 Immediate Anesthesia Transfer of Care Note  Patient: Lance Hubbard  Procedure(s) Performed: EGD (ESOPHAGOGASTRODUODENOSCOPY) REMOVAL, FOREIGN BODY  Patient Location: PACU; Endo  Anesthesia Type:MAC  Level of Consciousness: awake  Airway & Oxygen Therapy: Patient Spontanous Breathing and Patient connected to face mask oxygen  Post-op Assessment: Report given to RN and Post -op Vital signs reviewed and stable  Post vital signs: Reviewed and stable  Last Vitals:  Vitals Value Taken Time  BP 99/77 10/04/24 13:40  Temp 36.4 C 10/04/24 13:40  Pulse 110 10/04/24 13:42  Resp 24 10/04/24 13:42  SpO2 100 % 10/04/24 13:42  Vitals shown include unfiled device data.  Last Pain:  Vitals:   10/04/24 1340  TempSrc: Temporal  PainSc:       Patients Stated Pain Goal: 0 (10/03/24 2014)  Complications: No notable events documented.

## 2024-10-04 NOTE — Progress Notes (Addendum)
 Progress Note   Patient: Lance Hubbard FMW:[id number] DOB: 08-07-1941 DOA: 09/27/2024     6 DOS: the patient was seen and examined on 10/04/2024   Brief hospital course: Lance Hubbard was admitted to the hospital with the working diagnosis of choledocholithiasis sp cholecystectomy, complicated with bilary leak.   83 y.o. male with medical history significant for hypertension, chronic atrial fibrillation, CML and osteoarthritis, who presented to the ER with acute onset of severe upper abdominal pain with associated hypotension. Pt is s/p laparoscopic cholecystectomy on 11/28, now reporting subsequent severe epigastric pain radiating to his right lateral abdomen.  In the ED, BP was 118/77, HR 171, RR 26. Labs revealed lipase 146 AST 69 ALT 102, total bili of 3.9 up from 1.4, WBC 16.8. EKG showed atrial fibrillation with RVR, PVCs and low voltage QRS.    CT abdomen pelvis showed 2 stones in the distal CBD, with third gallstone within the duodenum all measuring about 6 mm, noted interval change of cholecystectomy with a 6.7 x 2.1 cm fluid collection in the gallbladder fossa, adjacent fat stranding, and fluid tracking inferiorly in the pericolic gutter. Findings may be postoperative however are concerning for developing abscess vs concern for biliary leak, HIDA scan is recommended for further evaluation. Moderate right pleural effusion and atelectasis or pneumonia in the right lower lobe, moderate to large colonic stool burden.  Cardiology, GI and general surgery consulted.  Patient admitted for further management.   Patient was placed on antibiotic therapy.  12/05 ERCP with choledocholithiasis, underwent complete removal accomplished by biliary sphincterotomy. Bile leak was found. One temporary plastic pancreatic stent was placed into the ventral pancreatic duct. Biliary tree was swept.  One plastic biliary stent was placed into the common bile duct.   12/10 persistent anemia, underwent EGD, with no signs  of acute bleeding.   Assessment and Plan: * Choledocholithiasis SP stone extraction by ERCP Noted bile leak and stents placed.  Possible post ERCP bleeding 12/10 EGD with no active bleeding.  Wbc 16.1  Hgb is 9,0   Plan to continue antibiotic therapy with cefepime  and metronidazole .  Holding anticoagulation.  Continue IV pantoprazole  for now.  Tolerating po well.   Acute post operative anemia.   Paroxysmal atrial fibrillation (HCC) Continue telemetry monitoring.  Holding anticoagulation due to acute anemia.  Continue with metoprolol  and amiodarone    Acute on chronic systolic CHF (congestive heart failure) (HCC) Limited echocardiogram with reduced LV systolic function 30 to 35%, global hypokinesis, RV systolic function preserved. LA with moderate dilation,   Plan to continue diuresis with IV furosemide  80 mg IV bid   HTN (hypertension) Continue blood pressure monitoring   Coronary artery calcification seen on CAT scan No chest pain, no acute coronary syndrome   AKI (acute kidney injury) Hyponatremia, hypokalemia.   Renal function with serum cr at 1,0 with K at 4,1 and serum bicarbonate at 27  Na 134 and Mg 2.0   Plan to continue diuresis and follow up renal function and electrolytes in am.   Dyslipidemia Continue statin therapy  Hypothyroidism Continue levothyroxine    CML (chronic myelocytic leukemia) (HCC) Plan to hold on bosutinib in the setting of acute infection  Apparently his wide has been giving him this medication.       Subjective: Patient with no chest pain and no dyspnea, mild abdominal pain,   Physical Exam: Vitals:   10/04/24 1340 10/04/24 1341 10/04/24 1350 10/04/24 1400  BP: 99/77 101/74 106/74 108/70  Pulse: (!) 110 (!) 105 (!)  112 (!) 114  Resp: (!) 21 13 (!) 4 18  Temp: (!) 97.5 F (36.4 C)     TempSrc: Temporal     SpO2: 100% 100% 98% 100%  Weight:      Height:       Neurology awake and alert, deconditioned ENT with mild pallor  with no icterus Cardiovascular with S1 and S2 present, irregularly irregular with no gallops, rubs or murmurs Respiratory with no rales or wheezing, no rhonchi  Abdomen with no distention, soft and non tender No lower extremity edema   Data Reviewed:    Family Communication: no family at the bedside   Disposition: Status is: Inpatient Remains inpatient appropriate because: recovering ERCP   Planned Discharge Destination: Home     Author: Elidia Toribio Furnace, MD 10/04/2024 2:36 PM  For on call review [email or web address].

## 2024-10-04 NOTE — Progress Notes (Signed)
 PHARMACY NOTE:  ANTIMICROBIAL RENAL DOSAGE ADJUSTMENT  Current antimicrobial regimen includes a mismatch between antimicrobial dosage and estimated renal function.  As per policy approved by the Pharmacy & Therapeutics and Medical Executive Committees, the antimicrobial dosage will be adjusted accordingly.  Current antimicrobial dosage:  cefepime  q12h  Indication: IAI  Renal Function:  Estimated Creatinine Clearance: 68.6 mL/min (by C-G formula based on SCr of 1.09 mg/dL). []      On intermittent HD, scheduled: []      On CRRT    Antimicrobial dosage has been changed to:  cefepime  q8h  Additional comments:   Thank you for allowing pharmacy to be a part of this patient's care.  Lance Hubbard [place], [facility] 10/04/2024 7:26 AM

## 2024-10-05 DIAGNOSIS — I4819 Other persistent atrial fibrillation: Secondary | ICD-10-CM

## 2024-10-05 DIAGNOSIS — Z7901 Long term (current) use of anticoagulants: Secondary | ICD-10-CM | POA: Diagnosis not present

## 2024-10-05 DIAGNOSIS — K91840 Postprocedural hemorrhage and hematoma of a digestive system organ or structure following a digestive system procedure: Secondary | ICD-10-CM | POA: Diagnosis not present

## 2024-10-05 DIAGNOSIS — N179 Acute kidney failure, unspecified: Secondary | ICD-10-CM

## 2024-10-05 LAB — CBC
HCT: 27.2 % — ABNORMAL LOW (ref 39.0–52.0)
Hemoglobin: 9 g/dL — ABNORMAL LOW (ref 13.0–17.0)
MCH: 28.8 pg (ref 26.0–34.0)
MCHC: 33.1 g/dL (ref 30.0–36.0)
MCV: 87.2 fL (ref 80.0–100.0)
Platelets: 520 K/uL — ABNORMAL HIGH (ref 150–400)
RBC: 3.12 MIL/uL — ABNORMAL LOW (ref 4.22–5.81)
RDW: 14.9 % (ref 11.5–15.5)
WBC: 16.5 K/uL — ABNORMAL HIGH (ref 4.0–10.5)
nRBC: 0 % (ref 0.0–0.2)

## 2024-10-05 LAB — COMPREHENSIVE METABOLIC PANEL WITH GFR
ALT: 47 U/L — ABNORMAL HIGH (ref 0–44)
AST: 31 U/L (ref 15–41)
Albumin: 2.9 g/dL — ABNORMAL LOW (ref 3.5–5.0)
Alkaline Phosphatase: 82 U/L (ref 38–126)
Anion gap: 6 (ref 5–15)
BUN: 24 mg/dL — ABNORMAL HIGH (ref 8–23)
CO2: 28 mmol/L (ref 22–32)
Calcium: 8.2 mg/dL — ABNORMAL LOW (ref 8.9–10.3)
Chloride: 100 mmol/L (ref 98–111)
Creatinine, Ser: 1.14 mg/dL (ref 0.61–1.24)
GFR, Estimated: >60 mL/min (ref >60)
Glucose, Bld: 102 mg/dL — ABNORMAL HIGH (ref 70–99)
Potassium: 4.2 mmol/L (ref 3.5–5.1)
Sodium: 134 mmol/L — ABNORMAL LOW (ref 135–145)
Total Bilirubin: 0.8 mg/dL (ref 0.0–1.2)
Total Protein: 5.9 g/dL — ABNORMAL LOW (ref 6.5–8.1)

## 2024-10-05 MED ORDER — METRONIDAZOLE 500 MG PO TABS
500.0000 mg | ORAL_TABLET | Freq: Two times a day (BID) | ORAL | Status: DC
  Administered 2024-10-05 – 2024-10-08 (×7): 500 mg via ORAL
  Filled 2024-10-05 (×7): qty 1

## 2024-10-05 MED ORDER — SPIRONOLACTONE 12.5 MG HALF TABLET
12.5000 mg | ORAL_TABLET | Freq: Every day | ORAL | Status: DC
  Administered 2024-10-05 – 2024-10-11 (×7): 12.5 mg via ORAL
  Filled 2024-10-05 (×7): qty 1

## 2024-10-05 MED ORDER — CIPROFLOXACIN HCL 500 MG PO TABS
500.0000 mg | ORAL_TABLET | Freq: Two times a day (BID) | ORAL | Status: DC
  Administered 2024-10-05 – 2024-10-08 (×7): 500 mg via ORAL
  Filled 2024-10-05 (×7): qty 1

## 2024-10-05 NOTE — Anesthesia Postprocedure Evaluation (Signed)
 Anesthesia Post Note  Patient: Lance Hubbard  Procedure(s) Performed: EGD (ESOPHAGOGASTRODUODENOSCOPY) REMOVAL, FOREIGN BODY     Patient location during evaluation: Endoscopy Anesthesia Type: MAC Level of consciousness: awake and alert Pain management: pain level controlled Vital Signs Assessment: post-procedure vital signs reviewed and stable Respiratory status: spontaneous breathing, nonlabored ventilation, respiratory function stable and patient connected to nasal cannula oxygen Cardiovascular status: stable and blood pressure returned to baseline Postop Assessment: no apparent nausea or vomiting Anesthetic complications: no   No notable events documented.  Last Vitals:  Vitals:   10/05/24 0440 10/05/24 0756  BP: 102/71 103/77  Pulse: 100 (!) 106  Resp: 16 15  Temp: 36.4 C 36.6 C  SpO2: 98% 99%    Last Pain:  Vitals:   10/05/24 0756  TempSrc: Oral  PainSc:                  Zadkiel Dragan S

## 2024-10-05 NOTE — Progress Notes (Signed)
° ° ° °   Progress Note   Subjective  Patient doing well.  He denies any further melena overnight.  EGD with side-viewing done yesterday which he tolerated well.  Labs pending from this morning.  He feels well and wants to eat.    Objective   Vital signs in last 24 hours: Temp:  [97 F (36.1 C)-97.9 F (36.6 C)] 97.9 F (36.6 C) (12/11 0756) Pulse Rate:  [98-117] 106 (12/11 0756) Resp:  [4-21] 15 (12/11 0756) BP: (99-127)/(69-87) 103/77 (12/11 0756) SpO2:  [98 %-100 %] 99 % (12/11 0756) Weight:  [107.4 kg] 107.4 kg (12/11 0440) Last BM Date : 10/03/24 General:    white male in NAD Neurologic:  Alert and oriented,  grossly normal neurologically. Psych:  Cooperative. Normal mood and affect.  Intake/Output from previous day: 12/10 0701 - 12/11 0700 In: 150 [I.V.:150] Out: 1500 [Urine:1500] Intake/Output this shift: No intake/output data recorded.  Lab Results: Recent Labs    10/03/24 0314 10/04/24 0527  WBC 11.3* 16.1*  HGB 9.5* 9.0*  HCT 27.8* 26.6*  PLT 322 437*   BMET Recent Labs    10/03/24 0314 10/04/24 0527  NA 132* 134*  K 3.3* 4.1  CL 100 101  CO2 22 27  GLUCOSE 112* 129*  BUN 34* 30*  CREATININE 1.37* 1.09  CALCIUM  7.7* 8.1*   LFT Recent Labs    10/03/24 0314  PROT 5.3*  ALBUMIN 2.4*  AST 21  ALT 42  ALKPHOS 91  BILITOT 1.0   PT/INR No results for input(s): LABPROT, INR in the last 72 hours.  Studies/Results: No results found.     Assessment / Plan:    83 year old male here with the following:  Choledocholithiasis status post cholecystectomy, bile leak Post ERCP with common bile duct stenting for bile leak Melena Anemia Chronic atrial fibrillation with RVR, on heparin  drip and amiodarone    EGD with side-viewing scope done yesterday per Dr. Wilhelmenia.  Sphincterotomy seen but no high risk stigmata for bleeding or residual blood.  He had stopped bleeding with holding heparin  itself.  He has done well overnight without any  bleeding.  We are still waiting for his labs to come back to trend his hemoglobin and his BUN.  Assuming stable/improving labs, can advance to soft diet today.  Continue IV Protonix  40 mg twice daily for now.  I would continue to hold heparin  drip for at least another 24 hours to ensure no recurrent bleeding, prior to resumption.  When resuming anticoagulation I would start with heparin  drip to make sure he tolerates it and then to longer acting anticoagulant.  Of note, pancreatic stent was pulled during the EGD yesterday so he does not need a repeat x-ray in 2 weeks.  He does need a repeat ERCP with Dr. Wilhelmenia in 2 months for reassessment of the bile leak and pulling CBD stent if it has healed.  Spoke with the patient and his wife about this and they understand.  Assuming no further bleeding and he is stable, we will sign off for now.  Call with questions in the interim.  Marcey Naval, MD [facility]

## 2024-10-05 NOTE — Progress Notes (Signed)
 Progress Note   Patient: Lance Hubbard FMW:[id number] DOB: 1941/10/16 DOA: 09/27/2024     7 DOS: the patient was seen and examined on 10/05/2024   Brief hospital course: Lance Hubbard was admitted to the hospital with the working diagnosis of choledocholithiasis sp cholecystectomy, complicated with bilary leak.   83 y.o. male with medical history significant for hypertension, chronic atrial fibrillation, CML and osteoarthritis, who presented to the ER with acute onset of severe upper abdominal pain with associated hypotension.  11/28 Laparoscopic cholecystectomy. At home patient having poor appetite, and persistent abdominal pain. On the day of admission he had severe pain after having breakfast, epigastric and radiated to the right upper quadrant. Because severe symptoms EMS was called, he was found in atrial fibrillation with RVR at 130 bpm, he received IV fentanyl , IV fluids 500 cc and was transported to the ED.  On Lance initial physical examination Lance blood pressure was 118/77, HR 171, RR 26.  Lungs with no wheezing or rhonchi, heart with S1 and S2 present, tachycardic, irregularly irregular with no gallops, rubs or murmurs, respiratory with no wheezing or rhonchi, abdomen with tenderness at the epigastrium and right upper quadrant, with no rebound or guarding, no lower extremity edema.   Labs revealed lipase 146 AST 69 ALT 102, total bili of 3.9 up from 1.4, WBC 16.8.  EKG showed atrial fibrillation with RVR, PVCs and low voltage QRS.    CT abdomen pelvis showed 2 stones in the distal CBD, with third gallstone within the duodenum all measuring about 6 mm, noted interval change of cholecystectomy with a 6.7 x 2.1 cm fluid collection in the gallbladder fossa, adjacent fat stranding, and fluid tracking inferiorly in the pericolic gutter. Findings may be postoperative however are concerning for developing abscess vs concern for biliary leak, HIDA scan is recommended for further evaluation. Moderate  right pleural effusion and atelectasis or pneumonia in the right lower lobe, moderate to large colonic stool burden.  Cardiology, GI and general surgery consulted.  Patient admitted for further management.   Patient was placed on antibiotic therapy.  12/05 ERCP with choledocholithiasis, underwent complete removal accomplished by biliary sphincterotomy. Bile leak was found. One temporary plastic pancreatic stent was placed into the ventral pancreatic duct. Biliary tree was swept.  One plastic biliary stent was placed into the common bile duct.   12/10 persistent anemia, underwent EGD, with no signs of acute bleeding. 12/11 changed antibiotic therapy to po.   Assessment and Plan: * Choledocholithiasis SP stone extraction by ERCP Sepsis present on admission, now resolved.  Noted bile leak and stents placed.  Possible post ERCP bleeding 12/10 EGD with no active bleeding.  Wbc 16.5 Hgb is 9,0   Change antibiotic therapy to ciprofloxacin  and metronidazole  Holding anticoagulation for 24 hrs more.  Change pantoprazole  to po  Tolerating po well.   Acute post operative anemia, follow up hgb at 9,0  No current indication for transfusion.   Paroxysmal atrial fibrillation (HCC) Continue telemetry monitoring.  Holding anticoagulation due to acute anemia, plan to resume in 24 hrs.  Continue with metoprolol  and amiodarone    Acute on chronic systolic CHF (congestive heart failure) (HCC) Limited echocardiogram with reduced LV systolic function 30 to 35%, global hypokinesis, RV systolic function preserved. LA with moderate dilation,   Urine output is 1500 ml Systolic blood pressure 102 mmHg range.   Plan to continue diuresis with IV furosemide  80 mg IV daily, to consider increase to bid.  Will add spironolactone for RAAS inhibition.  HTN (hypertension) Continue blood pressure monitoring   Coronary artery calcification seen on CAT scan No chest pain, no acute coronary syndrome   AKI  (acute kidney injury) Hyponatremia, hypokalemia.   Renal function today with serum cr at 1,1 with K at 4,2 and serum bicarbonate at 28  Na 134   Plan to continue diuresis and follow up renal function and electrolytes in am.   Dyslipidemia Continue statin therapy  Hypothyroidism Continue levothyroxine    CML (chronic myelocytic leukemia) (HCC) Plan to hold on bosutinib in the setting of acute infection  Apparently Lance Hubbard has been giving him this medication.       Subjective: Patient with no chest pain and no dyspnea, denies abdominal pain or palpitations.   Physical Exam: Vitals:   10/04/24 1935 10/04/24 2300 10/05/24 0440 10/05/24 0756  BP: 117/79 110/71 102/71 103/77  Pulse: (!) 106 (!) 106 100 (!) 106  Resp: 19 18 16 15   Temp: 97.8 F (36.6 C) 97.8 F (36.6 C) 97.6 F (36.4 C) 97.9 F (36.6 C)  TempSrc: Oral Oral Oral Oral  SpO2:   98% 99%  Weight:   107.4 kg   Height:       Neurology awake and alert ENT with mild pallor with no icterus Cardiovascular with S1 and S2 present, irregularly irregular with no gallops, rubs or murmurs Respiratory with mild rales at bases with no wheezing, or rhonchi Abdomen soft and not distended, not tender to superficial palpation Lower extremity edema +   Data Reviewed:    Family Communication: I spoke with patient's wife at the bedside, we talked in detail about patient's condition, plan of care and prognosis and all questions were addressed.   Disposition: Status is: Inpatient Remains inpatient appropriate because: recovering sepsis   Planned Discharge Destination: Home     Author: Elidia Toribio Furnace, MD 10/05/2024 10:23 AM  For on call review [email or web address].

## 2024-10-05 NOTE — Progress Notes (Addendum)
 Progress Note  Patient Name: Lance Hubbard Date of Encounter: 10/05/2024 [facility] Cardiologist: Wilbert Bihari, MD   Interval Summary   Reports doing okay, denied shortness of breath, chest pain, or palpitations. Reports being hungry.  Vital Signs Vitals:   10/04/24 1935 10/04/24 2300 10/05/24 0440 10/05/24 0756  BP: 117/79 110/71 102/71 103/77  Pulse: (!) 106 (!) 106 100 (!) 106  Resp: 19 18 16 15   Temp: 97.8 F (36.6 C) 97.8 F (36.6 C) 97.6 F (36.4 C) 97.9 F (36.6 C)  TempSrc: Oral Oral Oral Oral  SpO2:   98% 99%  Weight:   107.4 kg   Height:        Intake/Output Summary (Last 24 hours) at 10/05/2024 0807 Last data filed at 10/04/2024 1900 Gross per 24 hour  Intake 150 ml  Output 1500 ml  Net -1350 ml      10/05/2024    4:40 AM 10/04/2024    5:17 AM 10/03/2024    6:29 AM  Last 3 Weights  Weight (lbs) 236 lb 11.2 oz 239 lb 11.2 oz 241 lb 14.4 oz  Weight (kg) 107.366 kg 108.727 kg 109.725 kg      Telemetry/ECG  AF HR ~100 - Personally Reviewed  Physical Exam  GEN: No acute distress.   Neck: JVD Cardiac: Irregularly, irregular, no murmurs, rubs, or gallops.  Respiratory: left base diminished. GI: Soft, nontender, non-distended  MS: No edema  Assessment & Plan  Patient with past medical history significant for atrial fibrillation status post PVI 05/2021, hypothyroidism, hypertension, CML, hyperlipidemia, tobacco use. Patient admitted for sepsis secondary to biliary pancreatitis status post cholecystectomy. Cardiology following for recurrence of atrial fibrillation and acute HFrEF.    Anemia Hgb stable today IV heparin  stopped.  Patient underwent EGD which showed congestion and ulceration around sphincterotomy. Gastritis and duodenitis. Per report would like to hold IV heparin  for an additional 24-48 hours. Per primary/GI  Atrial fibrillation with episode of RVR Currently in AF HR ~100 Chad Vasc score 3 Keep K > 4 and Mag > 2   Plan is  to pursue DCCV, most likely outpatient once patient is not requiring procedures and hgb is stable.   IV heparin  on hold 2/2 acute anemia see above. Continue Toprol  50 mg daily Continue IV amiodarone    Acute on chronic HFrEF  Hypoalbuminemia [2.9] Net IO Since Admission: -2,547.41 mL [10/05/24 0817] Weight is up from admission +15lbs, though improving with IV diuresis Per EGD report notable gut edema.   On exam, appears volume up.  Continue IV lasix  80 mg  Continue Toprol  50 mg  Hypotension has limited GDMT, PTA entresto  on hold. Would continue to hold to allow BP room for rate control.    Pleural effusion  RT thoracentesis on 12/8 with 1.4L of hazy amber fluid removed. Not sent for labs.   Hyperlipidemia Continue crestor  5 mg    Per primary Sepsis likely choledocholithiasis, biliary leak Elevated LFTs- improved Intra-abdominal fluid collection CML Hypothyroidism AKI-improved   For questions or updates, please contact [facility] Please consult [email or web address] for contact info under       Signed, [name], PA-C   Patient seen and examined.  Agree with above documentation.  On exam, patient is alert and oriented, irregular rhythm, no murmurs, diminished breath sounds, 1+ lower extremity edema.  Incomplete I's/O's, recorded is net -1.4 L.  Stable creatinine at 1.1.  Hemoglobin stable at 9.0.  Heparin  drip remains on hold given GI bleed,  per GI will need to hold at least another 24 hours.  Rates in A-fib appear reasonably controlled, 90s to 110s.  Remains hypervolemic, continue IV Lasix .  [name], MD

## 2024-10-05 NOTE — Plan of Care (Signed)

## 2024-10-06 ENCOUNTER — Inpatient Hospital Stay (HOSPITAL_COMMUNITY)

## 2024-10-06 ENCOUNTER — Other Ambulatory Visit: Payer: Self-pay

## 2024-10-06 ENCOUNTER — Encounter (HOSPITAL_COMMUNITY): Payer: Self-pay | Admitting: Gastroenterology

## 2024-10-06 DIAGNOSIS — I4819 Other persistent atrial fibrillation: Secondary | ICD-10-CM | POA: Diagnosis not present

## 2024-10-06 DIAGNOSIS — Z9889 Other specified postprocedural states: Secondary | ICD-10-CM | POA: Diagnosis not present

## 2024-10-06 DIAGNOSIS — E039 Hypothyroidism, unspecified: Secondary | ICD-10-CM | POA: Diagnosis not present

## 2024-10-06 DIAGNOSIS — N179 Acute kidney failure, unspecified: Secondary | ICD-10-CM | POA: Diagnosis not present

## 2024-10-06 DIAGNOSIS — C9210 Chronic myeloid leukemia, BCR/ABL-positive, not having achieved remission: Secondary | ICD-10-CM | POA: Diagnosis not present

## 2024-10-06 DIAGNOSIS — E785 Hyperlipidemia, unspecified: Secondary | ICD-10-CM | POA: Diagnosis not present

## 2024-10-06 DIAGNOSIS — I2510 Atherosclerotic heart disease of native coronary artery without angina pectoris: Secondary | ICD-10-CM | POA: Diagnosis not present

## 2024-10-06 LAB — CBC
HCT: 25.4 % — ABNORMAL LOW (ref 39.0–52.0)
Hemoglobin: 8.5 g/dL — ABNORMAL LOW (ref 13.0–17.0)
MCH: 29.5 pg (ref 26.0–34.0)
MCHC: 33.5 g/dL (ref 30.0–36.0)
MCV: 88.2 fL (ref 80.0–100.0)
Platelets: 410 K/uL — ABNORMAL HIGH (ref 150–400)
RBC: 2.88 MIL/uL — ABNORMAL LOW (ref 4.22–5.81)
RDW: 15.1 % (ref 11.5–15.5)
WBC: 14.2 K/uL — ABNORMAL HIGH (ref 4.0–10.5)
nRBC: 0 % (ref 0.0–0.2)

## 2024-10-06 LAB — BASIC METABOLIC PANEL WITH GFR
Anion gap: 9 (ref 5–15)
BUN: 25 mg/dL — ABNORMAL HIGH (ref 8–23)
CO2: 27 mmol/L (ref 22–32)
Calcium: 8.4 mg/dL — ABNORMAL LOW (ref 8.9–10.3)
Chloride: 99 mmol/L (ref 98–111)
Creatinine, Ser: 1.09 mg/dL (ref 0.61–1.24)
GFR, Estimated: >60 mL/min (ref >60)
Glucose, Bld: 107 mg/dL — ABNORMAL HIGH (ref 70–99)
Potassium: 4 mmol/L (ref 3.5–5.1)
Sodium: 135 mmol/L (ref 135–145)

## 2024-10-06 LAB — MAGNESIUM: Magnesium: 2.1 mg/dL (ref 1.7–2.4)

## 2024-10-06 LAB — HEPARIN LEVEL (UNFRACTIONATED): Heparin Unfractionated: 0.3 [IU]/mL (ref 0.30–0.70)

## 2024-10-06 MED ORDER — FUROSEMIDE 10 MG/ML IJ SOLN
80.0000 mg | Freq: Two times a day (BID) | INTRAMUSCULAR | Status: DC
  Administered 2024-10-06 – 2024-10-07 (×3): 80 mg via INTRAVENOUS
  Filled 2024-10-06 (×3): qty 8

## 2024-10-06 MED ORDER — HEPARIN (PORCINE) 25000 UT/250ML-% IV SOLN
1950.0000 [IU]/h | INTRAVENOUS | Status: DC
  Administered 2024-10-06 – 2024-10-07 (×2): 1750 [IU]/h via INTRAVENOUS
  Filled 2024-10-06 (×2): qty 250

## 2024-10-06 NOTE — Plan of Care (Signed)
   Problem: Coping: Goal: Level of anxiety will decrease Outcome: Progressing

## 2024-10-06 NOTE — Progress Notes (Signed)
 PHARMACY - ANTICOAGULATION CONSULT NOTE  Pharmacy Consult for Heparin  Indication: atrial fibrillation  Allergies  Allergen Reactions   Amoxicillin Hives and Other (See Comments)    REACTION: whelps/pain in knees and ankles   Penicillin G     Other reaction(s): hives    Patient Measurements: Height: 6' 2 (188 cm) Weight: 106.6 kg (235 lb 1.6 oz) IBW/kg (Calculated) : 82.2 HEPARIN  DW (KG): 99.8  Vital Signs: Temp: 97.7 F (36.5 C) (12/12 0735) Temp Source: Oral (12/12 0735) BP: 117/84 (12/12 0735) Pulse Rate: 119 (12/12 0735)  Labs: Recent Labs    10/04/24 0527 10/05/24 0855 10/06/24 0805  HGB 9.0* 9.0* 8.5*  HCT 26.6* 27.2* 25.4*  PLT 437* 520* 410*  HEPARINUNFRC <0.10*  --   --   CREATININE 1.09 1.14 1.09    Estimated Creatinine Clearance: 68 mL/min (by C-G formula based on SCr of 1.09 mg/dL).  Assessment: 84 yoM on apixaban  PTA for afib, last dose 12/3 AM. Apixaban  held for ERCP.  Pharmacy consulted for heparin , ultimately held on 12/10 with ongoing dark stools.  Pt s/p EGD with no high risk stigmata or bleeding. Pharmacy to resume heparin , will target lower goal range and defer bolus. Pt previously therapeutic on 2150 units/h - will start lower given recent bleeding concerns.  Goal of Therapy:  Heparin  level 0.3-0.5 units/ml Monitor platelets by anticoagulation protocol: Yes   Plan:  Heparin  1750 units/h Check heparin  level in 8h   Ozell Jamaica, PharmD, [place], [facility] Clinical Pharmacist [phone] Please check AMION for all [facility] numbers 10/06/2024

## 2024-10-06 NOTE — Progress Notes (Addendum)
 Progress Note   Patient: Lance Hubbard FMW:[id number] DOB: 29-Sep-1941 DOA: 09/27/2024     8 DOS: the patient was seen and examined on 10/06/2024   Brief hospital course: Lance Hubbard was admitted to the hospital with the working diagnosis of choledocholithiasis sp cholecystectomy, complicated with bilary leak.   83 y.o. male with medical history significant for hypertension, chronic atrial fibrillation, CML and osteoarthritis, who presented to the ER with acute onset of severe upper abdominal pain with associated hypotension.  11/28 Laparoscopic cholecystectomy. At home patient having poor appetite, and persistent abdominal pain. On the day of admission he had severe pain after having breakfast, epigastric and radiated to the right upper quadrant. Because severe symptoms EMS was called, he was found in atrial fibrillation with RVR at 130 bpm, he received IV fentanyl , IV fluids 500 cc and was transported to the ED.  On his initial physical examination his blood pressure was 118/77, HR 171, RR 26.  Lungs with no wheezing or rhonchi, heart with S1 and S2 present, tachycardic, irregularly irregular with no gallops, rubs or murmurs, respiratory with no wheezing or rhonchi, abdomen with tenderness at the epigastrium and right upper quadrant, with no rebound or guarding, no lower extremity edema.   Labs revealed lipase 146 AST 69 ALT 102, total bili of 3.9 up from 1.4, WBC 16.8.  EKG showed atrial fibrillation with RVR, PVCs and low voltage QRS.    CT abdomen pelvis showed 2 stones in the distal CBD, with third gallstone within the duodenum all measuring about 6 mm, noted interval change of cholecystectomy with a 6.7 x 2.1 cm fluid collection in the gallbladder fossa, adjacent fat stranding, and fluid tracking inferiorly in the pericolic gutter. Findings may be postoperative however are concerning for developing abscess vs concern for biliary leak, HIDA scan is recommended for further evaluation. Moderate  right pleural effusion and atelectasis or pneumonia in the right lower lobe, moderate to large colonic stool burden.  Cardiology, GI and general surgery consulted.  Patient admitted for further management.   Patient was placed on antibiotic therapy.  12/05 ERCP with choledocholithiasis, underwent complete removal accomplished by biliary sphincterotomy. Bile leak was found. One temporary plastic pancreatic stent was placed into the ventral pancreatic duct. Biliary tree was swept.  One plastic biliary stent was placed into the common bile duct.   12/10 persistent anemia, underwent EGD, with no signs of acute bleeding. 12/11 changed antibiotic therapy to po.  12/12 resume heparin  IV for anticoagulation   Assessment and Plan: * Choledocholithiasis SP stone extraction by ERCP Sepsis present on admission, now resolved.  Noted bile leak and stents placed.  Possible post ERCP bleeding 12/10 EGD with no active bleeding.  Wbc 14.2  Change antibiotic therapy to ciprofloxacin  and metronidazole   Continue with pantoprazole  to po  Tolerating po well, soft diet.   Acute post operative anemia, follow up hgb at 8.5   No current indication for transfusion.   Paroxysmal atrial fibrillation (HCC) Continue telemetry monitoring.   Continue with metoprolol  and amiodarone  Plan to resume IV heparin  today for anticoagulation    Acute on chronic systolic CHF (congestive heart failure) (HCC) Limited echocardiogram with reduced LV systolic function 30 to 35%, global hypokinesis, RV systolic function preserved. LA with moderate dilation,   Urine output is 2000 ml Systolic blood pressure 102 mmHg range.   Continue diuresis with IV furosemide  80 mg IV bid. Continue with spironolactone for RAAS inhibition. Metoprolol  succinate 50 mg po daily.   Acute cardiogenic  pulmonary edema, with right pleural effusion.  Chest radiograph 12/12 with positive cardiomegaly, bilateral hilar vascular congestion and small  right pleural effusion.  Today 02 saturation is 97%  HTN (hypertension) Continue blood pressure monitoring  Continue metoprolol  50 mg po daily.   Coronary artery calcification seen on CAT scan No chest pain, no acute coronary syndrome   AKI (acute kidney injury) Hyponatremia, hypokalemia.   Today renal function with serum cr 1,0 with K at 4,0 and serum bicarbonate at 27  Na 135 and Mg 2.1   Plan to continue diuresis and follow up renal function and electrolytes in am.   Dyslipidemia Continue statin therapy  Hypothyroidism Continue levothyroxine    CML (chronic myelocytic leukemia) (HCC) Plan to hold on bosutinib in the setting of acute infection  Apparently his wide has been giving him this medication.      Subjective: Patient with dyspnea and edema, no chest pain, no nausea or vomiting, no abdominal pain, continue very weak and deconditioned   Physical Exam: Vitals:   10/05/24 2010 10/05/24 2336 10/06/24 0549 10/06/24 0735  BP: (!) 113/96 92/67 103/70 117/84  Pulse: (!) 103 92 (!) 115 (!) 119  Resp: 18 18 18 18   Temp: 98.7 F (37.1 C) 97.8 F (36.6 C) 97.8 F (36.6 C) 97.7 F (36.5 C)  TempSrc: Oral Oral Oral Oral  SpO2: 98% 98% 98% 98%  Weight:   106.6 kg   Height:       Neurology awake and alert, deconditioned  ENT with mild pallor with no icterus  Cardiovascular with S1 and S2 present, irregularly irregular with no gallops, rubs or murmurs Respiratory with rales bilaterally with no wheezing or rhonchi  Abdomen with no distention, soft and non tender Positive lower extremity edema pitting ++   Data Reviewed:    Family Communication: I spoke with patient's wife at the bedside, we talked in detail about patient's condition, plan of care and prognosis and all questions were addressed.   Disposition: Status is: Inpatient Remains inpatient appropriate because: IV diuresis and IV heparin    Planned Discharge Destination: Home     Author: Elidia Toribio Furnace, MD 10/06/2024 11:40 AM  For on call review [email or web address].

## 2024-10-06 NOTE — Progress Notes (Addendum)
 Progress Note  Patient Name: Lance Hubbard Date of Encounter: 10/06/2024 [facility] Cardiologist: Wilbert Bihari, MD   Interval Summary   Reported not feeling well today, worsening shortness of breath and bloating. SOB started yesterday afternoon and hindered him from sleeping last night. Not as hungry today. Worried about fluid recollecting in his lung.   Vital Signs Vitals:   10/05/24 2010 10/05/24 2336 10/06/24 0549 10/06/24 0735  BP: (!) 113/96 92/67 103/70 117/84  Pulse: (!) 103 92 (!) 115 (!) 119  Resp: 18 18 18 18   Temp: 98.7 F (37.1 C) 97.8 F (36.6 C) 97.8 F (36.6 C) 97.7 F (36.5 C)  TempSrc: Oral Oral Oral Oral  SpO2: 98% 98% 98% 98%  Weight:   106.6 kg   Height:        Intake/Output Summary (Last 24 hours) at 10/06/2024 0800 Last data filed at 10/06/2024 0328 Gross per 24 hour  Intake 1566.18 ml  Output 2000 ml  Net -433.82 ml      10/06/2024    5:49 AM 10/05/2024    4:40 AM 10/04/2024    5:17 AM  Last 3 Weights  Weight (lbs) 235 lb 1.6 oz 236 lb 11.2 oz 239 lb 11.2 oz  Weight (kg) 106.641 kg 107.366 kg 108.727 kg      Telemetry/ECG  AF HR ~105 - Personally Reviewed  Physical Exam  GEN: No acute distress.   Neck: NJVD Cardiac: Irregularly, irregular, no murmurs, rubs, or gallops.  Respiratory: Diminished right lower base GI: Soft, nontender, distended  MS: 1+ edema  Assessment & Plan  Patient with past medical history significant for atrial fibrillation status post PVI 05/2021, hypothyroidism, hypertension, CML, hyperlipidemia, tobacco use. Patient admitted for sepsis secondary to biliary pancreatitis status post cholecystectomy. Cardiology following for recurrence of atrial fibrillation and acute HFrEF.  Acute on chronic HFrEF  Hypoalbuminemia [2.9] Net IO Since Admission: -2,981.23 mL [10/06/24 0801] Weight is up from admission though improving with IV diuresis Per EGD report notable gut edema.   On exam, appears volume up.   Will obtain CXR   Increase IV lasix  80 mg to BID, watch renal function closely Continue Toprol  50 mg  Spironolactone 12.5 mg started yesterday, BP has remained stable Hypotension has limited GDMT, PTA entresto  on hold. Would continue to hold to allow BP room for rate control.    Anemia Hgb 8.5 today. IV heparin  stopped.  EGD which showed congestion and ulceration around sphincterotomy. Gastritis and duodenitis. Restart heparin  when okay by GI  Atrial fibrillation with episode of RVR Currently in AF Chad Vasc score 3 Keep K > 4 and Mag > 2   Plan is to pursue DCCV, most likely outpatient once patient is not requiring procedures and hgb is stable.    IV heparin  on hold 2/2 acute anemia see above. Continue Toprol  50 mg daily Continue IV amiodarone    Pleural effusion  RT thoracentesis on 12/8 with 1.4L of hazy amber fluid removed. Not sent for labs.   Hyperlipidemia Continue crestor  5 mg    Per primary Sepsis likely choledocholithiasis, biliary leak Elevated LFTs- improved Intra-abdominal fluid collection CML Hypothyroidism AKI-improved   For questions or updates, please contact [facility] Please consult [email or web address] for contact info under       Signed, [name], PA-C   Patient seen and examined.  Agree with above documentation.  On exam, patient is alert and oriented, tachycardic, irregular, no murmurs, diminished breath sounds, trace lower extremity edema.  Remains volume overloaded, will continue IV Lasix .  Continue amiodarone  and metoprolol  for A-fib.  Restart IV heparin  once okay per GI.  [name], MD

## 2024-10-06 NOTE — Progress Notes (Signed)
 PHARMACY - ANTICOAGULATION CONSULT NOTE  Pharmacy Consult for Heparin  Indication: atrial fibrillation  Allergies  Allergen Reactions   Amoxicillin Hives and Other (See Comments)    REACTION: whelps/pain in knees and ankles   Penicillin G     Other reaction(s): hives    Patient Measurements: Height: 6' 2 (188 cm) Weight: 106.6 kg (235 lb 1.6 oz) IBW/kg (Calculated) : 82.2 HEPARIN  DW (KG): 99.8  Vital Signs: Temp: 97.8 F (36.6 C) (12/12 1700) Temp Source: Oral (12/12 1700) BP: 128/87 (12/12 1819) Pulse Rate: 92 (12/12 1700)  Labs: Recent Labs    10/04/24 0527 10/05/24 0855 10/06/24 0805 10/06/24 2021  HGB 9.0* 9.0* 8.5*  --   HCT 26.6* 27.2* 25.4*  --   PLT 437* 520* 410*  --   HEPARINUNFRC <0.10*  --   --  0.30  CREATININE 1.09 1.14 1.09  --     Estimated Creatinine Clearance: 68 mL/min (by C-G formula based on SCr of 1.09 mg/dL).  Assessment: 73 yoM on apixaban  PTA for afib, last dose 12/3 AM. Apixaban  held for ERCP.  Pharmacy consulted for heparin , ultimately held on 12/10 with ongoing dark stools.  Pt s/p EGD with no high risk stigmata or bleeding. Pharmacy to resume heparin , will target lower goal range and defer bolus. Pt previously therapeutic on 2150 units/h - will start lower given recent bleeding concerns.  12/12 PM: Heparin  level low end of therapeutic at 0.30. No infusion issues or bleeding per RN. Will continue at current rate.  Goal of Therapy:  Heparin  level 0.3-0.5 units/ml Monitor platelets by anticoagulation protocol: Yes   Plan:  Continue Heparin  1750 units/h Check heparin  level and CBC daily   Larraine Brazier, PharmD Clinical Pharmacist 10/06/2024  9:06 PM **Pharmacist phone directory can now be found on [email or web address] ([email or web address]).  Listed under [facility].

## 2024-10-06 NOTE — Progress Notes (Signed)
 Mobility Specialist Progress Note:   10/06/24 1250  Mobility  Activity Ambulated with assistance  Level of Assistance Contact guard assist, steadying assist  Assistive Device Front wheel walker  Distance Ambulated (ft) 200 ft  Activity Response Tolerated well  Mobility Referral Yes  Mobility visit 1 Mobility  Mobility Specialist Start Time (ACUTE ONLY) 0940  Mobility Specialist Stop Time (ACUTE ONLY) 0955  Mobility Specialist Time Calculation (min) (ACUTE ONLY) 15 min   Received pt in bed having no complaints and agreeable to mobility. Pt was asymptomatic throughout ambulation and returned to room w/o fault. Left in bed w/ call bell in reach and all needs met.  Pre Mobility HR 101 During Mobility HR 110-122 Post Mobility HR 103  [name] Mobility Specialist  Please contact vis Secure Chat or  Rehab Office [phone]

## 2024-10-07 LAB — CBC
HCT: 26 % — ABNORMAL LOW (ref 39.0–52.0)
Hemoglobin: 8.7 g/dL — ABNORMAL LOW (ref 13.0–17.0)
MCH: 29.2 pg (ref 26.0–34.0)
MCHC: 33.5 g/dL (ref 30.0–36.0)
MCV: 87.2 fL (ref 80.0–100.0)
Platelets: 407 K/uL — ABNORMAL HIGH (ref 150–400)
RBC: 2.98 MIL/uL — ABNORMAL LOW (ref 4.22–5.81)
RDW: 15.2 % (ref 11.5–15.5)
WBC: 12.4 K/uL — ABNORMAL HIGH (ref 4.0–10.5)
nRBC: 0 % (ref 0.0–0.2)

## 2024-10-07 LAB — COMPREHENSIVE METABOLIC PANEL WITH GFR
ALT: 37 U/L (ref 0–44)
AST: 20 U/L (ref 15–41)
Albumin: 2.8 g/dL — ABNORMAL LOW (ref 3.5–5.0)
Alkaline Phosphatase: 78 U/L (ref 38–126)
Anion gap: 7 (ref 5–15)
BUN: 24 mg/dL — ABNORMAL HIGH (ref 8–23)
CO2: 29 mmol/L (ref 22–32)
Calcium: 8.3 mg/dL — ABNORMAL LOW (ref 8.9–10.3)
Chloride: 99 mmol/L (ref 98–111)
Creatinine, Ser: 1.28 mg/dL — ABNORMAL HIGH (ref 0.61–1.24)
GFR, Estimated: 56 mL/min — ABNORMAL LOW (ref >60)
Glucose, Bld: 97 mg/dL (ref 70–99)
Potassium: 3.8 mmol/L (ref 3.5–5.1)
Sodium: 135 mmol/L (ref 135–145)
Total Bilirubin: 0.9 mg/dL (ref 0.0–1.2)
Total Protein: 5.5 g/dL — ABNORMAL LOW (ref 6.5–8.1)

## 2024-10-07 LAB — HEPARIN LEVEL (UNFRACTIONATED): Heparin Unfractionated: 0.2 [IU]/mL — ABNORMAL LOW (ref 0.30–0.70)

## 2024-10-07 LAB — MAGNESIUM: Magnesium: 2 mg/dL (ref 1.7–2.4)

## 2024-10-07 MED ORDER — APIXABAN 5 MG PO TABS
5.0000 mg | ORAL_TABLET | Freq: Two times a day (BID) | ORAL | Status: DC
  Administered 2024-10-07 – 2024-10-11 (×9): 5 mg via ORAL
  Filled 2024-10-07 (×9): qty 1

## 2024-10-07 MED ORDER — POTASSIUM CHLORIDE CRYS ER 20 MEQ PO TBCR
20.0000 meq | EXTENDED_RELEASE_TABLET | Freq: Once | ORAL | Status: AC
  Administered 2024-10-07: 20 meq via ORAL
  Filled 2024-10-07: qty 1

## 2024-10-07 MED ORDER — PANTOPRAZOLE SODIUM 40 MG PO TBEC
40.0000 mg | DELAYED_RELEASE_TABLET | Freq: Two times a day (BID) | ORAL | Status: DC
  Administered 2024-10-07 – 2024-10-09 (×4): 40 mg via ORAL
  Filled 2024-10-07 (×4): qty 1

## 2024-10-07 MED ORDER — AMIODARONE HCL 200 MG PO TABS
200.0000 mg | ORAL_TABLET | Freq: Two times a day (BID) | ORAL | Status: DC
  Administered 2024-10-07 – 2024-10-11 (×9): 200 mg via ORAL
  Filled 2024-10-07 (×9): qty 1

## 2024-10-07 NOTE — Progress Notes (Signed)
 Progress Note   Patient: Lance Hubbard FMW:[id number] DOB: 1941/06/11 DOA: 09/27/2024     9 DOS: the patient was seen and examined on 10/07/2024   Brief hospital course: Mr. Graca was admitted to the hospital with the working diagnosis of choledocholithiasis sp cholecystectomy, complicated with bilary leak.   83 y.o. male with medical history significant for hypertension, chronic atrial fibrillation, CML and osteoarthritis, who presented to the ER with acute onset of severe upper abdominal pain with associated hypotension.  11/28 Laparoscopic cholecystectomy. At home patient having poor appetite, and persistent abdominal pain. On the day of admission he had severe pain after having breakfast, epigastric and radiated to the right upper quadrant. Because severe symptoms EMS was called, he was found in atrial fibrillation with RVR at 130 bpm, he received IV fentanyl , IV fluids 500 cc and was transported to the ED.  On his initial physical examination his blood pressure was 118/77, HR 171, RR 26.  Lungs with no wheezing or rhonchi, heart with S1 and S2 present, tachycardic, irregularly irregular with no gallops, rubs or murmurs, respiratory with no wheezing or rhonchi, abdomen with tenderness at the epigastrium and right upper quadrant, with no rebound or guarding, no lower extremity edema.   Labs revealed lipase 146 AST 69 ALT 102, total bili of 3.9 up from 1.4, WBC 16.8.  EKG showed atrial fibrillation with RVR, PVCs and low voltage QRS.    CT abdomen pelvis showed 2 stones in the distal CBD, with third gallstone within the duodenum all measuring about 6 mm, noted interval change of cholecystectomy with a 6.7 x 2.1 cm fluid collection in the gallbladder fossa, adjacent fat stranding, and fluid tracking inferiorly in the pericolic gutter. Findings may be postoperative however are concerning for developing abscess vs concern for biliary leak, HIDA scan is recommended for further evaluation. Moderate  right pleural effusion and atelectasis or pneumonia in the right lower lobe, moderate to large colonic stool burden.  Cardiology, GI and general surgery consulted.  Patient admitted for further management.   Patient was placed on antibiotic therapy.  12/05 ERCP with choledocholithiasis, underwent complete removal accomplished by biliary sphincterotomy. Bile leak was found. One temporary plastic pancreatic stent was placed into the ventral pancreatic duct. Biliary tree was swept.  One plastic biliary stent was placed into the common bile duct.   12/10 persistent anemia, underwent EGD, with no signs of acute bleeding. 12/11 changed antibiotic therapy to po.  12/12 resume heparin  IV for anticoagulation  12/13 transition to po apixaban , and po pantoprazole .   Assessment and Plan: * Choledocholithiasis SP stone extraction by ERCP Sepsis present on admission, now resolved.  Noted bile leak and stents placed.  Possible post ERCP bleeding 12/10 EGD with no active bleeding.  Wbc 12.4 (trending down)   Continue with oral antibiotic  ciprofloxacin  and metronidazole   Continue with pantoprazole  to po  Diet advanced to regular heart healthy with good toleration.   Acute post operative anemia, follow up hgb at 8.7   No current indication for transfusion.   Paroxysmal atrial fibrillation (HCC) Continue atrial fibrillation rhythm.  Continue with metoprolol  and amiodarone  (transitioned from IV to po, 200 mg bid)   Today transition from IV heparin  to apixaban .   Acute on chronic systolic CHF (congestive heart failure) (HCC) Limited echocardiogram with reduced LV systolic function 30 to 35%, global hypokinesis, RV systolic function preserved. LA with moderate dilation,   Urine output is 2,050 ml Systolic blood pressure 102 mmHg range.  Continue diuresis with IV furosemide  80 mg IV bid. Continue with spironolactone  for RAAS inhibition. Metoprolol  succinate 50 mg po daily.   Acute cardiogenic  pulmonary edema, with right pleural effusion.  Chest radiograph 12/12 with positive cardiomegaly, bilateral hilar vascular congestion and small right pleural effusion.  Today 02 saturation is 98% on room air.   HTN (hypertension) Continue blood pressure monitoring  Continue metoprolol  50 mg po daily.   Coronary artery calcification seen on CAT scan No chest pain, no acute coronary syndrome   AKI (acute kidney injury) Hyponatremia, hypokalemia.   Renal function with serum cr at 1,28 with K at 3,8 and serum bicarbonate at 29  Na 135 and Mg 2.0   Plan to continue diuresis and follow up renal function and electrolytes in am.  Add 40 meq Kcl to prevent hypokalemia   Dyslipidemia Continue statin therapy  Hypothyroidism Continue levothyroxine    CML (chronic myelocytic leukemia) (HCC) Plan to hold on bosutinib in the setting of acute infection  Apparently his wide has been giving him this medication.       Subjective: Patient is feeling better, dyspnea and edema have improved, no chest pain, abdominal pain has improved and he is able to tolerate regular diet.   Physical Exam: Vitals:   10/06/24 2218 10/07/24 0013 10/07/24 0438 10/07/24 0842  BP: 117/85 108/73 108/78 115/73  Pulse: 96 96 95 100  Resp: 18 18 18 16   Temp: 98.8 F (37.1 C) 98.5 F (36.9 C) 97.6 F (36.4 C) (!) 97.4 F (36.3 C)  TempSrc: Oral Oral Oral Oral  SpO2: 98% 98% 96% 97%  Weight:   105.8 kg   Height:       Neurology awake and alert, deconditioned ENT with mild pallor with no icterus Cardiovascular with S1 and S2 present, irregularly irregular with no gallops, rubs or murmurs Respiratory with mild rales at bases with no wheezing or rhonchi Abdomen soft and non tender Positive lower extremity edema ++  Data Reviewed:    Family Communication: no family at the beside   Disposition: Status is: Inpatient Remains inpatient appropriate because: IV diuresis   Planned Discharge Destination:  Home    Author: Elidia Toribio Furnace, MD 10/07/2024 10:41 AM  For on call review [email or web address].

## 2024-10-07 NOTE — Progress Notes (Signed)
°  Progress Note  Patient Name: Lance Hubbard Date of Encounter: 10/07/2024 [facility] Cardiologist: Wilbert Bihari, MD   Interval Summary   BP 115/73. Net negative 1.7L.  Mild bump in Cr (1.09>1.28).  Hgb stable at 8.7.  Vital Signs Vitals:   10/06/24 2218 10/07/24 0013 10/07/24 0438 10/07/24 0842  BP: 117/85 108/73 108/78 115/73  Pulse: 96 96 95 100  Resp: 18 18 18 16   Temp: 98.8 F (37.1 C) 98.5 F (36.9 C) 97.6 F (36.4 C) (!) 97.4 F (36.3 C)  TempSrc: Oral Oral Oral Oral  SpO2: 98% 98% 96% 97%  Weight:   105.8 kg   Height:        Intake/Output Summary (Last 24 hours) at 10/07/2024 1017 Last data filed at 10/07/2024 0444 Gross per 24 hour  Intake 391.37 ml  Output 2050 ml  Net -1658.63 ml      10/07/2024    4:38 AM 10/06/2024    5:49 AM 10/05/2024    4:40 AM  Last 3 Weights  Weight (lbs) 233 lb 3.2 oz 235 lb 1.6 oz 236 lb 11.2 oz  Weight (kg) 105.779 kg 106.641 kg 107.366 kg      Telemetry/ECG  AF HR ~105 - Personally Reviewed  Physical Exam  GEN: No acute distress.   Neck: + JVD Cardiac: Irregularly, irregular, no murmurs, rubs, or gallops.  Respiratory: Diminished right lower base GI: Soft, nontender, distended  MS: 1+ edema  Assessment & Plan  Patient with past medical history significant for atrial fibrillation status post PVI 05/2021, hypothyroidism, hypertension, CML, hyperlipidemia, tobacco use. Patient admitted for sepsis secondary to biliary pancreatitis status post cholecystectomy. Cardiology following for recurrence of atrial fibrillation and acute HFrEF.  Acute on chronic HFrEF  Hypoalbuminemia [2.9] Net IO Since Admission: -4,639.86 mL [10/07/24 1017] Weight is up from admission though improving with IV diuresis On exam, appears volume up.  CXR yesterday with small bilateral pleural effusions   Continue IV lasix  80 mg  BID Continue Toprol  50 mg  Continue Spironolactone  12.5 mg  Hypotension has limited GDMT, PTA entresto  on  hold. Would continue to hold to allow BP room for rate control.    Anemia Hgb 8.7 today. Restarting eliquis  today EGD which showed congestion and ulceration around sphincterotomy. Gastritis and duodenitis. Restarted eliquis  today  Atrial fibrillation with episode of RVR Currently in AF Chad Vasc score 3 Keep K > 4 and Mag > 2   Plan is to pursue DCCV, most likely outpatient once patient is not requiring procedures and hgb is stable.    Restarted IV heparin  and tolerated, now transitioned to eliquis  Continue Toprol  50 mg daily Will transition from IV amiodarone  drip, start p.o. amiodarone  200 mg twice daily   Pleural effusion  RT thoracentesis on 12/8 with 1.4L of hazy amber fluid removed. Not sent for labs.   Hyperlipidemia Continue crestor  5 mg    Per primary Sepsis likely choledocholithiasis, biliary leak Elevated LFTs- improved Intra-abdominal fluid collection CML Hypothyroidism AKI-improved   For questions or updates, please contact [facility] Please consult [email or web address] for contact info under       Signed, [name], MD

## 2024-10-07 NOTE — Progress Notes (Signed)
 PHARMACY - ANTICOAGULATION CONSULT NOTE  Pharmacy Consult for apixaban  Indication: atrial fibrillation  Allergies[1]  Patient Measurements: Height: 6' 2 (188 cm) Weight: 105.8 kg (233 lb 3.2 oz) IBW/kg (Calculated) : 82.2 HEPARIN  DW (KG): 99.8  Vital Signs: Temp: 97.4 F (36.3 C) (12/13 0842) Temp Source: Oral (12/13 0842) BP: 115/73 (12/13 0842) Pulse Rate: 100 (12/13 0842)  Labs: Recent Labs    10/05/24 0855 10/06/24 0805 10/06/24 2021 10/07/24 0336  HGB 9.0* 8.5*  --  8.7*  HCT 27.2* 25.4*  --  26.0*  PLT 520* 410*  --  407*  HEPARINUNFRC  --   --  0.30 0.20*  CREATININE 1.14 1.09  --  1.28*    Estimated Creatinine Clearance: 57.6 mL/min (A) (by C-G formula based on SCr of 1.28 mg/dL (H)).   Medical History: Past Medical History:  Diagnosis Date   Arthritis    CML (chronic myelocytic leukemia) (HCC)    in remission   History of kidney stones    Hypertension    Persistent atrial fibrillation (HCC)    Atrial Fibrillation    Medications:  Scheduled:   apixaban   5 mg Oral BID   Chlorhexidine  Gluconate Cloth  6 each Topical Daily   ciprofloxacin   500 mg Oral BID   furosemide   80 mg Intravenous BID   levothyroxine   25 mcg Oral QAC breakfast   metoprolol  succinate  50 mg Oral Daily   metroNIDAZOLE   500 mg Oral Q12H   pantoprazole  (PROTONIX ) IV  40 mg Intravenous Q12H   polyethylene glycol  17 g Oral Daily   rosuvastatin   5 mg Oral QODAY   senna-docusate  1 tablet Oral BID   sodium chloride  flush  10-40 mL Intracatheter Q12H   sodium chloride  flush  10-40 mL Intracatheter Q12H   spironolactone   12.5 mg Oral Daily   Infusions:   amiodarone  30 mg/hr (10/06/24 2156)    Assessment: Patient was on apixaban  5mg  bid PTA for afib; was held and was on heparin  for last day. Heparin  discontinued and pharmacy consulted to restart apixaban . Hgb stable and plt WNL.   Goal of Therapy:  Monitor platelets by anticoagulation protocol: Yes   Plan:  -Stop heparin   infusion and start apixaban  5mg  bid.  -Monitor CBC while inpatient  Maurilio Patten, PharmD PGY1 Pharmacy Resident [facility] 10/07/2024 10:02 AM    [1]  Allergies Allergen Reactions   Amoxicillin Hives and Other (See Comments)    REACTION: whelps/pain in knees and ankles   Penicillin G     Other reaction(s): hives

## 2024-10-07 NOTE — Progress Notes (Signed)
 PHARMACY - ANTICOAGULATION  Pharmacy Consult for Heparin  Indication: atrial fibrillation Brief A/P: Heparin  level subtherapeutic Increase Heparin  rate  Allergies  Allergen Reactions   Amoxicillin Hives and Other (See Comments)    REACTION: whelps/pain in knees and ankles   Penicillin G     Other reaction(s): hives    Patient Measurements: Height: 6' 2 (188 cm) Weight: 105.8 kg (233 lb 3.2 oz) IBW/kg (Calculated) : 82.2 HEPARIN  DW (KG): 99.8  Vital Signs: Temp: 97.6 F (36.4 C) (12/13 0438) Temp Source: Oral (12/13 0438) BP: 108/78 (12/13 0438) Pulse Rate: 95 (12/13 0438)  Labs: Recent Labs    10/05/24 0855 10/06/24 0805 10/06/24 2021 10/07/24 0336  HGB 9.0* 8.5*  --  8.7*  HCT 27.2* 25.4*  --  26.0*  PLT 520* 410*  --  407*  HEPARINUNFRC  --   --  0.30 0.20*  CREATININE 1.14 1.09  --  1.28*    Estimated Creatinine Clearance: 57.6 mL/min (A) (by C-G formula based on SCr of 1.28 mg/dL (H)).  Assessment: 83 y.o. male with h/o Afib, Eliquis  on hold, for heparin   Goal of Therapy:  Heparin  level 0.3-0.5 units/ml Monitor platelets by anticoagulation protocol: Yes   Plan:  Increase Heparin  1950 units/hr Check heparin  level in 8 hours.   Cathlyn Arrant, PharmD, BCPS

## 2024-10-08 ENCOUNTER — Other Ambulatory Visit (HOSPITAL_COMMUNITY): Payer: Self-pay

## 2024-10-08 DIAGNOSIS — I5023 Acute on chronic systolic (congestive) heart failure: Secondary | ICD-10-CM | POA: Diagnosis not present

## 2024-10-08 DIAGNOSIS — K8050 Calculus of bile duct without cholangitis or cholecystitis without obstruction: Secondary | ICD-10-CM | POA: Diagnosis not present

## 2024-10-08 DIAGNOSIS — I159 Secondary hypertension, unspecified: Secondary | ICD-10-CM | POA: Diagnosis not present

## 2024-10-08 DIAGNOSIS — I480 Paroxysmal atrial fibrillation: Secondary | ICD-10-CM | POA: Diagnosis not present

## 2024-10-08 LAB — CBC
HCT: 24.4 % — ABNORMAL LOW (ref 39.0–52.0)
Hemoglobin: 8.1 g/dL — ABNORMAL LOW (ref 13.0–17.0)
MCH: 29.6 pg (ref 26.0–34.0)
MCHC: 33.2 g/dL (ref 30.0–36.0)
MCV: 89.1 fL (ref 80.0–100.0)
Platelets: 359 K/uL (ref 150–400)
RBC: 2.74 MIL/uL — ABNORMAL LOW (ref 4.22–5.81)
RDW: 15.4 % (ref 11.5–15.5)
WBC: 10.8 K/uL — ABNORMAL HIGH (ref 4.0–10.5)
nRBC: 0 % (ref 0.0–0.2)

## 2024-10-08 LAB — BASIC METABOLIC PANEL WITH GFR
Anion gap: 8 (ref 5–15)
BUN: 24 mg/dL — ABNORMAL HIGH (ref 8–23)
CO2: 32 mmol/L (ref 22–32)
Calcium: 8.3 mg/dL — ABNORMAL LOW (ref 8.9–10.3)
Chloride: 95 mmol/L — ABNORMAL LOW (ref 98–111)
Creatinine, Ser: 1.32 mg/dL — ABNORMAL HIGH (ref 0.61–1.24)
GFR, Estimated: 54 mL/min — ABNORMAL LOW (ref >60)
Glucose, Bld: 90 mg/dL (ref 70–99)
Potassium: 3.9 mmol/L (ref 3.5–5.1)
Sodium: 135 mmol/L (ref 135–145)

## 2024-10-08 LAB — BRAIN NATRIURETIC PEPTIDE: B Natriuretic Peptide: 694.6 pg/mL — ABNORMAL HIGH (ref 0.0–100.0)

## 2024-10-08 LAB — MAGNESIUM: Magnesium: 2 mg/dL (ref 1.7–2.4)

## 2024-10-08 MED ORDER — GERHARDT'S BUTT CREAM
1.0000 | TOPICAL_CREAM | Freq: Every day | CUTANEOUS | Status: DC | PRN
  Administered 2024-10-08: 1 via TOPICAL
  Filled 2024-10-08: qty 60

## 2024-10-08 MED ORDER — OFF THE BEAT BOOK
Freq: Once | Status: AC
  Filled 2024-10-08: qty 1

## 2024-10-08 MED ORDER — METOPROLOL SUCCINATE ER 50 MG PO TB24
50.0000 mg | ORAL_TABLET | Freq: Two times a day (BID) | ORAL | Status: DC
  Administered 2024-10-08 – 2024-10-10 (×4): 50 mg via ORAL
  Filled 2024-10-08 (×4): qty 1

## 2024-10-08 MED ORDER — FUROSEMIDE 10 MG/ML IJ SOLN
80.0000 mg | Freq: Every day | INTRAMUSCULAR | Status: DC
  Administered 2024-10-08 – 2024-10-09 (×2): 80 mg via INTRAVENOUS
  Filled 2024-10-08 (×2): qty 8

## 2024-10-08 NOTE — Progress Notes (Addendum)
°  Progress Note  Patient Name: Lance Hubbard Date of Encounter: 10/08/2024 [facility] Cardiologist: Wilbert Bihari, MD   Interval Summary   BP 118/81. Net negative 2.1L.  Cr stable (1.28>1.32).  Hgb drop 8.7>8.1  Vital Signs Vitals:   10/07/24 2045 10/08/24 0046 10/08/24 0519 10/08/24 0840  BP: 109/83 107/73 106/75 118/81  Pulse: (!) 115 (!) 114 (!) 110 93  Resp: 18 18 18 20   Temp: 98.3 F (36.8 C) 98.6 F (37 C) 98 F (36.7 C) (!) 97.5 F (36.4 C)  TempSrc: Oral Axillary Oral Oral  SpO2: 98% 94% 98% 100%  Weight:   104.2 kg   Height:        Intake/Output Summary (Last 24 hours) at 10/08/2024 1042 Last data filed at 10/08/2024 0840 Gross per 24 hour  Intake 1105.1 ml  Output 2925 ml  Net -1819.9 ml      10/08/2024    5:19 AM 10/07/2024    4:38 AM 10/06/2024    5:49 AM  Last 3 Weights  Weight (lbs) 229 lb 12.8 oz 233 lb 3.2 oz 235 lb 1.6 oz  Weight (kg) 104.237 kg 105.779 kg 106.641 kg      Telemetry/ECG  AF HR ~100s to 120s- Personally Reviewed  Physical Exam  GEN: No acute distress.   Neck: + JVD Cardiac: Irregularly, irregular, no murmurs, rubs, or gallops.  Respiratory: Diminished right lower base GI: Soft, nontender, distended  MS: 1+ edema  Assessment & Plan  Patient with past medical history significant for atrial fibrillation status post PVI 05/2021, hypothyroidism, hypertension, CML, hyperlipidemia, tobacco use. Patient admitted for sepsis secondary to biliary pancreatitis status post cholecystectomy. Cardiology following for recurrence of atrial fibrillation and acute HFrEF.  Acute on chronic HFrEF  Hypoalbuminemia [2.9] Net IO Since Admission: -6,459.76 mL [10/08/24 1042]  Remains volume overloaded but improving, continue IV Lasix  Continue Toprol  50 mg  Continue Spironolactone  12.5 mg  Hypotension has limited GDMT, PTA entresto  on hold. Would continue to hold to allow BP room for rate control.    Anemia Hgb 8.1 today.  Restarted  Eliquis  12/13, hemoglobin trending down today.  Will need to closely monitor, if continuing to trend down will likely need to hold. EGD showed congestion and ulceration around sphincterotomy. Gastritis and duodenitis.  Closely monitor for further bleeding  Atrial fibrillation with episode of RVR Currently in AF Chad Vasc score 3 Keep K > 4 and Mag > 2   Plan is to pursue DCCV, most likely outpatient once patient hgb is stable.    Restarted IV heparin  and tolerated, now transitioned to eliquis  on 12/13 but hemoglobin trending down this morning, will need to closely monitor Stopped IV amiodarone  12/13, started p.o. amiodarone  200 mg twice daily, would continue for one week and decrease to 200 mg daily.  Rates mildly elevated, will increase Toprol -XL dose to 50 mg twice daily   Pleural effusion  RT thoracentesis on 12/8 with 1.4L of hazy amber fluid removed. Not sent for labs.   Hyperlipidemia Continue crestor  5 mg    Per primary Sepsis likely choledocholithiasis, biliary leak Elevated LFTs- improved Intra-abdominal fluid collection CML Hypothyroidism AKI-improved   For questions or updates, please contact [facility] Please consult [email or web address] for contact info under       Signed, [name], MD

## 2024-10-08 NOTE — Plan of Care (Signed)

## 2024-10-08 NOTE — Progress Notes (Signed)
 Progress Note   Patient: Lance Hubbard FMW:[id number] DOB: 1941/06/09 DOA: 09/27/2024     10 DOS: the patient was seen and examined on 10/08/2024   Brief hospital course: Lance Hubbard was admitted to the hospital with the working diagnosis of choledocholithiasis sp cholecystectomy, complicated with bilary leak.   83 y.o. male with medical history significant for hypertension, chronic atrial fibrillation, CML and osteoarthritis, who presented to the ER with acute onset of severe upper abdominal pain with associated hypotension.  11/28 Laparoscopic cholecystectomy. At home patient having poor appetite, and persistent abdominal pain. On the day of admission he had severe pain after having breakfast, epigastric and radiated to the right upper quadrant. Because severe symptoms EMS was called, he was found in atrial fibrillation with RVR at 130 bpm, he received IV fentanyl , IV fluids 500 cc and was transported to the ED.  On his initial physical examination his blood pressure was 118/77, HR 171, RR 26.  Lungs with no wheezing or rhonchi, heart with S1 and S2 present, tachycardic, irregularly irregular with no gallops, rubs or murmurs, respiratory with no wheezing or rhonchi, abdomen with tenderness at the epigastrium and right upper quadrant, with no rebound or guarding, no lower extremity edema.   Labs revealed lipase 146 AST 69 ALT 102, total bili of 3.9 up from 1.4, WBC 16.8.  EKG showed atrial fibrillation with RVR, PVCs and low voltage QRS.    CT abdomen pelvis showed 2 stones in the distal CBD, with third gallstone within the duodenum all measuring about 6 mm, noted interval change of cholecystectomy with a 6.7 x 2.1 cm fluid collection in the gallbladder fossa, adjacent fat stranding, and fluid tracking inferiorly in the pericolic gutter. Findings may be postoperative however are concerning for developing abscess vs concern for biliary leak, HIDA scan is recommended for further evaluation. Moderate  right pleural effusion and atelectasis or pneumonia in the right lower lobe, moderate to large colonic stool burden.  Cardiology, GI and general surgery consulted.  Patient admitted for further management.   Patient was placed on antibiotic therapy.  12/05 ERCP with choledocholithiasis, underwent complete removal accomplished by biliary sphincterotomy. Bile leak was found. One temporary plastic pancreatic stent was placed into the ventral pancreatic duct. Biliary tree was swept.  One plastic biliary stent was placed into the common bile duct.   12/08 thoracentesis 1.4 L amber fluid removed.  12/10 persistent anemia, underwent EGD, with no signs of acute bleeding. 12/11 changed antibiotic therapy to po.  12/12 resume heparin  IV for anticoagulation  12/13 transition to po apixaban , and po pantoprazole .  12/14 completed antibiotic therapy for 12 days.   Assessment and Plan: * Choledocholithiasis SP stone extraction by ERCP, noted bile leak and stents placed Sepsis present on admission, now resolved.   12/10 EGD with no active bleeding, pancreatic stent was pulled.  Patient will need repeat ERCP in 2 months for assessment of the bile leak and pulling common bile duct stent if it has healed.   Patient is tolerating po well, has no fever and wbc is 10.8 Will discontinue antibiotic therapy, he had 12 days of effective antibiotic therapy.   Continue with pantoprazole  to po   Acute post operative anemia, follow up hgb at 8.1  No current indication for transfusion.   Paroxysmal atrial fibrillation (HCC) Atrial fibrillation rhythm.  Increase dose of metoprolol  succinate to 50 mg bid Continue with amiodarone    Anticoagulation with apixaban .  Continue telemetry monitoring   Acute on chronic systolic  CHF (congestive heart failure) (HCC) Limited echocardiogram with reduced LV systolic function 30 to 35%, global hypokinesis, RV systolic function preserved. LA with moderate dilation,   Urine  output is 2,925 ml Systolic blood pressure 102 mmHg range.   Continue with IV furosemide  80 mg IV daily. Continue with spironolactone  for RAAS inhibition. Metoprolol  succinate  Acute cardiogenic pulmonary edema, with right pleural effusion.  Chest radiograph 12/12 with positive cardiomegaly, bilateral hilar vascular congestion and small right pleural effusion.  Today 02 saturation is 99% on room air.   HTN (hypertension) Continue blood pressure monitoring  Continue metoprolol  50 mg succinate bid .   Coronary artery calcification seen on CAT scan No chest pain, no acute coronary syndrome   AKI (acute kidney injury) Hyponatremia, hypokalemia.   Renal function with serum cr at 1,32 with K at 3,9 and serum bicarbonate at 32 Na 135 and Mg 2,0   Plan to continue diuresis and follow up renal function and electrolytes in am.   Dyslipidemia Continue statin therapy  Hypothyroidism Continue levothyroxine    CML (chronic myelocytic leukemia) (HCC) Plan to hold on bosutinib in the setting of acute infection  Apparently his wide has been giving him this medication.         Subjective: Patient is feeling better, no chest pain and no dyspnea, he is out of bed and tolerating po well, no nausea or vomiting   Physical Exam: Vitals:   10/08/24 0046 10/08/24 0519 10/08/24 0840 10/08/24 1211  BP: 107/73 106/75 118/81 107/70  Pulse: (!) 114 (!) 110 93 (!) 135  Resp: 18 18 20 20   Temp: 98.6 F (37 C) 98 F (36.7 C) (!) 97.5 F (36.4 C) 98 F (36.7 C)  TempSrc: Axillary Oral Oral Oral  SpO2: 94% 98% 100% 99%  Weight:  104.2 kg    Height:       Neurology awake and alert ENT with mild pallor with no icterus Cardiovascular with S1 and S2 present, irregularly irregular with no gallops or rubs, no murmurs No JVD Respiratory with no rales or wheezing, no rhonchi, decreased breath sounds at the right base Abdomen soft and not distended, not tender Lower extremity edema +    Data  Reviewed:    Family Communication: no family at the bedside   Disposition: Status is: Inpatient Remains inpatient appropriate because: recovering heart failure   Planned Discharge Destination: Home     Author: Elidia Toribio Furnace, MD 10/08/2024 1:32 PM  For on call review [email or web address].

## 2024-10-09 DIAGNOSIS — Z9889 Other specified postprocedural states: Secondary | ICD-10-CM | POA: Diagnosis not present

## 2024-10-09 DIAGNOSIS — N179 Acute kidney failure, unspecified: Secondary | ICD-10-CM | POA: Diagnosis not present

## 2024-10-09 DIAGNOSIS — E785 Hyperlipidemia, unspecified: Secondary | ICD-10-CM | POA: Diagnosis not present

## 2024-10-09 DIAGNOSIS — I2510 Atherosclerotic heart disease of native coronary artery without angina pectoris: Secondary | ICD-10-CM | POA: Diagnosis not present

## 2024-10-09 DIAGNOSIS — I159 Secondary hypertension, unspecified: Secondary | ICD-10-CM | POA: Diagnosis not present

## 2024-10-09 DIAGNOSIS — I480 Paroxysmal atrial fibrillation: Secondary | ICD-10-CM | POA: Diagnosis not present

## 2024-10-09 DIAGNOSIS — I4819 Other persistent atrial fibrillation: Secondary | ICD-10-CM | POA: Diagnosis not present

## 2024-10-09 DIAGNOSIS — E039 Hypothyroidism, unspecified: Secondary | ICD-10-CM | POA: Diagnosis not present

## 2024-10-09 DIAGNOSIS — K8050 Calculus of bile duct without cholangitis or cholecystitis without obstruction: Secondary | ICD-10-CM | POA: Diagnosis not present

## 2024-10-09 DIAGNOSIS — I5023 Acute on chronic systolic (congestive) heart failure: Secondary | ICD-10-CM | POA: Diagnosis not present

## 2024-10-09 DIAGNOSIS — C9210 Chronic myeloid leukemia, BCR/ABL-positive, not having achieved remission: Secondary | ICD-10-CM | POA: Diagnosis not present

## 2024-10-09 LAB — HEMOGLOBIN AND HEMATOCRIT, BLOOD
HCT: 25.8 % — ABNORMAL LOW (ref 39.0–52.0)
Hemoglobin: 8.5 g/dL — ABNORMAL LOW (ref 13.0–17.0)

## 2024-10-09 LAB — BASIC METABOLIC PANEL WITH GFR
Anion gap: 11 (ref 5–15)
BUN: 26 mg/dL — ABNORMAL HIGH (ref 8–23)
CO2: 29 mmol/L (ref 22–32)
Calcium: 8.6 mg/dL — ABNORMAL LOW (ref 8.9–10.3)
Chloride: 98 mmol/L (ref 98–111)
Creatinine, Ser: 1.33 mg/dL — ABNORMAL HIGH (ref 0.61–1.24)
GFR, Estimated: 53 mL/min — ABNORMAL LOW (ref >60)
Glucose, Bld: 98 mg/dL (ref 70–99)
Potassium: 3.9 mmol/L (ref 3.5–5.1)
Sodium: 138 mmol/L (ref 135–145)

## 2024-10-09 LAB — MAGNESIUM: Magnesium: 2.1 mg/dL (ref 1.7–2.4)

## 2024-10-09 MED ORDER — BOSUTINIB 400 MG PO TABS: 400.0000 mg | ORAL_TABLET | Freq: Every evening | ORAL | Status: DC

## 2024-10-09 MED ORDER — PANTOPRAZOLE SODIUM 40 MG PO TBEC
40.0000 mg | DELAYED_RELEASE_TABLET | Freq: Every day | ORAL | Status: DC
  Administered 2024-10-10 – 2024-10-11 (×2): 40 mg via ORAL
  Filled 2024-10-09 (×2): qty 1

## 2024-10-09 MED ORDER — BOSUTINIB 400 MG PO TABS
400.0000 mg | ORAL_TABLET | Freq: Every day | ORAL | Status: DC
  Administered 2024-10-09 – 2024-10-10 (×2): 400 mg via ORAL
  Filled 2024-10-09 (×4): qty 1

## 2024-10-09 NOTE — Progress Notes (Signed)
 Pt states he had a small BM 12/13 and is requesting Dulcolax Pills as needed for constipation. He has Miralax  daily and Senokot BID scheduled. Pt states the dulcolax pills works well for him.

## 2024-10-09 NOTE — Telephone Encounter (Signed)
 RN called Prentice back to pass [place], [place] message along, verbalized understanding and appreciation.

## 2024-10-09 NOTE — Progress Notes (Signed)
 Mobility Specialist Progress Note:   10/09/24 1040  Mobility  Activity Ambulated with assistance  Level of Assistance Contact guard assist, steadying assist  Assistive Device Front wheel walker  Distance Ambulated (ft) 300 ft  Activity Response Tolerated well  Mobility Referral Yes  Mobility visit 1 Mobility  Mobility Specialist Start Time (ACUTE ONLY) 1027  Mobility Specialist Stop Time (ACUTE ONLY) 1040  Mobility Specialist Time Calculation (min) (ACUTE ONLY) 13 min   Pt received in chair agreeable to mobility. No physical assistance required, contact guard for safety. During ambulate pt had x3 instances of HR increasing to 140 but pt had no c/o. Returned to room w/o fault. Left in chair w/ call bell and personal belongings in reach. All needs met.   Pre Mobility HR 96 During Mobility HR 103-140 Post Mobility HR 106  [name] Mobility Specialist  Please contact vis Secure Chat or  Rehab Office [phone]

## 2024-10-09 NOTE — TOC Progression Note (Signed)
 Transition of Care [facility]) - Progression Note    Patient Details  Name: Lance Hubbard MRN: [id number] Date of Birth: December 25, 1940  Transition of [facility]) CM/SW Contact  Graves-Bigelow, Erminio Deems, RN Phone Number: 10/09/2024, 2:48 PM  Clinical Narrative:  Patient was discussed in progression rounds. Patient continues to be IV diuresed. ICM will continue to follow for additional home needs.   Expected Discharge Plan: Home/Self Care Barriers to Discharge: Continued Medical Work up  Expected Discharge Plan and Services In-house Referral: NA Discharge Planning Services: CM Consult   Living arrangements for the past 2 months: Single Family Home                   DME Agency: NA       HH Arranged: NA   Social Drivers of Health (SDOH) Interventions SDOH Screenings   Food Insecurity: No Food Insecurity (09/28/2024)  Housing: Low Risk (09/28/2024)  Transportation Needs: No Transportation Needs (09/28/2024)  Utilities: Not At Risk (09/28/2024)  Social Connections: Socially Integrated (09/28/2024)  Tobacco Use: Medium Risk (10/04/2024)   Readmission Risk Interventions    09/28/2024    3:55 PM  Readmission Risk Prevention Plan  Transportation Screening Complete  HRI or Home Care Consult Complete  Social Work Consult for Recovery Care Planning/Counseling Complete  Palliative Care Screening Not Applicable  Medication Review Oceanographer) Referral to Pharmacy

## 2024-10-09 NOTE — Progress Notes (Signed)
 Progress Note  Patient Name: Lance Hubbard Date of Encounter: 10/09/2024 [facility] Cardiologist: Wilbert Bihari, MD   Interval Summary   Denies any chest pain.  Reports that he continues to have swelling in his lower extremities and abdomen.  Vital Signs Vitals:   10/08/24 2037 10/09/24 0009 10/09/24 0458 10/09/24 0825  BP:  101/75 106/87 113/88  Pulse: 100 (!) 112 (!) 101 85  Resp: 18 18 18 18   Temp: 99.4 F (37.4 C) 99.3 F (37.4 C) 97.7 F (36.5 C) 97.8 F (36.6 C)  TempSrc: Oral Oral Oral Oral  SpO2: 95% 97% 99% 98%  Weight:   103.2 kg   Height:        Intake/Output Summary (Last 24 hours) at 10/09/2024 0918 Last data filed at 10/09/2024 0826 Gross per 24 hour  Intake 600 ml  Output 1700 ml  Net -1100 ml      10/09/2024    4:58 AM 10/08/2024    5:19 AM 10/07/2024    4:38 AM  Last 3 Weights  Weight (lbs) 227 lb 8 oz 229 lb 12.8 oz 233 lb 3.2 oz  Weight (kg) 103.193 kg 104.237 kg 105.779 kg      Telemetry/ECG  Atrial fibrillation with ventricular rates in the 100s to 110s.- Personally Reviewed  Physical Exam  GEN: No acute distress.   Neck: No JVD Cardiac: Irregularly irregular rhythm, no murmurs, rubs, or gallops.  Respiratory: Minimal bibasilar crackles GI: Soft, nontender, non-distended  MS: 2+ bilateral lower extremity edema.  Assessment & Plan  Patient with past medical history significant for atrial fibrillation status post PVI 05/2021, hypothyroidism, hypertension, CML, hyperlipidemia, tobacco use. Patient admitted for sepsis secondary to biliary pancreatitis status post cholecystectomy. Cardiology following for recurrence of atrial fibrillation and acute HFrEF.   Acute on chronic HFrEF  Pulmonary hypertension Hypoalbuminemia  TTE on 08/2024 showed reduced LVEF of 30 to 35%, normal RV systolic function, elevated RV systolic pressure of 65 mmHg, and mild aortic valve calcification with normal function.  Prior to this echo showed a normal  LVEF.  Has a history of heart failure with recovered LVEF.  Suspect heart failure may be secondary to A-fib with RVR and sepsis. Is net out 7.6 L since admission. Labs on 10/07/2024 showed albumin of 2.8.  This is likely contributing to edema.  On 10/08/2024 BNP was elevated at 694.6.  Labs today showed potassium of 3.9, magnesium  of 2.1, and creatinine of 1.33.  Patient reported that his baseline weight was 220 pounds.  His most recent weight was 227.5 pounds.  I's and O's are net out 7.6 L.  Was started on IV Lasix . Continue IV Lasix  80 mg daily. May consider right heart cath to assess pulmonary hypertension. Needs repeat TTE to reassess LVEF in 2 to 3 months.  May consider ischemic evaluation at that time if remains reduced. GDMT -has been limited by hypotension.  Hide to admission was on Entresto  but this was held.  Will prioritize rate control given patient also has A-fib with RVR. Continue Toprol  50 mg  Continue Spironolactone  12.5 mg    A-fib with RVR CHA2DS2-VASc Score = 4 [CHF History: 1, HTN History: 1, Diabetes History: 0, Stroke History: 0, Vascular Disease History: 0, Age Score: 2, Gender Score: 0].  Therefore, the patient's annual risk of stroke is 4.8 %.    Was initially diagnosed in 2015.  Had an ablation on 05/2021.  Had a cardioversion on 12/2023. Patient is currently a poor candidate for  a cardioversion/TEE cardioversion given that there are ongoing concerns for GI bleed.  Ventricular rates on telemetry are in the 100s to 110s. Plan to schedule follow-up appointment with A-fib clinic.  May consider cardioversion at that time if hemoglobin stabilizes and there are no concerns of GI bleed. Continue Eliquis  5 mg twice daily. Continue amiodarone  200 mg twice daily. Continue metoprolol  succinate 50 mg twice daily   Anemia Hemoglobin this a.m. was 8.5.  And initially declined after the patient was started on Eliquis . EGD showed no gross lesions in esophagus, 3 cm hiatal hernia,  patent sphincterotomy with congestion and ulceration.   CAD Hyperlipidemia Cardiac CT on 04/2021 showed a coronary calcium  score of 419 (55th percentile).  Continue Crestor  5 mg daily.   Pleural effusion Had right thoracentesis on 10/02/24 that removed 1.4 L of fluid.   Otherwise management per primary.    For questions or updates, please contact [facility] Please consult [email or web address] for contact info under        Signed, Anthea Udovich, PA-C

## 2024-10-09 NOTE — Progress Notes (Signed)
 Progress Note   Patient: Lance Hubbard FMW:[id number] DOB: Aug 13, 1941 DOA: 09/27/2024     11 DOS: the patient was seen and examined on 10/09/2024   Brief hospital course: Mr. Benedick was admitted to the hospital with the working diagnosis of choledocholithiasis sp cholecystectomy, complicated with bilary leak.   83 y.o. male with medical history significant for hypertension, chronic atrial fibrillation, CML and osteoarthritis, who presented to the ER with acute onset of severe upper abdominal pain with associated hypotension.  11/28 Laparoscopic cholecystectomy. At home patient having poor appetite, and persistent abdominal pain. On the day of admission he had severe pain after having breakfast, epigastric and radiated to the right upper quadrant. Because severe symptoms EMS was called, he was found in atrial fibrillation with RVR at 130 bpm, he received IV fentanyl , IV fluids 500 cc and was transported to the ED.  On his initial physical examination his blood pressure was 118/77, HR 171, RR 26.  Lungs with no wheezing or rhonchi, heart with S1 and S2 present, tachycardic, irregularly irregular with no gallops, rubs or murmurs, respiratory with no wheezing or rhonchi, abdomen with tenderness at the epigastrium and right upper quadrant, with no rebound or guarding, no lower extremity edema.   Labs revealed lipase 146 AST 69 ALT 102, total bili of 3.9 up from 1.4, WBC 16.8.  EKG showed atrial fibrillation with RVR, PVCs and low voltage QRS.    CT abdomen pelvis showed 2 stones in the distal CBD, with third gallstone within the duodenum all measuring about 6 mm, noted interval change of cholecystectomy with a 6.7 x 2.1 cm fluid collection in the gallbladder fossa, adjacent fat stranding, and fluid tracking inferiorly in the pericolic gutter. Findings may be postoperative however are concerning for developing abscess vs concern for biliary leak, HIDA scan is recommended for further evaluation. Moderate  right pleural effusion and atelectasis or pneumonia in the right lower lobe, moderate to large colonic stool burden.  Cardiology, GI and general surgery consulted.  Patient admitted for further management.   Patient was placed on antibiotic therapy.  12/05 ERCP with choledocholithiasis, underwent complete removal accomplished by biliary sphincterotomy. Bile leak was found. One temporary plastic pancreatic stent was placed into the ventral pancreatic duct. Biliary tree was swept.  One plastic biliary stent was placed into the common bile duct.   12/08 thoracentesis 1.4 L amber fluid removed.  12/10 persistent anemia, underwent EGD, with no signs of acute bleeding. 12/11 changed antibiotic therapy to po.  12/12 resume heparin  IV for anticoagulation  12/13 transition to po apixaban , and po pantoprazole .  12/14 completed antibiotic therapy for 12 days.  12/15 improved volume status but not yet back to baseline   Assessment and Plan: * Choledocholithiasis SP stone extraction by ERCP, noted bile leak and stents placed Sepsis present on admission, now resolved.   12/10 EGD with no active bleeding, pancreatic stent was pulled.  Patient will need repeat ERCP in 2 months for assessment of the bile leak and pulling common bile duct stent if it has healed.   Patient is tolerating po well, has no fever and wbc is 10.8 Will discontinue antibiotic therapy, he had 12 days of effective antibiotic therapy.   Continue with pantoprazole  to po   Acute post operative anemia, follow up hgb at 8.5  No current indication for transfusion.   Paroxysmal atrial fibrillation (HCC) Atrial fibrillation rhythm.  Continue with metoprolol  succinate to 50 mg bid Continue with amiodarone    Anticoagulation with apixaban .  Continue telemetry monitoring   Acute on chronic systolic CHF (congestive heart failure) (HCC) Limited echocardiogram with reduced LV systolic function 30 to 35%, global hypokinesis, RV systolic  function preserved. LA with moderate dilation,   Urine output is 1,700 ml Systolic blood pressure 110 mmHg range.   Continue with IV furosemide  80 mg IV daily. Continue with spironolactone  for RAAS inhibition. Metoprolol  succinate  Acute cardiogenic pulmonary edema, with right pleural effusion.  Chest radiograph 12/12 with positive cardiomegaly, bilateral hilar vascular congestion and small right pleural effusion.  Today 02 saturation is 97% on room air.   HTN (hypertension) Continue blood pressure monitoring  Continue metoprolol  50 mg succinate bid .   Coronary artery calcification seen on CAT scan No chest pain, no acute coronary syndrome   AKI (acute kidney injury) Hyponatremia, hypokalemia.   Today renal function with serum cr at 1,33 with K at 3,9 and serum bicarbonate at 29  Na 138 and Mg 2.1   Plan to continue diuresis and follow up renal function and electrolytes in am.   Dyslipidemia Continue statin therapy  Hypothyroidism Continue levothyroxine    CML (chronic myelocytic leukemia) (HCC) Resume bosutinib now that infection has resolved.         Subjective: Patient is feeling better, he has no dyspnea or PND, no orthopnea, lower extremity edema has improved but not yet back to baseline   Physical Exam: Vitals:   10/08/24 2037 10/09/24 0009 10/09/24 0458 10/09/24 0825  BP:  101/75 106/87 113/88  Pulse: 100 (!) 112 (!) 101 85  Resp: 18 18 18 18   Temp: 99.4 F (37.4 C) 99.3 F (37.4 C) 97.7 F (36.5 C) 97.8 F (36.6 C)  TempSrc: Oral Oral Oral Oral  SpO2: 95% 97% 99% 98%  Weight:   103.2 kg   Height:       Neurology awake and alert ENT with mild pallor with no icterus Cardiovascular with S1 and S2 present, irregularly irregular with no gallops or rubs, positive systolic murmur at the left lower sternal border Respiratory with no rales or wheezing, no rhonchi  Abdomen soft and non tender, not distended Positive lower extremity edema +   Data  Reviewed:    Family Communication: I spoke with patient's wife at the bedside, we talked in detail about patient's condition, plan of care and prognosis and all questions were addressed.   Disposition: Status is: Inpatient Remains inpatient appropriate because: pending clinicall improvement   Planned Discharge Destination: Home     Author: Elidia Toribio Furnace, MD 10/09/2024 9:16 AM  For on call review [email or web address].

## 2024-10-09 NOTE — Progress Notes (Signed)
 PT Cancellation Note  Patient Details Name: Lance Hubbard MRN: [id number] DOB: 12-14-1940   Cancelled Treatment:    Reason Eval/Treat Not Completed: Fatigue/lethargy limiting ability to participate. PT attempted session for gait and stair training, pt declining and requesting to rest as he is fatigued from walking with Mobility Tech. PT to follow up as time allows.  Isaiah DEL. Caroline Matters, PT, DPT  [employer] 10/09/2024, 12:41 PM

## 2024-10-09 NOTE — Progress Notes (Signed)
 Physical Therapy Treatment Patient Details Name: Lance Hubbard MRN: [id number] DOB: 03-Jul-1941 Today's Date: 10/09/2024   History of Present Illness 83 y.o. male admitted 09/27/24 with worsening abdominal pain. Abdominal CT showing multiple gallstones, cholecystectomy changes concerning for abscess vs biliary leak; potential ileus. S/p ERCP 12/5. Course complicated by afib. Of note, s/p lap chole 11/28 for gallstone pancreatitis. Other PMH includes CHF, HTN, CML, afib.    PT Comments  Patient agreeable to session focused on continued strength and endurance gains with functional mobility. He is now MOD I with bed mobility and CGA for transfers and ambulation with RW with significant improvements to his activity tolerance and balance. He declined practicing stairs during today's session. After ambulation HR elevated to 120-130 and recovering with seated rest. Left up in chair, PT will continue to follow. No changes to d/c recs.    If plan is discharge home, recommend the following: A little help with bathing/dressing/bathroom;Assistance with cooking/housework;Assist for transportation;Help with stairs or ramp for entrance   Can travel by private vehicle        Equipment Recommendations  Rollator (4 wheels)    Recommendations for Other Services       Precautions / Restrictions Precautions Precautions: Fall;Other (comment) Recall of Precautions/Restrictions: Intact Precaution/Restrictions Comments: monitor HR with activity Restrictions Weight Bearing Restrictions Per Provider Order: No     Mobility  Bed Mobility Overal bed mobility: Modified Independent Bed Mobility: Supine to Sit   Sidelying to sit: Modified independent (Device/Increase time), HOB elevated, Used rails            Transfers Overall transfer level: Needs assistance Equipment used: Rolling walker (2 wheels) Transfers: Sit to/from Stand Sit to Stand: Contact guard assist           General transfer  comment: VC for proper hand placement to assist with power to stand    Ambulation/Gait Ambulation/Gait assistance: Supervision Gait Distance (Feet): 200 Feet Assistive device: Rolling walker (2 wheels) Gait Pattern/deviations: Step-through pattern, Decreased stride length, Trunk flexed, Wide base of support       General Gait Details: improved tolerance to gait, improve endurance and balance noted; for 24ft pt picked up RW with BUE and carried it for additional challenge, no balance deficits noted, notable increase in sound at heel strike with cues for soften steps for comfort   Stairs             Wheelchair Mobility     Tilt Bed    Modified Rankin (Stroke Patients Only)       Balance Overall balance assessment: Needs assistance Sitting-balance support: Feet supported, No upper extremity supported Sitting balance-Leahy Scale: Normal Sitting balance - Comments: seated at EOB, no balance deficits noted   Standing balance support: During functional activity Standing balance-Leahy Scale: Good Standing balance comment: able to walk with RW and while carrying device with no LOB or change in stability, however pt not willing to attempt walking without device at all or HHA despite improved balance and endurance. Will continue to benefit from dynamic standing balance challenges                            Communication    Cognition Arousal: Alert Behavior During Therapy: [facility] for tasks assessed/performed   PT - Cognitive impairments: No apparent impairments                         Following commands:  Intact      Cueing Cueing Techniques: Verbal cues  Exercises      General Comments        Pertinent Vitals/Pain Pain Assessment Pain Assessment: No/denies pain    Home Living                          Prior Function            PT Goals (current goals can now be found in the care plan section) Acute Rehab PT Goals Patient  Stated Goal: regain strength PT Goal Formulation: With patient Time For Goal Achievement: 10/15/24 Potential to Achieve Goals: Good Progress towards PT goals: Progressing toward goals    Frequency    Min 2X/week      PT Plan      Co-evaluation              AM-PAC PT 6 Clicks Mobility   Outcome Measure  Help needed turning from your back to your side while in a flat bed without using bedrails?: None Help needed moving from lying on your back to sitting on the side of a flat bed without using bedrails?: None Help needed moving to and from a bed to a chair (including a wheelchair)?: A Little Help needed standing up from a chair using your arms (e.g., wheelchair or bedside chair)?: A Little Help needed to walk in hospital room?: A Little Help needed climbing 3-5 steps with a railing? : A Little 6 Click Score: 20    End of Session Equipment Utilized During Treatment: Gait belt Activity Tolerance: Patient tolerated treatment well Patient left: in chair;with call bell/phone within reach;with family/visitor present Nurse Communication: Mobility status PT Visit Diagnosis: Other abnormalities of gait and mobility (R26.89)     Time: 1547-1600 PT Time Calculation (min) (ACUTE ONLY): 13 min  Charges:    $Gait Training: 8-22 mins                       Isaiah DEL. Ellery Tash, PT, DPT   [employer] 10/09/2024, 4:10 PM

## 2024-10-10 DIAGNOSIS — I5023 Acute on chronic systolic (congestive) heart failure: Secondary | ICD-10-CM | POA: Diagnosis not present

## 2024-10-10 DIAGNOSIS — I480 Paroxysmal atrial fibrillation: Secondary | ICD-10-CM | POA: Diagnosis not present

## 2024-10-10 DIAGNOSIS — K8050 Calculus of bile duct without cholangitis or cholecystitis without obstruction: Secondary | ICD-10-CM | POA: Diagnosis not present

## 2024-10-10 DIAGNOSIS — E039 Hypothyroidism, unspecified: Secondary | ICD-10-CM | POA: Diagnosis not present

## 2024-10-10 LAB — CBC
HCT: 28.4 % — ABNORMAL LOW (ref 39.0–52.0)
Hemoglobin: 9.3 g/dL — ABNORMAL LOW (ref 13.0–17.0)
MCH: 29.8 pg (ref 26.0–34.0)
MCHC: 32.7 g/dL (ref 30.0–36.0)
MCV: 91 fL (ref 80.0–100.0)
Platelets: 359 K/uL (ref 150–400)
RBC: 3.12 MIL/uL — ABNORMAL LOW (ref 4.22–5.81)
RDW: 15.5 % (ref 11.5–15.5)
WBC: 12.7 K/uL — ABNORMAL HIGH (ref 4.0–10.5)
nRBC: 0 % (ref 0.0–0.2)

## 2024-10-10 LAB — BASIC METABOLIC PANEL WITH GFR
Anion gap: 14 (ref 5–15)
BUN: 25 mg/dL — ABNORMAL HIGH (ref 8–23)
CO2: 27 mmol/L (ref 22–32)
Calcium: 8.7 mg/dL — ABNORMAL LOW (ref 8.9–10.3)
Chloride: 97 mmol/L — ABNORMAL LOW (ref 98–111)
Creatinine, Ser: 1.26 mg/dL — ABNORMAL HIGH (ref 0.61–1.24)
GFR, Estimated: 57 mL/min — ABNORMAL LOW (ref >60)
Glucose, Bld: 97 mg/dL (ref 70–99)
Potassium: 3.8 mmol/L (ref 3.5–5.1)
Sodium: 138 mmol/L (ref 135–145)

## 2024-10-10 LAB — MAGNESIUM: Magnesium: 2.1 mg/dL (ref 1.7–2.4)

## 2024-10-10 MED ORDER — TORSEMIDE 20 MG PO TABS
80.0000 mg | ORAL_TABLET | Freq: Every day | ORAL | Status: DC
  Administered 2024-10-10: 13:00:00 80 mg via ORAL
  Filled 2024-10-10: qty 4

## 2024-10-10 MED ORDER — FUROSEMIDE 40 MG PO TABS: 40.0000 mg | ORAL_TABLET | Freq: Every day | ORAL | Status: DC

## 2024-10-10 MED ORDER — METOPROLOL SUCCINATE ER 50 MG PO TB24
75.0000 mg | ORAL_TABLET | Freq: Two times a day (BID) | ORAL | Status: DC
  Administered 2024-10-10 – 2024-10-11 (×2): 75 mg via ORAL
  Filled 2024-10-10 (×2): qty 1

## 2024-10-10 MED ORDER — FUROSEMIDE 10 MG/ML IJ SOLN
INTRAMUSCULAR | Status: AC
  Filled 2024-10-10: qty 8

## 2024-10-10 MED ORDER — POTASSIUM CHLORIDE 20 MEQ PO PACK
40.0000 meq | PACK | Freq: Once | ORAL | Status: AC
  Administered 2024-10-10: 10:00:00 40 meq via ORAL
  Filled 2024-10-10: qty 2

## 2024-10-10 NOTE — Progress Notes (Signed)
 Progress Note   Patient: Lance Hubbard FMW:[id number] DOB: 1940/12/11 DOA: 09/27/2024     12 DOS: the patient was seen and examined on 10/10/2024   Brief hospital course: Lance Hubbard was admitted to the hospital with the working diagnosis of choledocholithiasis sp cholecystectomy, complicated with bilary leak.   83 y.o. male with medical history significant for hypertension, chronic atrial fibrillation, CML and osteoarthritis, who presented to the ER with acute onset of severe upper abdominal pain with associated hypotension.  11/28 Laparoscopic cholecystectomy. At home patient having poor appetite, and persistent abdominal pain. On the day of admission he had severe pain after having breakfast, epigastric and radiated to the right upper quadrant. Because severe symptoms EMS was called, he was found in atrial fibrillation with RVR at 130 bpm, he received IV fentanyl , IV fluids 500 cc and was transported to the ED.  On his initial physical examination his blood pressure was 118/77, HR 171, RR 26.  Lungs with no wheezing or rhonchi, heart with S1 and S2 present, tachycardic, irregularly irregular with no gallops, rubs or murmurs, respiratory with no wheezing or rhonchi, abdomen with tenderness at the epigastrium and right upper quadrant, with no rebound or guarding, no lower extremity edema.   Labs revealed lipase 146 AST 69 ALT 102, total bili of 3.9 up from 1.4, WBC 16.8.  EKG showed atrial fibrillation with RVR, PVCs and low voltage QRS.    CT abdomen pelvis showed 2 stones in the distal CBD, with third gallstone within the duodenum all measuring about 6 mm, noted interval change of cholecystectomy with a 6.7 x 2.1 cm fluid collection in the gallbladder fossa, adjacent fat stranding, and fluid tracking inferiorly in the pericolic gutter. Findings may be postoperative however are concerning for developing abscess vs concern for biliary leak, HIDA scan is recommended for further evaluation. Moderate  right pleural effusion and atelectasis or pneumonia in the right lower lobe, moderate to large colonic stool burden.  Cardiology, GI and general surgery consulted.  Patient admitted for further management.   Patient was placed on antibiotic therapy.  12/05 ERCP with choledocholithiasis, underwent complete removal accomplished by biliary sphincterotomy. Bile leak was found. One temporary plastic pancreatic stent was placed into the ventral pancreatic duct. Biliary tree was swept.  One plastic biliary stent was placed into the common bile duct.   12/08 thoracentesis 1.4 L amber fluid removed.  12/10 persistent anemia, underwent EGD, with no signs of acute bleeding. 12/11 changed antibiotic therapy to po.  12/12 resume heparin  IV for anticoagulation  12/13 transition to po apixaban , and po pantoprazole .  12/14 completed antibiotic therapy for 12 days.  12/15 improved volume status but not yet back to baseline  12/16 transitioned to po loop diuretic possible discharge home tomorrow.   Assessment and Plan: * Choledocholithiasis SP stone extraction by ERCP, noted bile leak and stents placed Sepsis present on admission, now resolved.   12/10 EGD with no active bleeding, pancreatic stent was pulled.  Patient will need repeat ERCP in 2 months for assessment of the bile leak and pulling common bile duct stent if it has healed.   Patient is tolerating po well He had completed 12 days of effective antibiotic therapy.   Continue with pantoprazole  to po   Acute post operative anemia, follow up hgb at 9,3  No current indication for transfusion.   Paroxysmal atrial fibrillation (HCC) Atrial fibrillation rhythm.  Continue with metoprolol  succinate to 50 mg bid Continue with amiodarone    Anticoagulation with  apixaban .  Continue telemetry monitoring   Acute on chronic systolic CHF (congestive heart failure) (HCC) Limited echocardiogram with reduced LV systolic function 30 to 35%, global  hypokinesis, RV systolic function preserved. LA with moderate dilation,   Urine output is 1,200 ml Systolic blood pressure 110 mmHg range.   Continue with spironolactone  for RAAS inhibition. Metoprolol  succinate Transitioned to torsemide  80 mg po daily   Acute cardiogenic pulmonary edema, with right pleural effusion.  Chest radiograph 12/12 with positive cardiomegaly, bilateral hilar vascular congestion and small right pleural effusion.  Today 02 saturation is 98% on room air.   HTN (hypertension) Continue blood pressure monitoring  Continue metoprolol  50 mg succinate bid .   Coronary artery calcification seen on CAT scan No chest pain, no acute coronary syndrome   AKI (acute kidney injury) Hyponatremia, hypokalemia.   Renal function with serum cr at 1,26 with K at 3,8 and serum bicarbonate at 27  Na 138 and Mg 2.1   Plan to continue diuresis and follow up renal function and electrolytes in am.   Dyslipidemia Continue statin therapy  Hypothyroidism Continue levothyroxine    CML (chronic myelocytic leukemia) (HCC) Resume bosutinib now that infection has resolved.      Subjective: patient with no chest pain, no abdominal pain, no nausea or vomiting, tolerating po well, edema has been improving, continue very weak and deconditioned   Physical Exam: Vitals:   10/10/24 0026 10/10/24 0500 10/10/24 0805 10/10/24 0810  BP: 100/74 106/84 113/84   Pulse: 99 (!) 105 (!) 104   Resp: 18 18 18    Temp:  97.6 F (36.4 C) 97.8 F (36.6 C)   TempSrc:  Oral Oral   SpO2: 99% 97% 98%   Weight:    101.9 kg  Height:       Neurology awake and alert, deconditioned ENT with mild pallor Cardiovascular with S1 and S2 present, irregularly irregular with no gallops, rubs or murmur Respiratory with no rales or wheezing, no rhonchi, mild decreased breath sounds at the right base Abdomen with no distention, soft and non tender Lower extremity edema +  Data Reviewed:    Family  Communication: I spoke with patient's wife at the bedside, we talked in detail about patient's condition, plan of care and prognosis and all questions were addressed.   Disposition: Status is: Inpatient Remains inpatient appropriate because: diuresing   Planned Discharge Destination: Home     Author: Elidia Toribio Furnace, MD 10/10/2024 10:20 AM  For on call review [email or web address].

## 2024-10-10 NOTE — Progress Notes (Signed)
 Occupational Therapy Treatment Patient Details Name: Lance Hubbard MRN: [id number] DOB: 1941/04/08 Today's Date: 10/10/2024   History of present illness 83 y.o. male admitted 09/27/24 with worsening abdominal pain. Abdominal CT showing multiple gallstones, cholecystectomy changes concerning for abscess vs biliary leak; potential ileus. S/p ERCP 12/5. Course complicated by afib. Of note, s/p lap chole 11/28 for gallstone pancreatitis. Other PMH includes CHF, HTN, CML, afib.   OT comments  Pt making good progress with mobility. Pt eager to walk in hallway, able to do so with Supervision using RW. Based on balance and endurance improvements, OT encouraged trial of typically used cane though pt declined today. Pt politely deferred ADL retraining upon return to room. Wife at bedside, supportive and able to assist pt as needed upon DC. Continue to rec HHOT at DC.  HR to 128bpm with activity, 1/4 DOE      If plan is discharge home, recommend the following:  A little help with bathing/dressing/bathroom;Assistance with cooking/housework;Help with stairs or ramp for entrance   Equipment Recommendations  None recommended by OT    Recommendations for Other Services      Precautions / Restrictions Precautions Precautions: Fall;Other (comment) Recall of Precautions/Restrictions: Intact Precaution/Restrictions Comments: monitor HR with activity Restrictions Weight Bearing Restrictions Per Provider Order: No       Mobility Bed Mobility               General bed mobility comments: in recliner on entry    Transfers Overall transfer level: Modified independent Equipment used: Rolling walker (2 wheels) Transfers: Sit to/from Stand Sit to Stand: Modified independent (Device/Increase time)                 Balance Overall balance assessment: Needs assistance Sitting-balance support: Feet supported, No upper extremity supported Sitting balance-Leahy Scale: Normal     Standing  balance support: During functional activity, No upper extremity supported, Bilateral upper extremity supported Standing balance-Leahy Scale: Fair                             ADL either performed or assessed with clinical judgement   ADL Overall ADL's : Needs assistance/impaired                 Upper Body Dressing : Set up;Sitting                   Functional mobility during ADLs: Supervision/safety;Rolling walker (2 wheels) General ADL Comments: Pt with desire to walk in hallway today. Pt noted to lift RW up during mobility though with some sway. Inquired about trying cane in pt's room per wife suggestion though pt declined. Pt also declined ADL retraining upon return to room    Extremity/Trunk Assessment Upper Extremity Assessment Upper Extremity Assessment: Overall WFL for tasks assessed;Right hand dominant   Lower Extremity Assessment Lower Extremity Assessment: Defer to PT evaluation        Vision   Vision Assessment?: No apparent visual deficits   Perception     Praxis     Communication Communication Communication: Impaired Factors Affecting Communication: Hearing impaired   Cognition Arousal: Alert Behavior During Therapy: WFL for tasks assessed/performed Cognition: No apparent impairments                               Following commands: Intact        Cueing   Cueing Techniques: Verbal  cues  Exercises      Shoulder Instructions       General Comments Wife at bedside    Pertinent Vitals/ Pain       Pain Assessment Pain Assessment: No/denies pain  Home Living                                          Prior Functioning/Environment              Frequency  Min 2X/week        Progress Toward Goals  OT Goals(current goals can now be found in the care plan section)  Progress towards OT goals: Progressing toward goals     Plan      Co-evaluation                 AM-PAC OT  6 Clicks Daily Activity     Outcome Measure   Help from another person eating meals?: None Help from another person taking care of personal grooming?: A Little Help from another person toileting, which includes using toliet, bedpan, or urinal?: A Lot Help from another person bathing (including washing, rinsing, drying)?: A Little Help from another person to put on and taking off regular upper body clothing?: A Little Help from another person to put on and taking off regular lower body clothing?: A Lot 6 Click Score: 17    End of Session Equipment Utilized During Treatment: Gait belt;Rolling walker (2 wheels)  OT Visit Diagnosis: Unsteadiness on feet (R26.81);Other abnormalities of gait and mobility (R26.89);Muscle weakness (generalized) (M62.81)   Activity Tolerance Patient tolerated treatment well   Patient Left in chair;with call bell/phone within reach;with family/visitor present   Nurse Communication          Time: [phone] OT Time Calculation (min): 12 min  Charges: OT General Charges $OT Visit: 1 Visit OT Treatments $Therapeutic Activity: 8-22 mins  Mliss NOVAK, OTR/L Acute Rehab Services Office: [phone]   Mliss Fish 10/10/2024, 10:31 AM

## 2024-10-10 NOTE — Plan of Care (Signed)

## 2024-10-10 NOTE — Progress Notes (Addendum)
 Progress Note  Patient Name: Lance Hubbard Date of Encounter: 10/10/2024 [facility] Cardiologist: Wilbert Bihari, MD   Interval Summary   Denies any chest pain, shortness of breath, orthopnea, still has some abdominal distention.  Vital Signs Vitals:   10/09/24 1519 10/09/24 2053 10/10/24 0026 10/10/24 0500  BP: 113/82 116/88 100/74 106/84  Pulse: 98 100 99 (!) 105  Resp: 18 18 18 18   Temp: 97.8 F (36.6 C) (!) 97.5 F (36.4 C)  97.6 F (36.4 C)  TempSrc: Oral Oral  Oral  SpO2: 97% 98% 99% 97%  Weight:      Height:        Intake/Output Summary (Last 24 hours) at 10/10/2024 0751 Last data filed at 10/10/2024 0500 Gross per 24 hour  Intake 360 ml  Output 1200 ml  Net -840 ml      10/09/2024    4:58 AM 10/08/2024    5:19 AM 10/07/2024    4:38 AM  Last 3 Weights  Weight (lbs) 227 lb 8 oz 229 lb 12.8 oz 233 lb 3.2 oz  Weight (kg) 103.193 kg 104.237 kg 105.779 kg      Telemetry/ECG  Atrial fibrillation with heart rates in the 90s to 100's- Personally Reviewed  Physical Exam  GEN: No acute distress.  Alert and oriented on room air Neck: No JVD Cardiac: irregularly irregular rhythm, 1/6 murmur. Respiratory: Bibasilar crackles GI: Soft, nontender, slightly distended. MS: No edema  Assessment & Plan   Patient with past medical history significant for atrial fibrillation status post PVI 05/2021, hypothyroidism, hypertension, CML, hyperlipidemia, tobacco use. Patient admitted for sepsis secondary to biliary pancreatitis status post cholecystectomy. Cardiology following for recurrence of atrial fibrillation and acute HFrEF.   Acute on chronic HFrEF  Pulmonary hypertension Hypoalbuminemia  TTE on 08/2024 showed reduced LVEF of 30 to 35%, normal RV systolic function, elevated RV systolic pressure of 65 mmHg, and mild aortic valve calcification with normal function.  Prior to this echo showed a normal LVEF.  Has a history of heart failure with recovered LVEF.   Suspect heart failure may be secondary to A-fib with RVR and sepsis. Is net out 7.6 L since admission. Labs on 10/07/2024 showed albumin of 2.8.  This is likely contributing to edema.  On 10/08/2024 BNP was elevated at 694.6.  Labs today showed potassium of 3.8, magnesium  of 2.1, and creatinine of 1.26.  Ordered potassium replacement.  Patient reported that his baseline weight was 220 pounds.  His most recent weight was 224.6 pounds.  I's and O's are net out 8.8 L.  Was started on IV Lasix .  On physical exam the patient appears slightly volume up but overall looks fairly euvolemic. Stop IV lasix   Start oral torsemide  80mg  daily Will need basic metabolic panel 1 week after discharge. Needs repeat TTE to reassess LVEF in 2 to 3 months.  May consider ischemic evaluation at that time if remains reduced. GDMT -has been limited by hypotension.  Hide to admission was on Entresto  but this was held.  Will prioritize rate control given patient also has A-fib with RVR. Continue Toprol  50 mg  Continue Spironolactone  12.5 mg      A-fib with RVR CHA2DS2-VASc Score = 4 [CHF History: 1, HTN History: 1, Diabetes History: 0, Stroke History: 0, Vascular Disease History: 0, Age Score: 2, Gender Score: 0].  Therefore, the patient's annual risk of stroke is 4.8 %.    Was initially diagnosed in 2015.  Had an ablation on 05/2021.  Had a cardioversion on 12/2023. Patient is currently a poor candidate for a cardioversion/TEE cardioversion given that there are ongoing concerns for GI bleed.  Ventricular rates on telemetry are in the 90's to 100's Plan to schedule follow-up appointment with A-fib clinic.  May consider cardioversion at that time if hemoglobin stabilizes and there are no concerns of GI bleed. Continue Eliquis  5 mg twice daily. Continue amiodarone  200 mg twice daily. Increase metoprolol  succinate 75 mg twice daily     Anemia Hemoglobin this a.m. was 8.5.  And initially declined after the patient was started  on Eliquis . EGD showed no gross lesions in esophagus, 3 cm hiatal hernia, patent sphincterotomy with congestion and ulceration.     CAD Hyperlipidemia Cardiac CT on 04/2021 showed a coronary calcium  score of 419 (55th percentile).  Continue Crestor  5 mg daily.     Pleural effusion Had right thoracentesis on 10/02/24 that removed 1.4 L of fluid.     Otherwise management per primary.    For questions or updates, please contact [facility] Please consult [email or web address] for contact info under        Signed, Taylormarie Register, PA-C

## 2024-10-11 ENCOUNTER — Other Ambulatory Visit: Payer: Self-pay | Admitting: Student

## 2024-10-11 ENCOUNTER — Other Ambulatory Visit (HOSPITAL_COMMUNITY): Payer: Self-pay

## 2024-10-11 DIAGNOSIS — I5023 Acute on chronic systolic (congestive) heart failure: Secondary | ICD-10-CM

## 2024-10-11 LAB — BASIC METABOLIC PANEL WITH GFR
Anion gap: 10 (ref 5–15)
BUN: 30 mg/dL — ABNORMAL HIGH (ref 8–23)
CO2: 31 mmol/L (ref 22–32)
Calcium: 8.9 mg/dL (ref 8.9–10.3)
Chloride: 96 mmol/L — ABNORMAL LOW (ref 98–111)
Creatinine, Ser: 1.62 mg/dL — ABNORMAL HIGH (ref 0.61–1.24)
GFR, Estimated: 42 mL/min — ABNORMAL LOW (ref >60)
Glucose, Bld: 89 mg/dL (ref 70–99)
Potassium: 4.3 mmol/L (ref 3.5–5.1)
Sodium: 137 mmol/L (ref 135–145)

## 2024-10-11 LAB — CBC
HCT: 27.9 % — ABNORMAL LOW (ref 39.0–52.0)
Hemoglobin: 9.2 g/dL — ABNORMAL LOW (ref 13.0–17.0)
MCH: 29.6 pg (ref 26.0–34.0)
MCHC: 33 g/dL (ref 30.0–36.0)
MCV: 89.7 fL (ref 80.0–100.0)
Platelets: 373 K/uL (ref 150–400)
RBC: 3.11 MIL/uL — ABNORMAL LOW (ref 4.22–5.81)
RDW: 15.6 % — ABNORMAL HIGH (ref 11.5–15.5)
WBC: 10.9 K/uL — ABNORMAL HIGH (ref 4.0–10.5)
nRBC: 0 % (ref 0.0–0.2)

## 2024-10-11 MED ORDER — TORSEMIDE 20 MG PO TABS
60.0000 mg | ORAL_TABLET | Freq: Every day | ORAL | 0 refills | Status: DC
  Filled 2024-10-11: qty 180, 60d supply, fill #0

## 2024-10-11 MED ORDER — AMIODARONE HCL 200 MG PO TABS
200.0000 mg | ORAL_TABLET | Freq: Two times a day (BID) | ORAL | 0 refills | Status: DC
  Filled 2024-10-11: qty 120, 60d supply, fill #0

## 2024-10-11 MED ORDER — SPIRONOLACTONE 25 MG PO TABS
12.5000 mg | ORAL_TABLET | Freq: Every day | ORAL | 0 refills | Status: AC
  Filled 2024-10-11: qty 30, 60d supply, fill #0

## 2024-10-11 MED ORDER — METOPROLOL SUCCINATE ER 25 MG PO TB24
75.0000 mg | ORAL_TABLET | Freq: Two times a day (BID) | ORAL | 0 refills | Status: DC
  Filled 2024-10-11: qty 360, 60d supply, fill #0

## 2024-10-11 MED ORDER — TORSEMIDE 20 MG PO TABS
60.0000 mg | ORAL_TABLET | Freq: Every day | ORAL | Status: DC
  Administered 2024-10-11: 09:00:00 60 mg via ORAL
  Filled 2024-10-11: qty 3

## 2024-10-11 MED ORDER — PANTOPRAZOLE SODIUM 40 MG PO TBEC
40.0000 mg | DELAYED_RELEASE_TABLET | Freq: Every day | ORAL | 0 refills | Status: DC
  Filled 2024-10-11: qty 60, 60d supply, fill #0

## 2024-10-11 NOTE — Progress Notes (Signed)
 Mobility Specialist Progress Note:    10/11/24 1200  Mobility  Activity Ambulated with assistance  Level of Assistance Contact guard assist, steadying assist  Assistive Device Front wheel walker  Distance Ambulated (ft) 300 ft  Activity Response Tolerated well  Mobility Referral Yes  Mobility visit 1 Mobility  Mobility Specialist Start Time (ACUTE ONLY) 1050  Mobility Specialist Stop Time (ACUTE ONLY) 1104  Mobility Specialist Time Calculation (min) (ACUTE ONLY) 14 min   Pt received in chair agreeable to mobility. No physical assistance needed. Throughout most of the session pts HR stayed around 90-120, towards EOS pt HR increased to 150 but quickly able to recover. Left patient in chair w/ call bell and personal belongings in reach. All needs met.   [name] Mobility Specialist  Please contact vis Secure Chat or  Rehab Office [phone]

## 2024-10-11 NOTE — Plan of Care (Signed)
°  Problem: Education: Goal: Knowledge of General Education information will improve Description: Including pain rating scale, medication(s)/side effects and non-pharmacologic comfort measures Outcome: Adequate for Discharge   Problem: Health Behavior/Discharge Planning: Goal: Ability to manage health-related needs will improve Outcome: Adequate for Discharge   Problem: Clinical Measurements: Goal: Ability to maintain clinical measurements within normal limits will improve Outcome: Adequate for Discharge Goal: Will remain free from infection Outcome: Adequate for Discharge Goal: Diagnostic test results will improve Outcome: Adequate for Discharge Goal: Respiratory complications will improve Outcome: Adequate for Discharge Goal: Cardiovascular complication will be avoided Outcome: Adequate for Discharge   Problem: Activity: Goal: Risk for activity intolerance will decrease Outcome: Adequate for Discharge   Problem: Nutrition: Goal: Adequate nutrition will be maintained Outcome: Adequate for Discharge   Problem: Coping: Goal: Level of anxiety will decrease Outcome: Adequate for Discharge   Problem: Elimination: Goal: Will not experience complications related to bowel motility Outcome: Adequate for Discharge Goal: Will not experience complications related to urinary retention Outcome: Adequate for Discharge   Problem: Pain Managment: Goal: General experience of comfort will improve and/or be controlled Outcome: Adequate for Discharge   Problem: Safety: Goal: Ability to remain free from injury will improve Outcome: Adequate for Discharge   Problem: Skin Integrity: Goal: Risk for impaired skin integrity will decrease Outcome: Adequate for Discharge   Problem: Education: Goal: Knowledge of disease or condition will improve Outcome: Adequate for Discharge Goal: Understanding of medication regimen will improve Outcome: Adequate for Discharge Goal: Individualized  Educational Video(s) Outcome: Adequate for Discharge   Problem: Activity: Goal: Ability to tolerate increased activity will improve Outcome: Adequate for Discharge   Problem: Cardiac: Goal: Ability to achieve and maintain adequate cardiopulmonary perfusion will improve Outcome: Adequate for Discharge   Problem: Health Behavior/Discharge Planning: Goal: Ability to safely manage health-related needs after discharge will improve Outcome: Adequate for Discharge   Problem: Acute Rehab OT Goals (only OT should resolve) Goal: Pt. Will Perform Lower Body Bathing Outcome: Adequate for Discharge Goal: Pt. Will Perform Lower Body Dressing Outcome: Adequate for Discharge Goal: Pt. Will Transfer To Toilet Outcome: Adequate for Discharge   Problem: Acute Rehab PT Goals(only PT should resolve) Goal: Patient Will Transfer Sit To/From Stand Outcome: Adequate for Discharge Goal: Pt Will Ambulate Outcome: Adequate for Discharge Goal: Pt Will Go Up/Down Stairs Outcome: Adequate for Discharge

## 2024-10-11 NOTE — TOC Transition Note (Signed)
 Transition of Care [facility]) - Discharge Note   Patient Details  Name: Lance Hubbard MRN: [id number] Date of Birth: 28-Aug-1941  Transition of Care [facility]) CM/SW Contact:  Sudie Erminio Deems, RN Phone Number: 10/11/2024, 1:16 PM   Clinical Narrative: Patient will discharge home today with support of spouse. No home needs identified.     Final next level of care: Home/Self Care Barriers to Discharge: No Barriers Identified   Patient Goals and CMS Choice Patient states their goals for this hospitalization and ongoing recovery are:: Plans to retutrn home once stable  Discharge Plan and Services Additional resources added to the After Visit Summary for   In-house Referral: NA Discharge Planning Services: CM Consult              DME Agency: NA        Social Drivers of Health (SDOH) Interventions SDOH Screenings   Food Insecurity: No Food Insecurity (09/28/2024)  Housing: Low Risk (09/28/2024)  Transportation Needs: No Transportation Needs (09/28/2024)  Utilities: Not At Risk (09/28/2024)  Social Connections: Socially Integrated (09/28/2024)  Tobacco Use: Medium Risk (10/04/2024)     Readmission Risk Interventions    09/28/2024    3:55 PM  Readmission Risk Prevention Plan  Transportation Screening Complete  HRI or Home Care Consult Complete  Social Work Consult for Recovery Care Planning/Counseling Complete  Palliative Care Screening Not Applicable  Medication Review [employer]) Referral to Pharmacy

## 2024-10-11 NOTE — Discharge Summary (Signed)
 DISCHARGE SUMMARY  Lance Hubbard  MR#: [id number]  DOB:06-24-1941  Date of Admission: 09/27/2024 Date of Discharge: 10/11/2024  Attending Physician:Makynli Stills ONEIDA Moores, MD  Patient's ERE:Uyjrxzm, Lamar, MD  Disposition: D/C home   Follow-up Appts:  Follow-up Information     Frederik Lamar, MD Follow up in 5 day(s).   Specialty: Family Medicine Why: You will need to have some blood work checked to follow up on your blood chemistries as well as your kidney function. Contact information: [address] [place] [address] [phone]                 Tests Needing Follow-up: -repeat EGD needed in 2 months for re-evaluation of biliary tree and possible stent removal  -recheck of renal function is suggested in 3-5 days   Discharge Diagnoses: Choledocholithiasis following recent laparoscopic cholecystectomy (09/22/2024) Acute postoperative anemia Paroxysmal atrial fibrillation Acute exacerbation of chronic systolic CHF Acute cardiogenic pulmonary edema with large right pleural effusion HTN Coronary artery calcification noted on CT scan Acute kidney injury Hyperlipidemia Hypothyroidism Chronic myelocytic leukemia  Initial presentation: 83 year old with a history of HTN, chronic atrial fibrillation, CML, and osteoarthritis who presented to the ER 12/3 with the acute onset of severe abdominal pain with associated hypotension. He had undergone a laparoscopic cholecystectomy 09/22/2024 and after being discharged home had noted a very poor appetite and persisting abdominal pain. On presentation to the ER 12/3 he was found to be in A-fib with RVR at 130. CT abdomen/pelvis at that time noted 2 stones in the distal common bile duct with 1/3 stone within the duodenum all measuring about 6 mm as well as a 6.7 x 2.1 cm fluid collection in the gallbladder fossa concerning for developing abscess versus biliary leak. Patient was placed on antibiotics empirically and  admitted to the hospital.   Hospital Course: 12/5 ERCP noted choledocholithiasis with stone removal with biliary sphincterotomy and confirmation of bile leak with a temporary plastic pancreatic stent placed into the ventral pancreatic duct and a plastic biliary stent placed in the common bile duct 12/08 thoracentesis 1.4 L amber fluid removed.  12/10 persistent anemia, underwent EGD > no signs of acute bleeding 12/11 changed antibiotic therapy to po 12/12 resume heparin  IV for anticoagulation  12/13 transitioned to po apixaban , and po pantoprazole  12/14 completed antibiotic therapy for 12 days.  12/15 improved volume status but not yet back to baseline  12/16 transitioned to po loop diuretic - possible discharge home tomorrow  Choledocholithiasis following recent laparoscopic cholecystectomy (09/22/2024) Status post stone extraction per ERCP 12/5 with noted bile leak and stent placement - follow-up EGD 12/10 with no evidence of upper GI bleeding and pulling of pancreatic stent -GI reports the patient will need a repeat ERCP in 2 months for reassessment of his bile leak and to consider pulling the common bile duct stent -has tolerated p.o. well -has completed 12 days of antibiotic therapy during this admit    Acute postoperative anemia Did not reach threshold for transfusion -no evidence of bleeding on follow-up EGD -hemoglobin stable at time of d/c    Paroxysmal atrial fibrillation Continue metoprolol  50 mg twice daily -continue usual amiodarone  dose - continue apixaban  - HR controlled at time of d/c    Acute exacerbation of chronic systolic CHF TTE noted EF 30-35% with global hypokinesis and RV systolic function preserved - continue Aldactone  and metoprolol  - transitioned from IV diuretic to torsemide  80 mg daily prior to discharge - volume status stable at  time of d/c    Acute cardiogenic pulmonary edema with large right pleural effusion Status post thoracentesis 12/8 -stable after  diuresis with oxygen saturation of 98-100% on room air   HTN BP well-controlled   Coronary artery calcification noted on CT scan No acute coronary symptoms during this admission - outpatient follow-up   Acute kidney injury Due to diuresis with creatinine variable from 1.09-1.62 - outpatient follow-up will be suggested - diuretic to be held for 48hrs at time of d/c    Hyperlipidemia Continue statin therapy   Hypothyroidism Continue Synthroid    Chronic myelocytic leukemia Resume usual bosutinib  Allergies as of 10/11/2024       Reactions   Amoxicillin Hives, Other (See Comments)   REACTION: whelps/pain in knees and ankles   Penicillin G    Other reaction(s): hives        Medication List     STOP taking these medications    ciprofloxacin  500 MG tablet Commonly known as: CIPRO    clobetasol cream 0.05 % Commonly known as: TEMOVATE   docusate sodium  100 MG capsule Commonly known as: Colace   Entresto  49-51 MG Generic drug: sacubitril-valsartan   metroNIDAZOLE  500 MG tablet Commonly known as: FLAGYL        TAKE these medications    acetaminophen  325 MG tablet Commonly known as: TYLENOL  Take 2 tablets (650 mg total) by mouth every 6 (six) hours as needed for mild pain (pain score 1-3) or moderate pain (pain score 4-6).   amiodarone  200 MG tablet Commonly known as: PACERONE  Take 1 tablet (200 mg total) by mouth 2 (two) times daily.   apixaban  5 MG Tabs tablet Commonly known as: ELIQUIS  Take 1 tablet (5 mg total) by mouth every 12 (twelve) hours.   Bosulif  400 MG tablet Generic drug: bosutinib Take 400 mg by mouth every evening.   levothyroxine  25 MCG tablet Commonly known as: SYNTHROID  Take 25 mcg by mouth daily before breakfast.   metoprolol  succinate 25 MG 24 hr tablet Commonly known as: TOPROL -XL Take 3 tablets (75 mg total) by mouth 2 (two) times daily.   oxyCODONE  5 MG immediate release tablet Commonly known as: Oxy IR/ROXICODONE  Take 1  tablet (5 mg total) by mouth every 8 (eight) hours as needed for severe pain (pain score 7-10) (For pain not relieved with Tylenol , ibuprofen, rest, ice,).   pantoprazole  40 MG tablet Commonly known as: PROTONIX  Take 1 tablet (40 mg total) by mouth daily. Start taking on: October 12, 2024   rosuvastatin  5 MG tablet Commonly known as: CRESTOR  Take 5 mg by mouth at bedtime. What changed: when to take this   spironolactone  25 MG tablet Commonly known as: ALDACTONE  Take 0.5 tablets (12.5 mg total) by mouth daily. Start taking on: October 12, 2024   Torsemide  60 MG Tabs Take 60 mg by mouth daily. Start taking on: October 14, 2024        Day of Discharge BP 116/88 (BP Location: Right Arm)   Pulse 98   Temp 98.4 F (36.9 C) (Oral)   Resp 20   Ht 6' 2 (1.88 m)   Wt 99.7 kg   SpO2 100%   BMI 28.22 kg/m   Physical Exam: General: No acute respiratory distress Lungs: Clear to auscultation bilaterally without wheezes or crackles Cardiovascular: Regular rate and rhythm without murmur gallop or rub normal S1 and S2 Abdomen: Nontender, nondistended, soft, bowel sounds positive, no rebound, no ascites, no appreciable mass Extremities: No significant cyanosis, clubbing, or edema bilateral  lower extremities  Basic Metabolic Panel: Recent Labs  Lab 10/06/24 0805 10/07/24 0336 10/08/24 0633 10/09/24 0544 10/10/24 0423 10/11/24 0531  NA 135 135 135 138 138 137  K 4.0 3.8 3.9 3.9 3.8 4.3  CL 99 99 95* 98 97* 96*  CO2 27 29 32 29 27 31   GLUCOSE 107* 97 90 98 97 89  BUN 25* 24* 24* 26* 25* 30*  CREATININE 1.09 1.28* 1.32* 1.33* 1.26* 1.62*  CALCIUM  8.4* 8.3* 8.3* 8.6* 8.7* 8.9  MG 2.1 2.0 2.0 2.1 2.1  --     CBC: Recent Labs  Lab 10/06/24 0805 10/07/24 0336 10/08/24 0633 10/09/24 0545 10/10/24 1239 10/11/24 0531  WBC 14.2* 12.4* 10.8*  --  12.7* 10.9*  HGB 8.5* 8.7* 8.1* 8.5* 9.3* 9.2*  HCT 25.4* 26.0* 24.4* 25.8* 28.4* 27.9*  MCV 88.2 87.2 89.1  --  91.0 89.7   PLT 410* 407* 359  --  359 373    Time spent in discharge (includes decision making & examination of pt): 35 minutes  10/11/2024, 12:55 PM   Reyes IVAR Moores, MD [facility] Office  [phone]

## 2024-10-11 NOTE — Progress Notes (Signed)
 Orders for outpatient metabolic panel in 1 week

## 2024-10-11 NOTE — Care Management Important Message (Signed)
 Important Message  Patient Details  Name: Lance Hubbard MRN: [id number] Date of Birth: 1941-01-17   Important Message Given:  Yes - Medicare IM     [name] 10/11/2024, 3:16 PM

## 2024-10-11 NOTE — Progress Notes (Signed)
 Progress Note  Patient Name: Lance Hubbard Date of Encounter: 10/11/2024 [facility] Cardiologist: Wilbert Bihari, MD   Interval Summary   Denies any chest pain, shortness of breath, abdominal tightness, or orthopnea.  Vital Signs Vitals:   10/10/24 1601 10/10/24 2040 10/11/24 0427 10/11/24 0500  BP: 104/73 104/71 94/70   Pulse: 96 100 100   Resp: 18 18 20    Temp: 97.8 F (36.6 C) 97.7 F (36.5 C) 97.6 F (36.4 C)   TempSrc: Oral Oral Oral   SpO2: 98% 97% 99%   Weight:    99.7 kg  Height:        Intake/Output Summary (Last 24 hours) at 10/11/2024 0732 Last data filed at 10/11/2024 0429 Gross per 24 hour  Intake 0 ml  Output 3200 ml  Net -3200 ml      10/11/2024    5:00 AM 10/10/2024    8:10 AM 10/09/2024    4:58 AM  Last 3 Weights  Weight (lbs) 219 lb 12.8 oz 224 lb 9.6 oz 227 lb 8 oz  Weight (kg) 99.701 kg 101.878 kg 103.193 kg      Telemetry/ECG  Atrial fibrillation with heart rates in the 90-110's - Personally Reviewed  Physical Exam  GEN: No acute distress.  Alert and oriented on room air Neck: No JVD Cardiac: RRR, no murmurs, rubs, or gallops.  Respiratory: Clear to auscultation bilaterally. GI: Soft, nontender, non-distended  MS: 1+ bilateral lower extremity edema  Assessment & Plan   Patient with past medical history significant for atrial fibrillation status post PVI 05/2021, hypothyroidism, hypertension, CML, hyperlipidemia, tobacco use. Patient admitted for sepsis secondary to biliary pancreatitis status post cholecystectomy. Cardiology following for recurrence of atrial fibrillation and acute HFrEF.   Acute on chronic HFrEF  Pulmonary hypertension Hypoalbuminemia  TTE on 08/2024 showed reduced LVEF of 30 to 35%, normal RV systolic function, elevated RV systolic pressure of 65 mmHg, and mild aortic valve calcification with normal function.  Prior to this echo showed a normal LVEF.  Has a history of heart failure with recovered LVEF.   Suspect heart failure may be secondary to A-fib with RVR and sepsis. Is net out 7.6 L since admission. Labs on 10/07/2024 showed albumin of 2.8.  This is likely contributing to edema.  On 10/08/2024 BNP was elevated at 694.6.  Labs today showed potassium of 3.8, magnesium  of 2.1, and creatinine of 1.26.  Ordered potassium replacement.  Patient reported that his baseline weight was 220 pounds.  His most recent weight was 224.6 pounds.  I's and O's are net out 8.8 L.  Was started on IV Lasix .  On physical exam the patient appears slightly volume up but overall looks fairly euvolemic. Decrease torsemide  to 60 mg daily.  May increase to 80 mg daily as needed for 5 to 10 pound weight gain, shortness of breath, worsening lower extreme edema. Recommend daily weights at home. Recommend limiting sodium intake to less than 2 g at home. Will need basic metabolic panel 1 week after discharge. Needs repeat TTE to reassess LVEF in 2 to 3 months.  May consider ischemic evaluation at that time if remains reduced. Has follow up for Lance Fabry PA-C on 11/11/23. GDMT -has been limited by hypotension.  Hide to admission was on Entresto  but this was held.  Will prioritize rate control given patient also has A-fib with RVR. Continue metoprolol  succinate 75 mg daily. Continue Spironolactone  12.5 mg.     A-fib with RVR CHA2DS2-VASc Score =  4 [CHF History: 1, HTN History: 1, Diabetes History: 0, Stroke History: 0, Vascular Disease History: 0, Age Score: 2, Gender Score: 0].  Therefore, the patient's annual risk of stroke is 4.8 %.    Was initially diagnosed in 2015.  Had an ablation on 05/2021.  Had a cardioversion on 12/2023. Patient is currently a poor candidate for a cardioversion/TEE cardioversion given that there are ongoing concerns for GI bleed.  Ventricular rates on telemetry are in the 90's to 100's Has follow-up appointment with Lance Hubbard on 11/11/2023.  May consider cardioversion at that time if hemoglobin  stabilizes and there are no concerns of GI bleed. Continue Eliquis  5 mg twice daily. Continue amiodarone  200 mg twice daily. Continue metoprolol  succinate 75 mg daily     Anemia Hemoglobin this a.m. was 8.5.  And initially declined after the patient was started on Eliquis . EGD showed no gross lesions in esophagus, 3 cm hiatal hernia, patent sphincterotomy with congestion and ulceration.     CAD Hyperlipidemia Cardiac CT on 04/2021 showed a coronary calcium  score of 419 (55th percentile).  Continue Crestor  5 mg daily.     Pleural effusion Had right thoracentesis on 10/02/24 that removed 1.4 L of fluid.     Otherwise management per primary.   [facility] will sign off.   Medication Recommendations: As above Other recommendations (labs, testing, etc): Follow-up basic metabolic panel in 1 week Follow up as an outpatient: Follow-up with Lance Hubbard 11/11/2023. For questions or updates, please contact  [facility] Please consult [email or web address] for contact info under        Signed, Otha Monical, PA-C

## 2024-10-17 ENCOUNTER — Telehealth: Payer: Self-pay | Admitting: Gastroenterology

## 2024-10-17 ENCOUNTER — Telehealth: Payer: Self-pay | Admitting: Cardiology

## 2024-10-17 NOTE — Telephone Encounter (Signed)
 The pt has ERCP on 2/5 and states that he needs to have cardioversion and will be put on Eliquis  and will need to be in that 3 weeks prior and 4 weeks after cardioversion.  They want to push the ERCP out until March or later.  They would like to know if this is safe.

## 2024-10-17 NOTE — Telephone Encounter (Signed)
 His stent should be functioning fine, so I am okay with this being pushed into March.

## 2024-10-17 NOTE — Telephone Encounter (Signed)
 The pt has been advised that I will be reaching out to him after the March schedule comes out to reschedule.

## 2024-10-17 NOTE — Telephone Encounter (Signed)
 PT wife is calling to discuss other dates for the ERCP. The patient has to have a cardiac conversion done and they just wanna be able to work around that. Please advise.

## 2024-10-17 NOTE — Telephone Encounter (Signed)
 Pt wife called in asking can pt be seen sooner because she wants pt to have cardioversion as soon as possible. Pt has gen card f/u with [place], [place] 11/11/23. She is not sure if he can afib clinic sooner or who to f/u with sooner..   There are no TOC or open slot with gen card APP right now

## 2024-10-17 NOTE — Telephone Encounter (Signed)
 Patient's wife Jenkins is requesting patient be seen sooner than appt scheduled for 11/11/23 to arrange cardioversion ASAP.  Ann states patient does not tolerate being in a-fib well. She states he is tired and weak. Jenkins states she spoke with someone at the A-Fib clinic today and was told she would need to contact Dr. Melba office about pushing ERCP procedure out.  Ann states Dr. Melba office told her it is OK for patient to push ERCP out to March to be able to completed DCCV. Notes from Dr. Melba office in chart.  Our office does not have any appts available sooner than 11/10/24.  Will forward message to A-Fib clinic to see if patient can be seen in their office to discuss/arrange DCCV.

## 2024-10-25 ENCOUNTER — Ambulatory Visit (HOSPITAL_COMMUNITY)
Admission: RE | Admit: 2024-10-25 | Discharge: 2024-10-25 | Disposition: A | Discharge status: Home / Self Care | Source: Ambulatory Visit | Attending: Physician Assistant | Admitting: Physician Assistant

## 2024-10-25 VITALS — BP 112/76 | HR 117 | Ht 74.0 in | Wt 217.4 lb

## 2024-10-25 DIAGNOSIS — D6869 Other thrombophilia: Secondary | ICD-10-CM

## 2024-10-25 DIAGNOSIS — Z5181 Encounter for therapeutic drug level monitoring: Secondary | ICD-10-CM | POA: Diagnosis not present

## 2024-10-25 DIAGNOSIS — I4819 Other persistent atrial fibrillation: Secondary | ICD-10-CM

## 2024-10-25 DIAGNOSIS — I4891 Unspecified atrial fibrillation: Secondary | ICD-10-CM | POA: Diagnosis not present

## 2024-10-25 DIAGNOSIS — Z79899 Other long term (current) drug therapy: Secondary | ICD-10-CM | POA: Diagnosis not present

## 2024-10-25 MED ORDER — AMIODARONE HCL 200 MG PO TABS: 200.0000 mg | ORAL_TABLET | Freq: Every day | ORAL | 0 refills | Status: AC

## 2024-10-25 MED ORDER — METOPROLOL SUCCINATE ER 25 MG PO TB24: 75.0000 mg | ORAL_TABLET | Freq: Every day | ORAL | 0 refills | Status: AC

## 2024-10-25 NOTE — Progress Notes (Signed)
 "   Primary Care Physician: Frederik Charleston, MD Primary Cardiologist: Dr Levern  Primary Electrophysiologist: Former Dr Kelsie patient  Referring Physician: Dr Levern Lance Hubbard is a 83 y.o. male with a history of HTN, systolic dysfunction, hypothyroidism, CML, HLD, tobacco abuse, and atrial fibrillation who presents for follow up in the [facility].  The patient was initially diagnosed with atrial fibrillation in 2015. He underwent DCCV at that time and has been maintained on amiodarone . Patient is on Eliquis  for stroke prevention. He was in his usual state of health until the beginning of May 2022 when he was noted to be back in afib with rapid rates. His amiodarone  was increased and he was started on metoprolol .  Patient is s/p afib ablation 05/29/21 with Dr Kelsie. Amiodarone  and metoprolol  were discontinued. Patient called the clinic 01/13/24 reporting that he had gone back into afib with symptoms of fatigue on exertion. He checked his heart rate which was elevated in the 120s bpm. He tried taking a couple doses of amiodarone  which he was on prior to his ablation. He was started on metoprolol  for rate control and underwent DCCV on 01/18/24.  presented to the ER 12/3 with the acute onset of severe abdominal pain with associated hypotension. He had undergone a laparoscopic cholecystectomy 09/22/2024 and after being discharged home had noted a very poor appetite and persisting abdominal pain. On presentation to the ER 12/3 he was found to be in A-fib with RVR at 130. He underwent ERCP to remove biliary stone. He also had persistent anemia and underwent EGD which showed no signs of acute bleeding, his Eliquis  was resumed 12/13. He was also reloaded on amiodarone .   Patient returns for follow up for atrial fibrillation and amiodarone  monitoring. He remains in afib today with suboptimal rate control. He has symptoms of fatigue. He denies any further abdominal pain or GI  bleeding.    Today, he  denies symptoms of palpitations, chest pain, orthopnea, PND, lower extremity edema, dizziness, presyncope, syncope, snoring, daytime somnolence, bleeding, or neurologic sequela. The patient is tolerating medications without difficulties and is otherwise without complaint today.    Atrial Fibrillation Risk Factors:  he does not have symptoms or diagnosis of sleep apnea. he does not have a history of rheumatic fever. he does not have a history of alcohol use.   Atrial Fibrillation Management history:  Previous antiarrhythmic drugs: amiodarone   Previous cardioversions: 2015, 01/18/24 Previous ablations: 05/29/21 Anticoagulation history: Eliquis    Past Medical History:  Diagnosis Date   Arthritis    CML (chronic myelocytic leukemia) (HCC)    in remission   History of kidney stones    Hypertension    Persistent atrial fibrillation (HCC)    Atrial Fibrillation    Current Outpatient Medications  Medication Sig Dispense Refill   acetaminophen  (TYLENOL ) 325 MG tablet Take 2 tablets (650 mg total) by mouth every 6 (six) hours as needed for mild pain (pain score 1-3) or moderate pain (pain score 4-6). (Patient taking differently: Take 650 mg by mouth as needed for mild pain (pain score 1-3) or moderate pain (pain score 4-6).)     apixaban  (ELIQUIS ) 5 MG TABS tablet Take 1 tablet (5 mg total) by mouth every 12 (twelve) hours. 60 tablet 3   bosutinib (BOSULIF ) 400 MG tablet Take 400 mg by mouth every evening.     levothyroxine  (SYNTHROID ) 25 MCG tablet Take 25 mcg by mouth daily before breakfast.     pantoprazole  (  PROTONIX ) 40 MG tablet Take 1 tablet (40 mg total) by mouth daily. 60 tablet 0   rosuvastatin  (CRESTOR ) 5 MG tablet Take 5 mg by mouth at bedtime. (Patient taking differently: Take 5 mg by mouth every other day.)     spironolactone  (ALDACTONE ) 25 MG tablet Take 0.5 tablets (12.5 mg total) by mouth daily. 30 tablet 0   torsemide  (DEMADEX ) 20 MG tablet Take 3  tablets (60 mg total) by mouth daily. (Patient taking differently: Take 60 mg by mouth as needed.) 180 tablet 0   [START ON 10/30/2024] amiodarone  (PACERONE ) 200 MG tablet Take 1 tablet (200 mg total) by mouth daily. 120 tablet 0   [START ON 10/30/2024] metoprolol  succinate (TOPROL -XL) 25 MG 24 hr tablet Take 3 tablets (75 mg total) by mouth daily. 180 tablet 0   No current facility-administered medications for this encounter.    ROS- All systems are reviewed and negative except as per the HPI above.  Physical Exam: Vitals:   10/25/24 0931  BP: 112/76  Pulse: (!) 117  Weight: 98.6 kg  Height: 6' 2 (1.88 m)     GEN: Well nourished, well developed in no acute distress CARDIAC: Irregularly irregular rate and rhythm, no murmurs, rubs, gallops RESPIRATORY:  Clear to auscultation without rales, wheezing or rhonchi  ABDOMEN: Soft, non-tender, non-distended EXTREMITIES:  No edema; No deformity    Wt Readings from Last 3 Encounters:  10/25/24 98.6 kg  10/11/24 99.7 kg  09/22/24 (P) 99.8 kg    EKG Interpretation Date/Time:  Wednesday October 25 2024 09:45:08 EST Ventricular Rate:  117 PR Interval:    QRS Duration:  116 QT Interval:  378 QTC Calculation: 527 R Axis:   113  Text Interpretation: Atrial fibrillation with rapid ventricular response Right axis deviation Incomplete left bundle branch block Abnormal ECG When compared with ECG of 09-Oct-2024 18:03, No significant change was found Confirmed by Sway Guttierrez ([id number]) on 10/25/2024 11:08:37 AM    Echo 09/21/24 demonstrated   1. Left ventricular ejection fraction, by estimation, is 30 to 35%. The  left ventricle has moderately decreased function. The left ventricle  demonstrates global hypokinesis. Left ventricular diastolic parameters are  indeterminate. Elevated left ventricular end-diastolic pressure.   2. Right ventricular systolic function is normal. The right ventricular  size is normal. There is severely elevated  pulmonary artery systolic  pressure. The estimated right ventricular systolic pressure is 65.1 mmHg.   3. Left atrial size was moderately dilated.   4. The mitral valve is normal in structure. Trivial mitral valve  regurgitation. No evidence of mitral stenosis.   5. The aortic valve is normal in structure. There is mild calcification  of the aortic valve. Aortic valve regurgitation is not visualized. Aortic  valve sclerosis/calcification is present, without any evidence of aortic  stenosis.   6. The inferior vena cava is dilated in size with <50% respiratory  variability, suggesting right atrial pressure of 15 mmHg.     Epic records are reviewed at length today   CHA2DS2-VASc Score = 4  The patient's score is based upon: CHF History: 1 HTN History: 1 Diabetes History: 0 Stroke History: 0 Vascular Disease History: 0 Age Score: 2 Gender Score: 0       ASSESSMENT AND PLAN: Persistent Atrial Fibrillation (ICD10:  I48.19) The patient's CHA2DS2-VASc score is 4, indicating a 4.8% annual risk of stroke.   S/p afib ablation 05/29/21 with Dr Kelsie Patient in persistent afib, suspect this episode started due to other acute medical  issues. Will plan for DCCV after >3 weeks of resuming Eliquis .  Check bmet/cbc today. Continue amiodarone  200 mg BID until DCCV, then decrease to once daily. Continue Toprol  75 mg BID until DCCV then decrease to 75 mg once daily. Continue Eliquis  5 mg BID  Secondary Hypercoagulable State (ICD10:  D68.69) The patient is at significant risk for stroke/thromboembolism based upon his CHA2DS2-VASc Score of 4.  Continue Apixaban  (Eliquis ).   High Risk Medication Monitoring (ICD 10: [id number]) Patient requires ongoing monitoring for anti-arrhythmic medication which has the potential to cause life threatening arrhythmias. Intervals on ECG acceptable for amiodarone  monitoring.   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   Follow up with Orren Fabry and AF clinic as scheduled.    Informed Consent   Shared Decision Making/Informed Consent The risks (stroke, cardiac arrhythmias rarely resulting in the need for a temporary or permanent pacemaker, skin irritation or burns and complications associated with conscious sedation including aspiration, arrhythmia, respiratory failure and death), benefits (restoration of normal sinus rhythm) and alternatives of a direct current cardioversion were explained in detail to Lance Hubbard and he agrees to proceed.       Daril Kicks PA-C Afib Clinic [facility] [address] [place], [place] [address] [phone] 10/25/2024 11:11 AM  "

## 2024-10-25 NOTE — Patient Instructions (Addendum)
 Day of procedure decrease amiodarone  to 200 my ONCE daily and decrease metoprolol  75 mg to Once  daily    Cardioversion scheduled for:  10/30/24 Monday at 9:30 am    - Arrive at the [facility] ([address]  and check in with ADMITTING at 9:30 am    - Do not eat or drink anything after midnight the night prior to your procedure.   - Take all your morning medication (except diabetic medications) with a sip of water prior to arrival.  - Do NOT miss any doses of your blood thinner - if you should miss a dose or take a dose more than 4 hours late -- please notify our office immediately.  - You will not be able to drive home after your procedure. Please ensure you have a responsible adult to drive you home. You will need someone with you for 24 hours post procedure.     - Expect to be in the procedural area approximately 2 hours.   - If you feel as if you go back into normal rhythm prior to scheduled cardioversion, please notify our office immediately.   If your procedure is canceled in the cardioversion suite you will be charged a cancellation fee.

## 2024-10-25 NOTE — H&P (View-Only) (Signed)
 "   Primary Care Physician: Lance Charleston, MD Primary Cardiologist: Lance Hubbard  Primary Electrophysiologist: Lance Hubbard patient  Referring Physician: Dr Hubbard Lance Hubbard Lance Hubbard is a 83 y.o. male with a history of HTN, systolic dysfunction, hypothyroidism, CML, HLD, tobacco abuse, and atrial fibrillation who presents for follow up in the [facility].  The patient was initially diagnosed with atrial fibrillation in 2015. He underwent DCCV at that time and has been maintained on amiodarone . Patient is on Eliquis  for stroke prevention. He was in his usual state of health until the beginning of May 2022 when he was noted to be back in afib with rapid rates. His amiodarone  was increased and he was started on metoprolol .  Patient is s/p afib ablation 05/29/21 with Lance Hubbard. Amiodarone  and metoprolol  were discontinued. Patient called the clinic 01/13/24 reporting that he had gone back into afib with symptoms of fatigue on exertion. He checked his heart rate which was elevated in the 120s bpm. He tried taking a couple doses of amiodarone  which he was on prior to his ablation. He was started on metoprolol  for rate control and underwent DCCV on 01/18/24.  presented to the ER 12/3 with the acute onset of severe abdominal pain with associated hypotension. He had undergone a laparoscopic cholecystectomy 09/22/2024 and after being discharged home had noted a very poor appetite and persisting abdominal pain. On presentation to the ER 12/3 he was found to be in A-fib with RVR at 130. He underwent ERCP to remove biliary stone. He also had persistent anemia and underwent EGD which showed no signs of acute bleeding, his Eliquis  was resumed 12/13. He was also reloaded on amiodarone .   Patient returns for follow up for atrial fibrillation and amiodarone  monitoring. He remains in afib today with suboptimal rate control. He has symptoms of fatigue. He denies any further abdominal pain or GI  bleeding.    Today, he  denies symptoms of palpitations, chest pain, orthopnea, PND, lower extremity edema, dizziness, presyncope, syncope, snoring, daytime somnolence, bleeding, or neurologic sequela. The patient is tolerating medications without difficulties and is otherwise without complaint today.    Atrial Fibrillation Risk Factors:  he does not have symptoms or diagnosis of sleep apnea. he does not have a history of rheumatic fever. he does not have a history of alcohol use.   Atrial Fibrillation Management history:  Previous antiarrhythmic drugs: amiodarone   Previous cardioversions: 2015, 01/18/24 Previous ablations: 05/29/21 Anticoagulation history: Eliquis    Past Medical History:  Diagnosis Date   Arthritis    CML (chronic myelocytic leukemia) (HCC)    in remission   History of kidney stones    Hypertension    Persistent atrial fibrillation (HCC)    Atrial Fibrillation    Current Outpatient Medications  Medication Sig Dispense Refill   acetaminophen  (TYLENOL ) 325 MG tablet Take 2 tablets (650 mg total) by mouth every 6 (six) hours as needed for mild pain (pain score 1-3) or moderate pain (pain score 4-6). (Patient taking differently: Take 650 mg by mouth as needed for mild pain (pain score 1-3) or moderate pain (pain score 4-6).)     apixaban  (ELIQUIS ) 5 MG TABS tablet Take 1 tablet (5 mg total) by mouth every 12 (twelve) hours. 60 tablet 3   bosutinib (BOSULIF ) 400 MG tablet Take 400 mg by mouth every evening.     levothyroxine  (SYNTHROID ) 25 MCG tablet Take 25 mcg by mouth daily before breakfast.     pantoprazole  (  PROTONIX ) 40 MG tablet Take 1 tablet (40 mg total) by mouth daily. 60 tablet 0   rosuvastatin  (CRESTOR ) 5 MG tablet Take 5 mg by mouth at bedtime. (Patient taking differently: Take 5 mg by mouth every other day.)     spironolactone  (ALDACTONE ) 25 MG tablet Take 0.5 tablets (12.5 mg total) by mouth daily. 30 tablet 0   torsemide  (DEMADEX ) 20 MG tablet Take 3  tablets (60 mg total) by mouth daily. (Patient taking differently: Take 60 mg by mouth as needed.) 180 tablet 0   [START ON 10/30/2024] amiodarone  (PACERONE ) 200 MG tablet Take 1 tablet (200 mg total) by mouth daily. 120 tablet 0   [START ON 10/30/2024] metoprolol  succinate (TOPROL -XL) 25 MG 24 hr tablet Take 3 tablets (75 mg total) by mouth daily. 180 tablet 0   No current facility-administered medications for this encounter.    ROS- All systems are reviewed and negative except as per the HPI above.  Physical Exam: Vitals:   10/25/24 0931  BP: 112/76  Pulse: (!) 117  Weight: 98.6 kg  Height: 6' 2 (1.88 m)     GEN: Well nourished, well developed in no acute distress CARDIAC: Irregularly irregular rate and rhythm, no murmurs, rubs, gallops RESPIRATORY:  Clear to auscultation without rales, wheezing or rhonchi  ABDOMEN: Soft, non-tender, non-distended EXTREMITIES:  No edema; No deformity    Wt Readings from Last 3 Encounters:  10/25/24 98.6 kg  10/11/24 99.7 kg  09/22/24 (P) 99.8 kg    EKG Interpretation Date/Time:  Wednesday October 25 2024 09:45:08 EST Ventricular Rate:  117 PR Interval:    QRS Duration:  116 QT Interval:  378 QTC Calculation: 527 R Axis:   113  Text Interpretation: Atrial fibrillation with rapid ventricular response Right axis deviation Incomplete left bundle branch block Abnormal ECG When compared with ECG of 09-Oct-2024 18:03, No significant change was found Confirmed by Sway Guttierrez ([id number]) on 10/25/2024 11:08:37 AM    Echo 09/21/24 demonstrated   1. Left ventricular ejection fraction, by estimation, is 30 to 35%. The  left ventricle has moderately decreased function. The left ventricle  demonstrates global hypokinesis. Left ventricular diastolic parameters are  indeterminate. Elevated left ventricular end-diastolic pressure.   2. Right ventricular systolic function is normal. The right ventricular  size is normal. There is severely elevated  pulmonary artery systolic  pressure. The estimated right ventricular systolic pressure is 65.1 mmHg.   3. Left atrial size was moderately dilated.   4. The mitral valve is normal in structure. Trivial mitral valve  regurgitation. No evidence of mitral stenosis.   5. The aortic valve is normal in structure. There is mild calcification  of the aortic valve. Aortic valve regurgitation is not visualized. Aortic  valve sclerosis/calcification is present, without any evidence of aortic  stenosis.   6. The inferior vena cava is dilated in size with <50% respiratory  variability, suggesting right atrial pressure of 15 mmHg.     Epic records are reviewed at length today   CHA2DS2-VASc Score = 4  The patient's score is based upon: CHF History: 1 HTN History: 1 Diabetes History: 0 Stroke History: 0 Vascular Disease History: 0 Age Score: 2 Gender Score: 0       ASSESSMENT AND PLAN: Persistent Atrial Fibrillation (ICD10:  I48.19) The patient's CHA2DS2-VASc score is 4, indicating a 4.8% annual risk of stroke.   S/p afib ablation 05/29/21 with Lance Hubbard Patient in persistent afib, suspect this episode started due to other acute medical  issues. Will plan for DCCV after >3 weeks of resuming Eliquis .  Check bmet/cbc today. Continue amiodarone  200 mg BID until DCCV, then decrease to once daily. Continue Toprol  75 mg BID until DCCV then decrease to 75 mg once daily. Continue Eliquis  5 mg BID  Secondary Hypercoagulable State (ICD10:  D68.69) The patient is at significant risk for stroke/thromboembolism based upon his CHA2DS2-VASc Score of 4.  Continue Apixaban  (Eliquis ).   High Risk Medication Monitoring (ICD 10: [id number]) Patient requires ongoing monitoring for anti-arrhythmic medication which has the potential to cause life threatening arrhythmias. Intervals on ECG acceptable for amiodarone  monitoring.   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   Follow up with Orren Fabry and AF clinic as scheduled.    Informed Consent   Shared Decision Making/Informed Consent The risks (stroke, cardiac arrhythmias rarely resulting in the need for a temporary or permanent pacemaker, skin irritation or burns and complications associated with conscious sedation including aspiration, arrhythmia, respiratory failure and death), benefits (restoration of normal sinus rhythm) and alternatives of a direct current cardioversion were explained in detail to Mr. Gwyn and he agrees to proceed.       Daril Kicks PA-C Afib Clinic [facility] [address] [place], [place] [address] [phone] 10/25/2024 11:11 AM  "

## 2024-10-26 LAB — BASIC METABOLIC PANEL WITH GFR
BUN/Creatinine Ratio: 18 (ref 10–24)
BUN: 29 mg/dL — ABNORMAL HIGH (ref 8–27)
CO2: 24 mmol/L (ref 20–29)
Calcium: 9.4 mg/dL (ref 8.6–10.2)
Chloride: 98 mmol/L (ref 96–106)
Creatinine, Ser: 1.62 mg/dL — ABNORMAL HIGH (ref 0.76–1.27)
Glucose: 92 mg/dL (ref 70–99)
Potassium: 4.8 mmol/L (ref 3.5–5.2)
Sodium: 139 mmol/L (ref 134–144)
eGFR: 42 mL/min/1.73 — ABNORMAL LOW

## 2024-10-26 LAB — CBC
Hematocrit: 35.7 % — ABNORMAL LOW (ref 37.5–51.0)
Hemoglobin: 11.4 g/dL — ABNORMAL LOW (ref 13.0–17.7)
MCH: 28.7 pg (ref 26.6–33.0)
MCHC: 31.9 g/dL (ref 31.5–35.7)
MCV: 90 fL (ref 79–97)
Platelets: 174 x10E3/uL (ref 150–450)
RBC: 3.97 x10E6/uL — ABNORMAL LOW (ref 4.14–5.80)
RDW: 13.6 % (ref 11.6–15.4)
WBC: 11.1 x10E3/uL — ABNORMAL HIGH (ref 3.4–10.8)

## 2024-10-27 ENCOUNTER — Ambulatory Visit (HOSPITAL_COMMUNITY): Payer: Self-pay | Admitting: Physician Assistant

## 2024-10-27 ENCOUNTER — Ambulatory Visit
Admission: RE | Admit: 2024-10-27 | Discharge: 2024-10-27 | Disposition: A | Discharge status: Home / Self Care | Source: Ambulatory Visit | Attending: Gastroenterology | Admitting: Gastroenterology

## 2024-10-27 DIAGNOSIS — T85528A Displacement of other gastrointestinal prosthetic devices, implants and grafts, initial encounter: Secondary | ICD-10-CM | POA: Diagnosis not present

## 2024-10-27 DIAGNOSIS — K851 Biliary acute pancreatitis without necrosis or infection: Secondary | ICD-10-CM

## 2024-10-27 DIAGNOSIS — K8050 Calculus of bile duct without cholangitis or cholecystitis without obstruction: Secondary | ICD-10-CM

## 2024-10-27 NOTE — Progress Notes (Signed)
 Pt called for pre procedure instructions. Arrival time 1000 NPO after midnight explained Instructed to take am meds with sip of water and confirmed blood thinner consistency. Instructed pt need for ride home tomorrow and have responsible adult with them for 24 hrs post procedure.

## 2024-10-27 NOTE — Telephone Encounter (Signed)
 Patients wife called stating that patient is supposed to have the Caridoversion on Monday and does not need to reschedule the ERCP with Dr. Agatha on 2/5. She states that he is coming to get his x-rays done today and is wanting them to look to make sure he is okay to have the Caridoverison on Monday.  Please advise.

## 2024-10-30 ENCOUNTER — Ambulatory Visit (HOSPITAL_COMMUNITY): Admitting: Anesthesiology

## 2024-10-30 ENCOUNTER — Ambulatory Visit (HOSPITAL_COMMUNITY)
Admission: RE | Admit: 2024-10-30 | Discharge: 2024-10-30 | Disposition: A | Discharge status: Home / Self Care | Attending: Internal Medicine | Admitting: Internal Medicine

## 2024-10-30 ENCOUNTER — Encounter (HOSPITAL_COMMUNITY): Admission: RE | Disposition: A | Payer: Self-pay | Source: Home / Self Care | Attending: Internal Medicine

## 2024-10-30 ENCOUNTER — Encounter (HOSPITAL_COMMUNITY): Payer: Self-pay | Admitting: Internal Medicine

## 2024-10-30 ENCOUNTER — Other Ambulatory Visit: Payer: Self-pay

## 2024-10-30 DIAGNOSIS — D6869 Other thrombophilia: Secondary | ICD-10-CM | POA: Insufficient documentation

## 2024-10-30 DIAGNOSIS — E785 Hyperlipidemia, unspecified: Secondary | ICD-10-CM | POA: Diagnosis not present

## 2024-10-30 DIAGNOSIS — I4819 Other persistent atrial fibrillation: Secondary | ICD-10-CM | POA: Diagnosis present

## 2024-10-30 DIAGNOSIS — Z7901 Long term (current) use of anticoagulants: Secondary | ICD-10-CM | POA: Insufficient documentation

## 2024-10-30 DIAGNOSIS — I110 Hypertensive heart disease with heart failure: Secondary | ICD-10-CM | POA: Insufficient documentation

## 2024-10-30 DIAGNOSIS — E039 Hypothyroidism, unspecified: Secondary | ICD-10-CM | POA: Insufficient documentation

## 2024-10-30 DIAGNOSIS — I10 Essential (primary) hypertension: Secondary | ICD-10-CM

## 2024-10-30 DIAGNOSIS — I5022 Chronic systolic (congestive) heart failure: Secondary | ICD-10-CM | POA: Insufficient documentation

## 2024-10-30 DIAGNOSIS — Z006 Encounter for examination for normal comparison and control in clinical research program: Secondary | ICD-10-CM

## 2024-10-30 DIAGNOSIS — Z79890 Hormone replacement therapy (postmenopausal): Secondary | ICD-10-CM | POA: Diagnosis not present

## 2024-10-30 DIAGNOSIS — I4891 Unspecified atrial fibrillation: Secondary | ICD-10-CM | POA: Diagnosis not present

## 2024-10-30 DIAGNOSIS — Z79899 Other long term (current) drug therapy: Secondary | ICD-10-CM | POA: Insufficient documentation

## 2024-10-30 DIAGNOSIS — Z856 Personal history of leukemia: Secondary | ICD-10-CM | POA: Insufficient documentation

## 2024-10-30 DIAGNOSIS — Z87891 Personal history of nicotine dependence: Secondary | ICD-10-CM | POA: Diagnosis not present

## 2024-10-30 HISTORY — PX: CARDIOVERSION: EP1203

## 2024-10-30 MED ORDER — PROPOFOL 10 MG/ML IV BOLUS
INTRAVENOUS | Status: DC | PRN
  Administered 2024-10-30: 50 mg via INTRAVENOUS

## 2024-10-30 NOTE — CV Procedure (Signed)
" ° ° °  Electrical Cardioversion Procedure Note Lance Hubbard [id number] Jul 25, 1941  Procedure: Electrical Cardioversion Indications:  Atrial Fibrillation  Time Out: Verified patient identification, verified procedure,medications/allergies/relevent history reviewed, required imaging and test results available.  Performed  Procedure Details  The patient was NPO after midnight. Anesthesia was administered at the beside  by Dr.Ellinger's team l.  Cardioversion was done with synchronized biphasic defibrillation with 200 Joules.  The patient converted to sinus bradycardia. The patient tolerated the procedure well.  IMPRESSION:  Successful cardioversion of atrial fibrillation.   Lance Leavens, MD FASE [facility] Cardiologist [facility]  [address] [place], [address] [place], [place] [address] [phone]  11:11 AM     "

## 2024-10-30 NOTE — Telephone Encounter (Signed)
 No answer -no voice mail set up.

## 2024-10-30 NOTE — Anesthesia Postprocedure Evaluation (Signed)
"   Anesthesia Post Note  Patient: Lance Hubbard  Procedure(s) Performed: CARDIOVERSION     Patient location during evaluation: Cath Lab Anesthesia Type: MAC Level of consciousness: awake Pain management: pain level controlled Vital Signs Assessment: post-procedure vital signs reviewed and stable Respiratory status: spontaneous breathing, nonlabored ventilation and respiratory function stable Cardiovascular status: blood pressure returned to baseline and stable Postop Assessment: no apparent nausea or vomiting Anesthetic complications: no   There were no known notable events for this encounter.  Last Vitals:  Vitals:   10/30/24 1140 10/30/24 1145  BP: 110/75   Pulse: (!) 51 (!) 52  Resp: 20 20  Temp:    SpO2: 99% 98%    Last Pain:  Vitals:   10/30/24 1001  TempSrc: Temporal                 Jacquese Hackman P Luis Sami      "

## 2024-10-30 NOTE — Interval H&P Note (Signed)
 History and Physical Interval Note:  10/30/2024 10:18 AM  Lance Hubbard  has presented today for surgery, with the diagnosis of AFIB.  The various methods of treatment have been discussed with the patient and family. After consideration of risks, benefits and other options for treatment, the patient has consented to  Procedures: CARDIOVERSION (N/A) as a surgical intervention.  The patient's history has been reviewed, patient examined, no change in status, stable for surgery.  I have reviewed the patient's chart and labs.  Questions were answered to the patient's satisfaction.     Media Pizzini A Atharva Mirsky

## 2024-10-30 NOTE — Anesthesia Preprocedure Evaluation (Addendum)
"                                    Anesthesia Evaluation  Patient identified by MRN, date of birth, ID band Patient awake    Reviewed: Allergy & Precautions, NPO status , Patient's Chart, lab work & pertinent test results  Airway Mallampati: II       Dental no notable dental hx.    Pulmonary former smoker   Pulmonary exam normal        Cardiovascular hypertension, Pt. on home beta blockers Normal cardiovascular exam+ dysrhythmias Atrial Fibrillation      Neuro/Psych    GI/Hepatic   Endo/Other  Hypothyroidism    Renal/GU Renal InsufficiencyRenal disease     Musculoskeletal   Abdominal   Peds  Hematology  (+) Blood dyscrasia (Eliquis ), anemia   Anesthesia Other Findings A-FIB  Reproductive/Obstetrics                              Anesthesia Physical Anesthesia Plan  ASA: 3  Anesthesia Plan: MAC   Post-op Pain Management:    Induction:   PONV Risk Score and Plan: 1 and Propofol  infusion and Treatment may vary due to age or medical condition  Airway Management Planned: Nasal Cannula  Additional Equipment:   Intra-op Plan:   Post-operative Plan:   Informed Consent: I have reviewed the patients History and Physical, chart, labs and discussed the procedure including the risks, benefits and alternatives for the proposed anesthesia with the patient or authorized representative who has indicated his/her understanding and acceptance.     Dental advisory given  Plan Discussed with: CRNA  Anesthesia Plan Comments:          Anesthesia Quick Evaluation  "

## 2024-10-30 NOTE — Research (Signed)
 [facility] Cardioversion Informed Consent   Subject Name: Lance Hubbard  Subject met inclusion and exclusion criteria.  The informed consent form, study requirements and expectations were reviewed with the subject and questions and concerns were addressed prior to the signing of the consent form.  The subject verbalized understanding of the trial requirements.  The subject agreed to participate in the [facility] Cardioversion trial and signed the informed consent at 1017 on 05/Jan/2026.  The informed consent was obtained prior to performance of any protocol-specific procedures for the subject.  A copy of the signed informed consent was given to the subject and a copy was placed in the subject's medical record.   Lance Hubbard

## 2024-10-30 NOTE — Transfer of Care (Signed)
 Immediate Anesthesia Transfer of Care Note  Patient: Lance Hubbard  Procedure(s) Performed: CARDIOVERSION  Patient Location: Cath Lab  Anesthesia Type:General  Level of Consciousness: awake, alert , and oriented  Airway & Oxygen Therapy: Patient Spontanous Breathing and Patient connected to nasal cannula oxygen  Post-op Assessment: Report given to RN and Post -op Vital signs reviewed and stable  Post vital signs: Reviewed and stable  Last Vitals:  Vitals Value Taken Time  BP    Temp    Pulse    Resp    SpO2      Last Pain:  Vitals:   10/30/24 1001  TempSrc: Temporal         Complications: There were no known notable events for this encounter.

## 2024-10-31 NOTE — Telephone Encounter (Signed)
 The pt states he had cardioversion earlier than expected and will be able to keep the 2/5 procedure as planned.  He will be able to hold Eliquis  2 days prior.

## 2024-11-06 ENCOUNTER — Ambulatory Visit: Payer: Self-pay | Admitting: Gastroenterology

## 2024-11-09 NOTE — Progress Notes (Signed)
 " Cardiology Office Note   Date:  11/10/2024  ID:  Lance Hubbard, DOB [birth year], MRN [id number] PCP: Lance Charleston, MD  [facility] Providers Cardiologist:  Lance Bihari, MD Electrophysiologist:  Lance Gladis Norton, MD   History of Present Illness Lance Hubbard is a 84 y.o. male with a past medical history of hypertension, hypothyroidism, CML, hyperlipidemia, tobacco abuse, atrial fibrillation who is here for follow-up appointment.  He was seen by electrophysiology back in September of last year was diagnosed atrial fibrillation in 2015.  Post ablation 05/29/2021.  Initially on any amiodarone  and metoprolol  but these have been stopped.  Went back in atrial fibrillation March 2025 presents with fatigue.  Cardioversion 01/18/2024.  When he was last seen in the office he was doing well without any further episodes of atrial fibrillation since his cardioversion.  He was in a whirlpool when he went into atrial fibrillation and thinks this may have been a trigger.  He is have peripheral neuropathy and has been told that walking in a pool may help.  He feels that overall pulmonary give him some benefit.  He denies any chest pain or palpitations.  No dyspnea, PND, orthopnea, nausea, vomiting, dizziness, syncope, edema, weight gain, early satiety.  He was recently in the hospital for cardioversion on 10/30/2024.  Today, he presents with a hx of heart failure and atrial fibrillation for follow-up after recent cardioversion.  He is here for follow-up after recent cardioversion. He has not felt like he has been in atrial fibrillation since the procedure. He has not had an EKG today. During his hospital stay his heart rate was 49 after cardioversion and later increased to 63.  His heart pump function is 30 to 35 percent. He is on furosemide , spironolactone , metoprolol , and torsemide  60 mg daily. Entresto  was stopped during his hospital stay because of low systolic blood pressure around 108. His  current weight is 218 pounds. He monitors his weight, with dry weight estimated at 215 to 220 pounds, and has had significant fluid-related weight fluctuations around recent hospitalizations.  He has spinal stenosis and prior back surgery. He walks and uses a recumbent bike but has fatigue after exercise. He has neuropathy related to his cancer medication.  He understands that he has heart failure but was unsure why and what that meant.  Reports no shortness of breath nor dyspnea on exertion. Reports no chest pain, pressure, or tightness. No edema, orthopnea, PND. Reports no palpitations.   Discussed the use of AI scribe software for clinical note transcription with the patient, who gave verbal consent to proceed.   ROS: Pertinent ROS in HPI  Studies Reviewed     Echo 09/21/24  IMPRESSIONS     1. Left ventricular ejection fraction, by estimation, is 30 to 35%. The  left ventricle has moderately decreased function. The left ventricle  demonstrates global hypokinesis. Left ventricular diastolic parameters are  indeterminate. Elevated left  ventricular end-diastolic pressure.   2. Right ventricular systolic function is normal. The right ventricular  size is normal. There is severely elevated pulmonary artery systolic  pressure. The estimated right ventricular systolic pressure is 65.1 mmHg.   3. Left atrial size was moderately dilated.   4. The mitral valve is normal in structure. Trivial mitral valve  regurgitation. No evidence of mitral stenosis.   5. The aortic valve is normal in structure. There is mild calcification  of the aortic valve. Aortic valve regurgitation is not visualized. Aortic  valve sclerosis/calcification is present,  without any evidence of aortic  stenosis.   6. The inferior vena cava is dilated in size with <50% respiratory  variability, suggesting right atrial pressure of 15 mmHg.   Risk Assessment/Calculations  CHA2DS2-VASc Score = 4   This indicates a  4.8% annual risk of stroke. The patient's score is based upon: CHF History: 1 HTN History: 1 Diabetes History: 0 Stroke History: 0 Vascular Disease History: 0 Age Score: 2 Gender Score: 0          Physical Exam VS:  BP 138/66   Pulse 63   Ht 6' 2 (1.88 m)   Wt 218 lb (98.9 kg)   SpO2 98%   BMI 27.99 kg/m        Wt Readings from Last 3 Encounters:  11/10/24 218 lb (98.9 kg)  10/30/24 215 lb (97.5 kg)  10/25/24 217 lb 6.4 oz (98.6 kg)    GEN: Well nourished, well developed in no acute distress NECK: No JVD; No carotid bruits CARDIAC: IRIR, no murmurs, rubs, gallops RESPIRATORY:  Clear to auscultation without rales, wheezing or rhonchi  ABDOMEN: Soft, non-tender, non-distended EXTREMITIES:  No edema; No deformity   ASSESSMENT AND PLAN  Atrial fibrillation status post cardioversion Status post successful cardioversion with heart rate improved to 63 bpm. No AFib symptoms reported. EKG performed to assess rhythm. - EKG showed sinus bradycardia with PACs - Continue follow-up with AFib clinic on March 16th at 10:00 AM.  Heart failure with reduced ejection fraction Ejection fraction at 35%. Torsemide  reduced to 40 mg since baseline weight achieved. Entresto  reinitiated at low dose. Echocardiogram planned in 3 months. Discussed cardiac rehab, but he prefers current exercise regimen. - Reduced torsemide  to 40 mg daily. - Reinitiated Entresto  at a low dose. - Lance recheck echocardiogram in 3 months. - Monitor weight and report any significant changes.  Essential hypertension Hypertension management complicated by previous Entresto  discontinuation due to hypotension. Entresto  reintroduced at low dose for blood pressure management. Home blood pressure monitoring advised. - Reinitiated Entresto  at a low dose. - Monitor blood pressure at home.      Dispo: He can follow-up with the Afib clinic in March and with general cardiology in 6 months  Signed, Orren LOISE Fabry, PA-C   "

## 2024-11-10 ENCOUNTER — Encounter: Payer: Self-pay | Admitting: Physician Assistant

## 2024-11-10 ENCOUNTER — Ambulatory Visit: Attending: Physician Assistant | Admitting: Physician Assistant

## 2024-11-10 VITALS — BP 138/66 | HR 63 | Ht 74.0 in | Wt 218.0 lb

## 2024-11-10 DIAGNOSIS — I4891 Unspecified atrial fibrillation: Secondary | ICD-10-CM | POA: Diagnosis not present

## 2024-11-10 DIAGNOSIS — D6869 Other thrombophilia: Secondary | ICD-10-CM

## 2024-11-10 DIAGNOSIS — I10 Essential (primary) hypertension: Secondary | ICD-10-CM

## 2024-11-10 DIAGNOSIS — I4819 Other persistent atrial fibrillation: Secondary | ICD-10-CM

## 2024-11-10 MED ORDER — SACUBITRIL-VALSARTAN 24-26 MG PO TABS: 1.0000 | ORAL_TABLET | Freq: Two times a day (BID) | ORAL | 6 refills | Status: AC

## 2024-11-10 MED ORDER — TORSEMIDE 40 MG PO TABS: 40.0000 mg | ORAL_TABLET | Freq: Every day | ORAL | 3 refills | Status: AC

## 2024-11-10 NOTE — Patient Instructions (Addendum)
 Medication Instructions:  START Entresto  (sacubitril -valsartan ) 24/26mg  Take 1 tablet twice a day DECREASE Torsemide  (demadex ) to 40mg  Take 1 tablet once a day *If you need a refill on your cardiac medications before your next appointment, please call your pharmacy*  Lab Work: TODAY-BMET If you have labs (blood work) drawn today and your tests are completely normal, you will receive your results only by: MyChart Message (if you have MyChart) OR A paper copy in the mail If you have any lab test that is abnormal or we need to change your treatment, we will call you to review the results.  Testing/Procedures: REPEAT IN 3 MONTHS  nasal stuffinessYour physician has requested that you have an echocardiogram. Echocardiography is a painless test that uses sound waves to create images of your heart. It provides your doctor with information about the size and shape of your heart and how well your hearts chambers and valves are working. This procedure takes approximately one hour. There are no restrictions for this procedure. Please do NOT wear cologne, perfume, aftershave, or lotions (deodorant is allowed). Please arrive 15 minutes prior to your appointment time.  Please note: We ask at that you not bring children with you during ultrasound (echo/ vascular) testing. Due to room size and safety concerns, children are not allowed in the ultrasound rooms during exams. Our front office staff cannot provide observation of children in our lobby area while testing is being conducted. An adult accompanying a patient to their appointment will only be allowed in the ultrasound room at the discretion of the ultrasound technician under special circumstances. We apologize for any inconvenience.  Follow-Up: At [facility], you and your health needs are our priority.  As part of our continuing mission to provide you with exceptional heart care, our providers are all part of one team.  This team includes your  primary Cardiologist (physician) and Advanced Practice Providers or APPs (Physician Assistants and Nurse Practitioners) who all work together to provide you with the care you need, when you need it.  Your next appointment:   6 month(s)  Provider:   Lonni Nanas, MD  We recommend signing up for the patient portal called MyChart.  Sign up information is provided on this After Visit Summary.  MyChart is used to connect with patients for Virtual Visits (Telemedicine).  Patients are able to view lab/test results, encounter notes, upcoming appointments, etc.  Non-urgent messages can be sent to your provider as well.   To learn more about what you can do with MyChart, go to [email or web address].   Other Instructions Please check your blood pressure daily for two weeks, then contact the office with your readings  Please contact the office with your readings either by phone, by dropping it off in person, or by sending it through MyChart.   Be sure to check your blood pressure one to two hours after taking your medications.  Avoid the following for 30 minutes before checking your blood pressure: No caffeine No alcohol No eating No smoking  No exercise  Five minutes before checking your blood pressure: Use the restroom Sit up straight in a chair with your back supported and feet flat on the floor Remain quiet and do not talk

## 2024-11-11 LAB — BASIC METABOLIC PANEL WITH GFR
BUN/Creatinine Ratio: 14 (ref 10–24)
BUN: 22 mg/dL (ref 8–27)
CO2: 24 mmol/L (ref 20–29)
Calcium: 9.1 mg/dL (ref 8.6–10.2)
Chloride: 98 mmol/L (ref 96–106)
Creatinine, Ser: 1.54 mg/dL — ABNORMAL HIGH (ref 0.76–1.27)
Glucose: 93 mg/dL (ref 70–99)
Potassium: 4.3 mmol/L (ref 3.5–5.2)
Sodium: 138 mmol/L (ref 134–144)
eGFR: 45 mL/min/1.73 — ABNORMAL LOW

## 2024-11-13 ENCOUNTER — Ambulatory Visit: Payer: Self-pay | Admitting: Physician Assistant

## 2024-11-15 NOTE — Telephone Encounter (Signed)
" °  PATIENT NAME: Curren Mohrmann MRN: [id number] Caller if other than patient/Relationship: Ann/Spouse Are they on HIPAA? Yes Phone number where they can be reached: [phone] ok to leave a VM  PROVIDER USUALLY SEEN: Camellia Custard Reason for the call: Patient was seen recently and Dr. Custard who advised that patient should get MRI but was told if he wanted to get this done to call back in. Patient has decided to get MRI done. Spouse stated that patient has been experiencing back pain and has been getting worse within last month. Please call back to advise if anything else needed.                  "

## 2024-11-16 ENCOUNTER — Encounter (HOSPITAL_COMMUNITY): Payer: Self-pay | Admitting: Gastroenterology

## 2024-11-22 ENCOUNTER — Telehealth: Payer: Self-pay | Admitting: Gastroenterology

## 2024-11-22 NOTE — Telephone Encounter (Addendum)
 Procedure:ERCP Procedure date: 11/30/24 Procedure location: [facility] Arrival Time: 6:30 am Spoke with the patient Y/N: Yes Any prep concerns? No   Has the patient obtained the prep from the pharmacy ? No prep needed Do you have a care partner and transportation: Yes Any additional concerns? No

## 2024-11-30 ENCOUNTER — Encounter (HOSPITAL_COMMUNITY): Payer: Self-pay | Admitting: Gastroenterology

## 2024-11-30 ENCOUNTER — Encounter (HOSPITAL_COMMUNITY): Payer: Self-pay | Admitting: Anesthesiology

## 2024-11-30 ENCOUNTER — Telehealth: Payer: Self-pay

## 2024-11-30 ENCOUNTER — Other Ambulatory Visit: Payer: Self-pay

## 2024-11-30 ENCOUNTER — Ambulatory Visit (HOSPITAL_COMMUNITY)
Admission: RE | Admit: 2024-11-30 | Discharge: 2024-11-30 | Disposition: A | Discharge status: Home / Self Care | Source: Ambulatory Visit | Attending: Gastroenterology | Admitting: Gastroenterology

## 2024-11-30 ENCOUNTER — Ambulatory Visit (HOSPITAL_COMMUNITY)

## 2024-11-30 ENCOUNTER — Encounter (HOSPITAL_COMMUNITY)
Admission: RE | Disposition: A | Discharge status: Home / Self Care | Payer: Self-pay | Source: Ambulatory Visit | Attending: Gastroenterology

## 2024-11-30 DIAGNOSIS — K838 Other specified diseases of biliary tract: Secondary | ICD-10-CM | POA: Diagnosis not present

## 2024-11-30 DIAGNOSIS — T182XXA Foreign body in stomach, initial encounter: Secondary | ICD-10-CM

## 2024-11-30 DIAGNOSIS — Z4659 Encounter for fitting and adjustment of other gastrointestinal appliance and device: Secondary | ICD-10-CM | POA: Diagnosis not present

## 2024-11-30 DIAGNOSIS — Z9049 Acquired absence of other specified parts of digestive tract: Secondary | ICD-10-CM | POA: Diagnosis not present

## 2024-11-30 DIAGNOSIS — Z9689 Presence of other specified functional implants: Secondary | ICD-10-CM

## 2024-11-30 MED ORDER — CIPROFLOXACIN IN D5W 400 MG/200ML IV SOLN
INTRAVENOUS | Status: AC
  Filled 2024-11-30: qty 200

## 2024-11-30 MED ORDER — FENTANYL CITRATE (PF) 100 MCG/2ML IJ SOLN
INTRAMUSCULAR | Status: AC
  Filled 2024-11-30: qty 2

## 2024-11-30 MED ORDER — GLUCAGON HCL RDNA (DIAGNOSTIC) 1 MG IJ SOLR
INTRAMUSCULAR | Status: AC
  Filled 2024-11-30: qty 2

## 2024-11-30 MED ORDER — SUGAMMADEX SODIUM 200 MG/2ML IV SOLN
INTRAVENOUS | Status: DC | PRN
  Administered 2024-11-30: 400 mg via INTRAVENOUS

## 2024-11-30 MED ORDER — DICLOFENAC SUPPOSITORY 100 MG
RECTAL | Status: DC | PRN
  Administered 2024-11-30: 100 mg via RECTAL

## 2024-11-30 MED ORDER — DICLOFENAC SUPPOSITORY 100 MG
RECTAL | Status: AC
  Filled 2024-11-30: qty 1

## 2024-11-30 MED ORDER — ROCURONIUM BROMIDE 10 MG/ML (PF) SYRINGE
PREFILLED_SYRINGE | INTRAVENOUS | Status: DC | PRN
  Administered 2024-11-30: 70 mg via INTRAVENOUS

## 2024-11-30 MED ORDER — PROPOFOL 10 MG/ML IV BOLUS
INTRAVENOUS | Status: AC
  Filled 2024-11-30: qty 20

## 2024-11-30 MED ORDER — SODIUM CHLORIDE 0.9 % IV SOLN
INTRAVENOUS | Status: DC | PRN
  Administered 2024-11-30: 15 mL

## 2024-11-30 MED ORDER — EPHEDRINE SULFATE-NACL 50-0.9 MG/10ML-% IV SOSY
PREFILLED_SYRINGE | INTRAVENOUS | Status: DC | PRN
  Administered 2024-11-30: 10 mg via INTRAVENOUS
  Administered 2024-11-30: 5 mg via INTRAVENOUS

## 2024-11-30 MED ORDER — VASOPRESSIN 20 UNIT/ML IV SOLN
INTRAVENOUS | Status: AC
  Filled 2024-11-30: qty 1

## 2024-11-30 MED ORDER — PANTOPRAZOLE SODIUM 40 MG PO TBEC: 40.0000 mg | DELAYED_RELEASE_TABLET | Freq: Every day | ORAL | 2 refills | Status: AC

## 2024-11-30 MED ORDER — GLYCOPYRROLATE 0.2 MG/ML IJ SOLN
INTRAMUSCULAR | Status: DC | PRN
  Administered 2024-11-30: .2 mg via INTRAVENOUS

## 2024-11-30 MED ORDER — DEXAMETHASONE SOD PHOSPHATE PF 10 MG/ML IJ SOLN
INTRAMUSCULAR | Status: DC | PRN
  Administered 2024-11-30: 8 mg via INTRAVENOUS

## 2024-11-30 MED ORDER — ONDANSETRON HCL 4 MG/2ML IJ SOLN
INTRAMUSCULAR | Status: DC | PRN
  Administered 2024-11-30: 4 mg via INTRAVENOUS

## 2024-11-30 MED ORDER — FENTANYL CITRATE (PF) 100 MCG/2ML IJ SOLN
INTRAMUSCULAR | Status: DC | PRN
  Administered 2024-11-30 (×2): 50 ug via INTRAVENOUS

## 2024-11-30 MED ORDER — PROPOFOL 10 MG/ML IV BOLUS
INTRAVENOUS | Status: DC | PRN
  Administered 2024-11-30: 80 mg via INTRAVENOUS

## 2024-11-30 MED ORDER — CIPROFLOXACIN IN D5W 400 MG/200ML IV SOLN
INTRAVENOUS | Status: DC | PRN
  Administered 2024-11-30: 400 mg via INTRAVENOUS

## 2024-11-30 NOTE — Telephone Encounter (Signed)
-----   Message from Aloha Finner, MD sent at 11/30/2024  9:40 AM EST ----- Team, Repeat ERCP completed. No evidence of biliary leak persisting. My team will work on scheduling a follow-up in clinic in the coming months to just ensure all is well (thank you Kristene Liberati in advance). Thanks. GM

## 2024-11-30 NOTE — Telephone Encounter (Signed)
 Appt made for 12/22/24 at 230 pm with Delon Failing PA  Letter mailed and sent to My Chart with appt information

## 2024-11-30 NOTE — Transfer of Care (Signed)
 Immediate Anesthesia Transfer of Care Note  Patient: Lance Hubbard  Procedure(s) Performed: ERCP, WITH INTERVENTION IF INDICATED ERCP, WITH STENT REMOVAL ERCP, WITH COMMON BILE DUCT CALCULUS LITHROTRIPSY OR REMOVAL USING BALLOON  Patient Location: PACU and Endoscopy Unit  Anesthesia Type:General  Level of Consciousness: awake, alert , and oriented  Airway & Oxygen Therapy: Patient Spontanous Breathing and Patient connected to face mask oxygen  Post-op Assessment: Report given to RN and Post -op Vital signs reviewed and stable  Post vital signs: Reviewed and stable  Last Vitals:  Vitals Value Taken Time  BP 144/58 11/30/24 09:22  Temp 36.6 C 11/30/24 09:22  Pulse 53 11/30/24 09:26  Resp 13 11/30/24 09:26  SpO2 99 % 11/30/24 09:26  Vitals shown include unfiled device data.  Last Pain:  Vitals:   11/30/24 0922  TempSrc: Temporal  PainSc: 0-No pain         Complications: No notable events documented.

## 2024-11-30 NOTE — H&P (Signed)
 "  GASTROENTEROLOGY PROCEDURE H&P NOTE   Primary Care Physician: Frederik Charleston, MD  HPI: Lance Hubbard is a 84 y.o. male who presents for ERCP for biliary leak and stent removal and cleanout.  Past Medical History:  Diagnosis Date   Arthritis    CML (chronic myelocytic leukemia) (HCC)    in remission   History of kidney stones    Hypertension    Persistent atrial fibrillation (HCC)    Atrial Fibrillation   Past Surgical History:  Procedure Laterality Date   ATRIAL FIBRILLATION ABLATION N/A 05/29/2021   Procedure: ATRIAL FIBRILLATION ABLATION;  Surgeon: Kelsie Agent, MD;  Location: [facility] INVASIVE CV LAB;  Service: Cardiovascular;  Laterality: N/A;   CARDIOVERSION N/A 12/18/2013   Procedure: CARDIOVERSION;  Surgeon: Salena GORMAN Negri, MD;  Location: [facility] ENDOSCOPY;  Service: Cardiovascular;  Laterality: N/A;  9:58 Lido 40 mg, IV Propofol  40 mg, IV for anesthesia,  synched cardioversio @ 120 joules with successful NSR    CARDIOVERSION N/A 01/18/2024   Procedure: CARDIOVERSION;  Surgeon: Jeffrie Oneil BROCKS, MD;  Location: [facility] INVASIVE CV LAB;  Service: Cardiovascular;  Laterality: N/A;   CARDIOVERSION N/A 10/30/2024   Procedure: CARDIOVERSION;  Surgeon: Santo Stanly LABOR, MD;  Location: [facility] INVASIVE CV LAB;  Service: Cardiovascular;  Laterality: N/A;   CHOLECYSTECTOMY N/A 09/22/2024   Procedure: LAPAROSCOPIC CHOLECYSTECTOMY;  Surgeon: Polly Cordella LABOR, MD;  Location: [facility] OR;  Service: General;  Laterality: N/A;   COLONOSCOPY     ERCP N/A 09/29/2024   Procedure: ERCP, WITH INTERVENTION IF INDICATED;  Surgeon: Wilhelmenia Aloha Raddle., MD;  Location: [facility] ENDOSCOPY;  Service: Gastroenterology;  Laterality: N/A;   ESOPHAGOGASTRODUODENOSCOPY N/A 10/04/2024   Procedure: EGD (ESOPHAGOGASTRODUODENOSCOPY);  Surgeon: Wilhelmenia Aloha Raddle., MD;  Location: [facility] ENDOSCOPY;  Service: Gastroenterology;  Laterality: N/A;   FOREIGN BODY REMOVAL  10/04/2024   Procedure: REMOVAL, FOREIGN BODY;  Surgeon:  Wilhelmenia Aloha Raddle., MD;  Location: [facility] ENDOSCOPY;  Service: Gastroenterology;;  pancreas stent   IR THORACENTESIS RIGHT ASP PLEURAL SPACE W/IMG GUIDE  10/02/2024   KNEE ARTHROSCOPY     knee dsurgery     LEFT HEART CATHETERIZATION WITH CORONARY ANGIOGRAM N/A 12/19/2013   Procedure: LEFT HEART CATHETERIZATION WITH CORONARY ANGIOGRAM;  Surgeon: Rober LOISE Chroman, MD;  Location: [facility] CATH LAB;  Service: Cardiovascular;  Laterality: N/A;   LUMBAR LAMINECTOMY/DECOMPRESSION MICRODISCECTOMY N/A 05/17/2019   Procedure: Lumbar Four Lumbar Five Lumbar Laminectomy;  Surgeon: Alix Charleston, MD;  Location: [facility] OR;  Service: Neurosurgery;  Laterality: N/A;  Lumbar 4 Lumbar 5 Lumbar Laminectomy   TEE WITHOUT CARDIOVERSION N/A 12/18/2013   Procedure: TRANSESOPHAGEAL ECHOCARDIOGRAM (TEE);  Surgeon: Salena GORMAN Negri, MD;  Location: [facility] ENDOSCOPY;  Service: Cardiovascular;  Laterality: N/A;   No current facility-administered medications for this encounter.   Current Medications[1] Allergies[2] Family History  Problem Relation Age of Onset   Heart disease Father    Hypertension Sister    Social History   Socioeconomic History   Marital status: Married    Spouse name: Not on file   Number of children: Not on file   Years of education: Not on file   Highest education level: Not on file  Occupational History   Not on file  Tobacco Use   Smoking status: Former    Types: Pipe    Quit date: 11/08/2018    Years since quitting: 6.0   Smokeless tobacco: Never   Tobacco comments:    Former smoker 06/26/2021  Vaping Use   Vaping status: Never Used  Substance and Sexual Activity   Alcohol use: Not Currently    Comment: stopped around 2012   Drug use: No   Sexual activity: Not on file  Other Topics Concern   Not on file  Social History Narrative   Lives in [place] with spouse.   Retired art gallery manager in [employer]   Social Drivers of Health   Tobacco Use: Medium Risk (11/30/2024)   Patient History     Smoking Tobacco Use: Former    Smokeless Tobacco Use: Never    Passive Exposure: Not on Actuary Strain: Not on file  Food Insecurity: No Food Insecurity (09/28/2024)   Epic    Worried About Programme Researcher, Broadcasting/film/video in the Last Year: Never true    Ran Out of Food in the Last Year: Never true  Transportation Needs: No Transportation Needs (09/28/2024)   Epic    Lack of Transportation (Medical): No    Lack of Transportation (Non-Medical): No  Physical Activity: Not on file  Stress: Not on file  Social Connections: Socially Integrated (09/28/2024)   Social Connection and Isolation Panel    Frequency of Communication with Friends and Family: More than three times a week    Frequency of Social Gatherings with Friends and Family: Twice a week    Attends Religious Services: More than 4 times per year    Active Member of [employer] or Organizations: Yes    Attends Banker Meetings: More than 4 times per year    Marital Status: Married  Catering Manager Violence: Not At Risk (09/28/2024)   Epic    Fear of Current or Ex-Partner: No    Emotionally Abused: No    Physically Abused: No    Sexually Abused: No  Depression (PHQ2-9): Not on file  Alcohol Screen: Not on file  Housing: Unknown (10/17/2024)   Received from [facility]   Epic    Unable to Pay for Housing in the Last Year: Not on file    Number of Times Moved in the Last Year: Not on file    At any time in the past 12 months, were you homeless or living in a shelter (including now)?: No  Utilities: Not At Risk (09/28/2024)   Epic    Threatened with loss of utilities: No  Health Literacy: Not on file    Physical Exam: Today's Vitals   11/30/24 0704  BP: 136/64  Pulse: (!) 58  Resp: 13  Temp: 97.8 F (36.6 C)  TempSrc: Temporal  SpO2: 100%  Weight: 95.7 kg  Height: 6' 2 (1.88 m)  PainSc: 0-No pain   Body mass index is 27.09 kg/m. GEN: NAD EYE: Sclerae anicteric ENT: MMM CV:  Non-tachycardic GI: Soft, NT/ND NEURO:  Alert & Oriented x 3  Lab Results: No results for input(s): WBC, HGB, HCT, PLT in the last 72 hours. BMET No results for input(s): NA, K, CL, CO2, GLUCOSE, BUN, CREATININE, CALCIUM  in the last 72 hours. LFT No results for input(s): PROT, ALBUMIN, AST, ALT, ALKPHOS, BILITOT, BILIDIR, IBILI in the last 72 hours. PT/INR No results for input(s): LABPROT, INR in the last 72 hours.   Impression / Plan: This is a 84 y.o.male who presents for ERCP for biliary leak and stent removal and cleanout.  The risks of an ERCP were discussed at length, including but not limited to the risk of perforation, bleeding, abdominal pain, post-ERCP pancreatitis (while usually mild can be severe and even life threatening).  The risks and benefits of endoscopic evaluation/treatment were discussed with the patient and/or family; these include but are not limited to the risk of perforation, infection, bleeding, missed lesions, lack of diagnosis, severe illness requiring hospitalization, as well as anesthesia and sedation related illnesses.  The patient's history has been reviewed, patient examined, no change in status, and deemed stable for procedure.  The patient and/or family was provided an opportunity to ask questions and all were answered.  The patient and/or family is agreeable to proceed.    Aloha Finner, MD [facility] Advanced [facility]oscopy Office # [phone]     [1] No current facility-administered medications for this encounter. [2]  Allergies Allergen Reactions   Amoxicillin Hives and Other (See Comments)    REACTION: whelps/pain in knees and ankles   Penicillin G     Other reaction(s): hives   "

## 2024-11-30 NOTE — Anesthesia Postprocedure Evaluation (Signed)
"   Anesthesia Post Note  Patient: XAVIAR LUNN  Procedure(s) Performed: ERCP, WITH INTERVENTION IF INDICATED ERCP, WITH STENT REMOVAL ERCP, WITH COMMON BILE DUCT CALCULUS LITHROTRIPSY OR REMOVAL USING BALLOON     Patient location during evaluation: PACU Anesthesia Type: General Level of consciousness: awake and alert Pain management: pain level controlled Vital Signs Assessment: post-procedure vital signs reviewed and stable Respiratory status: spontaneous breathing, nonlabored ventilation, respiratory function stable and patient connected to nasal cannula oxygen Cardiovascular status: blood pressure returned to baseline and stable Postop Assessment: no apparent nausea or vomiting Anesthetic complications: no   No notable events documented.  Last Vitals:  Vitals:   11/30/24 0930 11/30/24 0940  BP: (!) 116/54 (!) 111/54  Pulse: (!) 49 (!) 49  Resp: 12 11  Temp:    SpO2: 100% 99%    Last Pain:  Vitals:   11/30/24 0930  TempSrc:   PainSc: 0-No pain                 Rome Ade      "

## 2024-11-30 NOTE — Anesthesia Preprocedure Evaluation (Signed)
"                                    Anesthesia Evaluation  Patient identified by MRN, date of birth, ID band Patient awake    Reviewed: Allergy & Precautions, NPO status , Patient's Chart, lab work & pertinent test results  History of Anesthesia Complications Negative for: history of anesthetic complications  Airway Mallampati: II  TM Distance: >3 FB Neck ROM: Full    Dental  (+) Poor Dentition, Missing   Pulmonary neg pulmonary ROS, neg sleep apnea, neg COPD, Patient abstained from smoking.Not current smoker, former smoker   Pulmonary exam normal breath sounds clear to auscultation       Cardiovascular Exercise Tolerance: Good METShypertension, Pt. on home beta blockers + CAD and +CHF  (-) Past MI + dysrhythmias Atrial Fibrillation  Rhythm:Irregular Rate:Bradycardia - Systolic murmurs    Neuro/Psych negative neurological ROS  negative psych ROS   GI/Hepatic ,neg GERD  ,,(+)     (-) substance abuse    Endo/Other  neg diabetesHypothyroidism    Renal/GU Renal InsufficiencyRenal disease     Musculoskeletal   Abdominal   Peds  Hematology  (+) Blood dyscrasia (Eliquis ), anemia   Anesthesia Other Findings Past Medical History: No date: Arthritis No date: CML (chronic myelocytic leukemia) (HCC)     Comment:  in remission No date: History of kidney stones No date: Hypertension No date: Persistent atrial fibrillation (HCC)     Comment:  Atrial Fibrillation  Reproductive/Obstetrics                              Anesthesia Physical Anesthesia Plan  ASA: 3  Anesthesia Plan: General   Post-op Pain Management: Minimal or no pain anticipated   Induction: Intravenous  PONV Risk Score and Plan: 2 and Ondansetron , Dexamethasone  and Treatment may vary due to age or medical condition  Airway Management Planned: Oral ETT  Additional Equipment: None  Intra-op Plan:   Post-operative Plan: Extubation in OR  Informed  Consent: I have reviewed the patients History and Physical, chart, labs and discussed the procedure including the risks, benefits and alternatives for the proposed anesthesia with the patient or authorized representative who has indicated his/her understanding and acceptance.     Dental advisory given  Plan Discussed with: CRNA and Surgeon  Anesthesia Plan Comments: (Discussed risks of anesthesia with patient, including PONV, sore throat, lip/dental/eye damage. Rare risks discussed as well, such as cardiorespiratory and neurological sequelae, and allergic reactions. Discussed the role of CRNA in patient's perioperative care. Patient understands.)         Anesthesia Quick Evaluation  "

## 2024-11-30 NOTE — Anesthesia Procedure Notes (Signed)
 Procedure Name: Intubation Date/Time: 11/30/2024 8:41 AM  Performed by: Obadiah Reyes BROCKS, CRNAPre-anesthesia Checklist: Patient identified, Emergency Drugs available, Suction available and Patient being monitored Patient Re-evaluated:Patient Re-evaluated prior to induction Oxygen Delivery Method: Circle System Utilized Preoxygenation: Pre-oxygenation with 100% oxygen Induction Type: IV induction Ventilation: Mask ventilation without difficulty Tube type: Oral Tube size: 7.0 mm Number of attempts: 1 Airway Equipment and Method: Stylet and Oral airway Placement Confirmation: ETT inserted through vocal cords under direct vision, positive ETCO2 and breath sounds checked- equal and bilateral Secured at: 23 cm Tube secured with: Tape Dental Injury: Teeth and Oropharynx as per pre-operative assessment

## 2024-11-30 NOTE — Op Note (Signed)
 [facility] Patient Name: Lance Hubbard Procedure Date: 11/30/2024 MRN: [id number] Attending MD: Aloha Finner , MD, [id number] Date of Birth: 03-Apr-1941 CSN: [id number] Age: 84 Admit Type: Outpatient Procedure:                ERCP Indications:              Bile leak, Biliary stent removal, Postop exam:                            Cholecystectomy Providers:                Aloha Finner, MD, Ozell Pouch, [facility]                            [place], Technician, Juliane Crass, CRNA Referring MD:              Medicines:                General Anesthesia, Cipro  400 mg IV, Diclofenac  100                            mg rectal Complications:            No immediate complications. Estimated Blood Loss:     Estimated blood loss was minimal. Estimated blood                            loss: none. Procedure:                Pre-Anesthesia Assessment:                           - Prior to the procedure, a History and Physical                            was performed, and patient medications and                            allergies were reviewed. The patient's tolerance of                            previous anesthesia was also reviewed. The risks                            and benefits of the procedure and the sedation                            options and risks were discussed with the patient.                            All questions were answered, and informed consent                            was obtained. Prior Anticoagulants: The patient has                            taken Eliquis  (apixaban ), last  dose was 2 days                            prior to procedure. ASA Grade Assessment: III - A                            patient with severe systemic disease. After                            reviewing the risks and benefits, the patient was                            deemed in satisfactory condition to undergo the                            procedure.                            After obtaining informed consent, the scope was                            passed under direct vision. Throughout the                            procedure, the patient's blood pressure, pulse, and                            oxygen saturations were monitored continuously. The                            [employer] single use                            duodenoscope was introduced through the mouth, and                            used to inject contrast into and used to inject                            contrast into the bile duct. The ERCP was                            accomplished without difficulty. The patient                            tolerated the procedure. Scope In: Scope Out: Findings:      A scout film of the abdomen was obtained. Surgical clips, consistent       with previous cholecystectomy, were seen in the area of the right upper       quadrant of the abdomen. One stent ending in the main bile duct was seen.      The upper GI tract was traversed under direct vision without detailed       examination. A large amount of food (residue) was found in the entire  examined stomach. A biliary sphincterotomy had been performed. The       sphincterotomy appeared open. One plastic biliary stent originating in       the biliary tree was emerging from the major papilla. The stent was       visibly patent. One stent was removed from the biliary tree using a       snare.      A 0.035 inch x 260 cm straight Hydra Jagwire was passed into the biliary       tree. The Hydratome sphincterotome was passed over the guidewire and the       bile duct was then deeply cannulated. Contrast was injected. I       personally interpreted the bile duct images. Ductal flow of contrast was       adequate. Image quality was adequate. Contrast extended to the hepatic       ducts. Opacification of the entire biliary tree except for the       gallbladder was successful. The  maximum diameter of the ducts was 7 mm.       To discover objects, the biliary tree was swept with a retrieval       balloon. Small amount of sludge was swept from the duct. An occlusion       cholangiogram was performed that showed no further significant biliary       pathology. There was no extravasation of contrast suggesting the leak       was healed/sealed.      A pancreatogram was not performed.      The duodenoscope was withdrawn from the patient. Impression:               - Prior biliary sphincterotomy appeared open.                           - One visibly patent stent from the biliary tree                            was seen in the major papilla. Stent removed.                           - The biliary tree was swept and sludge was found.                           - No evidence on final cholangiogram of further                            biliary leak. Moderate Sedation:      Not Applicable - Patient had care per Anesthesia. Recommendation:           - The patient will be observed post-procedure,                            until all discharge criteria are met.                           - Discharge patient to home.                           - Patient has a  contact number available for                            emergencies. The signs and symptoms of potential                            delayed complications were discussed with the                            patient. Return to normal activities tomorrow.                            Written discharge instructions were provided to the                            patient.                           - Low fat diet.                           - Continue present medications. May restart Eliquis                             this evening.                           - Watch for pancreatitis, bleeding, perforation,                            and cholangitis.                           - The findings and recommendations were discussed                             with the patient.                           - The findings and recommendations were discussed                            with the patient's family. Procedure Code(s):        --- Professional ---                           [phone], Endoscopic retrograde                            cholangiopancreatography (ERCP); with removal of                            foreign body(s) or stent(s) from biliary/pancreatic                            duct(s)  [phone], Endoscopic retrograde                            cholangiopancreatography (ERCP); with removal of                            calculi/debris from biliary/pancreatic duct(s)                           74328, 26, Endoscopic catheterization of the                            biliary ductal system, radiological supervision and                            interpretation Diagnosis Code(s):        --- Professional ---                           Z96.89, Presence of other specified functional                            implants                           Z46.59, Encounter for fitting and adjustment of                            other gastrointestinal appliance and device                           Z09, Encounter for follow-up examination after                            completed treatment for conditions other than                            malignant neoplasm                           Z90.49, Acquired absence of other specified parts                            of digestive tract                           K83.8, Other specified diseases of biliary tract CPT copyright 2022 [employer]. All rights reserved. The codes documented in this report are preliminary and upon coder review may  be revised to meet current compliance requirements. Aloha Finner, MD 11/30/2024 9:09:48 AM Number of Addenda: 0

## 2024-11-30 NOTE — Discharge Instructions (Signed)

## 2024-11-30 NOTE — Telephone Encounter (Signed)
 Appt made for 12/22/24 at 230 pm with Delon Failing PA  Letter mailed and sent to My Chart

## 2024-12-22 ENCOUNTER — Ambulatory Visit: Admitting: Physician Assistant

## 2025-01-08 ENCOUNTER — Ambulatory Visit (HOSPITAL_COMMUNITY): Admitting: Physician Assistant

## 2025-02-08 ENCOUNTER — Ambulatory Visit (HOSPITAL_COMMUNITY)
